# Patient Record
Sex: Male | Born: 1950
Health system: Southern US, Community
[De-identification: ages and names within clinical notes are randomized; demographics above are authoritative.]

## PROBLEM LIST (undated history)

## (undated) DIAGNOSIS — K649 Unspecified hemorrhoids: Secondary | ICD-10-CM

## (undated) DIAGNOSIS — C801 Malignant (primary) neoplasm, unspecified: Secondary | ICD-10-CM

## (undated) DIAGNOSIS — J309 Allergic rhinitis, unspecified: Secondary | ICD-10-CM

## (undated) DIAGNOSIS — K219 Gastro-esophageal reflux disease without esophagitis: Secondary | ICD-10-CM

## (undated) HISTORY — DX: Gastro-esophageal reflux disease without esophagitis: K21.9

## (undated) HISTORY — PX: NO PAST SURGERIES: SHX2092

## (undated) HISTORY — DX: Unspecified hemorrhoids: K64.9

## (undated) HISTORY — DX: Allergic rhinitis, unspecified: J30.9

---

## 2010-09-19 ENCOUNTER — Inpatient Hospital Stay (INDEPENDENT_AMBULATORY_CARE_PROVIDER_SITE_OTHER)
Admission: RE | Admit: 2010-09-19 | Discharge: 2010-09-19 | Disposition: A | Discharge status: Home / Self Care | Payer: BC Managed Care – PPO | Source: Ambulatory Visit | Attending: Family Medicine | Admitting: Family Medicine

## 2010-09-19 DIAGNOSIS — J019 Acute sinusitis, unspecified: Secondary | ICD-10-CM

## 2014-05-02 ENCOUNTER — Emergency Department (HOSPITAL_COMMUNITY)
Admission: EM | Admit: 2014-05-02 | Discharge: 2014-05-02 | Disposition: A | Discharge status: Home / Self Care | Payer: PRIVATE HEALTH INSURANCE | Attending: Emergency Medicine | Admitting: Emergency Medicine

## 2014-05-02 ENCOUNTER — Encounter (HOSPITAL_COMMUNITY): Payer: Self-pay | Admitting: Emergency Medicine

## 2014-05-02 DIAGNOSIS — K648 Other hemorrhoids: Secondary | ICD-10-CM | POA: Insufficient documentation

## 2014-05-02 DIAGNOSIS — K644 Residual hemorrhoidal skin tags: Secondary | ICD-10-CM | POA: Diagnosis not present

## 2014-05-02 DIAGNOSIS — K611 Rectal abscess: Secondary | ICD-10-CM | POA: Diagnosis present

## 2014-05-02 DIAGNOSIS — K6289 Other specified diseases of anus and rectum: Secondary | ICD-10-CM

## 2014-05-02 MED ORDER — HYDROCORTISONE ACETATE 25 MG RE SUPP: 25.0000 mg | Freq: Two times a day (BID) | RECTAL | Status: DC

## 2014-05-02 MED ORDER — HYDROCODONE-ACETAMINOPHEN 5-325 MG PO TABS
1.0000 | ORAL_TABLET | Freq: Once | ORAL | Status: AC
  Administered 2014-05-02: 1 via ORAL
  Filled 2014-05-02: qty 1

## 2014-05-02 MED ORDER — LIDOCAINE HCL 2 % EX GEL
1.0000 "application " | Freq: Once | CUTANEOUS | Status: AC
  Administered 2014-05-02: 1 via TOPICAL
  Filled 2014-05-02: qty 20

## 2014-05-02 MED ORDER — POLYETHYLENE GLYCOL 3350 17 G PO PACK: 17.0000 g | PACK | Freq: Every day | ORAL | Status: DC

## 2014-05-02 MED ORDER — DOCUSATE SODIUM 100 MG PO CAPS: 100.0000 mg | ORAL_CAPSULE | Freq: Two times a day (BID) | ORAL | Status: DC

## 2014-05-02 NOTE — ED Notes (Signed)
Pt c/o abscess near rectum area x 1 week with pain

## 2014-05-02 NOTE — ED Provider Notes (Signed)
CSN: [id number]     Arrival date & time 05/02/14  0903 History   None    Chief Complaint  Patient presents with  . Abscess     (Consider location/radiation/quality/duration/timing/severity/associated sxs/prior Treatment) HPI Comments: Hunter Walker is a 63 y.o. male with no PMHx, who presents to the ED with complaints of rectal "boil" x6 days which has progressively worsened. He reports the pain is 5/10 sharp constant nonradiating pain located on the right aspect of his anus, worse with pressure to the area such as sitting, and improved slightly with Vaseline and peroxide. He reports that last night the pain began to increase, which prompted him to seek help today. He reports that the pain also increases with having bowel movements, and endorses having hard stools with occasional red streaks of blood. Denies any fevers, chills, fluctuance, drainage/weeping, warmth, red streaking of his skin, abdomen pain, nausea, vomiting, diarrhea, melena, known injury to this area, or prior abscess.  Patient is a 63 y.o. male presenting with abscess. The history is provided by the patient. No language interpreter was used.  Abscess Location:  Ano-genital Ano-genital abscess location:  Anus Size:  Quarter Abscess quality: induration and painful   Abscess quality: not draining, no fluctuance, no warmth and not weeping   Red streaking: no   Duration:  6 days Progression:  Worsening Pain details:    Quality:  Sharp   Severity:  Moderate (5/10)   Duration:  6 days   Timing:  Constant   Progression:  Worsening Chronicity:  New Context: not diabetes, not immunosuppression, not insect bite/sting and not skin injury   Relieved by: peroxide and vaseline. Exacerbated by: pressure. Ineffective treatments:  None tried Associated symptoms: no fever, no nausea and no vomiting   Risk factors: no hx of MRSA and no prior abscess     History reviewed. No pertinent past medical history. History reviewed. No  pertinent past surgical history. History reviewed. No pertinent family history. History  Substance Use Topics  . Smoking status: Never Smoker   . Smokeless tobacco: Not on file  . Alcohol Use: Yes     Comment: occ    Review of Systems  Constitutional: Negative for fever and chills.  Gastrointestinal: Positive for constipation, anal bleeding and rectal pain. Negative for nausea, vomiting, abdominal pain, diarrhea and blood in stool.  Genitourinary: Negative for dysuria and testicular pain.  Musculoskeletal: Negative for back pain and neck pain.  Skin:       +"boil"  Neurological: Negative for weakness and numbness.   10 Systems reviewed and are negative for acute change except as noted in the HPI.    Allergies  Review of patient's allergies indicates no known allergies.  Home Medications   Prior to Admission medications   Not on File   BP 142/93 mmHg  Pulse 86  Temp(Src) 98.1 F (36.7 C) (Oral)  Resp 20  Ht 6' (1.829 m)  Wt 170 lb (77.111 kg)  BMI 23.05 kg/m2  SpO2 97% Physical Exam  Constitutional: He is oriented to person, place, and time. Vital signs are normal. He appears well-developed and well-nourished.  Non-toxic appearance. No distress.  Afebrile, nontoxic  HENT:  Head: Normocephalic and atraumatic.  Mouth/Throat: Oropharynx is clear and moist and mucous membranes are normal.  Eyes: Conjunctivae and EOM are normal. Right eye exhibits no discharge. Left eye exhibits no discharge.  Neck: Normal range of motion. Neck supple.  Cardiovascular: Normal rate.   Pulmonary/Chest: Effort normal. No respiratory distress.  Abdominal: Normal appearance. He exhibits no distension.  Genitourinary: Prostate normal. Rectal exam shows external hemorrhoid, internal hemorrhoid and tenderness. Rectal exam shows no fissure, no mass and anal tone normal.     Large external hemorrhoid located to the R side of anal opening, unchanged with valsalva, exquisitely tender to palpation,  without erythema or warmth, no fluctuance, difficult to reduce due to pain. No fissures. Good anal tone. No rectal masses, small internal hemorrhoids palpated which do not prolapse with valsalva. Prostate WNL.  Musculoskeletal: Normal range of motion.  Neurological: He is alert and oriented to person, place, and time. He has normal strength. No sensory deficit.  Skin: Skin is warm, dry and intact. No rash noted.  Psychiatric: He has a normal mood and affect.  Nursing note and vitals reviewed.   ED Course  Procedures (including critical care time) Labs Review Labs Reviewed - No data to display  Imaging Review No results found.   EKG Interpretation None      MDM   Final diagnoses:  External prolapsed hemorrhoids  Anal or rectal pain    63 y.o. male with large external hemorrhoid. Will start on bowel regimen and give anusol. Will have him f/up with [facility] and give surgery f/up in case this area becomes larger or does not resolve with conservative measures. Doubt need for I&D at this time as it does not appear to be thrombosed. Vicodin given in ED for pain control while examining pt. Will have him use sitz baths and anusol for pain medicine at home. I explained the diagnosis and have given explicit precautions to return to the ER including for any other new or worsening symptoms. The patient understands and accepts the medical plan as it's been dictated and I have answered their questions. Discharge instructions concerning home care and prescriptions have been given. The patient is STABLE and is discharged to home in good condition.  BP 142/93 mmHg  Pulse 87  Temp(Src) 98.1 F (36.7 C) (Oral)  Resp 20  Ht 6' (1.829 m)  Wt 170 lb (77.111 kg)  BMI 23.05 kg/m2  SpO2 95%  Meds ordered this encounter  Medications  . HYDROcodone-acetaminophen (NORCO/VICODIN) 5-325 MG per tablet 1 tablet    Sig:   . ibuprofen (ADVIL,MOTRIN) 200 MG tablet    Sig: Take 400 mg by  mouth every 6 (six) hours as needed.  . lidocaine (XYLOCAINE) 2 % jelly 1 application    Sig:   . polyethylene glycol (MIRALAX / GLYCOLAX) packet    Sig: Take 17 g by mouth daily.    Dispense:  14 each    Refill:  0    Order Specific Question:  Supervising Provider    Answer:  Noemi Chapel D [[id number]]  . hydrocortisone (ANUSOL-HC) 25 MG suppository    Sig: Place 1 suppository (25 mg total) rectally 2 (two) times daily. For 10 days or until resolution of symptoms.    Dispense:  20 suppository    Refill:  0    Order Specific Question:  Supervising Provider    Answer:  Noemi Chapel D [[id number]]  . docusate sodium (COLACE) 100 MG capsule    Sig: Take 1 capsule (100 mg total) by mouth every 12 (twelve) hours.    Dispense:  60 capsule    Refill:  0    Order Specific Question:  Supervising Provider    Answer:  Noemi Chapel D [[id number]]       Patty Sermons Camprubi-Soms, PA-C 05/02/14  [place], MD 05/02/14 [phone]

## 2014-05-02 NOTE — ED Notes (Signed)
Pt comfortable with discharge and follow up instructions. Pt declines wheelchair, escorted to waiting area by this RN. Prescriptions x3. 

## 2014-05-02 NOTE — Discharge Instructions (Signed)
Perform sitz baths 4 times daily as directed below. Use miralax and colace to soften your stools. Use anusol as directed to help resolve this hemorrhoid. If this area does not improve within the next 5 days, or if it worsens, call the surgeon's office. If this area becomes warm, red, and draining pus or blood, or you develop high fevers, return to the ER. Follow up with [facility] in 3-5 days for ongoing management of your healthcare, and to follow up on this issue.    Hemorrhoids Hemorrhoids are puffy (swollen) veins around the rectum or anus. Hemorrhoids can cause pain, itching, bleeding, or irritation. HOME CARE  Eat foods with fiber, such as whole grains, beans, nuts, fruits, and vegetables. Ask your doctor about taking products with added fiber in them (fibersupplements).  Drink enough fluid to keep your pee (urine) clear or pale yellow.  Exercise often.  Go to the bathroom when you have the urge to poop. Do not wait.  Avoid straining to poop (bowel movement).  Keep the butt area dry and clean. Use wet toilet paper or moist paper towels.  Medicated creams and medicine inserted into the anus (anal suppository) may be used or applied as told.  Only take medicine as told by your doctor.  Take a warm water bath (sitz bath) for 15-20 minutes to ease pain. Do this 3-4 times a day.  Place ice packs on the area if it is tender or puffy. Use the ice packs between the warm water baths.  Put ice in a plastic bag.  Place a towel between your skin and the bag.  Leave the ice on for 15-20 minutes, 03-04 times a day.  Do not use a donut-shaped pillow or sit on the toilet for a long time. GET HELP RIGHT AWAY IF:   You have more pain that is not controlled by treatment or medicine.  You have bleeding that will not stop.  You have trouble or are unable to poop (bowel movement).  You have pain or puffiness outside the area of the hemorrhoids. MAKE SURE YOU:   Understand  these instructions.  Will watch your condition.  Will get help right away if you are not doing well or get worse. Document Released: 02/27/2008 Document Revised: 05/06/2012 Document Reviewed: 03/31/2012 [facility] Patient Information 2015 [place], [place]. This information is not intended to replace advice given to you by your health care provider. Make sure you discuss any questions you have with your health care provider.

## 2015-03-20 ENCOUNTER — Encounter (HOSPITAL_COMMUNITY): Payer: Self-pay | Admitting: *Deleted

## 2015-03-20 ENCOUNTER — Emergency Department (HOSPITAL_COMMUNITY)
Admission: EM | Admit: 2015-03-20 | Discharge: 2015-03-20 | Disposition: A | Discharge status: Home / Self Care | Payer: PRIVATE HEALTH INSURANCE | Attending: Emergency Medicine | Admitting: Emergency Medicine

## 2015-03-20 DIAGNOSIS — K6289 Other specified diseases of anus and rectum: Secondary | ICD-10-CM | POA: Diagnosis present

## 2015-03-20 DIAGNOSIS — K644 Residual hemorrhoidal skin tags: Secondary | ICD-10-CM | POA: Insufficient documentation

## 2015-03-20 DIAGNOSIS — Z79899 Other long term (current) drug therapy: Secondary | ICD-10-CM | POA: Diagnosis not present

## 2015-03-20 DIAGNOSIS — Z87891 Personal history of nicotine dependence: Secondary | ICD-10-CM | POA: Diagnosis not present

## 2015-03-20 DIAGNOSIS — K5900 Constipation, unspecified: Secondary | ICD-10-CM | POA: Diagnosis not present

## 2015-03-20 MED ORDER — HYDROCORTISONE 2.5 % RE CREA: TOPICAL_CREAM | RECTAL | Status: DC

## 2015-03-20 MED ORDER — DOCUSATE SODIUM 100 MG PO CAPS: 100.0000 mg | ORAL_CAPSULE | Freq: Two times a day (BID) | ORAL | Status: DC

## 2015-03-20 NOTE — ED Provider Notes (Signed)
CSN: [id number]     Arrival date & time 03/20/15  0740 History   First MD Initiated Contact with Patient 03/20/15 0759     Chief Complaint  Patient presents with  . Hemorrhoids     (Consider location/radiation/quality/duration/timing/severity/associated sxs/prior Treatment) HPI Comments: 64 year old male with history of hemorrhoids who presents with hemorrhoid pain. Patient presented here 1 year ago with hemorrhoid pain and has not had problems since approximately one week ago, when he began having rectal pain associated with bowel movements and straining and worse when he sits down. He has felt a "bump" near his anus. He reports some blood on the toilet paper after bowel movements. 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 He does sit a lot while driving to and from work. He has some generalized abdominal discomfort related to the constipation. No fevers or weight loss. He has taken ibuprofen 1 time, MiraLAX once, and used Preparation H without relief.  The history is provided by the patient.    History reviewed. No pertinent past medical history. History reviewed. No pertinent past surgical history. History reviewed. No pertinent family history. Social History  Substance Use Topics  . Smoking status: Former Smoker -- 1.00 packs/day for 10 years    Types: Cigarettes  . Smokeless tobacco: None  . Alcohol Use: Yes     Comment: occ    Review of Systems 10 Systems reviewed and are negative for acute change except as noted in the HPI.    Allergies  Review of patient's allergies indicates no known allergies.  Home Medications   Prior to Admission medications   Medication Sig Start Date End Date Taking? Authorizing Provider  ibuprofen (ADVIL,MOTRIN) 200 MG tablet Take 400 mg by mouth every 8 (eight) hours as needed for fever, headache, mild pain, moderate pain or cramping.    Yes Historical Provider, MD   phenylephrine-shark liver oil-mineral oil-petrolatum (PREPARATION H) 0.25-3-14-71.9 % rectal ointment Place 1 application rectally 2 (two) times daily as needed for hemorrhoids.   Yes Historical Provider, MD  polyethylene glycol (MIRALAX / GLYCOLAX) packet Take 17 g by mouth daily. Patient taking differently: Take 17 g by mouth daily as needed for mild constipation.  05/02/14  Yes Mercedes Camprubi-Soms, PA-C  shark liver oil-cocoa butter (PREPARATION H) 0.25-3-85.5 % suppository Place 1 suppository rectally as needed for hemorrhoids.   Yes Historical Provider, MD  docusate sodium (COLACE) 100 MG capsule Take 1 capsule (100 mg total) by mouth every 12 (twelve) hours. 03/20/15   Sharlett Iles, MD  hydrocortisone (ANUSOL-HC) 2.5 % rectal cream Apply small amount of medication on hemorrhoid 2-4 times daily as needed for comfort 03/20/15   Wenda Overland Ilee Randleman, MD   BP 138/85 mmHg  Pulse 78  Temp(Src) 97.7 F (36.5 C) (Oral)  Resp 16  SpO2 99% Physical Exam  Constitutional: He is oriented to person, place, and time. He appears well-developed and well-nourished. No distress.  HENT:  Head: Normocephalic and atraumatic.  Moist mucous membranes  Eyes: Conjunctivae are normal. Pupils are equal, round, and reactive to light.  Neck: Neck supple.  Cardiovascular: Normal rate, regular rhythm and normal heart sounds.   No murmur heard. Pulmonary/Chest: Effort normal and breath sounds normal.  Abdominal: Soft. Bowel sounds are normal. He exhibits no distension. There is no tenderness.  Genitourinary:  1cm non-thrombosed, non-bleeding external hemorroid  Musculoskeletal: He exhibits no edema.  Neurological: He is alert and  oriented to person, place, and time.  Fluent speech  Skin: Skin is warm and dry.  Psychiatric: He has a normal mood and affect. Judgment normal.  Nursing note and vitals reviewed.  Chaperone was present during exam.  ED Course  Procedures (including critical care  time) Labs Review Labs Reviewed - No data to display   MDM   Final diagnoses:  External hemorrhoids  Constipation, unspecified constipation type   64 year old male with history of hemorrhoids and constipation who presents with 1 week of worsening hemorrhoid pain associated with straining and worse with sitting down. Patient well-appearing with no abdominal tenderness on exam. He had a small, nonthrombosed, nonbleeding external hemorrhoid. Instructed on supportive care including MiraLAX and Colace daily, use of donut pillow, and use of anusol, tucks pads for symptom relief. Also instructed on fiber and increasing water intake. Instructed to establish care with a primary care provider for follow-up if any complications or ongoing symptoms. Patient voiced understanding and was discharged in satisfactory condition.  Sharlett Iles, MD 03/20/15 [phone]

## 2015-03-20 NOTE — Discharge Instructions (Signed)

## 2015-03-20 NOTE — ED Notes (Addendum)
Pt pt reports bump near rectum. Was seen last Nov 2015 for same complaint at [facility], was told he had a hemorrhoid. Reports increased pain x1 week, sometimes blood on toilet paper after bowel movement and straining. Pain 7/10.

## 2015-05-15 ENCOUNTER — Emergency Department (HOSPITAL_COMMUNITY)
Admission: EM | Admit: 2015-05-15 | Discharge: 2015-05-15 | Disposition: A | Discharge status: Home / Self Care | Payer: PRIVATE HEALTH INSURANCE | Attending: Emergency Medicine | Admitting: Emergency Medicine

## 2015-05-15 ENCOUNTER — Emergency Department (HOSPITAL_COMMUNITY): Payer: PRIVATE HEALTH INSURANCE

## 2015-05-15 ENCOUNTER — Encounter (HOSPITAL_COMMUNITY): Payer: Self-pay

## 2015-05-15 DIAGNOSIS — R112 Nausea with vomiting, unspecified: Secondary | ICD-10-CM | POA: Insufficient documentation

## 2015-05-15 DIAGNOSIS — R197 Diarrhea, unspecified: Secondary | ICD-10-CM | POA: Insufficient documentation

## 2015-05-15 DIAGNOSIS — K6389 Other specified diseases of intestine: Secondary | ICD-10-CM

## 2015-05-15 DIAGNOSIS — Z79899 Other long term (current) drug therapy: Secondary | ICD-10-CM | POA: Insufficient documentation

## 2015-05-15 DIAGNOSIS — R1110 Vomiting, unspecified: Secondary | ICD-10-CM

## 2015-05-15 DIAGNOSIS — Z87891 Personal history of nicotine dependence: Secondary | ICD-10-CM | POA: Insufficient documentation

## 2015-05-15 LAB — CBC
HEMATOCRIT: 36.8 % — AB (ref 39.0–52.0)
HEMOGLOBIN: 11.6 g/dL — AB (ref 13.0–17.0)
MCH: 27.1 pg (ref 26.0–34.0)
MCHC: 31.5 g/dL (ref 30.0–36.0)
MCV: 86 fL (ref 78.0–100.0)
Platelets: 304 10*3/uL (ref 150–400)
RBC: 4.28 MIL/uL (ref 4.22–5.81)
RDW: 15.3 % (ref 11.5–15.5)
WBC: 5.6 10*3/uL (ref 4.0–10.5)

## 2015-05-15 LAB — URINALYSIS, ROUTINE W REFLEX MICROSCOPIC
Bilirubin Urine: NEGATIVE
Glucose, UA: NEGATIVE mg/dL
Hgb urine dipstick: NEGATIVE
Ketones, ur: NEGATIVE mg/dL
LEUKOCYTES UA: NEGATIVE
Nitrite: NEGATIVE
PH: 5.5 (ref 5.0–8.0)
Protein, ur: NEGATIVE mg/dL
Specific Gravity, Urine: 1.02 (ref 1.005–1.030)

## 2015-05-15 LAB — COMPREHENSIVE METABOLIC PANEL
ALT: 15 U/L — ABNORMAL LOW (ref 17–63)
AST: 22 U/L (ref 15–41)
Albumin: 3.8 g/dL (ref 3.5–5.0)
Alkaline Phosphatase: 62 U/L (ref 38–126)
Anion gap: 6 (ref 5–15)
BUN: 16 mg/dL (ref 6–20)
CHLORIDE: 101 mmol/L (ref 101–111)
CO2: 29 mmol/L (ref 22–32)
Calcium: 8.7 mg/dL — ABNORMAL LOW (ref 8.9–10.3)
Creatinine, Ser: 1.28 mg/dL — ABNORMAL HIGH (ref 0.61–1.24)
GFR calc Af Amer: >60 mL/min (ref >60)
GFR, EST NON AFRICAN AMERICAN: 58 mL/min — AB (ref >60)
Glucose, Bld: 119 mg/dL — ABNORMAL HIGH (ref 65–99)
POTASSIUM: 4.1 mmol/L (ref 3.5–5.1)
Sodium: 136 mmol/L (ref 135–145)
Total Bilirubin: 0.5 mg/dL (ref 0.3–1.2)
Total Protein: 7.7 g/dL (ref 6.5–8.1)

## 2015-05-15 LAB — LIPASE, BLOOD: LIPASE: 21 U/L (ref 11–51)

## 2015-05-15 LAB — MAGNESIUM: Magnesium: 2.2 mg/dL (ref 1.7–2.4)

## 2015-05-15 MED ORDER — SODIUM CHLORIDE 0.9 % IV BOLUS (SEPSIS)
1000.0000 mL | Freq: Once | INTRAVENOUS | Status: AC
  Administered 2015-05-15: 1000 mL via INTRAVENOUS

## 2015-05-15 MED ORDER — IOHEXOL 300 MG/ML  SOLN
50.0000 mL | Freq: Once | INTRAMUSCULAR | Status: AC | PRN
  Administered 2015-05-15: 50 mL via ORAL

## 2015-05-15 MED ORDER — IOHEXOL 300 MG/ML  SOLN
100.0000 mL | Freq: Once | INTRAMUSCULAR | Status: AC | PRN
  Administered 2015-05-15: 100 mL via INTRAVENOUS

## 2015-05-15 MED ORDER — LIDOCAINE VISCOUS 2 % MT SOLN
15.0000 mL | Freq: Once | OROMUCOSAL | Status: AC
  Administered 2015-05-15: 15 mL via OROMUCOSAL
  Filled 2015-05-15: qty 15

## 2015-05-15 MED ORDER — ALUM & MAG HYDROXIDE-SIMETH 200-200-20 MG/5ML PO SUSP
15.0000 mL | Freq: Once | ORAL | Status: AC
  Administered 2015-05-15: 15 mL via ORAL
  Filled 2015-05-15: qty 30

## 2015-05-15 MED ORDER — ONDANSETRON 4 MG PO TBDP: ORAL_TABLET | ORAL | Status: DC

## 2015-05-15 MED ORDER — ONDANSETRON HCL 4 MG/2ML IJ SOLN
4.0000 mg | Freq: Once | INTRAMUSCULAR | Status: AC
Start: 2015-05-15 — End: 2015-05-15
  Administered 2015-05-15: 4 mg via INTRAVENOUS
  Filled 2015-05-15: qty 2

## 2015-05-15 NOTE — Discharge Instructions (Signed)
Follow up with your PCP.  Return for inability to eat or drink, persistent fever >5 days, sudden worsening abdominal pain. ° °

## 2015-05-15 NOTE — ED Provider Notes (Signed)
CSN: [id number]     Arrival date & time 05/15/15  [id number] History   First MD Initiated Contact with Patient 05/15/15 0914     Chief Complaint  Patient presents with  . Emesis  . Diarrhea     (Consider location/radiation/quality/duration/timing/severity/associated sxs/prior Treatment) Patient is a 64 y.o. male presenting with general illness. The history is provided by the patient.  Illness Severity:  Moderate Onset quality:  Gradual Duration:  2 days Timing:  Constant Progression:  Worsening Chronicity:  New Associated symptoms: diarrhea, nausea and vomiting   Associated symptoms: no abdominal pain, no chest pain, no congestion, no fever, no headaches, no myalgias, no rash and no shortness of breath    64 yo M with a cc of n/v/d.  This started about two days ago.  Wife recently diagnosed with norovirus.  Patient having similar symptoms. Denies fever, chills.  Able to eat and drink without difficulty.  History reviewed. No pertinent past medical history. History reviewed. No pertinent past surgical history. History reviewed. No pertinent family history. Social History  Substance Use Topics  . Smoking status: Former Smoker -- 1.00 packs/day for 10 years    Types: Cigarettes  . Smokeless tobacco: None  . Alcohol Use: Yes     Comment: occ    Review of Systems  Constitutional: Negative for fever and chills.  HENT: Negative for congestion and facial swelling.   Eyes: Negative for discharge and visual disturbance.  Respiratory: Negative for shortness of breath.   Cardiovascular: Negative for chest pain and palpitations.  Gastrointestinal: Positive for nausea, vomiting and diarrhea. Negative for abdominal pain.  Musculoskeletal: Negative for myalgias and arthralgias.  Skin: Negative for color change and rash.  Neurological: Negative for tremors, syncope and headaches.  Psychiatric/Behavioral: Negative for confusion and dysphoric mood.      Allergies  Review of patient's  allergies indicates no known allergies.  Home Medications   Prior to Admission medications   Medication Sig Start Date End Date Taking? Authorizing Provider  docusate sodium (COLACE) 100 MG capsule Take 1 capsule (100 mg total) by mouth every 12 (twelve) hours. 03/20/15  Yes Sharlett Iles, MD  hydrocortisone (ANUSOL-HC) 2.5 % rectal cream Apply small amount of medication on hemorrhoid 2-4 times daily as needed for comfort 03/20/15  Yes Sharlett Iles, MD  ibuprofen (ADVIL,MOTRIN) 200 MG tablet Take 400 mg by mouth every 8 (eight) hours as needed for fever, headache, mild pain, moderate pain or cramping.    Yes Historical Provider, MD  ondansetron (ZOFRAN ODT) 4 MG disintegrating tablet 4mg  ODT q4 hours prn nausea/vomit 05/15/15   Deno Etienne, DO  polyethylene glycol (MIRALAX / GLYCOLAX) packet Take 17 g by mouth daily. Patient not taking: Reported on 05/15/2015 05/02/14   Mercedes Camprubi-Soms, PA-C   BP 114/87 mmHg  Pulse 83  Temp(Src) 98.5 F (36.9 C) (Oral)  Resp 18  Ht 6' (1.829 m)  Wt 182 lb (82.555 kg)  BMI 24.68 kg/m2  SpO2 98% Physical Exam  Constitutional: He is oriented to person, place, and time. He appears well-developed and well-nourished.  HENT:  Head: Normocephalic and atraumatic.  Eyes: EOM are normal. Pupils are equal, round, and reactive to light.  Neck: Normal range of motion. Neck supple. No JVD present.  Cardiovascular: Normal rate and regular rhythm.  Exam reveals no gallop and no friction rub.   No murmur heard. Pulmonary/Chest: No respiratory distress. He has no wheezes.  Abdominal: He exhibits no distension. There is no tenderness. There is  no rebound and no guarding.  Benign abdomen  Musculoskeletal: Normal range of motion.  Neurological: He is alert and oriented to person, place, and time.  Skin: No rash noted. No pallor.  Psychiatric: He has a normal mood and affect. His behavior is normal.  Nursing note and vitals reviewed.   ED Course   Procedures (including critical care time) Labs Review Labs Reviewed  COMPREHENSIVE METABOLIC PANEL - Abnormal; Notable for the following:    Glucose, Bld 119 (*)    Creatinine, Ser 1.28 (*)    Calcium 8.7 (*)    ALT 15 (*)    GFR calc non Af Amer 58 (*)    All other components within normal limits  CBC - Abnormal; Notable for the following:    Hemoglobin 11.6 (*)    HCT 36.8 (*)    All other components within normal limits  LIPASE, BLOOD  URINALYSIS, ROUTINE W REFLEX MICROSCOPIC (NOT AT [facility])  MAGNESIUM    Imaging Review Ct Abdomen Pelvis W Contrast  05/15/2015  CLINICAL DATA:  Mid abdominal pain with nausea, vomiting and diarrhea since last Saturday night. EXAM: CT ABDOMEN AND PELVIS WITH CONTRAST TECHNIQUE: Multidetector CT imaging of the abdomen and pelvis was performed using the standard protocol following bolus administration of intravenous contrast. CONTRAST:  15mL OMNIPAQUE IOHEXOL 300 MG/ML  SOLN COMPARISON:  None. FINDINGS: Lower chest: Mild dependent atelectasis at both lung bases. No confluent airspace opacity, pleural or pericardial effusion. Hepatobiliary: 6 mm low-density lesion posteriorly in the right hepatic lobe on image number 18 is likely a cyst, but too small to optimally characterize. No suspicious hepatic findings. No evidence of gallstones, gallbladder wall thickening or biliary dilatation. Pancreas: The pancreatic duct appears mildly prominent, measuring up to 5 mm in diameter. No evidence of pancreatic mass, surrounding inflammation or pancreas divisum. Spleen: Normal in size without focal abnormality. Adrenals/Urinary Tract: Both adrenal glands appear normal. The kidneys appear normal without evidence of urinary tract calculus, suspicious lesion or hydronephrosis. No bladder abnormalities are seen. Stomach/Bowel: The stomach, small bowel and proximal colon demonstrate no abnormalities. The appendix appears normal. There is irregular wall thickening at the  rectosigmoid junction extending over approximately 6 cm. There is no surrounding inflammatory change, and this finding is worrisome for infiltrating tumor. At least 1 prominent perirectal lymph node is noted, measuring 6 mm on image 76. Vascular/Lymphatic: As above, there is a prominent perirectal lymph node on the left. No other enlarged abdominal pelvic lymph nodes identified. There is mild atherosclerosis of the aorta, its branches and the iliac arteries. Reproductive: The prostate gland is moderately enlarged, measuring up to 6.2 x 4.7 cm transverse. Other: Small umbilical hernia containing only fat. Musculoskeletal: No acute or significant osseous findings. No evidence of metastatic disease. There are mild lumbar spine degenerative changes. IMPRESSION: 1. There is concern of an infiltrating mass involving the rectosigmoid colon. This has appearance more worrisome for tumor (colon cancer) than focal colitis. Sigmoidoscopy recommended. 2. Single adjacent prominent left perirectal lymph node. No distant metastases identified. No evidence of bowel obstruction. 3. Mild prominence of the pancreatic duct, possibly postinflammatory. No other pancreatic abnormalities identified. 4. Mild atherosclerosis. Electronically Signed   By: Richardean Sale M.D.   On: 05/15/2015 12:58   I have personally reviewed and evaluated these images and lab results as part of my medical decision-making.   EKG Interpretation None      MDM   Final diagnoses:  Colonic mass  Vomiting and diarrhea  64 yo M with cc of n/v/d.  Likely norovirus based on hx.  Patient feels that something must be wrong and is requesting a CT scan. Labs unremarkable.  CT scan with concern for colon ca.  Given follow up for GI.     I have discussed the diagnosis/risks/treatment options with the patient and believe the pt to be eligible for discharge home to follow-up with GI. We also discussed returning to the ED immediately if new or worsening sx  occur. We discussed the sx which are most concerning (e.g., sudden worsening pain, fever, inability to tolerate by mouth) that necessitate immediate return. Medications administered to the patient during their visit and any new prescriptions provided to the patient are listed below.  Medications given during this visit Medications  sodium chloride 0.9 % bolus 1,000 mL (0 mLs Intravenous Stopped 05/15/15 1214)  ondansetron (ZOFRAN) injection 4 mg (4 mg Intravenous Given 05/15/15 1014)  alum & mag hydroxide-simeth (MAALOX/MYLANTA) 200-200-20 MG/5ML suspension 15 mL (15 mLs Oral Given 05/15/15 1017)  lidocaine (XYLOCAINE) 2 % viscous mouth solution 15 mL (15 mLs Mouth/Throat Given 05/15/15 1018)  iohexol (OMNIPAQUE) 300 MG/ML solution 50 mL (50 mLs Oral Contrast Given 05/15/15 0945)  iohexol (OMNIPAQUE) 300 MG/ML solution 100 mL (100 mLs Intravenous Contrast Given 05/15/15 1225)    Discharge Medication List as of 05/15/2015  1:31 PM    START taking these medications   Details  ondansetron (ZOFRAN ODT) 4 MG disintegrating tablet 4mg  ODT q4 hours prn nausea/vomit, Print        The patient appears reasonably screen and/or stabilized for discharge and I doubt any other medical condition or other EMC requiring further screening, evaluation, or treatment in the ED at this time prior to discharge.      Deno Etienne, DO 05/15/15 2024

## 2015-05-15 NOTE — ED Notes (Signed)
Patient transported to CT 

## 2015-05-15 NOTE — ED Notes (Signed)
Pt with n/v/d since last Saturday night.  Pt wife in hospital for stomach virus.  Pt feels nauseated, weak, hot/cold.  Unknown for fever.

## 2015-05-16 ENCOUNTER — Telehealth: Payer: Self-pay

## 2015-05-16 ENCOUNTER — Encounter (HOSPITAL_COMMUNITY): Payer: Self-pay | Admitting: *Deleted

## 2015-05-16 ENCOUNTER — Emergency Department (HOSPITAL_COMMUNITY)
Admission: EM | Admit: 2015-05-16 | Discharge: 2015-05-16 | Disposition: A | Discharge status: Home / Self Care | Payer: PRIVATE HEALTH INSURANCE | Attending: Emergency Medicine | Admitting: Emergency Medicine

## 2015-05-16 DIAGNOSIS — K6389 Other specified diseases of intestine: Secondary | ICD-10-CM | POA: Diagnosis not present

## 2015-05-16 DIAGNOSIS — R1013 Epigastric pain: Secondary | ICD-10-CM

## 2015-05-16 DIAGNOSIS — Z79899 Other long term (current) drug therapy: Secondary | ICD-10-CM | POA: Insufficient documentation

## 2015-05-16 DIAGNOSIS — Z87891 Personal history of nicotine dependence: Secondary | ICD-10-CM | POA: Diagnosis not present

## 2015-05-16 LAB — COMPREHENSIVE METABOLIC PANEL
ALBUMIN: 3.7 g/dL (ref 3.5–5.0)
ALK PHOS: 58 U/L (ref 38–126)
ALT: 14 U/L — ABNORMAL LOW (ref 17–63)
AST: 24 U/L (ref 15–41)
Anion gap: 9 (ref 5–15)
BILIRUBIN TOTAL: 0.4 mg/dL (ref 0.3–1.2)
BUN: 13 mg/dL (ref 6–20)
CO2: 26 mmol/L (ref 22–32)
Calcium: 8.8 mg/dL — ABNORMAL LOW (ref 8.9–10.3)
Chloride: 103 mmol/L (ref 101–111)
Creatinine, Ser: 1.19 mg/dL (ref 0.61–1.24)
Glucose, Bld: 100 mg/dL — ABNORMAL HIGH (ref 65–99)
Potassium: 3.5 mmol/L (ref 3.5–5.1)
Sodium: 138 mmol/L (ref 135–145)
Total Protein: 7.5 g/dL (ref 6.5–8.1)

## 2015-05-16 LAB — CBC WITH DIFFERENTIAL/PLATELET
BASOS ABS: 0 10*3/uL (ref 0.0–0.1)
Basophils Relative: 0 %
Eosinophils Absolute: 0.1 10*3/uL (ref 0.0–0.7)
Eosinophils Relative: 2 %
HEMATOCRIT: 35.2 % — AB (ref 39.0–52.0)
Hemoglobin: 11.1 g/dL — ABNORMAL LOW (ref 13.0–17.0)
LYMPHS PCT: 33 %
Lymphs Abs: 1.6 10*3/uL (ref 0.7–4.0)
MCH: 27.5 pg (ref 26.0–34.0)
MCHC: 31.5 g/dL (ref 30.0–36.0)
MCV: 87.1 fL (ref 78.0–100.0)
MONO ABS: 0.4 10*3/uL (ref 0.1–1.0)
Monocytes Relative: 9 %
NEUTROS ABS: 2.8 10*3/uL (ref 1.7–7.7)
Neutrophils Relative %: 56 %
Platelets: 277 10*3/uL (ref 150–400)
RBC: 4.04 MIL/uL — ABNORMAL LOW (ref 4.22–5.81)
RDW: 15.1 % (ref 11.5–15.5)
WBC: 5 10*3/uL (ref 4.0–10.5)

## 2015-05-16 LAB — LIPASE, BLOOD: LIPASE: 22 U/L (ref 11–51)

## 2015-05-16 MED ORDER — SUCRALFATE 1 G PO TABS
1.0000 g | ORAL_TABLET | Freq: Once | ORAL | Status: AC
  Administered 2015-05-16: 1 g via ORAL
  Filled 2015-05-16: qty 1

## 2015-05-16 MED ORDER — SODIUM CHLORIDE 0.9 % IV BOLUS (SEPSIS): 1000.0000 mL | Freq: Once | INTRAVENOUS | Status: DC

## 2015-05-16 MED ORDER — SUCRALFATE 1 GM/10ML PO SUSP: 1.0000 g | Freq: Three times a day (TID) | ORAL | Status: DC

## 2015-05-16 MED ORDER — ONDANSETRON HCL 4 MG/2ML IJ SOLN
4.0000 mg | Freq: Once | INTRAMUSCULAR | Status: DC
  Filled 2015-05-16: qty 2

## 2015-05-16 MED ORDER — FAMOTIDINE 20 MG PO TABS
20.0000 mg | ORAL_TABLET | Freq: Once | ORAL | Status: AC
  Administered 2015-05-16: 20 mg via ORAL
  Filled 2015-05-16: qty 1

## 2015-05-16 MED ORDER — OXYCODONE-ACETAMINOPHEN 5-325 MG PO TABS: ORAL_TABLET | ORAL | Status: DC

## 2015-05-16 MED ORDER — ALUM & MAG HYDROXIDE-SIMETH 200-200-20 MG/5ML PO SUSP
15.0000 mL | Freq: Once | ORAL | Status: DC
  Filled 2015-05-16: qty 30

## 2015-05-16 MED ORDER — FAMOTIDINE 20 MG PO TABS: 20.0000 mg | ORAL_TABLET | Freq: Two times a day (BID) | ORAL | Status: DC

## 2015-05-16 MED ORDER — OXYCODONE-ACETAMINOPHEN 5-325 MG PO TABS
2.0000 | ORAL_TABLET | Freq: Once | ORAL | Status: AC
  Administered 2015-05-16: 2 via ORAL
  Filled 2015-05-16: qty 2

## 2015-05-16 NOTE — ED Provider Notes (Signed)
MSE was initiated and I personally evaluated the patient and placed orders (if any) at  2:53 PM on May 16, 2015.  The patient appears stable so that the remainder of the MSE may be completed by another provider.  Ongoing n/v/d. Will hydrate and provide zofran IV at this time. Well appearing,. nontoxic  Jola Schmidt, MD 05/16/15 1453

## 2015-05-16 NOTE — Telephone Encounter (Signed)
Left message for patient to call back  

## 2015-05-16 NOTE — Telephone Encounter (Signed)
-----   Message from Gatha Mayer, MD sent at 05/16/2015  9:40 AM EST ----- Regarding: needs appt soon Seen in ED 12/12 and had a sigmoid mass on CT so was advised f/u US   Please contact him and arrange an appt unless he is getting one somewhere else  Perhaps APP can see this week or next  I am willing to scope him at hosp Friday if that is needed

## 2015-05-16 NOTE — ED Notes (Addendum)
Pt complains of left upper quadrant abdominal pain, nausea, vomiting since Saturday. Pt was seen yesterday at the ED. Pt states he called to follow up with GI specialist, has appointment tomorrow at [facility], but pt states he could not wait due to pain. Pt states he took TUMs last night, which provided some relief.

## 2015-05-16 NOTE — Progress Notes (Addendum)
Pt confirmed with Cm he does not have apcp but has gateway health coverage  C/o abdominal pain  [facility] ED CM noted pt with coverage but no pcp listed Spoke with pt who confirms no pcp  [facility] ED CM spoke with pt on how to obtain an in network pcp with insurance coverage via the customer service number or web site  Cm reviewed ED level of care for crisis/emergent services and community pcp level of care to manage continuous or chronic medical concerns.  The pt voiced understanding CM encouraged pt and discussed pt's responsibility to verify with pt's insurance carrier that any recommended medical provider offered by any emergency room or a hospital provider is within the carrier's network. The pt voiced understanding   Pt given a 9 page list of in network providers for gateway health for zip code [phone] to assist him with finding a MD for f/u care     Entered in EPIC  please use the list of in network providers given to you for gateway health 9 page list of gateway health providers

## 2015-05-16 NOTE — ED Provider Notes (Signed)
CSN: [id number]     Arrival date & time 05/16/15  1249 History   First MD Initiated Contact with Patient 05/16/15 1450     Chief Complaint  Patient presents with  . Abdominal Pain  . Nausea     (Consider location/radiation/quality/duration/timing/severity/associated sxs/prior Treatment) HPI   Blood pressure 127/87, pulse 69, temperature 98.4 F (36.9 C), temperature source Oral, resp. rate 18, SpO2 100 %.  Hunter Walker is a 64 y.o. male complaining of 6 out of 10 epigastric pain with associated bloody diarrhea. Patient was seen for nausea vomiting diarrhea yesterday, his wife and sister-in-law are sick with similar. Unfortunately, CT showed rectal enteritis concerning for cancer. Patient has a preop appointment with [facility] gastroenterology tomorrow. He returns to the ED due to uncontrolled pain. He took a little bit of ibuprofen this morning with no relief. Emesis has resolved. He denies fever, chills, lightheadedness, syncope, chest pain, shortness of breath.  History reviewed. No pertinent past medical history. History reviewed. No pertinent past surgical history. No family history on file. Social History  Substance Use Topics  . Smoking status: Former Smoker -- 1.00 packs/day for 10 years    Types: Cigarettes  . Smokeless tobacco: None  . Alcohol Use: Yes     Comment: occ    Review of Systems  10 systems reviewed and found to be negative, except as noted in the HPI.   Allergies  Review of patient's allergies indicates no known allergies.  Home Medications   Prior to Admission medications   Medication Sig Start Date End Date Taking? Authorizing Provider  docusate sodium (COLACE) 100 MG capsule Take 1 capsule (100 mg total) by mouth every 12 (twelve) hours. 03/20/15  Yes Sharlett Iles, MD  hydrocortisone (ANUSOL-HC) 2.5 % rectal cream Apply small amount of medication on hemorrhoid 2-4 times daily as needed for comfort 03/20/15  Yes Sharlett Iles, MD   ibuprofen (ADVIL,MOTRIN) 200 MG tablet Take 400 mg by mouth every 8 (eight) hours as needed for fever, headache, mild pain, moderate pain or cramping.    Yes Historical Provider, MD  ondansetron (ZOFRAN ODT) 4 MG disintegrating tablet 4mg  ODT q4 hours prn nausea/vomit 05/15/15  Yes Deno Etienne, DO  polyethylene glycol (MIRALAX / GLYCOLAX) packet Take 17 g by mouth daily. Patient not taking: Reported on 05/15/2015 05/02/14   Mercedes Camprubi-Soms, PA-C   BP 127/87 mmHg  Pulse 69  Temp(Src) 98.4 F (36.9 C) (Oral)  Resp 18  SpO2 100% Physical Exam  Constitutional: He is oriented to person, place, and time. He appears well-developed and well-nourished. No distress.  HENT:  Head: Normocephalic and atraumatic.  Mouth/Throat: Oropharynx is clear and moist.  Eyes: Conjunctivae and EOM are normal. Pupils are equal, round, and reactive to light.  Neck: Normal range of motion.  Cardiovascular: Normal rate, regular rhythm and intact distal pulses.   Pulmonary/Chest: Effort normal and breath sounds normal.  Abdominal: Soft. He exhibits no distension and no mass. There is tenderness. There is no rebound and no guarding.  Mild epigastric tenderness palpation with no guarding or rebound.  Musculoskeletal: Normal range of motion.  Neurological: He is alert and oriented to person, place, and time.  Skin: He is not diaphoretic.  Psychiatric: He has a normal mood and affect.  Nursing note and vitals reviewed.   ED Course  Procedures (including critical care time) Labs Review Labs Reviewed  CBC WITH DIFFERENTIAL/PLATELET - Abnormal; Notable for the following:    RBC 4.04 (*)  Hemoglobin 11.1 (*)    HCT 35.2 (*)    All other components within normal limits  COMPREHENSIVE METABOLIC PANEL - Abnormal; Notable for the following:    Glucose, Bld 100 (*)    Calcium 8.8 (*)    ALT 14 (*)    All other components within normal limits  LIPASE, BLOOD  URINALYSIS, ROUTINE W REFLEX MICROSCOPIC (NOT AT  [facility])    Imaging Review Ct Abdomen Pelvis W Contrast  05/15/2015  CLINICAL DATA:  Mid abdominal pain with nausea, vomiting and diarrhea since last Saturday night. EXAM: CT ABDOMEN AND PELVIS WITH CONTRAST TECHNIQUE: Multidetector CT imaging of the abdomen and pelvis was performed using the standard protocol following bolus administration of intravenous contrast. CONTRAST:  126mL OMNIPAQUE IOHEXOL 300 MG/ML  SOLN COMPARISON:  None. FINDINGS: Lower chest: Mild dependent atelectasis at both lung bases. No confluent airspace opacity, pleural or pericardial effusion. Hepatobiliary: 6 mm low-density lesion posteriorly in the right hepatic lobe on image number 18 is likely a cyst, but too small to optimally characterize. No suspicious hepatic findings. No evidence of gallstones, gallbladder wall thickening or biliary dilatation. Pancreas: The pancreatic duct appears mildly prominent, measuring up to 5 mm in diameter. No evidence of pancreatic mass, surrounding inflammation or pancreas divisum. Spleen: Normal in size without focal abnormality. Adrenals/Urinary Tract: Both adrenal glands appear normal. The kidneys appear normal without evidence of urinary tract calculus, suspicious lesion or hydronephrosis. No bladder abnormalities are seen. Stomach/Bowel: The stomach, small bowel and proximal colon demonstrate no abnormalities. The appendix appears normal. There is irregular wall thickening at the rectosigmoid junction extending over approximately 6 cm. There is no surrounding inflammatory change, and this finding is worrisome for infiltrating tumor. At least 1 prominent perirectal lymph node is noted, measuring 6 mm on image 76. Vascular/Lymphatic: As above, there is a prominent perirectal lymph node on the left. No other enlarged abdominal pelvic lymph nodes identified. There is mild atherosclerosis of the aorta, its branches and the iliac arteries. Reproductive: The prostate gland is moderately enlarged, measuring  up to 6.2 x 4.7 cm transverse. Other: Small umbilical hernia containing only fat. Musculoskeletal: No acute or significant osseous findings. No evidence of metastatic disease. There are mild lumbar spine degenerative changes. IMPRESSION: 1. There is concern of an infiltrating mass involving the rectosigmoid colon. This has appearance more worrisome for tumor (colon cancer) than focal colitis. Sigmoidoscopy recommended. 2. Single adjacent prominent left perirectal lymph node. No distant metastases identified. No evidence of bowel obstruction. 3. Mild prominence of the pancreatic duct, possibly postinflammatory. No other pancreatic abnormalities identified. 4. Mild atherosclerosis. Electronically Signed   By: Richardean Sale M.D.   On: 05/15/2015 12:58   I have personally reviewed and evaluated these images and lab results as part of my medical decision-making.   EKG Interpretation None      MDM   Final diagnoses:  Epigastric pain  Colonic mass    Filed Vitals:   05/16/15 1258 05/16/15 1304  BP:  127/87  Pulse:  69  Temp: 98.4 F (36.9 C) 98.4 F (36.9 C)  TempSrc: Oral Oral  Resp:  18  SpO2:  100%    Medications  alum & mag hydroxide-simeth (MAALOX/MYLANTA) 200-200-20 MG/5ML suspension 15 mL (not administered)  ondansetron (ZOFRAN) injection 4 mg (not administered)  sodium chloride 0.9 % bolus 1,000 mL (not administered)  oxyCODONE-acetaminophen (PERCOCET/ROXICET) 5-325 MG per tablet 2 tablet (not administered)  sucralfate (CARAFATE) tablet 1 g (not administered)  famotidine (PEPCID) tablet 20  mg (not administered)    ARYK SHIFLET is 63 y.o. male presenting with persistent epigastric pain and bloody diarrhea. Abdominal exam is nonsurgical. No significant drop in hemoglobin since his visit yesterday. He has close follow-up with gastroenterology for the concerning colonic mass tomorrow.   Evaluation does not show pathology that would require ongoing emergent intervention or  inpatient treatment. Pt is hemodynamically stable and mentating appropriately. Discussed findings and plan with patient/guardian, who agrees with care plan. All questions answered. Return precautions discussed and outpatient follow up given.   New Prescriptions   FAMOTIDINE (PEPCID) 20 MG TABLET    Take 1 tablet (20 mg total) by mouth 2 (two) times daily.   OXYCODONE-ACETAMINOPHEN (PERCOCET/ROXICET) 5-325 MG TABLET    1 to 2 tabs PO q6hrs  PRN for pain   SUCRALFATE (CARAFATE) 1 GM/10ML SUSPENSION    Take 10 mLs (1 g total) by mouth 4 (four) times daily -  with meals and at bedtime.         Monico Blitz, PA-C 05/16/15 1525  Leonard Schwartz, MD 05/17/15 [phone]

## 2015-05-16 NOTE — Discharge Instructions (Signed)
Take percocet for breakthrough pain, do not drink alcohol, drive, care for children or do other critical tasks while taking percocet.  Do not hesitate to return to the emergency room for any new, worsening or concerning symptoms.  Please obtain primary care using resource guide below. Let them know that you were seen in the emergency room and that they will need to obtain records for further outpatient management.    Emergency Department Resource Guide 1) Find a Doctor and Pay Out of Pocket Although you won't have to find out who is covered by your insurance plan, it is a good idea to ask around and get recommendations. You will then need to call the office and see if the doctor you have chosen will accept you as a new patient and what types of options they offer for patients who are self-pay. Some doctors offer discounts or will set up payment plans for their patients who do not have insurance, but you will need to ask so you aren't surprised when you get to your appointment.  2) Contact Your Local Health Department Not all health departments have doctors that can see patients for sick visits, but many do, so it is worth a call to see if yours does. If you don't know where your local health department is, you can check in your phone book. The CDC also has a tool to help you locate your state's health department, and many state websites also have listings of all of their local health departments.  3) Find a [facility] If your illness is not likely to be very severe or complicated, you may want to try a walk in clinic. These are popping up all over the country in pharmacies, drugstores, and shopping centers. They're usually staffed by nurse practitioners or physician assistants that have been trained to treat common illnesses and complaints. They're usually fairly quick and inexpensive. However, if you have serious medical issues or chronic medical problems, these are probably not your best  option.  No Primary Care Doctor: - Call Health Connect at  [phone] - they can help you locate a primary care doctor that  accepts your insurance, provides certain services, etc. - Physician Referral Service- [phone]  Chronic Pain Problems: Organization         Address  Phone   Notes  [facility]  [phone] Patients need to be referred by their primary care doctor.   Medication Assistance: Organization         Address  Phone   Notes  [facility] Medication [facility] [address] [place], [place] [address] [phone] --Must be a resident of [facility] -- Must have NO insurance coverage whatsoever (no Medicaid/ Medicare, etc.) -- The pt. MUST have a primary care doctor that directs their care regularly and follows them in the community   [facility]  [phone]   [employer]  [phone]    Agencies that provide inexpensive medical care: Organization         Address  Phone   Notes  [place]  [phone]   [facility]    [phone]   [facility] [place], [place] [address] [phone]   [place] [phone]   Planned Parenthood    [phone]   [facility]    [phone]   Community Health and  [place] [address], [place] Phone:  [phone], Fax:  [phone] Hours of Operation:  9 am - 6 pm, M-F.  Also accepts Medicaid/Medicare and self-pay.  [facility] for [place], [address], [place] Phone: [phone], Fax: [phone]. Hours of Operation:  8:30 am - 5:30 pm, M-F.  Also accepts Medicaid and self-pay.  [facility] [place] [address], [place] Phone: [phone]   [place], [place], [place] [phone], Ext. [phone] Mondays & Thursdays: 7-9 AM.  First 15  patients are seen on a first come, first serve basis.    [place] Providers:  Organization         Address  Phone   Notes  [facility] [address],  [phone] Also accepts self-pay patients.  [facility] [address], [place]  [phone]   [place], [address], [place] [phone]   [facility] [address] [place] [phone]   Lucianne Lei [address], [place]   [phone] Only accepts [place] Access [place] patients after they have their name applied to their card.   Self-Pay (no insurance) in [facility]:  Organization         Address  Phone   Notes  Sickle Cell Patients, [facility] [place] [phone]   [facility] [place] [phone]   [facility] [place]  [place], [place],  [phone]   [facility]/Dr. Osei-Bonsu  [address] [place] or [address], [place] [phone] Phone number for both [place] and [place] locations is the same.  [facility] [address] [place] [phone]   [facility] [address] [place] or [address] [phone]   [facility] [address], [place] [phone], phone; [phone], fax Sees patients 1st and 3rd Saturday of every month.  Must not qualify for public or private insurance (i.e. Medicaid, Medicare, [place] Health Choice, Veterans' Benefits)  Household income should be no more than 200% of the poverty level The clinic cannot treat you if you are pregnant or think you are pregnant  Sexually transmitted diseases are not treated at the clinic.    Dental  Care: Organization         Address  Phone  Notes  [facility] Department of [facility] [place] [phone] Accepts children up to age 59 who are enrolled in [place] or [place]; pregnant women with a Medicaid card; and children who have applied for Medicaid or [place] Health Choice, but were declined, whose parents can pay a reduced fee at time of service.  [facility] Department of [facility]  [address], [place] [phone] Accepts children up to age 80 who are enrolled in [place] or [place]; pregnant women with a Medicaid card; and children who have applied for Medicaid or [place] Health Choice, but were declined, whose parents can pay a reduced fee at time of service.  [place] Adult [facility] Access PRO[facility]  [place],  [place] [phone] Patients are seen by appointment only. Walk-ins are not accepted. [place] will see patients 51 years of age and older. Monday - Tuesday (8am-5pm) Most Wednesdays (8:30-5pm) $30 per visit, cash only  [facility] Adult Dental Access PROGRAM  [address], [facility] [phone] Patients are seen by appointment only. Walk-ins are not accepted. [place] will see patients 14 years of age and older. One Wednesday Evening (Monthly: Volunteer Based).  $30 per visit, cash only  [place]  [phone] for adults; Children under age 72, call Graduate Pediatric Dentistry at [phone]. Children aged 20-14, please call [phone] to request a pediatric application.  Dental services are provided in all areas of dental care including fillings, crowns and bridges, complete and partial dentures, implants, gum treatment, root canals, and extractions. Preventive care is also provided. Treatment is provided to both adults and children. Patients are selected via a lottery and there is often a waiting list.   [facility] [address], [place]  [phone] [email or web address]   [facility] [address] [place], [place] [phone], Ext. [phone] Second and Fourth Thursday of each month, opens at 6:30 AM; Clinic ends at 9 AM.  Patients are seen on a first-come first-served basis, and a limited number are seen during each clinic.   [facility]  [address] Hillard Danker [place], [place] [phone]   Eligibility Requirements You must have lived in [place], [place], or [place] counties for at least the last three months.   You cannot be eligible for state or federal sponsored [employer], including [employer], [place], or [employer].   You generally cannot be eligible for healthcare insurance through your employer.    How to apply: Eligibility screenings are held every Tuesday and Wednesday afternoon from 1:00 pm until 4:00 pm. You do not need an appointment for the interview!  [facility] [address] [place], [place]   [place]  [place] Department  [place]  [phone]    Behavioral Health Resources in the Community: Intensive Outpatient Programs Organization         Address  Phone  Notes  [place]. [address] [place], [place] [phone]   [facility] Outpatient [address] [place], [place]   [facility] [address] [place], [place]   [place] [address]  [place], [place] or [phone]   Substance Abuse Resources Organization         Address  Phone  Notes  [facility] Drug Services  [phone]   [place]  [phone]   [facility] [place]   Chinita Pester  [phone]   Residential & Outpatient Substance Abuse Program  [phone]    Psychological Services Organization         Address  Phone  Notes  [facility] [place]  [place]  [phone]   [place] [address] [place] or [phone]    Mobile Crisis Teams Organization         Address  Phone  Notes  [facility], Mobile Crisis Care Unit  [phone]   [facility]  [address] [place], [place]   [place] [phone]  [address] °[place] [phone]   ° °Self-Help/Support Groups °Organization         Address  Phone             Notes  °[facility] [place] - variety of support groups  336- [phone] Call for more information  °Narcotics Anonymous (NA), [facility] [address], °[place]  2 meetings at this location  ° °Residential Treatment Programs °Organization         Address  Phone  Notes  °[facility] [address],    °[place]  [phone]   °[facility] ° [address], [place], [place] [phone]   °[facility] [address], [place] [phone] Admissions: 8am-3pm M-F  °[facility] [address]    °[place], [place] [phone]   °[facility] [address], [place], [place] [phone]   °[facility] [address]  °[place], [place] [phone]   °[facility] - Intensive Outpatient [address], [place], [place] [phone]   °[facility] [address]  °[place], [place] 1-877-615-2722 or [phone]   °[facility] ([facility]) [address] [place], [place] [phone] Accepts Medicaid  °[facility] [address]  °[place] 1-800-659-3381 Substance Abuse/Addiction Treatment  ° °[place] Behavioral Health Resources °Organization         Address  Phone  Notes  °[facility]  ([phone]) [phone]   °Julie Brannon, PhD [address] [place], [place]   ([phone]) [phone] or (336) [phone]   °[facility]   [address] °[place], [place] ([phone]) [phone]   °[facility] [address], [place], [place] ([phone]) [phone] Insurance/Medicaid/sponsorship through [facility]  °[facility] [address]                                    [place], [place] ([phone]) [phone] Therapy/tele-psych/case  °[facility] [address]  ° [place], [place] ([phone]) [phone]    °Dr. Arfeen  ([phone]) [phone]   °[facility] [place]  [employer] [place] [facility] 1) [address],  °2) [address], [place] °3)  [address], [place] ([phone]) [phone] °(336) [phone] ° °(336) [phone]   °[place] Child Abuse Hotline (336) 342-1394 or (336) 342-3537 (After Hours)    ° ° ° °

## 2015-05-16 NOTE — Telephone Encounter (Signed)
Patient will come see Alonza Bogus, PA

## 2015-05-17 ENCOUNTER — Encounter: Payer: Self-pay | Admitting: Gastroenterology

## 2015-05-17 ENCOUNTER — Ambulatory Visit (INDEPENDENT_AMBULATORY_CARE_PROVIDER_SITE_OTHER): Payer: PRIVATE HEALTH INSURANCE | Admitting: Gastroenterology

## 2015-05-17 VITALS — BP 104/68 | HR 80 | Ht 71.5 in | Wt 180.4 lb

## 2015-05-17 DIAGNOSIS — R9389 Abnormal findings on diagnostic imaging of other specified body structures: Secondary | ICD-10-CM | POA: Insufficient documentation

## 2015-05-17 DIAGNOSIS — K6389 Other specified diseases of intestine: Secondary | ICD-10-CM | POA: Diagnosis not present

## 2015-05-17 DIAGNOSIS — R938 Abnormal findings on diagnostic imaging of other specified body structures: Secondary | ICD-10-CM

## 2015-05-17 NOTE — Progress Notes (Signed)
Reviewed and agree with management plan.  Kelsie Kramp T. Crisanto Nied, MD FACG 

## 2015-05-17 NOTE — Patient Instructions (Signed)

## 2015-05-17 NOTE — Progress Notes (Signed)
     05/17/2015 Hunter Walker [id number] 1950/08/29   History of Present Illness:  This is a 64 year old male who is new to our practice. He was actually scheduled for a screening colonoscopy with Dr. Havery Moros for February. He has never undergone colonoscopy in the past. He recently went to the ER for GI symptoms consistent with gastroenteritis; says that his wife had norovirus recently. While he was in the ER he had a CT scan of the abdomen and pelvis with contrast that showed concern for an infiltrating mass involving the rectosigmoid colon more worrisome for tumor than Focal Colitis. Also Had a Single Adjacent Prominent Left Perirectal Lymph Node. He Says That All of His Acute Symptoms for Which he Went to the ER Have Resolved. He Does Have Rectal Bleeding with Bowel Movements that Has Been Going on for Quite Some Time but he Contributed that to Hemorrhoids.   Current Medications, Allergies, Past Medical History, Past Surgical History, Family History and Social History were reviewed in [employer] record.   Physical Exam: BP 104/68 mmHg  Pulse 80  Ht 5' 11.5" (1.816 m)  Wt 180 lb 6 oz (81.818 kg)  BMI 24.81 kg/m2 General: Well developed white male in no acute distress Head: Normocephalic and atraumatic Eyes:  Sclerae anicteric, conjunctiva pink  Ears: Normal auditory acuity Lungs: Clear throughout to auscultation Heart: Regular rate and rhythm Abdomen: Soft, non-distended.  Normal bowel sounds.  Non-tender. Rectal:  Will be done at the time of colonoscopy. Musculoskeletal: Symmetrical with no gross deformities  Extremities: No edema  Neurological: Alert oriented x 4, grossly non-focal Psychological:  Alert and cooperative. Normal mood and affect  Assessment and Recommendations: -Abnormal CT scan with colon mass:  Recent CT scan showing concern for infiltrating rectosigmoid mass.  Will schedule colonoscopy next week with Dr. Fuller Plan.  The risks,  benefits, and alternatives to colonoscopy were discussed with the patient and he consents to proceed.

## 2015-05-22 ENCOUNTER — Telehealth: Payer: Self-pay

## 2015-05-22 ENCOUNTER — Encounter: Payer: Self-pay | Admitting: Gastroenterology

## 2015-05-22 ENCOUNTER — Ambulatory Visit (AMBULATORY_SURGERY_CENTER): Payer: PRIVATE HEALTH INSURANCE | Admitting: Gastroenterology

## 2015-05-22 VITALS — BP 113/73 | HR 64 | Temp 97.2°F | Resp 14 | Ht 71.5 in | Wt 180.0 lb

## 2015-05-22 DIAGNOSIS — K6289 Other specified diseases of anus and rectum: Secondary | ICD-10-CM

## 2015-05-22 DIAGNOSIS — D124 Benign neoplasm of descending colon: Secondary | ICD-10-CM

## 2015-05-22 DIAGNOSIS — R933 Abnormal findings on diagnostic imaging of other parts of digestive tract: Secondary | ICD-10-CM | POA: Diagnosis present

## 2015-05-22 DIAGNOSIS — K6389 Other specified diseases of intestine: Secondary | ICD-10-CM | POA: Diagnosis not present

## 2015-05-22 DIAGNOSIS — D120 Benign neoplasm of cecum: Secondary | ICD-10-CM | POA: Diagnosis not present

## 2015-05-22 MED ORDER — SODIUM CHLORIDE 0.9 % IV SOLN: 500.0000 mL | INTRAVENOUS | Status: DC

## 2015-05-22 NOTE — Op Note (Signed)
[facility]  [address] [place], [address]   COLONOSCOPY PROCEDURE REPORT  PATIENT: Hunter Walker  MR#: [id number] BIRTHDATE: 12-16-1950 , 64  yrs. old GENDER: male ENDOSCOPIST: Ladene Artist, MD, [facility] REFERRED BY: Leonard Schwartz, MD PROCEDURE DATE:  05/22/2015 PROCEDURE:   Colonoscopy, diagnostic, Colonoscopy with biopsy, and Colonoscopy with snare polypectomy First Screening Colonoscopy - Avg.  risk and is 50 yrs.  old or older - No.  Prior Negative Screening - Now for repeat screening. N/A  History of Adenoma - Now for follow-up colonoscopy & has been > or = to 3 yrs.  N/A  Polyps removed today? Yes ASA CLASS:   Class II INDICATIONS:Evaluation of unexplained GI bleeding, hematochezia, and Evaluation of barium enema or other imaging study of an abnormality that is likely to be clinically significant, such as filling defect or stricture.  CT scan showing colon mass. MEDICATIONS: Monitored anesthesia care and Propofol 300 mg IV DESCRIPTION OF PROCEDURE:   After the risks benefits and alternatives of the procedure were thoroughly explained, informed consent was obtained.  The digital rectal exam revealed no abnormalities of the rectum.   The LB PCF Q180 [id number] and LB PFC-H190 [id number]  endoscope was introduced through the anus and advanced to the cecum, which was identified by both the appendix and ileocecal valve. No adverse events experienced.   The quality of the prep was fair, suboptimal (Suprep was used) with extensive rinsing and suctioning needed to achieve the fair prep.   The instrument was then slowly withdrawn as the colon was fully examined. Estimated blood loss is zero unless otherwise noted in this procedure report.    COLON FINDINGS: A near circumferential fungating and firm mass, measuring 3 X 5cm in size, with friable surfaces was found in the rectum from 10-15 cm from the anal verge.  Multiple biopsies of the lesion were performed.    A sessile polyp measuring 20 mm in size was found at the cecum.  A polypectomy was performed in a piecemeal fashion using snare cautery.  The resection was complete, the polyp tissue was completely retrieved and sent to histology.   A sessile polyp measuring 6 mm in size was found in the descending colon.  A polypectomy was performed with a cold snare.  The resection was complete, the polyp tissue was completely retrieved and sent to histology.   The examination was otherwise normal.  Retroflexed views revealed internal Grade I hemorrhoids. The time to cecum = 6.2 Withdrawal time = 26.7   The scope was withdrawn and the procedure completed.  COMPLICATIONS: There were no immediate complications.  ENDOSCOPIC IMPRESSION: 1.   Near circumferential mass from 10-15 cm in the rectum; multiple biopsies of the lesion performed 2.   Sessile polyp at the cecum; polypectomy performed in a piecemeal fashion using snare cautery 3.   Sessile polyp in the descending colon; polypectomy performed with a cold snare 4.   Grade l internal hemorrhods  RECOMMENDATIONS: 1.  Await pathology results 2.  Avoid ASA/NSAIDs for 3 weeks 3.  Repeat colonoscopy in 6 months with a more thorough/extensive bowel prep 4.  Surgical referral  eSigned:  Ladene Artist, MD, [facility] 05/22/2015 10:44 AM     PATIENT NAME:  Hunter Walker, Hunter Walker MR#: [id number]

## 2015-05-22 NOTE — Telephone Encounter (Signed)
Scheduled patient appt at [facility] with Dr. Marcello Moores on 05/26/15 at 9:40am and to arrive at 9:10am. Gave information to [facility] on 4th floor to relay to patient since patient is still in recovery.

## 2015-05-22 NOTE — Progress Notes (Signed)
[facility] APPT ON 05/26/2015 AT 9:10AM.  [id number] [address]; pt is aware.

## 2015-05-22 NOTE — Progress Notes (Signed)
Called to room to assist during endoscopic procedure.  Patient ID and intended procedure confirmed with present staff. Received instructions for my participation in the procedure from the performing physician.  

## 2015-05-22 NOTE — Telephone Encounter (Signed)
-----   Message from Ladene Artist, MD sent at 05/22/2015 10:52 AM EST ----- See colonoscopy report. Need [facility] appt ASAP for partially obstructing and bleeding proximal rectal cancer.

## 2015-05-22 NOTE — Progress Notes (Signed)
Report to PACU, RN, vss, BBS= Clear.  

## 2015-05-22 NOTE — Patient Instructions (Signed)
YOU HAD AN ENDOSCOPIC PROCEDURE TODAY AT [facility]:   Refer to the procedure report that was given to you for any specific questions about what was found during the examination.  If the procedure report does not answer your questions, please call your gastroenterologist to clarify.  If you requested that your care partner not be given the details of your procedure findings, then the procedure report has been included in a sealed envelope for you to review at your convenience later.  YOU SHOULD EXPECT: Some feelings of bloating in the abdomen. Passage of more gas than usual.  Walking can help get rid of the air that was put into your GI tract during the procedure and reduce the bloating. If you had a lower endoscopy (such as a colonoscopy or flexible sigmoidoscopy) you may notice spotting of blood in your stool or on the toilet paper. If you underwent a bowel prep for your procedure, you may not have a normal bowel movement for a few days.  Please Note:  You might notice some irritation and congestion in your nose or some drainage.  This is from the oxygen used during your procedure.  There is no need for concern and it should clear up in a day or so.  SYMPTOMS TO REPORT IMMEDIATELY:   Following lower endoscopy (colonoscopy or flexible sigmoidoscopy):  Excessive amounts of blood in the stool  Significant tenderness or worsening of abdominal pains  Swelling of the abdomen that is new, acute  Fever of 100F or higher   For urgent or emergent issues, a gastroenterologist can be reached at any hour by calling [phone].   DIET: Your first meal following the procedure should be a small meal and then it is ok to progress to your normal diet. Heavy or fried foods are harder to digest and may make you feel nauseous or bloated.  Likewise, meals heavy in dairy and vegetables can increase bloating.  Drink plenty of fluids but you should avoid alcoholic beverages for 24  hours.  ACTIVITY:  You should plan to take it easy for the rest of today and you should NOT DRIVE or use heavy machinery until tomorrow (because of the sedation medicines used during the test).    FOLLOW UP: Our staff will call the number listed on your records the next business day following your procedure to check on you and address any questions or concerns that you may have regarding the information given to you following your procedure. If we do not reach you, we will leave a message.  However, if you are feeling well and you are not experiencing any problems, there is no need to return our call.  We will assume that you have returned to your regular daily activities without incident.  If any biopsies were taken you will be contacted by phone or by letter within the next 1-3 weeks.  Please call us at [phone] if you have not heard about the biopsies in 3 weeks.    SIGNATURES/CONFIDENTIALITY: You and/or your care partner have signed paperwork which will be entered into your electronic medical record.  These signatures attest to the fact that that the information above on your After Visit Summary has been reviewed and is understood.  Full responsibility of the confidentiality of this discharge information lies with you and/or your care-partner.  TRY TO AVOID ASPIRIN, ALEVE, AND IBUPROFEN FOR 3 WEEKS PER DR STARK.  DR Silvio Pate CMA FROM THE 3RD FLOOR WILL HELP YOU  TO ARRANGE A SURGICAL CONSULT.  THEY WILL CALL YOU.  PLEASE RETURN FOR ANOTHER COLONOSCOPY IN 6 MONTHS. WE WILL NOTIFY YOU BY MAIL.

## 2015-05-23 ENCOUNTER — Telehealth: Payer: Self-pay | Admitting: *Deleted

## 2015-05-23 NOTE — Telephone Encounter (Signed)
  Follow up Call-  Call back number 05/22/2015  Post procedure Call Back phone  # [phone]  Permission to leave phone message Yes     Patient questions:  Do you have a fever, pain , or abdominal swelling? No. Pain Score  0 *  Have you tolerated food without any problems? Yes.    Have you been able to return to your normal activities? Yes.    Do you have any questions about your discharge instructions: Diet   No. Medications  No. Follow up visit  No.  Do you have questions or concerns about your Care? No.  Actions: * If pain score is 4 or above: No action needed, pain <4.

## 2015-05-26 ENCOUNTER — Telehealth: Payer: Self-pay

## 2015-05-26 ENCOUNTER — Other Ambulatory Visit: Payer: Self-pay | Admitting: General Surgery

## 2015-05-26 ENCOUNTER — Telehealth: Payer: Self-pay | Admitting: General Surgery

## 2015-05-26 ENCOUNTER — Other Ambulatory Visit: Payer: Self-pay

## 2015-05-26 DIAGNOSIS — C20 Malignant neoplasm of rectum: Secondary | ICD-10-CM

## 2015-05-26 NOTE — Telephone Encounter (Signed)
Dr Ardis Hughs is arranging this for next week. Check with Patti.  I left message for patient on his home phone to call back to review pathology report.

## 2015-05-26 NOTE — Telephone Encounter (Signed)
-----   Message from Milus Banister, MD sent at 05/26/2015 [id number] AM EST ----- Elmo Putt, I'm sure I can get it done next week. [employer], He needs lower EUS, radial +/- linear, moderate sedation, book for 3pm to keep MAC spots open earlier.  Next Thursday (29th), for newly diagnosed rectal cancer.  thanks     ----- Message -----    From: Leighton Ruff, MD    Sent: 05/26/2015  10:03 AM      To: Milus Banister, MD, Ladene Artist, MD  This guy needs a rectal [place] and tattoo for his newly diagnosed rectal cancer.  Would it be possible to get that done next week?  Thanks  Elmo Putt

## 2015-05-26 NOTE — Telephone Encounter (Signed)
Dr Marcello Moores is seeing this patient today for rectal cancer found at colonoscopy 05/22/15.  She is asking that you get a rectal u/s on him. Okay to order?

## 2015-05-26 NOTE — Telephone Encounter (Signed)
See below

## 2015-05-26 NOTE — Telephone Encounter (Signed)
Pt has been scheduled for EUS instructions have been mailed.  Message left for pt to return call to go over instructions.

## 2015-05-26 NOTE — Telephone Encounter (Signed)
Opened in error

## 2015-05-30 ENCOUNTER — Ambulatory Visit
Admission: RE | Admit: 2015-05-30 | Discharge: 2015-05-30 | Disposition: A | Discharge status: Home / Self Care | Payer: PRIVATE HEALTH INSURANCE | Source: Ambulatory Visit | Attending: General Surgery | Admitting: General Surgery

## 2015-05-30 DIAGNOSIS — C20 Malignant neoplasm of rectum: Secondary | ICD-10-CM

## 2015-05-30 MED ORDER — IOPAMIDOL (ISOVUE-300) INJECTION 61%
75.0000 mL | Freq: Once | INTRAVENOUS | Status: AC | PRN
  Administered 2015-05-30: 75 mL via INTRAVENOUS

## 2015-05-30 NOTE — Telephone Encounter (Signed)
EUS scheduled, pt instructed and medications reviewed.  Patient instructions mailed to home.  Patient to call with any questions or concerns.  

## 2015-05-30 NOTE — Telephone Encounter (Signed)
Hunter Walker has been notified about EUS and will call back a with any further questions. Left message on home and machine to call back regarding EUS

## 2015-06-01 ENCOUNTER — Encounter (HOSPITAL_COMMUNITY)
Admission: RE | Disposition: A | Discharge status: Home / Self Care | Payer: Self-pay | Source: Ambulatory Visit | Attending: Gastroenterology

## 2015-06-01 ENCOUNTER — Telehealth: Payer: Self-pay

## 2015-06-01 ENCOUNTER — Ambulatory Visit (HOSPITAL_COMMUNITY)
Admission: RE | Admit: 2015-06-01 | Discharge: 2015-06-01 | Disposition: A | Discharge status: Home / Self Care | Payer: PRIVATE HEALTH INSURANCE | Source: Ambulatory Visit | Attending: Gastroenterology | Admitting: Gastroenterology

## 2015-06-01 ENCOUNTER — Encounter (HOSPITAL_COMMUNITY): Payer: Self-pay

## 2015-06-01 DIAGNOSIS — C20 Malignant neoplasm of rectum: Secondary | ICD-10-CM | POA: Diagnosis not present

## 2015-06-01 DIAGNOSIS — K639 Disease of intestine, unspecified: Secondary | ICD-10-CM | POA: Diagnosis present

## 2015-06-01 HISTORY — PX: EUS: SHX5427

## 2015-06-01 SURGERY — ULTRASOUND, LOWER GI TRACT, ENDOSCOPIC
Anesthesia: Moderate Sedation

## 2015-06-01 MED ORDER — SODIUM CHLORIDE 0.9 % IV SOLN: INTRAVENOUS | Status: DC

## 2015-06-01 MED ORDER — MIDAZOLAM HCL 5 MG/ML IJ SOLN
INTRAMUSCULAR | Status: AC
  Filled 2015-06-01: qty 2

## 2015-06-01 MED ORDER — MIDAZOLAM HCL 10 MG/2ML IJ SOLN
INTRAMUSCULAR | Status: DC | PRN
  Administered 2015-06-01 (×2): 2 mg via INTRAVENOUS

## 2015-06-01 MED ORDER — SPOT INK MARKER SYRINGE KIT
PACK | SUBMUCOSAL | Status: DC | PRN
  Administered 2015-06-01: 3.5 mL via SUBMUCOSAL

## 2015-06-01 MED ORDER — FENTANYL CITRATE (PF) 100 MCG/2ML IJ SOLN
INTRAMUSCULAR | Status: DC | PRN
  Administered 2015-06-01 (×2): 25 ug via INTRAVENOUS

## 2015-06-01 MED ORDER — SPOT INK MARKER SYRINGE KIT
PACK | SUBMUCOSAL | Status: AC
  Filled 2015-06-01: qty 5

## 2015-06-01 MED ORDER — FENTANYL CITRATE (PF) 100 MCG/2ML IJ SOLN
INTRAMUSCULAR | Status: AC
  Filled 2015-06-01: qty 2

## 2015-06-01 MED ORDER — DIPHENHYDRAMINE HCL 50 MG/ML IJ SOLN
INTRAMUSCULAR | Status: AC
  Filled 2015-06-01: qty 1

## 2015-06-01 MED ORDER — LACTATED RINGERS IV SOLN
INTRAVENOUS | Status: DC
  Administered 2015-06-01: 1000 mL via INTRAVENOUS

## 2015-06-01 NOTE — H&P (View-Only) (Signed)
     05/17/2015 Hunter Walker [id number] [birth year]   History of Present Illness:  This is a 64 year old male who is new to our practice. He was actually scheduled for a screening colonoscopy with Dr. Havery Moros for February. He has never undergone colonoscopy in the past. He recently went to the ER for GI symptoms consistent with gastroenteritis; says that his wife had norovirus recently. While he was in the ER he had a CT scan of the abdomen and pelvis with contrast that showed concern for an infiltrating mass involving the rectosigmoid colon more worrisome for tumor than Focal Colitis. Also Had a Single Adjacent Prominent Left Perirectal Lymph Node. He Says That All of His Acute Symptoms for Which he Went to the ER Have Resolved. He Does Have Rectal Bleeding with Bowel Movements that Has Been Going on for Quite Some Time but he Contributed that to Hemorrhoids.   Current Medications, Allergies, Past Medical History, Past Surgical History, Family History and Social History were reviewed in [employer] record.   Physical Exam: BP 104/68 mmHg  Pulse 80  Ht 5' 11.5" (1.816 m)  Wt 180 lb 6 oz (81.818 kg)  BMI 24.81 kg/m2 General: Well developed white male in no acute distress Head: Normocephalic and atraumatic Eyes:  Sclerae anicteric, conjunctiva pink  Ears: Normal auditory acuity Lungs: Clear throughout to auscultation Heart: Regular rate and rhythm Abdomen: Soft, non-distended.  Normal bowel sounds.  Non-tender. Rectal:  Will be done at the time of colonoscopy. Musculoskeletal: Symmetrical with no gross deformities  Extremities: No edema  Neurological: Alert oriented x 4, grossly non-focal Psychological:  Alert and cooperative. Normal mood and affect  Assessment and Recommendations: -Abnormal CT scan with colon mass:  Recent CT scan showing concern for infiltrating rectosigmoid mass.  Will schedule colonoscopy next week with Dr. Fuller Plan.  The risks,  benefits, and alternatives to colonoscopy were discussed with the patient and he consents to proceed.

## 2015-06-01 NOTE — Discharge Instructions (Signed)

## 2015-06-01 NOTE — Interval H&P Note (Signed)
History and Physical Interval Note:  06/01/2015 1:09 PM  Hunter Walker  has presented today for surgery, with the diagnosis of rectal cancer  The various methods of treatment have been discussed with the patient and family. After consideration of risks, benefits and other options for treatment, the patient has consented to  Procedure(s): LOWER ENDOSCOPIC ULTRASOUND (EUS) (N/A) as a surgical intervention .  The patient's history has been reviewed, patient examined, no change in status, stable for surgery.  I have reviewed the patient's chart and labs.  Questions were answered to the patient's satisfaction.     Milus Banister

## 2015-06-01 NOTE — Telephone Encounter (Signed)
-----   Message from Milus Banister, MD sent at 06/01/2015  2:05 PM EST ----- Hunter Walker, Just completed EUS. See below. He will need referral to medical and radiation oncology.  I'll have Hunter Walker work on that.  Thanks  [name], He needs referral to medical and radiation oncology for newly diagnosed Stage IIIb rectal adenocarcinoma.  tahnks      IMPRESSIONS: 5cm long, 1.4cm deep nearly circumferential uT3N1(Stage IIIb) rectal adenocarcinoma with distal edge located 7cm from the anal verge. The lateral edges of the lesion were labeled with two submucosal SPOT injections.         RECOMMENDATIONS: He will likely benefit from neoajuvant chemo/XRT

## 2015-06-01 NOTE — Telephone Encounter (Signed)
Referral to Med and Rad onc has been made, I will confirm appt in a few days.

## 2015-06-01 NOTE — Op Note (Signed)
[facility] [place], [address]   LOWER ENDOSCOPIC ULTRASOUND PROCEDURE REPORT     EXAM DATE: 06/01/2015 PATIENT NAME:          Hunter Walker, Hunter Walker          MR#: [id number] BIRTHDATE:       September 20, 1950     VISIT #:     [phone] ATTENDING:     Milus Banister, MD     STATUS:     outpatient ASSISTANT:      Elspeth Cho and Alinda Sierras MD:  Leighton Ruff, M.D. ASA CLASS:        Class II INDICATIONS:  The patient is a 64 yr old male here for a lower endoscopic ultrasound due to recently diagnosed rectal adenocarcinoma (colonoscopy Dr.  Fuller Plan) without obvious metastatic disease. PROCEDURE PERFORMED:     Lower EUS MEDICATIONS:     Fentanyl 50 mcg IV and Versed 4 mg IV CONSENT: The patient understands the risks and benefits of the procedure and understands that these risks include, but are not limited to: sedation, allergic reaction, infection, perforation and/or bleeding. Alternative means of evaluation and treatment include, among others: physical exam, x-rays, and/or surgical intervention. The patient elects to proceed with this endoscopic procedure. DESCRIPTION OF PROCEDURE: During intra-op preparation period all mechanical & medical equipment was checked for proper function. Hand hygiene and appropriate measures for infection prevention was taken. After the risks, benefits and alternatives of the procedure were thoroughly explained, Informed was verified, confirmed and timeout was successfully executed by the treatment team. Under direct visualization, the PENTAX EUS SCOPE was introduced through the anus and advanced to the sigmoid colon.  Water was used as necessary to provide an acoustic interface.  The scope was then completely withdrawn from the patient and the procedure terminated. The patient's toleration of the procedure was excellent.  The pulse, BP, and O2 saturation were monitored and documented by the physician and  the nursing staff throughout the entire procedure. Upon completion of the imaging, water was removed and the patient was discharged to recovery in stable condition with the appropriate post procedure care. Estimated blood loss is zero unless otherwise noted in this procedure report.   Sigmoidoscopic findings: 1. Nearly circumferential mass located in proximal to mid rectum, distal edge lays 7cm from the anal verge. The mass is 5cm in length, clearly malignant.  Following EUS evaluation the lateral edges of the lesion were labeled with SPOT EUS findings: 1. The mass above correlates with a hypoechoic lesion that is 1.4cm deep, clearly passes into and through the muscularis propria (uT3) layer of the rectal wall. 2. There was one round, suspicious perirectal lymphnode that measured 1.1cm maximally (uN1)  ADVERSE EVENTS:     There were no immediate complications. IMPRESSIONS:     5cm long, 1.4cm deep nearly circumferential uT3N1 (Stage IIIb) rectal adenocarcinoma with distal edge located 7cm from the anal verge. The lateral edges of the lesion were labeled with two submucosal SPOT injections.  RECOMMENDATIONS:     He will likely benefit from neoajuvant chemo/XRT  ___________________________________ Milus Banister, MD eSigned:  Milus Banister, MD 06/01/2015 2:04 PM    PATIENT NAME:  Hunter, Walker MR#: [id number]

## 2015-06-02 ENCOUNTER — Encounter (HOSPITAL_COMMUNITY): Payer: Self-pay | Admitting: Gastroenterology

## 2015-06-07 ENCOUNTER — Telehealth: Payer: Self-pay | Admitting: General Surgery

## 2015-06-07 ENCOUNTER — Telehealth: Payer: Self-pay | Admitting: Gastroenterology

## 2015-06-07 NOTE — Telephone Encounter (Signed)
Discussed with CT results with patient.  CT chest shows more concerning liver lesions.  We discussed this in tumor conference this am and liver biopsy was recommended.  We will get this set up and get pt apts to see oncologist and radiation oncologist.  Pt having 6-8 BM's a day with stool softeners.  Having rectal pain, which sounds like hemorrhoid irritation.  Recommended that he use the anusol that was prescribed for this.

## 2015-06-07 NOTE — Telephone Encounter (Signed)
Bleeding and discomfort could be from hemorrhoids or the tumor. Can try prep H supp OTC 1-2 daily as needed which will help hemorrhoid symptoms. Of course proceeding with treatment of the tumor as per oncology and surgery.

## 2015-06-07 NOTE — Telephone Encounter (Signed)
Pt states he had an EUS last Thursday. States that yesterday he noticed a dark purple color in the toilet water when he had a BM. States he has not seen anymore of the color since yesterday. Pt states he has had some abdominal discomfort and rectal discomfort also. Surgeon told pt the rectal discomfort was probably related to the hemorrhoids but he wanted to let his GI doctor know and see if there were any other recommendations.

## 2015-06-08 ENCOUNTER — Other Ambulatory Visit (HOSPITAL_COMMUNITY): Payer: Self-pay | Admitting: General Surgery

## 2015-06-08 ENCOUNTER — Encounter: Payer: Self-pay | Admitting: *Deleted

## 2015-06-08 DIAGNOSIS — C20 Malignant neoplasm of rectum: Secondary | ICD-10-CM

## 2015-06-08 NOTE — Telephone Encounter (Signed)
Patient notified

## 2015-06-09 ENCOUNTER — Telehealth: Payer: Self-pay | Admitting: *Deleted

## 2015-06-09 NOTE — Telephone Encounter (Signed)
Oncology Nurse Navigator Documentation  Oncology Nurse Navigator Flowsheets 06/09/2015  Navigator Location [facility]  Navigator Encounter Type Introductory phone call  Abnormal Finding Date 05/15/2015  Confirmed Diagnosis Date 05/22/2015  Time Spent with Patient 35  Spoke with patient and provided new patient appointment for 06/16/15 at 12:15/12:30 with Dr. Benay Spice. Informed of location of [place], valet service, and registration process. Reminded to bring insurance cards and a current medication list, including supplements. Patient verbalizes understanding. He inquired about an appointment on 19/17 with a nurse and if he would be able to make that. Informed him it most likely needs to be moved to 1/12 when he sees Dr. Lisbeth Renshaw.  Called Enid Derry in radiation oncology. She have this corrected and have him called.

## 2015-06-12 ENCOUNTER — Other Ambulatory Visit: Payer: Self-pay | Admitting: Radiology

## 2015-06-13 ENCOUNTER — Other Ambulatory Visit: Payer: Self-pay | Admitting: Physician Assistant

## 2015-06-14 ENCOUNTER — Ambulatory Visit (HOSPITAL_COMMUNITY)
Admission: RE | Admit: 2015-06-14 | Discharge: 2015-06-14 | Disposition: A | Discharge status: Home / Self Care | Payer: PRIVATE HEALTH INSURANCE | Source: Ambulatory Visit | Attending: General Surgery | Admitting: General Surgery

## 2015-06-14 ENCOUNTER — Encounter: Payer: Self-pay | Admitting: Radiation Oncology

## 2015-06-14 ENCOUNTER — Other Ambulatory Visit (HOSPITAL_COMMUNITY): Payer: Self-pay | Admitting: General Surgery

## 2015-06-14 ENCOUNTER — Encounter (HOSPITAL_COMMUNITY): Payer: Self-pay

## 2015-06-14 DIAGNOSIS — C20 Malignant neoplasm of rectum: Secondary | ICD-10-CM

## 2015-06-14 DIAGNOSIS — K219 Gastro-esophageal reflux disease without esophagitis: Secondary | ICD-10-CM | POA: Diagnosis not present

## 2015-06-14 DIAGNOSIS — Z8042 Family history of malignant neoplasm of prostate: Secondary | ICD-10-CM | POA: Insufficient documentation

## 2015-06-14 DIAGNOSIS — K769 Liver disease, unspecified: Secondary | ICD-10-CM | POA: Diagnosis not present

## 2015-06-14 DIAGNOSIS — Z803 Family history of malignant neoplasm of breast: Secondary | ICD-10-CM | POA: Insufficient documentation

## 2015-06-14 DIAGNOSIS — Z8249 Family history of ischemic heart disease and other diseases of the circulatory system: Secondary | ICD-10-CM | POA: Insufficient documentation

## 2015-06-14 DIAGNOSIS — Z538 Procedure and treatment not carried out for other reasons: Secondary | ICD-10-CM | POA: Insufficient documentation

## 2015-06-14 DIAGNOSIS — Z833 Family history of diabetes mellitus: Secondary | ICD-10-CM | POA: Insufficient documentation

## 2015-06-14 DIAGNOSIS — Z87891 Personal history of nicotine dependence: Secondary | ICD-10-CM | POA: Diagnosis not present

## 2015-06-14 LAB — CBC
HCT: 31.3 % — ABNORMAL LOW (ref 39.0–52.0)
Hemoglobin: 9.6 g/dL — ABNORMAL LOW (ref 13.0–17.0)
MCH: 26.2 pg (ref 26.0–34.0)
MCHC: 30.7 g/dL (ref 30.0–36.0)
MCV: 85.3 fL (ref 78.0–100.0)
PLATELETS: 319 10*3/uL (ref 150–400)
RBC: 3.67 MIL/uL — ABNORMAL LOW (ref 4.22–5.81)
RDW: 15.3 % (ref 11.5–15.5)
WBC: 5.7 10*3/uL (ref 4.0–10.5)

## 2015-06-14 LAB — APTT: APTT: 22 s — AB (ref 24–37)

## 2015-06-14 LAB — PROTIME-INR
INR: 1.09 (ref 0.00–1.49)
PROTHROMBIN TIME: 14.3 s (ref 11.6–15.2)

## 2015-06-14 MED ORDER — SODIUM CHLORIDE 0.9 % IV SOLN
INTRAVENOUS | Status: AC | PRN
  Administered 2015-06-14: 10 mL/h via INTRAVENOUS

## 2015-06-14 MED ORDER — SODIUM CHLORIDE 0.9 % IV SOLN: Freq: Once | INTRAVENOUS | Status: DC

## 2015-06-14 MED ORDER — MIDAZOLAM HCL 2 MG/2ML IJ SOLN
INTRAMUSCULAR | Status: AC
  Filled 2015-06-14: qty 2

## 2015-06-14 MED ORDER — FENTANYL CITRATE (PF) 100 MCG/2ML IJ SOLN
INTRAMUSCULAR | Status: AC
  Filled 2015-06-14: qty 2

## 2015-06-14 MED ORDER — GELATIN ABSORBABLE 12-7 MM EX MISC
CUTANEOUS | Status: AC
  Filled 2015-06-14: qty 1

## 2015-06-14 NOTE — Sedation Documentation (Signed)
Procedure cancelled per dr Kathlene Cote

## 2015-06-14 NOTE — H&P (Signed)
Chief Complaint: Patient was seen in consultation today for liver lesion biposy at the request of [place]  Referring Physician(s): Thomas,Alicia  History of Present Illness: Hunter Walker is a 65 y.o. male with newly diagnosed rectal cancer. He had a CT of the abdomen which found a few small liver lesions, possibly mets. He is referred for US guided biopsy. PMHx, meds, labs, imaging reviewed. Has been NPO all morning. Wife at bedside  Past Medical History  Diagnosis Date  . Allergic rhinitis   . GERD (gastroesophageal reflux disease)   . Hemorrhoids     Past Surgical History  Procedure Laterality Date  . No past surgeries    . Eus N/A 06/01/2015    Procedure: LOWER ENDOSCOPIC ULTRASOUND (EUS);  Surgeon: Milus Banister, MD;  Location: [facility];  Service: Endoscopy;  Laterality: N/A;    Allergies: Review of patient's allergies indicates no known allergies.  Medications: Prior to Admission medications   Medication Sig Start Date End Date Taking? Authorizing Provider  acetaminophen (TYLENOL) 325 MG tablet Take 650 mg by mouth every 6 (six) hours as needed for moderate pain.   Yes Historical Provider, MD  docusate sodium (COLACE) 100 MG capsule Take 1 capsule (100 mg total) by mouth every 12 (twelve) hours. 03/20/15  Yes Sharlett Iles, MD  famotidine (PEPCID) 20 MG tablet Take 1 tablet (20 mg total) by mouth 2 (two) times daily. 05/16/15  Yes Nicole Pisciotta, PA-C  hydrocortisone (ANUSOL-HC) 2.5 % rectal cream Apply small amount of medication on hemorrhoid 2-4 times daily as needed for comfort 03/20/15  Yes Sharlett Iles, MD  oxyCODONE-acetaminophen (PERCOCET/ROXICET) 5-325 MG tablet 1 to 2 tabs PO q6hrs  PRN for pain Patient taking differently: Take 1-2 tablets by mouth every 6 (six) hours as needed for moderate pain.  05/16/15  Yes Nicole Pisciotta, PA-C  sucralfate (CARAFATE) 1 GM/10ML suspension Take 10 mLs (1 g total) by mouth 4 (four)  times daily -  with meals and at bedtime. 05/16/15  Yes Nicole Pisciotta, PA-C     Family History  Problem Relation Age of Onset  . Breast cancer Sister     x 2  . Diabetes Mother   . Kidney disease Mother   . Prostate cancer Father   . Diabetes Father   . Prostate cancer Brother   . Diabetes Sister   . Heart disease Brother   . Colon cancer Neg Hx     Social History   Social History  . Marital Status: Married    Spouse Name: N/A  . Number of Children: 2  . Years of Education: N/A   Occupational History  . teacher    Social History Main Topics  . Smoking status: Former Smoker -- 1.00 packs/day for 10 years    Types: Cigarettes    Quit date: 06/03/1978  . Smokeless tobacco: Never Used  . Alcohol Use: 0.0 oz/week    0 Standard drinks or equivalent per week     Comment: occ  . Drug Use: No  . Sexual Activity: Not Asked   Other Topics Concern  . None   Social History Narrative     Review of Systems: A 12 point ROS discussed and pertinent positives are indicated in the HPI above.  All other systems are negative.  Review of Systems  Vital Signs: BP 130/77 mmHg  Pulse 65  Temp(Src) 97.9 F (36.6 C) (Oral)  Resp 16  Ht 6\' 1"  (1.854 m)  Wt 180  lb (81.647 kg)  BMI 23.75 kg/m2  SpO2 100%  Physical Exam  Constitutional: He appears well-developed and well-nourished. No distress.    Mallampati Score:  MD Evaluation Airway: WNL Heart: WNL Abdomen: WNL Chest/ Lungs: WNL ASA  Classification: 2 Mallampati/Airway Score: One  Imaging: Ct Chest W Contrast  05/30/2015  CLINICAL DATA:  Newly diagnosed invasive rectal adenocarcinoma, presenting for chest staging. EXAM: CT CHEST WITH CONTRAST TECHNIQUE: Multidetector CT imaging of the chest was performed during intravenous contrast administration. CONTRAST:  66mL ISOVUE-300 IOPAMIDOL (ISOVUE-300) INJECTION 61% COMPARISON:  05/15/2015 CT abdomen/pelvis.  No prior chest CT. FINDINGS: Mediastinum/Nodes: Normal heart  size. No pericardial fluid/thickening. Great vessels are normal in course and caliber. No central pulmonary emboli. Normal visualized thyroid. Normal esophagus. No pathologically enlarged axillary, mediastinal or hilar lymph nodes. Lungs/Pleura: No pneumothorax. No pleural effusion. There are three pulmonary nodules in the right lung measuring 5 mm in the anterior right upper lobe (series 5/image 17), 2 mm in the right upper lobe (5/16) and 2 mm in the right lower lobe (5/44). No acute consolidative airspace disease or lung masses. Upper abdomen: There are at least seven scattered subtle hypodense liver masses throughout the liver, largest 1.2 cm in the right liver lobe (series 2/ image 60) and 1.1 cm in the lateral segment left liver lobe (series 2/ image 55). Musculoskeletal:  No aggressive appearing focal osseous lesions. IMPRESSION: 1. At least seven scattered subtle hypodense liver masses throughout the liver, suspicious for liver metastases. Consider further characterization with MRI abdomen with and without intravenous contrast and/or PET-CT. 2. Three pulmonary nodules in the right lung, largest 5 mm in the right upper lobe, indeterminate, pulmonary metastases not excluded. Recommend attention on follow-up chest CT in 3 months. 3. No thoracic lymphadenopathy. Electronically Signed   By: Ilona Sorrel M.D.   On: 05/30/2015 13:52   Ct Abdomen Pelvis W Contrast  05/15/2015  CLINICAL DATA:  Mid abdominal pain with nausea, vomiting and diarrhea since last Saturday night. EXAM: CT ABDOMEN AND PELVIS WITH CONTRAST TECHNIQUE: Multidetector CT imaging of the abdomen and pelvis was performed using the standard protocol following bolus administration of intravenous contrast. CONTRAST:  119mL OMNIPAQUE IOHEXOL 300 MG/ML  SOLN COMPARISON:  None. FINDINGS: Lower chest: Mild dependent atelectasis at both lung bases. No confluent airspace opacity, pleural or pericardial effusion. Hepatobiliary: 6 mm low-density lesion  posteriorly in the right hepatic lobe on image number 18 is likely a cyst, but too small to optimally characterize. No suspicious hepatic findings. No evidence of gallstones, gallbladder wall thickening or biliary dilatation. Pancreas: The pancreatic duct appears mildly prominent, measuring up to 5 mm in diameter. No evidence of pancreatic mass, surrounding inflammation or pancreas divisum. Spleen: Normal in size without focal abnormality. Adrenals/Urinary Tract: Both adrenal glands appear normal. The kidneys appear normal without evidence of urinary tract calculus, suspicious lesion or hydronephrosis. No bladder abnormalities are seen. Stomach/Bowel: The stomach, small bowel and proximal colon demonstrate no abnormalities. The appendix appears normal. There is irregular wall thickening at the rectosigmoid junction extending over approximately 6 cm. There is no surrounding inflammatory change, and this finding is worrisome for infiltrating tumor. At least 1 prominent perirectal lymph node is noted, measuring 6 mm on image 76. Vascular/Lymphatic: As above, there is a prominent perirectal lymph node on the left. No other enlarged abdominal pelvic lymph nodes identified. There is mild atherosclerosis of the aorta, its branches and the iliac arteries. Reproductive: The prostate gland is moderately enlarged, measuring up to 6.2  x 4.7 cm transverse. Other: Small umbilical hernia containing only fat. Musculoskeletal: No acute or significant osseous findings. No evidence of metastatic disease. There are mild lumbar spine degenerative changes. IMPRESSION: 1. There is concern of an infiltrating mass involving the rectosigmoid colon. This has appearance more worrisome for tumor (colon cancer) than focal colitis. Sigmoidoscopy recommended. 2. Single adjacent prominent left perirectal lymph node. No distant metastases identified. No evidence of bowel obstruction. 3. Mild prominence of the pancreatic duct, possibly  postinflammatory. No other pancreatic abnormalities identified. 4. Mild atherosclerosis. Electronically Signed   By: Richardean Sale M.D.   On: 05/15/2015 12:58    Labs:  CBC:  Recent Labs  05/15/15 1020 05/16/15 1346  WBC 5.6 5.0  HGB 11.6* 11.1*  HCT 36.8* 35.2*  PLT 304 277    COAGS: No results for input(s): INR, APTT in the last 8760 hours.  BMP:  Recent Labs  05/15/15 1020 05/16/15 1346  NA 136 138  K 4.1 3.5  CL 101 103  CO2 29 26  GLUCOSE 119* 100*  BUN 16 13  CALCIUM 8.7* 8.8*  CREATININE 1.28* 1.19  GFRNONAA 58* >60  GFRAA >60 >60    LIVER FUNCTION TESTS:  Recent Labs  05/15/15 1020 05/16/15 1346  BILITOT 0.5 0.4  AST 22 24  ALT 15* 14*  ALKPHOS 62 58  PROT 7.7 7.5  ALBUMIN 3.8 3.7    Assessment and Plan: Rectal cancer Liver lesions For US guided liver lesion biopsy. Labs pending Risks and Benefits discussed with the patient including, but not limited to bleeding, infection, damage to adjacent structures or low yield requiring additional tests. All of the patient's questions were answered, patient is agreeable to proceed. Consent signed and in chart.    Thank you for this interesting consult.  A copy of this report was sent to the requesting provider on this date.  Electronically Signed: Ascencion Dike 06/14/2015, 12:36 PM   I spent a total of 18 minutes in face to face in clinical consultation, greater than 50% of which was counseling/coordinating care for liver lesion biopsy

## 2015-06-14 NOTE — Sedation Documentation (Signed)
MD at bedside.d/w pt and spouse poc

## 2015-06-14 NOTE — Progress Notes (Signed)
GI Location of Tumor / Histology: Rectal Cancer   Hunter Walker presented  months ago with symptoms of: epigastric pain with bloody diarrhea ,nausea ,vomiting   Biopsies of  (if applicable) revealed: [id number] : 1. Colon, polyp(s), cecum piece meal, polyp - MULTIPLE FRAGMENTS OF TUBULOVILLOUS ADENOMA.- NO HIGH GRADE DYSPLASIA OR MALIGNANCY. 1 of 3 FINAL for Hunter Walker [id number]) Diagnosis(continued) 2. Colon, polyp(s), descending, polyp- TUBULAR ADENOMA.- NO HIGH GRADE DYSPLASIA OR MALIGNANCY. 3. Rectum, biopsy, rectal mass- INVASIVE ADENOCARCINOMA.   Past/Anticipated interventions by surgeon, if any: EUS 06/01/15 Dr. Owens Loffler P,MD  Past/Anticipated interventions by medical oncology, if any:  Weight changes, if any:  no  Bowel/Bladder complaints, if any: rectal bleeding with bowel movements  Nausea / Vomiting, if any: this morning,  After waking up just nauseated,lasted 2 hours, nothing taken to relieve, has resolved   Pain issues, if [id number] bowel movements 4/5 on 10 scale   Any blood per rectum: bleeding yes, with bowel movements  SAFETY ISSUES: Fatigued no  Prior radiation? NO  Pacemaker/ICD? NO  Is the patient on methotrexate? NO  Current details/Co:Married, 2 children,  former smoker 1ppd x 10 years,quit 06/03/78, occasional  alcohol use,  no drug use Mother DM,Kidney disease,Father prostate Ca,DM, Sister breast cancer,  Allergies:NKA BP 120/77 mmHg  Pulse 68  Temp(Src) 98.3 F (36.8 C) (Oral)  Resp 16  Ht 6\' 1"  (1.854 m)  Wt 178 lb 1.6 oz (80.786 kg)  BMI 23.50 kg/m2  Wt Readings from Last 3 Encounters:  06/15/15 178 lb 1.6 oz (80.786 kg)  06/14/15 180 lb (81.647 kg)  06/01/15 180 lb (81.647 kg)    Patient is out of all  his medications percocet and carafate  rxs given in hospital, Pwrcocet makes him go to sleep, asking about a different pain medication

## 2015-06-15 ENCOUNTER — Ambulatory Visit
Admission: RE | Admit: 2015-06-15 | Discharge: 2015-06-15 | Disposition: A | Discharge status: Home / Self Care | Payer: PRIVATE HEALTH INSURANCE | Source: Ambulatory Visit | Attending: Radiation Oncology | Admitting: Radiation Oncology

## 2015-06-15 ENCOUNTER — Encounter: Payer: Self-pay | Admitting: Radiation Oncology

## 2015-06-15 VITALS — BP 120/77 | HR 68 | Temp 98.3°F | Resp 16 | Ht 73.0 in | Wt 178.1 lb

## 2015-06-15 DIAGNOSIS — Z87891 Personal history of nicotine dependence: Secondary | ICD-10-CM | POA: Diagnosis not present

## 2015-06-15 DIAGNOSIS — F1010 Alcohol abuse, uncomplicated: Secondary | ICD-10-CM | POA: Diagnosis not present

## 2015-06-15 DIAGNOSIS — C20 Malignant neoplasm of rectum: Secondary | ICD-10-CM | POA: Diagnosis not present

## 2015-06-15 DIAGNOSIS — Z841 Family history of disorders of kidney and ureter: Secondary | ICD-10-CM | POA: Diagnosis not present

## 2015-06-15 DIAGNOSIS — Z8042 Family history of malignant neoplasm of prostate: Secondary | ICD-10-CM | POA: Insufficient documentation

## 2015-06-15 DIAGNOSIS — K219 Gastro-esophageal reflux disease without esophagitis: Secondary | ICD-10-CM | POA: Insufficient documentation

## 2015-06-15 DIAGNOSIS — Z803 Family history of malignant neoplasm of breast: Secondary | ICD-10-CM | POA: Insufficient documentation

## 2015-06-15 DIAGNOSIS — Z833 Family history of diabetes mellitus: Secondary | ICD-10-CM | POA: Diagnosis not present

## 2015-06-15 DIAGNOSIS — Z8249 Family history of ischemic heart disease and other diseases of the circulatory system: Secondary | ICD-10-CM | POA: Insufficient documentation

## 2015-06-15 DIAGNOSIS — K649 Unspecified hemorrhoids: Secondary | ICD-10-CM | POA: Diagnosis not present

## 2015-06-15 HISTORY — DX: Malignant (primary) neoplasm, unspecified: C80.1

## 2015-06-15 MED ORDER — OXYCODONE-ACETAMINOPHEN 5-325 MG PO TABS: ORAL_TABLET | ORAL | Status: DC

## 2015-06-15 NOTE — Progress Notes (Signed)
Please see the Nurse Progress Note in the MD Initial Consult Encounter for this patient. 

## 2015-06-16 ENCOUNTER — Telehealth: Payer: Self-pay | Admitting: *Deleted

## 2015-06-16 ENCOUNTER — Telehealth: Payer: Self-pay | Admitting: Oncology

## 2015-06-16 ENCOUNTER — Encounter: Payer: Self-pay | Admitting: Radiation Oncology

## 2015-06-16 ENCOUNTER — Ambulatory Visit (HOSPITAL_BASED_OUTPATIENT_CLINIC_OR_DEPARTMENT_OTHER): Payer: PRIVATE HEALTH INSURANCE | Admitting: Oncology

## 2015-06-16 VITALS — BP 123/82 | HR 76 | Temp 98.3°F | Resp 18 | Ht 73.0 in | Wt 179.0 lb

## 2015-06-16 DIAGNOSIS — C20 Malignant neoplasm of rectum: Secondary | ICD-10-CM

## 2015-06-16 NOTE — Progress Notes (Signed)
[facility] Patient Consult   Referring MD: Jaxan Kitchens 65 y.o.  [birth year]    Reason for Referral: Rectal cancer   HPI: He presented to the emergency room 05/15/2015 with abdominal pain, nausea/vomiting, and diarrhea. He was seen in the emergency room the following day with abdominal pain and bloody diarrhea. A CT of the abdomen and pelvis on 05/15/2015 revealed a too small to characterize 6 mm right liver lesion. Irregular wall thickening was noted at the rectosigmoid junction extending over 6 cm. One prominent perirectal lymph node was noted. The prostate gland is enlarged.  He was referred to Dr. Fuller Plan and was taken to a colonoscopy 05/22/2015. A circumferential mass was found in the rectum at 10-15 cm from the anal verge. Multiple biopsies were performed. There was a sessile polyp at the cecum. Another polyp was removed from the descending colon. The pathology [id number]) confirmed multiple fragments of a tubulovillous adenoma at the cecum. The descending colon polyp returned as a tubular adenoma. The rectal mass biopsy returned as invasive adenocarcinoma.  He was referred to Dr. Marcello Moores. A staging CT of the chest 05/30/2015 revealed at least 7 subtle hypodense liver lesions and 3 pulmonary nodules. The liver lesions are suspicious for metastases. The pulmonary lesions are indeterminant.  He was referred to Dr. Ardis Hughs for an endoscopic ultrasound on 06/01/2015. The mass was measured at 7 cm from the anal verge. The mass penetrated into and through the muscularis propria and 1 suspicious perirectal lymph node was noted. The lesion was tattooed.  Dr. Marcello Moores referred him for an ultrasound-guided biopsy of a liver lesion 06/14/2015. No liver lesions were visible by ultrasound. He has been scheduled for an MRI 06/21/2015.  Mr. Gwyndolyn Saxon saw Dr. Lisbeth Renshaw yesterday. Dr. Lisbeth Renshaw recommends neoadjuvant chemotherapy/radiation if the liver MRI reveals no  metastases.  Past Medical History  Diagnosis Date  . Allergic rhinitis   . GERD (gastroesophageal reflux disease)   . Hemorrhoids   . Cancer [facility])  December 2016     rectal cancer    Past Surgical History  Procedure Laterality Date  . No past surgeries    . Eus N/A 06/01/2015    Procedure: LOWER ENDOSCOPIC ULTRASOUND (EUS);  Surgeon: Milus Banister, MD;  Location: [facility];  Service: Endoscopy;  Laterality: N/A;    Medications: Reviewed  Allergies: No Known Allergies  Family history: His father had prostate cancer. He has 4 brothers and 3 sisters. 2 sisters had breast cancer. A brother may have had colon cancer. A one half brother had prostate cancer.  Social History:   He is a [employer]. 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 He does not use cigarettes. He reports social alcohol use. No risk factor for HIV or hepatitis.  History  Alcohol Use  . 0.0 oz/week  . 0 Standard drinks or equivalent per week    Comment: occ    History  Smoking status  . Former Smoker -- 1.00 packs/day for 10 years  . Types: Cigarettes  . Quit date: 06/03/1978  Smokeless tobacco  . Never Used      ROS:   Positives include: He reports decreased oral intake secondary to a fear of having painful bowel movements with bleeding, rectal/abdominal pain for several months, occasional headaches in the evening  A complete ROS was otherwise negative.  Physical Exam:  Blood pressure 123/82, pulse 76, temperature 98.3 F (36.8 C), temperature source Oral, resp. rate 18,  height 6\' 1"  (1.854 m), weight 179 lb (81.194 kg), SpO2 100 %.  HEENT: Oropharynx without visible mass, neck without mass Lungs: Clear bilaterally Cardiac: Regular rate and rhythm Abdomen: No hepatosplenomegaly, no mass, nontender GU: Testes without mass  Vascular: No leg edema Lymph nodes: No cervical, supraclavicular, axillary, or inguinal nodes Neurologic: Alert and oriented, the motor  exam appears intact in the upper and lower extremities Skin: No rash Musculoskeletal: No spine tenderness   LAB:  CBC  Lab Results  Component Value Date   WBC 5.7 06/14/2015   HGB 9.6* 06/14/2015   HCT 31.3* 06/14/2015   MCV 85.3 06/14/2015   PLT 319 06/14/2015   NEUTROABS 2.8 05/16/2015     CMP      Component Value Date/Time   NA 138 05/16/2015 1346   K 3.5 05/16/2015 1346   CL 103 05/16/2015 1346   CO2 26 05/16/2015 1346   GLUCOSE 100* 05/16/2015 1346   BUN 13 05/16/2015 1346   CREATININE 1.19 05/16/2015 1346   CALCIUM 8.8* 05/16/2015 1346   PROT 7.5 05/16/2015 1346   ALBUMIN 3.7 05/16/2015 1346   AST 24 05/16/2015 1346   ALT 14* 05/16/2015 1346   ALKPHOS 58 05/16/2015 1346   BILITOT 0.4 05/16/2015 1346   GFRNONAA >60 05/16/2015 1346   GFRAA >60 05/16/2015 1346      Imaging:  As per history of present illness, CT abdomen/pelvis 05/15/2015 and a chest CT 05/30/2015 reviewed with Mr. Corgan    Assessment/Plan:   1. Rectal cancer, invasive adenocarcinoma confirmed at colonoscopy 05/22/2015, EUS staging 06/01/2015 revealed a uT3,N1 lesion at 7 cm from the anal verge  Staging CT abdomen/pelvis 05/15/2015 confirmed a mass at the rectosigmoid colon, a suspicious perirectal lymph node, and a 6 bony right liver lesion felt to most likely be a cyst  CT chest 05/30/2015 with indeterminate pulmonary nodules and multiple liver lesions suspicious for metastases  Ultrasound 06/13/2014 with no liver lesions identified   2. Multiple colon polyps on the colonoscopy 05/22/2015, tubular villous adenoma and a tubular adenoma were removed  3.   Anemia-likely secondary to rectal bleeding  4.   Family history of multiple cancers including breast and prostate cancer   Disposition:   Mr. Gens has been diagnosed with rectal cancer. He appears to have at least locally advanced disease based on the staging evaluation to date. I discussed the standard treatment schema  for rectal cancer with Mr. Leeds and his wife. I recommend neoadjuvant capecitabine and radiation if he does not have liver metastases. If he is confirmed to have liver metastases we will recommend systemic chemotherapy.  Mr. Bazen is scheduled for an MRI of the liver on 06/21/2015. He will return for an office visit 06/22/2015.  I reviewed the potential toxicities associated with capecitabine including the chance for nausea, mucositis, diarrhea, and hematologic toxicity. We discussed the sun sensitivity, hyperpigmentation, rash, and hand/foot syndrome associated with capecitabine.  Approximately 50 minutes were spent with the patient today. The majority of the time was used for counseling and coordination of care.  [place], [place] 06/16/2015, 2:14 PM

## 2015-06-16 NOTE — Telephone Encounter (Signed)
Called patient to inform of MRI on 06/21/15 , lvm for a return call

## 2015-06-16 NOTE — Progress Notes (Signed)
Radiation Oncology         [phone] ________________________________  Name: Hunter Walker MRN: [id number]  Date: 06/15/2015  DOB: 01-17-1951  CC:No PCP Per Patient  Milus Banister, MD     REFERRING PHYSICIAN: Milus Banister, MD   DIAGNOSIS: The primary encounter diagnosis was Rectal carcinoma [facility]). A diagnosis of Rectal cancer ([place]) - adenocarcinoma was also pertinent to this visit.  Rectal cancer ([place]) - adenocarcinoma   Staging form: Colon and Rectum, AJCC 7th Edition     Clinical: T3, N1 - Unsigned    HISTORY OF PRESENT ILLNESS::Hunter Walker is a 65 y.o. male who is seen for an initial consultation visit regarding the patient's diagnosis of rectal cancer.  The patient presented with initial symptoms of pelvic plain, bleeding, and constipation. These symptoms began a number of months ago dating back to possibly 2015 with some changes that he felt were related to hemorrhoids..  Current symptoms include ongoing pain, and bleeding.   Endoscopy/ colonoscopy has been performed. A rectal mass was noted. A complete colonoscopy beyond the tumor was able to be performed. A biospy was performed. This returned positive for adenocarcinoma.  An endoscopic ultrasound has been performed. Based on the this study, the tumor was staged as T 3 N 1. The distance from the anal verge was 7 cm. The length of the tumor was 5 cm. The tumor was described as a nearly circumferential.   Based on current imaging, a corresponding rectal tumor was seen. Evidence of significant bowel obstruction was not seen. Pelvic/ regional nodes was seen with at least 1 suspicious lymph node present. Distant metastatic disease was not seen. Suspicious findings in the liver were not identified on the patient's initial CT scan of the abdomen and pelvis. A subsequent CT scan of the chest was performed. This did not reveal any clear evidence of metastatic disease within the chest. 3 small pulmonary nodules were  present for which these can be followed. However, subtle hypodense liver masses were seen on this scan within the liver which were felt to be potentially suspicious for metastatic disease. The patient therefore proceeded to undergo an attempted ultrasound-guided biopsy. However, no lesions were seen on ultrasound and therefore an MRI scan of the abdomen was recommended.Marland Kitchen  PREVIOUS RADIATION THERAPY: No   PAST MEDICAL HISTORY:  has a past medical history of Allergic rhinitis; GERD (gastroesophageal reflux disease); Hemorrhoids; and Cancer ([place]).     PAST SURGICAL HISTORY: Past Surgical History  Procedure Laterality Date  . No past surgeries    . Eus N/A 06/01/2015    Procedure: LOWER ENDOSCOPIC ULTRASOUND (EUS);  Surgeon: Milus Banister, MD;  Location: [facility];  Service: Endoscopy;  Laterality: N/A;     FAMILY HISTORY: family history includes Breast cancer in his sister; Diabetes in his father, mother, and sister; Heart disease in his brother; Kidney disease in his mother; Prostate cancer in his brother and father. There is no history of Colon cancer.   SOCIAL HISTORY:  reports that he quit smoking about 37 years ago. His smoking use included Cigarettes. He has a 10 pack-year smoking history. He has never used smokeless tobacco. He reports that he drinks alcohol. He reports that he does not use illicit drugs.   ALLERGIES: Review of patient's allergies indicates no known allergies.   MEDICATIONS:  Current Outpatient Prescriptions  Medication Sig Dispense Refill  . acetaminophen (TYLENOL) 325 MG tablet Take 650 mg by mouth every 6 (six) hours as needed  for moderate pain.    . hydrocortisone (ANUSOL-HC) 2.5 % rectal cream Apply small amount of medication on hemorrhoid 2-4 times daily as needed for comfort 30 g 0  . docusate sodium (COLACE) 100 MG capsule Take 1 capsule (100 mg total) by mouth every 12 (twelve) hours. (Patient not taking: Reported on 06/15/2015) 60 capsule 0  .  famotidine (PEPCID) 20 MG tablet Take 1 tablet (20 mg total) by mouth 2 (two) times daily. (Patient not taking: Reported on 06/15/2015) 10 tablet 0  . oxyCODONE-acetaminophen (PERCOCET/ROXICET) 5-325 MG tablet 1 to 2 tabs PO q6hrs  PRN for pain 60 tablet 0  . sucralfate (CARAFATE) 1 GM/10ML suspension Take 10 mLs (1 g total) by mouth 4 (four) times daily -  with meals and at bedtime. (Patient not taking: Reported on 06/15/2015) 420 mL 0   No current facility-administered medications for this encounter.     REVIEW OF SYSTEMS:  A 15 point review of systems is documented in the electronic medical record. This was obtained by the nursing staff. However, I reviewed this with the patient to discuss relevant findings and make appropriate changes.  Pertinent items are noted in HPI.    PHYSICAL EXAM:  height is 6\' 1"  (1.854 m) and weight is 178 lb 1.6 oz (80.786 kg). His oral temperature is 98.3 F (36.8 C). His blood pressure is 120/77 and his pulse is 68. His respiration is 16.   ECOG = 1  0 - Asymptomatic (Fully active, able to carry on all predisease activities without restriction)  1 - Symptomatic but completely ambulatory (Restricted in physically strenuous activity but ambulatory and able to carry out work of a light or sedentary nature. For example, light housework, office work)  2 - Symptomatic, <50% in bed during the day (Ambulatory and capable of all self care but unable to carry out any work activities. Up and about more than 50% of waking hours)  3 - Symptomatic, >50% in bed, but not bedbound (Capable of only limited self-care, confined to bed or chair 50% or more of waking hours)  4 - Bedbound (Completely disabled. Cannot carry on any self-care. Totally confined to bed or chair)  5 - Death   Eustace Pen MM, Creech RH, Tormey DC, et al. [phone]). "Toxicity and response criteria of the [facility]". [place] Oncol. 5 (6): 649-55  General: Well-developed, in no acute  distress HEENT: Normocephalic, atraumatic; oral cavity clear Neck: Supple without any lymphadenopathy Cardiovascular: Regular rate and rhythm Respiratory: Clear to auscultation bilaterally GI: Soft, nontender, normal bowel sounds Extremities: No edema present Neuro: No focal deficits Lymphadenopathy:  Inguinal lymphadenopathy was not identified. Rectal:  Rectal tumor was not palpable. Bleeding was not present.     LABORATORY DATA:  Lab Results  Component Value Date   WBC 5.7 06/14/2015   HGB 9.6* 06/14/2015   HCT 31.3* 06/14/2015   MCV 85.3 06/14/2015   PLT 319 06/14/2015   Lab Results  Component Value Date   NA 138 05/16/2015   K 3.5 05/16/2015   CL 103 05/16/2015   CO2 26 05/16/2015   Lab Results  Component Value Date   ALT 14* 05/16/2015   AST 24 05/16/2015   ALKPHOS 58 05/16/2015   BILITOT 0.4 05/16/2015    CEA: Unavailable    RADIOGRAPHY: Ct Chest W Contrast  05/30/2015  CLINICAL DATA:  Newly diagnosed invasive rectal adenocarcinoma, presenting for chest staging. EXAM: CT CHEST WITH CONTRAST TECHNIQUE: Multidetector CT imaging of the  chest was performed during intravenous contrast administration. CONTRAST:  104mL ISOVUE-300 IOPAMIDOL (ISOVUE-300) INJECTION 61% COMPARISON:  05/15/2015 CT abdomen/pelvis.  No prior chest CT. FINDINGS: Mediastinum/Nodes: Normal heart size. No pericardial fluid/thickening. Great vessels are normal in course and caliber. No central pulmonary emboli. Normal visualized thyroid. Normal esophagus. No pathologically enlarged axillary, mediastinal or hilar lymph nodes. Lungs/Pleura: No pneumothorax. No pleural effusion. There are three pulmonary nodules in the right lung measuring 5 mm in the anterior right upper lobe (series 5/image 17), 2 mm in the right upper lobe (5/16) and 2 mm in the right lower lobe (5/44). No acute consolidative airspace disease or lung masses. Upper abdomen: There are at least seven scattered subtle hypodense liver masses  throughout the liver, largest 1.2 cm in the right liver lobe (series 2/ image 60) and 1.1 cm in the lateral segment left liver lobe (series 2/ image 55). Musculoskeletal:  No aggressive appearing focal osseous lesions. IMPRESSION: 1. At least seven scattered subtle hypodense liver masses throughout the liver, suspicious for liver metastases. Consider further characterization with MRI abdomen with and without intravenous contrast and/or PET-CT. 2. Three pulmonary nodules in the right lung, largest 5 mm in the right upper lobe, indeterminate, pulmonary metastases not excluded. Recommend attention on follow-up chest CT in 3 months. 3. No thoracic lymphadenopathy. Electronically Signed   By: Ilona Sorrel M.D.   On: 05/30/2015 13:52   US Abdomen Limited  06/14/2015  CLINICAL DATA:  Recent diagnosis of rectal adenocarcinoma with CT demonstrating subtle low-attenuation lesions in both right and left lobes of the liver. The patient presents for possible biopsy of one of the liver lesions. EXAM: LIMITED ABDOMINAL ULTRASOUND COMPARISON:  CT of the chest on 05/30/2015 and CT of the abdomen and pelvis on 05/15/2015. FINDINGS: Thorough sonographic evaluation was performed of the entire liver parenchyma. There are no visible discrete lesions identified in either the right or left lobes. Ultrasound-guided biopsy was therefore not possible today. IMPRESSION: No visible lesions in the liver by ultrasound to allow ultrasound-guided biopsy. Recommend further evaluation of the potential liver lesions with MRI of the abdomen with and without gadolinium. Electronically Signed   By: Aletta Edouard M.D.   On: 06/14/2015 14:54       IMPRESSION:  The patient may be an appropriate candidate for preoperative chemoradiation treatment. The patient's currently has a T3 N1 MX adenocarcinoma of the rectum. The lesion is 7 cm from the anal verge. However, on the patient's CT scan of the chest some suspicious areas were seen within the  liver. This requires further workup as an ultrasound did not show these lesions. I have ordered an MRI scan of the abdomen for further evaluation. Based on the information available today however, the patient and I discussed potential preoperative chemoradiation treatment followed by surgical resection in the event of nonmetastatic disease.  I discussed with the patient the rationale of radiation treatment in this setting. I discussed the benefit in terms of local/regional control and we also discussed how this can aid surgical resection. We also discussed the potential side effects and risks of treatment as well.   All of the patient's questions were answered. The patient wishes to proceed with radiation treatment at the appropriate time.   PLAN: I have ordered an MRI scan of the abdomen and the patient sees medical oncology tomorrow.  If no metastases are present within the liver, then the patient is suitable to proceed with preoperative chemoradiation treatment and he will be scheduled for a  simulation in the near future. In this setting,. I anticipate treating the patient to 50.4 Gy in 5-1/2 weeks. This will correspond to a 3-D conformal technique with daily optical guidance and weekly port films to help ensure accurate localization of the target volume.   The patient does have pelvic pain and some ongoing bleeding. He may be a candidate therefore for initial palliative chemoradiation treatment followed by additional systemic treatment. I can discuss this further with Dr. Benay Spice and medical oncology with the alternative being initial chemotherapy alone initially.   ________________________________   Jodelle Gross, MD, PhD   **Disclaimer: This note was dictated with voice recognition software. Similar sounding words can inadvertently be transcribed and this note may contain transcription errors which may not have been corrected upon publication of note.**

## 2015-06-16 NOTE — Telephone Encounter (Signed)
Pt confirmed appt. Received avs

## 2015-06-20 ENCOUNTER — Encounter: Payer: Self-pay | Admitting: *Deleted

## 2015-06-20 NOTE — Progress Notes (Signed)
[place] Clinical Social Work  Holiday representative contacted patient to discuss disability.  Patient was working prior to diagnosis.  CSW encouraged patient to contact HR through his employer to determine benefits.  CSW and patient also discussed Social Security Disability requirements and application process.  CSW provided patient with contact information for SSD.  Patient plans to call employer HR and SSD.  CSW encouraged patient to contact CSW if he has any difficulty of with additional questions or concerns.    Johnnye Lana, MSW, LCSW, OSW-C Clinical Social Worker [facility] [phone]

## 2015-06-21 ENCOUNTER — Ambulatory Visit (HOSPITAL_COMMUNITY)
Admission: RE | Admit: 2015-06-21 | Discharge: 2015-06-21 | Disposition: A | Discharge status: Home / Self Care | Payer: PRIVATE HEALTH INSURANCE | Source: Ambulatory Visit | Attending: Radiation Oncology | Admitting: Radiation Oncology

## 2015-06-21 DIAGNOSIS — R160 Hepatomegaly, not elsewhere classified: Secondary | ICD-10-CM | POA: Diagnosis not present

## 2015-06-21 DIAGNOSIS — C20 Malignant neoplasm of rectum: Secondary | ICD-10-CM | POA: Insufficient documentation

## 2015-06-21 DIAGNOSIS — K838 Other specified diseases of biliary tract: Secondary | ICD-10-CM | POA: Diagnosis not present

## 2015-06-21 MED ORDER — GADOBENATE DIMEGLUMINE 529 MG/ML IV SOLN
15.0000 mL | Freq: Once | INTRAVENOUS | Status: AC | PRN
  Administered 2015-06-21: 15 mL via INTRAVENOUS

## 2015-06-22 ENCOUNTER — Ambulatory Visit (HOSPITAL_BASED_OUTPATIENT_CLINIC_OR_DEPARTMENT_OTHER): Payer: PRIVATE HEALTH INSURANCE | Admitting: Oncology

## 2015-06-22 ENCOUNTER — Telehealth: Payer: Self-pay | Admitting: Oncology

## 2015-06-22 VITALS — BP 116/78 | HR 74 | Temp 98.7°F | Resp 18 | Ht 73.0 in | Wt 178.3 lb

## 2015-06-22 DIAGNOSIS — C20 Malignant neoplasm of rectum: Secondary | ICD-10-CM | POA: Diagnosis not present

## 2015-06-22 DIAGNOSIS — K769 Liver disease, unspecified: Secondary | ICD-10-CM | POA: Diagnosis not present

## 2015-06-22 DIAGNOSIS — R200 Anesthesia of skin: Secondary | ICD-10-CM

## 2015-06-22 NOTE — Telephone Encounter (Signed)
per pof to sch pt appt-cld & spoke to pt and gave pt time & date for appt °

## 2015-06-22 NOTE — Progress Notes (Signed)
[name]   Diagnosis: Rectal cancer  INTERVAL HISTORY:   [name] returns as scheduled. He underwent an MRI of the abdomen 06/22/2015. Approximately 10 liver masses were noted with the largest measuring 1.3 cm in the posterior right liver. The lesions have imaging characteristics of metastases. Borderline smooth dilatation of the main pancreatic duct was noted. The common bile duct is also minimally dilated.  He continues to have rectal pain and bleeding. He is having bowel movements. He reports a 5 minute episode of right arm numbness on 06/20/2015. No neck pain. This spontaneously resolved. No motor symptoms.   Objective:  Vital signs in last 24 hours:  Blood pressure 116/78, pulse 74, temperature 98.7 F (37.1 C), temperature source Oral, resp. rate 18, height 6\' 1"  (1.854 m), weight 178 lb 4.8 oz (80.876 kg), SpO2 100 %.   Physical examination-the strength is intact in both arms and hands.   Imaging:  Mr Abdomen W Wo Contrast  06/22/2015  CLINICAL DATA:  Recently diagnosed rectal carcinoma. Multiple small liver masses detected on staging chest CT. No liver masses could be visualized on recent attempted ultrasound-guided liver mass biopsy. EXAM: MRI ABDOMEN WITHOUT AND WITH CONTRAST TECHNIQUE: Multiplanar multisequence MR imaging of the abdomen was performed both before and after the administration of intravenous contrast. CONTRAST:  74mL MULTIHANCE GADOBENATE DIMEGLUMINE 529 MG/ML IV SOLN COMPARISON:  05/15/2015 CT abdomen/ pelvis.  05/30/2015 chest CT. FINDINGS: Lower chest: Clear lung bases. Hepatobiliary: Normal liver size and configuration. No hepatic steatosis. There are approximately 10 small similar-appearing liver masses scattered throughout the liver, largest 1.3 x 1.3 cm in the posterior right liver lobe near the junctions of segments 6 and 7 (series 5/ image 21), each of which demonstrates signal intensity equivalent to the spleen  on T1 and T2 imaging and thick circumferential arterial phase wall hyperenhancement, in keeping with liver metastases, which have not appreciably changed in size since 05/30/2015. There is a simple 0.9 cm liver cyst in the segment 6 right liver lobe. Normal gallbladder with no cholelithiasis. No intrahepatic biliary ductal dilatation. Minimally dilated common bile duct (7 mm diameter), with smooth distal tapering. No evidence of choledocholithiasis. No evidence of an enhancing biliary or ampullary mass. Pancreas: There is borderline mild smooth diffuse dilatation of the main pancreatic duct, which measures 2-3 mm in diameter. No pancreatic mass. No pancreas divisum. Spleen: Normal size. No mass. Adrenals/Urinary Tract: Normal adrenals. No hydronephrosis. Normal kidneys with no renal mass. Stomach/Bowel: Grossly normal stomach. Visualized small and large bowel is normal caliber, with no bowel wall thickening. Vascular/Lymphatic: Normal caliber abdominal aorta. Patent portal, splenic, hepatic and renal veins. No pathologically enlarged lymph nodes in the abdomen. Other: No abdominal ascites or focal fluid collection. Musculoskeletal: No aggressive appearing focal osseous lesions. IMPRESSION: 1. Approximately 10 similar-appearing liver masses scattered throughout the liver, largest 1.3 cm in the posterior right liver lobe, with MRI features characteristic of liver metastases. 2. Minimally dilated common bile duct (7 mm diameter) with smooth distal tapering. No intrahepatic biliary ductal dilatation. No evidence of choledocholithiasis or obstructing mass. Mild smooth diffuse main pancreatic duct dilation, with no pancreatic mass. No MRI evidence of an ampullary mass. Ampullary stricture cannot be excluded. Recommend correlation with serum bilirubin levels. 3. No abdominal lymphadenopathy. Electronically Signed   By: Ilona Sorrel M.D.   On: 06/22/2015 09:08   Images reviewed with Hunter Walker Medications: I have  reviewed the patient's current medications.  Assessment/Plan: 1. Rectal cancer, invasive  adenocarcinoma confirmed at colonoscopy 05/22/2015, EUS staging 06/01/2015 revealed a uT3,N1 lesion at 7 cm from the anal verge  Staging CT abdomen/pelvis 05/15/2015 confirmed a mass at the rectosigmoid colon, a suspicious perirectal lymph node, and a 6 mm right liver lesion felt to most likely be a cyst  CT chest 05/30/2015 with indeterminate pulmonary nodules and multiple liver lesions suspicious for metastases  Ultrasound 06/13/2014 with no liver lesions identified  MRI abdomen 06/21/2015 consistent with multiple liver metastases   2. Multiple colon polyps on the colonoscopy 05/22/2015, tubular villous adenoma and a tubular adenoma were removed  3. Anemia-likely secondary to rectal bleeding  4. Family history of multiple cancers including breast and prostate cancer  5.   Transient right arm numbness 06/20/2015   Disposition:  Hunter Walker appears stable. He will contact us for recurrent right arm numbness. We will obtain a brain scan if this occurs again.  Multiple liver lesions are seen on the MRI consistent with liver metastases. I reviewed the images with Hunter Walker. I discussed the case with interventional radiology. He will be scheduled for a CT-guided biopsy of a liver lesion. The plan is to proceed with systemic therapy if he is proven to have metastatic disease involving the liver.  Hunter Walker will return for an office visit 06/30/2015.    Betsy Coder, MD  06/22/2015  11:52 AM

## 2015-06-23 ENCOUNTER — Other Ambulatory Visit: Payer: Self-pay | Admitting: Radiology

## 2015-06-26 ENCOUNTER — Ambulatory Visit (HOSPITAL_COMMUNITY)
Admission: RE | Admit: 2015-06-26 | Discharge: 2015-06-26 | Disposition: A | Discharge status: Home / Self Care | Payer: PRIVATE HEALTH INSURANCE | Source: Ambulatory Visit | Attending: Oncology | Admitting: Oncology

## 2015-06-26 ENCOUNTER — Encounter (HOSPITAL_COMMUNITY): Payer: Self-pay

## 2015-06-26 DIAGNOSIS — K769 Liver disease, unspecified: Secondary | ICD-10-CM | POA: Insufficient documentation

## 2015-06-26 DIAGNOSIS — C787 Secondary malignant neoplasm of liver and intrahepatic bile duct: Secondary | ICD-10-CM | POA: Diagnosis not present

## 2015-06-26 DIAGNOSIS — C20 Malignant neoplasm of rectum: Secondary | ICD-10-CM | POA: Insufficient documentation

## 2015-06-26 HISTORY — PX: LIVER BIOPSY: SHX301

## 2015-06-26 LAB — APTT: aPTT: 26 seconds (ref 24–37)

## 2015-06-26 LAB — CBC
HEMATOCRIT: 30.3 % — AB (ref 39.0–52.0)
HEMOGLOBIN: 9.7 g/dL — AB (ref 13.0–17.0)
MCH: 26.8 pg (ref 26.0–34.0)
MCHC: 32 g/dL (ref 30.0–36.0)
MCV: 83.7 fL (ref 78.0–100.0)
PLATELETS: 304 10*3/uL (ref 150–400)
RBC: 3.62 MIL/uL — AB (ref 4.22–5.81)
RDW: 15.4 % (ref 11.5–15.5)
WBC: 8.7 10*3/uL (ref 4.0–10.5)

## 2015-06-26 LAB — PROTIME-INR
INR: 1.09 (ref 0.00–1.49)
Prothrombin Time: 14.3 seconds (ref 11.6–15.2)

## 2015-06-26 MED ORDER — LIDOCAINE HCL 1 % IJ SOLN
INTRAMUSCULAR | Status: AC
Start: 2015-06-26 — End: 2015-06-26
  Filled 2015-06-26: qty 20

## 2015-06-26 MED ORDER — HYDROCODONE-ACETAMINOPHEN 5-325 MG PO TABS: 1.0000 | ORAL_TABLET | ORAL | Status: DC | PRN

## 2015-06-26 MED ORDER — MIDAZOLAM HCL 2 MG/2ML IJ SOLN
INTRAMUSCULAR | Status: AC | PRN
  Administered 2015-06-26: 1 mg via INTRAVENOUS

## 2015-06-26 MED ORDER — MIDAZOLAM HCL 2 MG/2ML IJ SOLN
INTRAMUSCULAR | Status: AC
  Filled 2015-06-26: qty 2

## 2015-06-26 MED ORDER — FENTANYL CITRATE (PF) 100 MCG/2ML IJ SOLN
INTRAMUSCULAR | Status: AC
  Filled 2015-06-26: qty 2

## 2015-06-26 MED ORDER — SODIUM CHLORIDE 0.9 % IV SOLN: Freq: Once | INTRAVENOUS | Status: DC

## 2015-06-26 MED ORDER — FENTANYL CITRATE (PF) 100 MCG/2ML IJ SOLN
INTRAMUSCULAR | Status: AC | PRN
  Administered 2015-06-26: 50 ug via INTRAVENOUS

## 2015-06-26 NOTE — Sedation Documentation (Signed)
Procedure start, pt denies pain at this time.

## 2015-06-26 NOTE — Procedures (Signed)
CT core biopsy liver lesion [id number] x3 No complication No blood loss. See complete dictation in [facility].

## 2015-06-26 NOTE — Sedation Documentation (Signed)
Patient is resting comfortably. Vitals stable. 

## 2015-06-26 NOTE — Discharge Instructions (Signed)

## 2015-06-26 NOTE — Sedation Documentation (Signed)
Vital signs stable. Pt denies pain at this time. Pt resting comfortably.

## 2015-06-26 NOTE — Sedation Documentation (Signed)
Patient is resting comfortably. No c/o pain

## 2015-06-26 NOTE — H&P (Signed)
Chief Complaint: Patient was seen in consultation today for liver lesion biopsy at the request of Sherrill,Gary B  Referring Physician(s): Sherrill,Gary B  History of Present Illness: Hunter Walker is a 65 y.o. male   Recent diagnosis of Rectal cancer Referred to Dr Benay Spice for evaluation and treatment Staging imaging reveals: MRI 06/21/2015: IMPRESSION: 1. Approximately 10 similar-appearing liver masses scattered throughout the liver, largest 1.3 cm in the posterior right liver lobe, with MRI features characteristic of liver metastases. 2. Minimally dilated common bile duct (7 mm diameter) with smooth distal tapering. No intrahepatic biliary ductal dilatation. No evidence of choledocholithiasis or obstructing mass. Mild smooth diffuse main pancreatic duct dilation, with no pancreatic mass. No MRI evidence of an ampullary mass. Ampullary stricture cannot be excluded. Recommend correlation with serum bilirubin levels. 3. No abdominal lymphadenopathy  Pt referred to IR for tissue sampling of same     Past Medical History  Diagnosis Date  . Allergic rhinitis   . GERD (gastroesophageal reflux disease)   . Hemorrhoids   . Cancer [facility])     rectal cancer    Past Surgical History  Procedure Laterality Date  . No past surgeries    . Eus N/A 06/01/2015    Procedure: LOWER ENDOSCOPIC ULTRASOUND (EUS);  Surgeon: Milus Banister, MD;  Location: [facility];  Service: Endoscopy;  Laterality: N/A;    Allergies: Review of patient's allergies indicates no known allergies.  Medications: Prior to Admission medications   Medication Sig Start Date End Date Taking? Authorizing Provider  acetaminophen (TYLENOL) 325 MG tablet Take 650 mg by mouth every 6 (six) hours as needed for moderate pain.   Yes Historical Provider, MD  hydrocortisone (ANUSOL-HC) 2.5 % rectal cream Apply small amount of medication on hemorrhoid 2-4 times daily as needed for comfort 03/20/15  Yes Sharlett Iles, MD  oxyCODONE-acetaminophen (PERCOCET/ROXICET) 5-325 MG tablet 1 to 2 tabs PO q6hrs  PRN for pain 06/15/15  Yes Kyung Rudd, MD     Family History  Problem Relation Age of Onset  . Breast cancer Sister     x 2  . Diabetes Mother   . Kidney disease Mother   . Prostate cancer Father   . Diabetes Father   . Prostate cancer Brother   . Diabetes Sister   . Heart disease Brother   . Colon cancer Neg Hx     Social History   Social History  . Marital Status: Married    Spouse Name: N/A  . Number of Children: 2  . Years of Education: N/A   Occupational History  . teacher    Social History Main Topics  . Smoking status: Former Smoker -- 1.00 packs/day for 10 years    Types: Cigarettes    Quit date: 06/03/1978  . Smokeless tobacco: Never Used  . Alcohol Use: 0.0 oz/week    0 Standard drinks or equivalent per week     Comment: occ  . Drug Use: No  . Sexual Activity: Not Asked   Other Topics Concern  . None   Social History Narrative   Married, wife Sharyn Lull   #2 daughters: Mendel Ryder and Waynard Reeds       Review of Systems: A 12 point ROS discussed and pertinent positives are indicated in the HPI above.  All other systems are negative.  Review of Systems  Constitutional: Positive for appetite change. Negative for fever and activity change.  Respiratory: Negative for shortness of breath.   Gastrointestinal: Positive  for abdominal pain. Negative for nausea and diarrhea.  Musculoskeletal: Negative for back pain.  Neurological: Positive for weakness.  Psychiatric/Behavioral: Negative for behavioral problems and confusion.    Vital Signs: BP 124/76 mmHg  Pulse 66  Temp(Src) 97.9 F (36.6 C) (Oral)  Resp 18  Ht 6\' 1"  (1.854 m)  Wt 180 lb (81.647 kg)  BMI 23.75 kg/m2  SpO2 97%  Physical Exam  Constitutional: He is oriented to person, place, and time.  Cardiovascular: Normal rate and regular rhythm.   No murmur heard. Pulmonary/Chest: Effort normal  and breath sounds normal. He has no wheezes.  Abdominal: Soft. Bowel sounds are normal. There is tenderness.  Musculoskeletal: Normal range of motion.  Neurological: He is alert and oriented to person, place, and time.  Skin: Skin is warm and dry.  Psychiatric: He has a normal mood and affect. His behavior is normal. Judgment and thought content normal.  Nursing note and vitals reviewed.   Mallampati Score:  MD Evaluation Airway: WNL Heart: WNL Abdomen: WNL Chest/ Lungs: WNL ASA  Classification: 3 Mallampati/Airway Score: Two  Imaging: Ct Chest W Contrast  05/30/2015  CLINICAL DATA:  Newly diagnosed invasive rectal adenocarcinoma, presenting for chest staging. EXAM: CT CHEST WITH CONTRAST TECHNIQUE: Multidetector CT imaging of the chest was performed during intravenous contrast administration. CONTRAST:  9mL ISOVUE-300 IOPAMIDOL (ISOVUE-300) INJECTION 61% COMPARISON:  05/15/2015 CT abdomen/pelvis.  No prior chest CT. FINDINGS: Mediastinum/Nodes: Normal heart size. No pericardial fluid/thickening. Great vessels are normal in course and caliber. No central pulmonary emboli. Normal visualized thyroid. Normal esophagus. No pathologically enlarged axillary, mediastinal or hilar lymph nodes. Lungs/Pleura: No pneumothorax. No pleural effusion. There are three pulmonary nodules in the right lung measuring 5 mm in the anterior right upper lobe (series 5/image 17), 2 mm in the right upper lobe (5/16) and 2 mm in the right lower lobe (5/44). No acute consolidative airspace disease or lung masses. Upper abdomen: There are at least seven scattered subtle hypodense liver masses throughout the liver, largest 1.2 cm in the right liver lobe (series 2/ image 60) and 1.1 cm in the lateral segment left liver lobe (series 2/ image 55). Musculoskeletal:  No aggressive appearing focal osseous lesions. IMPRESSION: 1. At least seven scattered subtle hypodense liver masses throughout the liver, suspicious for liver  metastases. Consider further characterization with MRI abdomen with and without intravenous contrast and/or PET-CT. 2. Three pulmonary nodules in the right lung, largest 5 mm in the right upper lobe, indeterminate, pulmonary metastases not excluded. Recommend attention on follow-up chest CT in 3 months. 3. No thoracic lymphadenopathy. Electronically Signed   By: Ilona Sorrel M.D.   On: 05/30/2015 13:52   Mr Abdomen W Wo Contrast  06/22/2015  CLINICAL DATA:  Recently diagnosed rectal carcinoma. Multiple small liver masses detected on staging chest CT. No liver masses could be visualized on recent attempted ultrasound-guided liver mass biopsy. EXAM: MRI ABDOMEN WITHOUT AND WITH CONTRAST TECHNIQUE: Multiplanar multisequence MR imaging of the abdomen was performed both before and after the administration of intravenous contrast. CONTRAST:  54mL MULTIHANCE GADOBENATE DIMEGLUMINE 529 MG/ML IV SOLN COMPARISON:  05/15/2015 CT abdomen/ pelvis.  05/30/2015 chest CT. FINDINGS: Lower chest: Clear lung bases. Hepatobiliary: Normal liver size and configuration. No hepatic steatosis. There are approximately 10 small similar-appearing liver masses scattered throughout the liver, largest 1.3 x 1.3 cm in the posterior right liver lobe near the junctions of segments 6 and 7 (series 5/ image 21), each of which demonstrates signal intensity equivalent to  the spleen on T1 and T2 imaging and thick circumferential arterial phase wall hyperenhancement, in keeping with liver metastases, which have not appreciably changed in size since 05/30/2015. There is a simple 0.9 cm liver cyst in the segment 6 right liver lobe. Normal gallbladder with no cholelithiasis. No intrahepatic biliary ductal dilatation. Minimally dilated common bile duct (7 mm diameter), with smooth distal tapering. No evidence of choledocholithiasis. No evidence of an enhancing biliary or ampullary mass. Pancreas: There is borderline mild smooth diffuse dilatation of the  main pancreatic duct, which measures 2-3 mm in diameter. No pancreatic mass. No pancreas divisum. Spleen: Normal size. No mass. Adrenals/Urinary Tract: Normal adrenals. No hydronephrosis. Normal kidneys with no renal mass. Stomach/Bowel: Grossly normal stomach. Visualized small and large bowel is normal caliber, with no bowel wall thickening. Vascular/Lymphatic: Normal caliber abdominal aorta. Patent portal, splenic, hepatic and renal veins. No pathologically enlarged lymph nodes in the abdomen. Other: No abdominal ascites or focal fluid collection. Musculoskeletal: No aggressive appearing focal osseous lesions. IMPRESSION: 1. Approximately 10 similar-appearing liver masses scattered throughout the liver, largest 1.3 cm in the posterior right liver lobe, with MRI features characteristic of liver metastases. 2. Minimally dilated common bile duct (7 mm diameter) with smooth distal tapering. No intrahepatic biliary ductal dilatation. No evidence of choledocholithiasis or obstructing mass. Mild smooth diffuse main pancreatic duct dilation, with no pancreatic mass. No MRI evidence of an ampullary mass. Ampullary stricture cannot be excluded. Recommend correlation with serum bilirubin levels. 3. No abdominal lymphadenopathy. Electronically Signed   By: Ilona Sorrel M.D.   On: 06/22/2015 09:08   US Abdomen Limited  06/14/2015  CLINICAL DATA:  Recent diagnosis of rectal adenocarcinoma with CT demonstrating subtle low-attenuation lesions in both right and left lobes of the liver. The patient presents for possible biopsy of one of the liver lesions. EXAM: LIMITED ABDOMINAL ULTRASOUND COMPARISON:  CT of the chest on 05/30/2015 and CT of the abdomen and pelvis on 05/15/2015. FINDINGS: Thorough sonographic evaluation was performed of the entire liver parenchyma. There are no visible discrete lesions identified in either the right or left lobes. Ultrasound-guided biopsy was therefore not possible today. IMPRESSION: No visible  lesions in the liver by ultrasound to allow ultrasound-guided biopsy. Recommend further evaluation of the potential liver lesions with MRI of the abdomen with and without gadolinium. Electronically Signed   By: Aletta Edouard M.D.   On: 06/14/2015 14:54    Labs:  CBC:  Recent Labs  05/15/15 1020 05/16/15 1346 06/14/15 1238 06/26/15 0830  WBC 5.6 5.0 5.7 8.7  HGB 11.6* 11.1* 9.6* 9.7*  HCT 36.8* 35.2* 31.3* 30.3*  PLT 304 277 319 304    COAGS:  Recent Labs  06/14/15 1238  INR 1.09  APTT 22*    BMP:  Recent Labs  05/15/15 1020 05/16/15 1346  NA 136 138  K 4.1 3.5  CL 101 103  CO2 29 26  GLUCOSE 119* 100*  BUN 16 13  CALCIUM 8.7* 8.8*  CREATININE 1.28* 1.19  GFRNONAA 58* >60  GFRAA >60 >60    LIVER FUNCTION TESTS:  Recent Labs  05/15/15 1020 05/16/15 1346  BILITOT 0.5 0.4  AST 22 24  ALT 15* 14*  ALKPHOS 62 58  PROT 7.7 7.5  ALBUMIN 3.8 3.7    TUMOR MARKERS: No results for input(s): AFPTM, CEA, CA199, CHROMGRNA in the last 8760 hours.  Assessment and Plan:  Recent diagnosis rectal ca Staging reveals liver lesions Now scheduled for biopsy of same Risks and  Benefits discussed with the patient including, but not limited to bleeding, infection, damage to adjacent structures or low yield requiring additional tests. All of the patient's questions were answered, patient is agreeable to proceed. Consent signed and in chart.   Thank you for this interesting consult.  I greatly enjoyed meeting Hunter Walker and look forward to participating in their care.  A copy of this report was sent to the requesting provider on this date.  Electronically Signed: Duron Meister A 06/26/2015, 9:00 AM   I spent a total of  30 Minutes   in face to face in clinical consultation, greater than 50% of which was counseling/coordinating care for liver lesion biopsy

## 2015-06-27 ENCOUNTER — Other Ambulatory Visit: Payer: Self-pay | Admitting: Nurse Practitioner

## 2015-06-27 ENCOUNTER — Telehealth: Payer: Self-pay | Admitting: Nurse Practitioner

## 2015-06-27 NOTE — Telephone Encounter (Signed)
per pof to r/s appt-r/s and cld & spoke to pt and gave pt time *date of appt

## 2015-06-29 ENCOUNTER — Ambulatory Visit (INDEPENDENT_AMBULATORY_CARE_PROVIDER_SITE_OTHER): Payer: PRIVATE HEALTH INSURANCE | Admitting: Internal Medicine

## 2015-06-29 ENCOUNTER — Telehealth: Payer: Self-pay | Admitting: Nurse Practitioner

## 2015-06-29 ENCOUNTER — Telehealth: Payer: Self-pay | Admitting: *Deleted

## 2015-06-29 ENCOUNTER — Ambulatory Visit (HOSPITAL_BASED_OUTPATIENT_CLINIC_OR_DEPARTMENT_OTHER): Payer: PRIVATE HEALTH INSURANCE | Admitting: Nurse Practitioner

## 2015-06-29 ENCOUNTER — Encounter: Payer: Self-pay | Admitting: Internal Medicine

## 2015-06-29 ENCOUNTER — Encounter: Payer: Self-pay | Admitting: Nurse Practitioner

## 2015-06-29 VITALS — BP 122/72 | HR 82 | Temp 98.4°F | Resp 16 | Ht 73.0 in | Wt 175.8 lb

## 2015-06-29 VITALS — BP 119/83 | HR 70 | Temp 97.2°F | Resp 18 | Ht 73.0 in | Wt 175.5 lb

## 2015-06-29 DIAGNOSIS — K219 Gastro-esophageal reflux disease without esophagitis: Secondary | ICD-10-CM | POA: Insufficient documentation

## 2015-06-29 DIAGNOSIS — C20 Malignant neoplasm of rectum: Secondary | ICD-10-CM | POA: Diagnosis not present

## 2015-06-29 MED ORDER — PANTOPRAZOLE SODIUM 40 MG PO TBEC: 40.0000 mg | DELAYED_RELEASE_TABLET | Freq: Every day | ORAL | Status: DC

## 2015-06-29 NOTE — Assessment & Plan Note (Signed)
Talked with him about symptoms and symptom management with his upcoming chemo. He does understand that they did find lesions in his liver which are cancer.

## 2015-06-29 NOTE — Progress Notes (Signed)
fmla forms placed in my box

## 2015-06-29 NOTE — Assessment & Plan Note (Signed)
Rx for protonix that he can start taking daily. Given information about dietary choices to avoid symptoms.

## 2015-06-29 NOTE — Telephone Encounter (Signed)
Pt confirmed labs/ov per 01/26 POF, gave pt AVS and Calendar.Cherylann Banas, sent msg to add chemo

## 2015-06-29 NOTE — Telephone Encounter (Signed)
Per staff message and POF I have scheduled appts. Advised scheduler of appts and first available on 2/6 given. [name]

## 2015-06-29 NOTE — Progress Notes (Addendum)
Hunter Walker OFFICE PROGRESS NOTE   Diagnosis:  Rectal cancer  INTERVAL HISTORY:   Hunter Walker returns as scheduled. He continues to have pain and blood with bowel movements. He is taking a stool softener. He takes Percocet about every 6 hours. He describes his appetite as "pretty good". He has occasional nausea. He notes some abdominal "soreness" since the biopsy procedure.  Objective:  Vital signs in last 24 hours:  Blood pressure 119/83, pulse 70, temperature 97.2 F (36.2 C), temperature source Oral, resp. rate 18, height 6\' 1"  (1.854 m), weight 175 lb 8 oz (79.606 kg), SpO2 100 %.    HEENT: No thrush or ulcers. Resp: Lungs clear bilaterally. Cardio: Regular rate and rhythm. GI: Abdomen soft and nontender. No hepatomegaly. Vascular: No leg edema.   Lab Results:  Lab Results  Component Value Date   WBC 8.7 06/26/2015   HGB 9.7* 06/26/2015   HCT 30.3* 06/26/2015   MCV 83.7 06/26/2015   PLT 304 06/26/2015   NEUTROABS 2.8 05/16/2015    Imaging:  No results found.  Medications: I have reviewed the patient's current medications.  Assessment/Plan: 1. Rectal cancer, invasive adenocarcinoma confirmed at colonoscopy 05/22/2015, EUS staging 06/01/2015 revealed a uT3,N1 lesion at 7 cm from the anal verge  Staging CT abdomen/pelvis 05/15/2015 confirmed a mass at the rectosigmoid colon, a suspicious perirectal lymph node, and a 6 mm right liver lesion felt to most likely be a cyst  CT chest 05/30/2015 with indeterminate pulmonary nodules and multiple liver lesions suspicious for metastases  Ultrasound 06/13/2014 with no liver lesions identified  MRI abdomen 06/21/2015 consistent with multiple liver metastases  CT biopsy of liver lesion 06/26/2015. Pathology showed metastatic adenocarcinoma consistent with colonic primary.   2. Multiple colon polyps on the colonoscopy 05/22/2015, tubular villous adenoma and a tubular adenoma were  removed 3. Anemia-likely secondary to rectal bleeding 4. Family history of multiple cancers including breast and prostate cancer 5. Transient right arm numbness 06/20/2015   Disposition: Hunter Walker has metastatic rectal cancer. We reviewed/discussed the pathology report from the liver biopsy which confirmed metastatic disease. He understands no therapy will be curative. Hunter Walker recommends systemic therapy. We discussed the FOLFOX and FOLFIRI regimens and Avastin. We reviewed potential toxicities associated with chemotherapy including myelosuppression, nausea, mouth sores, diarrhea, allergic reaction, hair loss. We reviewed potential toxicities associated with 5-fluorouracil including mouth sores, diarrhea, hand-foot syndrome, rash, increased sensitivity to sun, conjunctivitis. We discussed the neurotoxicity associated with oxaliplatin including cold sensitivity, peripheral neuropathy and acute laryngopharyngeal dysesthesia. We also discussed the possibility of more rare occurrences such as diplopia, incontinence, ataxia. We discussed both early and late phase diarrhea associated with irinotecan. He understands the diarrhea could be severe. We reviewed potential toxicities associated with Avastin including an allergic reaction, hypertension, bleeding, bowel perforation, increased risk of blood clots, reversible posterior leukoencephalopathy syndrome. He is agreeable to proceed. He will attend a chemotherapy education class. We will contact Dr. Marcello Walker regarding placement of a Port-A-Cath.  We will see him in follow-up prior to proceeding with cycle #1 on 07/10/2015. He will contact the office in the interim with any problems.  Patient seen with Hunter Walker. 45 minutes were spent face-to-face at today's visit with the majority of that time involved in counseling/coordination of care.    Ned Card ANP/GNP-BC   06/29/2015  11:37 AM   This was a shared visit with Ned Card. Hunter Walker has  been diagnosed with metastatic rectal cancer. We discussed treatment options with  Hunter Walker and his wife. He does not wish to consider enrollment on the [facility] genotype directed irinotecan dosing study. We discussed standard treatment with FOLFIRI/Avastin and FOLFOX/Avastin. We reviewed the potential toxicities associated with these regimens. He is comfortable proceeding with FOLFOX/Avastin.  He will be restaged after 5-6 cycles. We can then make a decision regarding the indication for surgery.  Foundation 1 testing is pending.

## 2015-06-29 NOTE — Progress Notes (Signed)
   Subjective:    Patient ID: Hunter Walker, male    DOB: 07-Jul-1950, 65 y.o.   MRN: [id number]  HPI The patient is a new 65 YO man coming in for sour taste in his mouth. He has suspected that it is acid reflux. He has not really taken anything for it. He is worried that this will get worse since he is starting chemotherapy soon for his newly diagnosed stage 4 colorectal cancer that has metastases in the liver. He is still struggling with the diagnosis but is hopeful that his upcoming treatment will help shrink things. 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   [place], [facility], social history reviewed and updated.   Review of Systems  Constitutional: Positive for activity change. Negative for fever, appetite change and fatigue.  HENT: Negative.   Eyes: Negative.   Respiratory: Negative.   Cardiovascular: Negative for chest pain, palpitations and leg swelling.  Gastrointestinal: Positive for abdominal pain, blood in stool and anal bleeding. Negative for nausea, diarrhea, constipation and abdominal distention.       GERD  Musculoskeletal: Negative.   Skin: Negative.   Neurological: Negative.   Psychiatric/Behavioral: Negative.       Objective:   Physical Exam  Constitutional: He is oriented to person, place, and time. He appears well-developed and well-nourished.  HENT:  Head: Normocephalic and atraumatic.  Eyes: EOM are normal.  Neck: Normal range of motion.  Cardiovascular: Normal rate and regular rhythm.   Pulmonary/Chest: Effort normal and breath sounds normal. No respiratory distress. He has no wheezes. He has no rales.  Abdominal: Soft. Bowel sounds are normal. He exhibits no distension. There is no tenderness.  Musculoskeletal: He exhibits no edema.  Neurological: He is alert and oriented to person, place, and time. Coordination normal.  Skin: Skin is warm and dry.  Psychiatric: He has a normal mood and affect.   Filed Vitals:   06/29/15 1402  BP: 122/72  Pulse: 82   Temp: 98.4 F (36.9 C)  TempSrc: Oral  Resp: 16  Height: 6\' 1"  (1.854 m)  Weight: 175 lb 12.8 oz (79.742 kg)  SpO2: 96%      Assessment & Plan:

## 2015-06-29 NOTE — Patient Instructions (Signed)
We have sent in protonix for the stomach. You can take it daily before breakfast to help with the symptoms.   Food Choices for Gastroesophageal Reflux Disease, Adult When you have gastroesophageal reflux disease (GERD), the foods you eat and your eating habits are very important. Choosing the right foods can help ease the discomfort of GERD. WHAT GENERAL GUIDELINES DO I NEED TO FOLLOW?  Choose fruits, vegetables, whole grains, low-fat dairy products, and low-fat meat, fish, and poultry.  Limit fats such as oils, salad dressings, butter, nuts, and avocado.  Keep a food diary to identify foods that cause symptoms.  Avoid foods that cause reflux. These may be different for different people.  Eat frequent small meals instead of three large meals each day.  Eat your meals slowly, in a relaxed setting.  Limit fried foods.  Cook foods using methods other than frying.  Avoid drinking alcohol.  Avoid drinking large amounts of liquids with your meals.  Avoid bending over or lying down until 2-3 hours after eating. WHAT FOODS ARE NOT RECOMMENDED? The following are some foods and drinks that may worsen your symptoms: Vegetables Tomatoes. Tomato juice. Tomato and spaghetti sauce. Chili peppers. Onion and garlic. Horseradish. Fruits Oranges, grapefruit, and lemon (fruit and juice). Meats High-fat meats, fish, and poultry. This includes hot dogs, ribs, ham, sausage, salami, and bacon. Dairy Whole milk and chocolate milk. Sour cream. Cream. Butter. Ice cream. Cream cheese.  Beverages Coffee and tea, with or without caffeine. Carbonated beverages or energy drinks. Condiments Hot sauce. Barbecue sauce.  Sweets/Desserts Chocolate and cocoa. Donuts. Peppermint and spearmint. Fats and Oils High-fat foods, including [place] fries and potato chips. Other Vinegar. Strong spices, such as black pepper, white pepper, red pepper, cayenne, curry powder, cloves, ginger, and chili powder. The items  listed above may not be a complete list of foods and beverages to avoid. Contact your dietitian for more information.   This information is not intended to replace advice given to you by your health care provider. Make sure you discuss any questions you have with your health care provider.   Document Released: 05/20/2005 Document Revised: 06/10/2014 Document Reviewed: 03/24/2013 Elsevier Interactive Patient Education [employer].

## 2015-06-29 NOTE — Progress Notes (Signed)
Pre visit review using our clinic review tool, if applicable. No additional management support is needed unless otherwise documented below in the visit note. 

## 2015-06-30 ENCOUNTER — Ambulatory Visit: Payer: PRIVATE HEALTH INSURANCE | Admitting: Nurse Practitioner

## 2015-06-30 ENCOUNTER — Encounter: Payer: Self-pay | Admitting: Oncology

## 2015-06-30 NOTE — Progress Notes (Signed)
Iplaced form for dr Benay Spice to sign

## 2015-07-03 ENCOUNTER — Ambulatory Visit: Payer: PRIVATE HEALTH INSURANCE

## 2015-07-03 DIAGNOSIS — C20 Malignant neoplasm of rectum: Secondary | ICD-10-CM

## 2015-07-03 MED ORDER — LIDOCAINE-PRILOCAINE 2.5-2.5 % EX CREA: 1.0000 "application " | TOPICAL_CREAM | CUTANEOUS | Status: DC | PRN

## 2015-07-03 NOTE — Patient Instructions (Addendum)
Hunter Walker  07/03/2015   Your procedure is scheduled on: 07-06-15  Report to [facility] Main  Entrance take [facility]  elevators to 3rd floor to  [place] at 130 pm  Call this number if you have problems the morning of surgery [phone]   Remember: ONLY 1 PERSON MAY GO WITH YOU TO SHORT STAY TO GET  READY MORNING OF YOUR SURGERY.   Do not eat food  :After Midnight, MAY HAVE CLEAR LIQUIDS MIDNIGHT UNTIL 730 AM DAY OF SURGERY, NO CLEAR LIQUIDS AFTER 730 AM DAY OF SURGERY.     Take these medicines the morning of surgery with A SIP OF WATER: PANTAPRAZOLE ([place]), OXYCODONE IF NEEDED                               You may not have any metal on your body including hair pins and              piercings  Do not wear jewelry, make-up, lotions, powders or perfumes, deodorant             Do not wear nail polish.  Do not shave  48 hours prior to surgery.              Men may shave face and neck.   Do not bring valuables to the hospital. [place].  Contacts, dentures or bridgework may not be worn into surgery.  Leave suitcase in the car. After surgery it may be brought to your room.     Patients discharged the day of surgery will not be allowed to drive home.  Name and phone number of your driver:  Special Instructions: N/A              Please read over the following fact sheets you were given: _____________________________________________________________________                CLEAR LIQUID DIET   Foods Allowed                                                                     Foods Excluded  Coffee and tea, regular and decaf                             liquids that you cannot  Plain Jell-O in any flavor                                             see through such as: Fruit ices (not with fruit pulp)                                     milk, soups, orange juice  Iced Popsicles  All solid food Carbonated beverages, regular and diet                                    Cranberry, grape and apple juices Sports drinks like Gatorade Lightly seasoned clear broth or consume(fat free) Sugar, honey syrup  Sample Menu Breakfast                                Lunch                                     Supper Cranberry juice                    Beef broth                            Chicken broth Jell-O                                     Grape juice                           Apple juice Coffee or tea                        Jell-O                                      Popsicle                                                Coffee or tea                        Coffee or tea  _____________________________________________________________________  [facility] - Preparing for Surgery Before surgery, you can play an important role.  Because skin is not sterile, your skin needs to be as free of germs as possible.  You can reduce the number of germs on your skin by washing with CHG (chlorahexidine gluconate) soap before surgery.  CHG is an antiseptic cleaner which kills germs and bonds with the skin to continue killing germs even after washing. Please DO NOT use if you have an allergy to CHG or antibacterial soaps.  If your skin becomes reddened/irritated stop using the CHG and inform your nurse when you arrive at Short Stay. Do not shave (including legs and underarms) for at least 48 hours prior to the first CHG shower.  You may shave your face/neck. Please follow these instructions carefully:  1.  Shower with CHG Soap the night before surgery and the  morning of Surgery.  2.  If you choose to wash your hair, wash your hair first as usual with your  normal  shampoo.  3.  After you shampoo, rinse your hair and body thoroughly to remove the  shampoo.  4.  Use CHG as you would any other liquid soap.  You can apply chg directly  to the skin and wash                        Gently with a scrungie or clean washcloth.  5.  Apply the CHG Soap to your body ONLY FROM THE NECK DOWN.   Do not use on face/ open                           Wound or open sores. Avoid contact with eyes, ears mouth and genitals (private parts).                       Wash face,  Genitals (private parts) with your normal soap.             6.  Wash thoroughly, paying special attention to the area where your surgery  will be performed.  7.  Thoroughly rinse your body with warm water from the neck down.  8.  DO NOT shower/wash with your normal soap after using and rinsing off  the CHG Soap.                9.  Pat yourself dry with a clean towel.            10.  Wear clean pajamas.            11.  Place clean sheets on your bed the night of your first shower and do not  sleep with pets. Day of Surgery : Do not apply any lotions/deodorants the morning of surgery.  Please wear clean clothes to the hospital/surgery center.  FAILURE TO FOLLOW THESE INSTRUCTIONS MAY RESULT IN THE CANCELLATION OF YOUR SURGERY PATIENT SIGNATURE_________________________________  NURSE SIGNATURE__________________________________  ________________________________________________________________________

## 2015-07-04 ENCOUNTER — Encounter: Payer: Self-pay | Admitting: Oncology

## 2015-07-04 ENCOUNTER — Encounter (HOSPITAL_COMMUNITY)
Admission: RE | Admit: 2015-07-04 | Discharge: 2015-07-04 | Disposition: A | Discharge status: Home / Self Care | Payer: PRIVATE HEALTH INSURANCE | Source: Ambulatory Visit | Attending: General Surgery | Admitting: General Surgery

## 2015-07-04 ENCOUNTER — Encounter (HOSPITAL_COMMUNITY): Payer: Self-pay

## 2015-07-04 DIAGNOSIS — Z79899 Other long term (current) drug therapy: Secondary | ICD-10-CM | POA: Diagnosis not present

## 2015-07-04 DIAGNOSIS — C20 Malignant neoplasm of rectum: Secondary | ICD-10-CM | POA: Diagnosis not present

## 2015-07-04 DIAGNOSIS — K219 Gastro-esophageal reflux disease without esophagitis: Secondary | ICD-10-CM | POA: Diagnosis not present

## 2015-07-04 DIAGNOSIS — Z9981 Dependence on supplemental oxygen: Secondary | ICD-10-CM | POA: Diagnosis not present

## 2015-07-04 DIAGNOSIS — Z87891 Personal history of nicotine dependence: Secondary | ICD-10-CM | POA: Diagnosis not present

## 2015-07-04 LAB — CBC
HCT: 31.6 % — ABNORMAL LOW (ref 39.0–52.0)
Hemoglobin: 9.8 g/dL — ABNORMAL LOW (ref 13.0–17.0)
MCH: 26.5 pg (ref 26.0–34.0)
MCHC: 31 g/dL (ref 30.0–36.0)
MCV: 85.4 fL (ref 78.0–100.0)
PLATELETS: 369 10*3/uL (ref 150–400)
RBC: 3.7 MIL/uL — AB (ref 4.22–5.81)
RDW: 15.7 % — AB (ref 11.5–15.5)
WBC: 6.8 10*3/uL (ref 4.0–10.5)

## 2015-07-04 NOTE — Progress Notes (Signed)
05/30/15-noted Chest CT in EPIC 06/26/15-noted in EPIC- CBC,PT,PTT 06/29/15-noted in EPIC-CEA.

## 2015-07-04 NOTE — Progress Notes (Signed)
I faxed fmla forms to [phone] and placed fitness form on desk for dr., sherrill to sign. Noted sharepoint.

## 2015-07-05 ENCOUNTER — Encounter: Payer: Self-pay | Admitting: Pharmacist

## 2015-07-06 ENCOUNTER — Ambulatory Visit (HOSPITAL_COMMUNITY): Payer: PRIVATE HEALTH INSURANCE | Admitting: Anesthesiology

## 2015-07-06 ENCOUNTER — Ambulatory Visit (HOSPITAL_COMMUNITY): Payer: PRIVATE HEALTH INSURANCE

## 2015-07-06 ENCOUNTER — Encounter (HOSPITAL_COMMUNITY): Payer: Self-pay | Admitting: *Deleted

## 2015-07-06 ENCOUNTER — Ambulatory Visit (HOSPITAL_COMMUNITY)
Admission: RE | Admit: 2015-07-06 | Discharge: 2015-07-06 | Disposition: A | Discharge status: Home / Self Care | Payer: PRIVATE HEALTH INSURANCE | Source: Ambulatory Visit | Attending: General Surgery | Admitting: General Surgery

## 2015-07-06 ENCOUNTER — Telehealth: Payer: Self-pay | Admitting: Oncology

## 2015-07-06 ENCOUNTER — Encounter: Payer: PRIVATE HEALTH INSURANCE | Admitting: Gastroenterology

## 2015-07-06 ENCOUNTER — Encounter (HOSPITAL_COMMUNITY)
Admission: RE | Disposition: A | Discharge status: Home / Self Care | Payer: Self-pay | Source: Ambulatory Visit | Attending: General Surgery

## 2015-07-06 DIAGNOSIS — Z95828 Presence of other vascular implants and grafts: Secondary | ICD-10-CM

## 2015-07-06 DIAGNOSIS — Z79899 Other long term (current) drug therapy: Secondary | ICD-10-CM | POA: Insufficient documentation

## 2015-07-06 DIAGNOSIS — Z87891 Personal history of nicotine dependence: Secondary | ICD-10-CM | POA: Insufficient documentation

## 2015-07-06 DIAGNOSIS — C20 Malignant neoplasm of rectum: Secondary | ICD-10-CM | POA: Insufficient documentation

## 2015-07-06 DIAGNOSIS — Z9981 Dependence on supplemental oxygen: Secondary | ICD-10-CM | POA: Insufficient documentation

## 2015-07-06 DIAGNOSIS — K219 Gastro-esophageal reflux disease without esophagitis: Secondary | ICD-10-CM | POA: Insufficient documentation

## 2015-07-06 HISTORY — PX: PORTACATH PLACEMENT: SHX2246

## 2015-07-06 SURGERY — INSERTION, TUNNELED CENTRAL VENOUS DEVICE, WITH PORT
Anesthesia: General | Site: Chest | Laterality: Right

## 2015-07-06 MED ORDER — HEPARIN SOD (PORK) LOCK FLUSH 100 UNIT/ML IV SOLN
INTRAVENOUS | Status: DC | PRN
  Administered 2015-07-06: 500 [IU] via INTRAVENOUS

## 2015-07-06 MED ORDER — DEXAMETHASONE SODIUM PHOSPHATE 10 MG/ML IJ SOLN
INTRAMUSCULAR | Status: DC | PRN
  Administered 2015-07-06: 10 mg via INTRAVENOUS

## 2015-07-06 MED ORDER — BUPIVACAINE-EPINEPHRINE (PF) 0.25% -1:200000 IJ SOLN
INTRAMUSCULAR | Status: AC
  Filled 2015-07-06: qty 30

## 2015-07-06 MED ORDER — FENTANYL CITRATE (PF) 100 MCG/2ML IJ SOLN
INTRAMUSCULAR | Status: DC | PRN
  Administered 2015-07-06 (×2): 50 ug via INTRAVENOUS

## 2015-07-06 MED ORDER — MEPERIDINE HCL 50 MG/ML IJ SOLN: 6.2500 mg | INTRAMUSCULAR | Status: DC | PRN

## 2015-07-06 MED ORDER — HYDROMORPHONE HCL 1 MG/ML IJ SOLN
INTRAMUSCULAR | Status: AC
  Administered 2015-07-06: 0.25 mg via INTRAVENOUS
  Filled 2015-07-06: qty 1

## 2015-07-06 MED ORDER — HYDROMORPHONE HCL 1 MG/ML IJ SOLN
INTRAMUSCULAR | Status: AC
  Filled 2015-07-06: qty 1

## 2015-07-06 MED ORDER — SODIUM CHLORIDE 0.9 % IV SOLN
Freq: Once | INTRAVENOUS | Status: AC
  Administered 2015-07-06: 500 mL
  Filled 2015-07-06: qty 1.2

## 2015-07-06 MED ORDER — BUPIVACAINE HCL 0.25 % IJ SOLN
INTRAMUSCULAR | Status: DC | PRN
  Administered 2015-07-06: 8 mL

## 2015-07-06 MED ORDER — LACTATED RINGERS IV SOLN: INTRAVENOUS | Status: DC

## 2015-07-06 MED ORDER — MIDAZOLAM HCL 2 MG/2ML IJ SOLN
INTRAMUSCULAR | Status: AC
  Filled 2015-07-06: qty 2

## 2015-07-06 MED ORDER — DEXAMETHASONE SODIUM PHOSPHATE 10 MG/ML IJ SOLN
INTRAMUSCULAR | Status: AC
  Filled 2015-07-06: qty 1

## 2015-07-06 MED ORDER — LACTATED RINGERS IV SOLN
INTRAVENOUS | Status: DC | PRN
  Administered 2015-07-06: 15:00:00 via INTRAVENOUS

## 2015-07-06 MED ORDER — HYDROMORPHONE HCL 1 MG/ML IJ SOLN
0.2500 mg | INTRAMUSCULAR | Status: DC | PRN
  Administered 2015-07-06: 0.5 mg via INTRAVENOUS
  Administered 2015-07-06: 0.25 mg via INTRAVENOUS
  Administered 2015-07-06: 0.5 mg via INTRAVENOUS
  Administered 2015-07-06: 0.25 mg via INTRAVENOUS

## 2015-07-06 MED ORDER — ONDANSETRON HCL 4 MG/2ML IJ SOLN
INTRAMUSCULAR | Status: AC
  Filled 2015-07-06: qty 2

## 2015-07-06 MED ORDER — PROPOFOL 10 MG/ML IV BOLUS
INTRAVENOUS | Status: AC
  Filled 2015-07-06: qty 20

## 2015-07-06 MED ORDER — CEFAZOLIN SODIUM-DEXTROSE 2-3 GM-% IV SOLR
2.0000 g | Freq: Once | INTRAVENOUS | Status: AC
  Administered 2015-07-06: 2 g via INTRAVENOUS

## 2015-07-06 MED ORDER — PROPOFOL 10 MG/ML IV BOLUS
INTRAVENOUS | Status: DC | PRN
  Administered 2015-07-06: 200 mg via INTRAVENOUS

## 2015-07-06 MED ORDER — OXYCODONE-ACETAMINOPHEN 5-325 MG PO TABS: ORAL_TABLET | ORAL | Status: DC

## 2015-07-06 MED ORDER — FENTANYL CITRATE (PF) 100 MCG/2ML IJ SOLN
INTRAMUSCULAR | Status: AC
  Filled 2015-07-06: qty 2

## 2015-07-06 MED ORDER — MIDAZOLAM HCL 5 MG/5ML IJ SOLN
INTRAMUSCULAR | Status: DC | PRN
  Administered 2015-07-06: 2 mg via INTRAVENOUS

## 2015-07-06 MED ORDER — LIDOCAINE HCL (CARDIAC) 20 MG/ML IV SOLN
INTRAVENOUS | Status: AC
  Filled 2015-07-06: qty 5

## 2015-07-06 MED ORDER — LIDOCAINE HCL (CARDIAC) 20 MG/ML IV SOLN
INTRAVENOUS | Status: DC | PRN
  Administered 2015-07-06: 100 mg via INTRAVENOUS

## 2015-07-06 MED ORDER — PHENYLEPHRINE HCL 10 MG/ML IJ SOLN
INTRAMUSCULAR | Status: DC | PRN
  Administered 2015-07-06 (×2): 80 ug via INTRAVENOUS

## 2015-07-06 MED ORDER — CEFAZOLIN SODIUM-DEXTROSE 2-3 GM-% IV SOLR
INTRAVENOUS | Status: AC
  Filled 2015-07-06: qty 50

## 2015-07-06 MED ORDER — OXYCODONE HCL 5 MG/5ML PO SOLN
5.0000 mg | Freq: Once | ORAL | Status: AC | PRN
  Filled 2015-07-06: qty 5

## 2015-07-06 MED ORDER — OXYCODONE HCL 5 MG PO TABS
5.0000 mg | ORAL_TABLET | Freq: Once | ORAL | Status: AC | PRN
Start: 2015-07-06 — End: 2015-07-06
  Administered 2015-07-06: 5 mg via ORAL
  Filled 2015-07-06: qty 1

## 2015-07-06 MED ORDER — ONDANSETRON HCL 4 MG/2ML IJ SOLN
INTRAMUSCULAR | Status: DC | PRN
  Administered 2015-07-06: 4 mg via INTRAVENOUS

## 2015-07-06 MED ORDER — PHENYLEPHRINE 40 MCG/ML (10ML) SYRINGE FOR IV PUSH (FOR BLOOD PRESSURE SUPPORT)
PREFILLED_SYRINGE | INTRAVENOUS | Status: AC
  Filled 2015-07-06: qty 10

## 2015-07-06 MED ORDER — HEPARIN SOD (PORK) LOCK FLUSH 100 UNIT/ML IV SOLN
INTRAVENOUS | Status: AC
  Filled 2015-07-06: qty 5

## 2015-07-06 SURGICAL SUPPLY — 31 items
BAG DECANTER FOR FLEXI CONT (MISCELLANEOUS) ×3 IMPLANT
BLADE SURG SZ11 CARB STEEL (BLADE) ×3 IMPLANT
CHLORAPREP W/TINT 26ML (MISCELLANEOUS) ×3 IMPLANT
COVER SURGICAL LIGHT HANDLE (MISCELLANEOUS) ×3 IMPLANT
DECANTER SPIKE VIAL GLASS SM (MISCELLANEOUS) ×3 IMPLANT
DRAPE C-ARM 42X120 X-RAY (DRAPES) ×3 IMPLANT
DRAPE LAPAROTOMY TRNSV 102X78 (DRAPE) ×3 IMPLANT
ELECT PENCIL ROCKER SW 15FT (MISCELLANEOUS) ×3 IMPLANT
ELECT REM PT RETURN 9FT ADLT (ELECTROSURGICAL) ×3
ELECTRODE REM PT RTRN 9FT ADLT (ELECTROSURGICAL) ×1 IMPLANT
GAUZE SPONGE 4X4 16PLY XRAY LF (GAUZE/BANDAGES/DRESSINGS) ×3 IMPLANT
GLOVE BIO SURGEON STRL SZ 6.5 (GLOVE) ×2 IMPLANT
GLOVE BIO SURGEONS STRL SZ 6.5 (GLOVE) ×1
GLOVE BIOGEL PI IND STRL 7.0 (GLOVE) ×1 IMPLANT
GLOVE BIOGEL PI INDICATOR 7.0 (GLOVE) ×2
GOWN STRL REUS W/TWL 2XL LVL3 (GOWN DISPOSABLE) ×3 IMPLANT
GOWN STRL REUS W/TWL XL LVL3 (GOWN DISPOSABLE) ×3 IMPLANT
KIT BASIN OR (CUSTOM PROCEDURE TRAY) ×3 IMPLANT
KIT PORT POWER 8FR ISP CVUE (Catheter) ×2 IMPLANT
LIQUID BAND (GAUZE/BANDAGES/DRESSINGS) ×3 IMPLANT
NDL HYPO 25X1 1.5 SAFETY (NEEDLE) ×1 IMPLANT
NEEDLE HYPO 25X1 1.5 SAFETY (NEEDLE) ×3 IMPLANT
PACK BASIC VI WITH GOWN DISP (CUSTOM PROCEDURE TRAY) ×3 IMPLANT
SUT PROLENE 2 0 SH DA (SUTURE) ×3 IMPLANT
SUT VIC AB 3-0 SH 27 (SUTURE) ×3
SUT VIC AB 3-0 SH 27XBRD (SUTURE) ×1 IMPLANT
SUT VIC AB 4-0 PS2 18 (SUTURE) ×3 IMPLANT
SYR CONTROL 10ML LL (SYRINGE) ×3 IMPLANT
SYRINGE 10CC LL (SYRINGE) ×3 IMPLANT
TOWEL OR 17X26 10 PK STRL BLUE (TOWEL DISPOSABLE) ×3 IMPLANT
TOWEL OR NON WOVEN STRL DISP B (DISPOSABLE) ×3 IMPLANT

## 2015-07-06 NOTE — Anesthesia Preprocedure Evaluation (Signed)
Anesthesia Evaluation  Patient identified by MRN, date of birth, ID band Patient awake    Reviewed: Allergy & Precautions, NPO status , Patient's Chart, lab work & pertinent test results  Airway Mallampati: I  TM Distance: >3 FB Neck ROM: Full    Dental  (+) Dental Advisory Given, Teeth Intact   Pulmonary former smoker,    breath sounds clear to auscultation       Cardiovascular  Rhythm:Regular Rate:Normal     Neuro/Psych    GI/Hepatic GERD  Medicated and Controlled,  Endo/Other    Renal/GU      Musculoskeletal   Abdominal   Peds  Hematology   Anesthesia Other Findings   Reproductive/Obstetrics                             Anesthesia Physical Anesthesia Plan  ASA: III  Anesthesia Plan: General   Post-op Pain Management:    Induction: Intravenous  Airway Management Planned: LMA  Additional Equipment:   Intra-op Plan:   Post-operative Plan: Extubation in OR  Informed Consent: I have reviewed the patients History and Physical, chart, labs and discussed the procedure including the risks, benefits and alternatives for the proposed anesthesia with the patient or authorized representative who has indicated his/her understanding and acceptance.   Dental advisory given  Plan Discussed with: CRNA, Anesthesiologist and Surgeon  Anesthesia Plan Comments:         Anesthesia Quick Evaluation

## 2015-07-06 NOTE — Op Note (Signed)
07/06/2015  4:46 PM  PATIENT:  Hunter Walker  65 y.o. male  Patient Care Team: [name], MD as PCP - General (Internal Medicine)  PRE-OPERATIVE DIAGNOSIS:  stage 4 rectal cancer  POST-OPERATIVE DIAGNOSIS:  stage 4 rectal cancer  PROCEDURE:  INSERTION PORT-A-CATH  SURGEON:  Surgeon(s): Leighton Ruff, MD  ANESTHESIA:   general  EBL:  Total I/O In: 600 [I.V.:600] Out: -   DISPOSITION OF SPECIMEN:  N/A  COUNTS:  YES  PLAN OF CARE: Discharge to home after PACU  PATIENT DISPOSITION:  PACU - hemodynamically stable.  INDICATION: Patient with need for IV chemotherapy. Port-A-Cath placement was requested.   Use of a central venous catheter for intravenous therapy was discussed. Technique of catheter placement using ultrasound and fluoroscopy guidance was discussed. Risks such as bleeding, infection, pneumothorax, catheter occlusion, reoperation, and other risks were discussed. I noted a good likelihood this will help address the problem. Questions were answered. The patient expressed understanding & wishes to proceed.   Findings: Normal-appearing anatomy.   8 [place] power port. It goes through the right internal jugular vein   Procedure: Informed consent was confirmed. Patient was brought the operating room and positioned supine. Arms were tucked. The patient underwent deep sedation. Neck and chest were clipped and prepped and draped in a sterile fashion. A surgical timeout confirmed our plan. I placed a field block of local anesthesia on the chest and neck.  I used an intraoperative [place] to identify the right internal jugular vein and the carotid artery was noted posterior to this.  I entered into the vein on the first venipuncture under direct visualization. Non-pulsatile blood was returned. Wire was easily passed into the inferior vena cava and position was confirmed by fluoroscopy. I confirmed placement of the wire in the right side of the chest.  I made an incision in  the lateral infraclavicular area and made a subcutaneous pocket. I used a dilator on the wire using Seldinger technique to dilate the tract under fluoroscopy. I placed the catheter into the sheath. I then peeled away the dilator sheath. I tunneled the power port from the puncture site to the chest pocket. I cut the catheter to appropriate length and attached it to the port using the plastic connector. The port was placed into the pocket and secured to the left anterior chest wall using 2-0 Prolene interrupted stitches x2. Catheter flushed well.  Fluoroscopy confirmed the tip in the distal SVC. Catheter aspirated and flushed well. On final fluoro reevaluation the tip seen to be in good position in the distal SVC.  I closed the wounds using 3-0 Vicryl interrupted sutures for the pocket and 4-0 Vicryl stitch was used to close the skin. Dermabond was used on the 2 incisions. Catheter was flushed with concentrated heparin.  CXR will be performed in PACU. Patient should go home later today. Catheter is okay to use.

## 2015-07-06 NOTE — Discharge Instructions (Addendum)
PORT-A-CATH: POST OP INSTRUCTIONS  Always review your discharge instruction sheet given to you by the facility where your surgery was performed.   1. A prescription for pain medication may be given to you upon discharge. Take your pain medication as prescribed, if needed. If narcotic pain medicine is not needed, then you make take acetaminophen (Tylenol) or ibuprofen (Advil) as needed.  2. Take your usually prescribed medications unless otherwise directed. 3. If you need a refill on your pain medication, please contact our office. All narcotic pain medicine now requires a paper prescription.  Phoned in and fax refills are no longer allowed by law.  Prescriptions will not be filled after 5 pm or on weekends.  4. You should follow a light diet for the remainder of the day after your procedure. 5. Most patients will experience some mild swelling and/or bruising in the area of the incision. It may take several days to resolve. 6. It is common to experience some constipation if taking pain medication after surgery. Increasing fluid intake and taking a stool softener (such as Colace) will usually help or prevent this problem from occurring. A mild laxative (Milk of Magnesia or Miralax) should be taken according to package directions if there are no bowel movements after 48 hours.  7. Unless discharge instructions indicate otherwise, you may remove your bandages 48 hours after surgery, and you may shower at that time. You may have steri-strips (small white skin tapes) in place directly over the incision.  These strips should be left on the skin for 7-10 days.  If your surgeon used Dermabond (skin glue) on the incision, you may shower in 24 hours.  The glue will flake off over the next 2-3 weeks.  8. If your port is left accessed at the end of surgery (needle left in port), the dressing cannot get wet and should only by changed by a healthcare professional. When the port is no longer accessed (when the needle has  been removed), follow step 7.   9. ACTIVITIES:  Limit activity involving your arms for the next 72 hours. Do no strenuous exercise or activity for 1 week. You may drive when you are no longer taking prescription pain medication, you can comfortably wear a seatbelt, and you can maneuver your car. 10.You may need to see your doctor in the office for a follow-up appointment.  Please       check with your doctor.  11.When you receive a new Port-a-Cath, you will get a product guide and        ID card.  Please keep them in case you need them.  WHEN TO CALL YOUR DOCTOR [phone]): 1. Fever over 101.0 2. Chills 3. Continued bleeding from incision 4. Increased redness and tenderness at the site 5. Shortness of breath, difficulty breathing   The clinic staff is available to answer your questions during regular business hours. Please dont hesitate to call and ask to speak to one of the nurses or medical assistants for clinical concerns. If you have a medical emergency, go to the nearest emergency room or call 911.  A surgeon from [facility] is always on call at the hospital.     For further information, please visit [email or web address]     General Anesthesia, Adult, Care After Refer to this sheet in the next few weeks. These instructions provide you with information on caring for yourself after your procedure. Your health care provider may also give you more specific instructions. Your treatment has  been planned according to current medical practices, but problems sometimes occur. Call your health care provider if you have any problems or questions after your procedure. WHAT TO EXPECT AFTER THE PROCEDURE After the procedure, it is typical to experience:  Sleepiness.  Nausea and vomiting. HOME CARE INSTRUCTIONS  For the first 24 hours after general anesthesia:  Have a responsible person with you.  Do not drive a car. If you are alone, do not take public  transportation.  Do not drink alcohol.  Do not take medicine that has not been prescribed by your health care provider.  Do not sign important papers or make important decisions.  You may resume a normal diet and activities as directed by your health care provider.  Change bandages (dressings) as directed.  If you have questions or problems that seem related to general anesthesia, call the hospital and ask for the anesthetist or anesthesiologist on call. SEEK MEDICAL CARE IF:  You have nausea and vomiting that continue the day after anesthesia.  You develop a rash. SEEK IMMEDIATE MEDICAL CARE IF:   You have difficulty breathing.  You have chest pain.  You have any allergic problems.   This information is not intended to replace advice given to you by your health care provider. Make sure you discuss any questions you have with your health care provider.   Document Released: 08/26/2000 Document Revised: 06/10/2014 Document Reviewed: 09/18/2011 Elsevier Interactive Patient Education [employer].

## 2015-07-06 NOTE — Telephone Encounter (Signed)
FAXED RECORDS REQUEST TO [facility]. [phone] RELEASE ID)

## 2015-07-06 NOTE — Anesthesia Postprocedure Evaluation (Signed)
Anesthesia Post Note  Patient: Hunter Walker  Procedure(s) Performed: Procedure(s) (LRB): INSERTION PORT-A-CATH (Right)  Patient location during evaluation: PACU Anesthesia Type: General Level of consciousness: awake and alert Pain management: pain level controlled Vital Signs Assessment: post-procedure vital signs reviewed and stable Respiratory status: spontaneous breathing, nonlabored ventilation and respiratory function stable Cardiovascular status: blood pressure returned to baseline and stable Postop Assessment: no signs of nausea or vomiting Anesthetic complications: no    Last Vitals:  Filed Vitals:   07/06/15 1654 07/06/15 1700  BP: 123/85 134/82  Pulse: 72 67  Temp: 36.3 C   Resp: 17 11    Last Pain:  Filed Vitals:   07/06/15 1713  PainSc: 6                  Sameera Betton A

## 2015-07-06 NOTE — H&P (Signed)
Patient presents to me with concerning for metastatic rectal cancer. CT biopsy confirmed this. He is scheduled to start chemotherapy next week. We are here today to place a port.  Past Medical History  Diagnosis Date  . Allergic rhinitis   . GERD (gastroesophageal reflux disease)   . Hemorrhoids   . Cancer [facility])     rectal cancer   Past Surgical History  Procedure Laterality Date  . No past surgeries    . Eus N/A 06/01/2015    Procedure: LOWER ENDOSCOPIC ULTRASOUND (EUS);  Surgeon: Milus Banister, MD;  Location: [facility];  Service: Endoscopy;  Laterality: N/A;  . Liver biopsy  06/26/2015     Medication List           bisacodyl 5 MG EC tablet  Commonly known as:  DULCOLAX  Take 5 mg by mouth daily as needed (constipation).     lidocaine-prilocaine cream  Commonly known as:  EMLA  Apply 1 application topically as needed.     oxyCODONE-acetaminophen 5-325 MG tablet  Commonly known as:  PERCOCET/ROXICET  1 to 2 tabs PO q6hrs  PRN for pain     pantoprazole 40 MG tablet  Commonly known as:  PROTONIX  Take 1 tablet (40 mg total) by mouth daily.       No Known Allergies  Family History  Problem Relation Age of Onset  . Breast cancer Sister     x 2  . Diabetes Mother   . Kidney disease Mother   . Prostate cancer Father   . Diabetes Father   . Prostate cancer Brother   . Diabetes Sister   . Heart disease Brother   . Colon cancer Neg Hx    Social History   Social History  . Marital Status: Married    Spouse Name: N/A  . Number of Children: 2  . Years of Education: N/A   Occupational History  . teacher    Social History Main Topics  . Smoking status: Former Smoker -- 1.00 packs/day for 10 years    Types: Cigarettes    Quit date: 06/03/1978  . Smokeless tobacco: Never Used  . Alcohol Use: 0.0 oz/week    0 Standard drinks or equivalent per week     Comment: occ  . Drug Use: No  . Sexual Activity: Not on file   Other Topics Concern  . Not on file    Social History Narrative   Married, wife Sharyn Lull   #2 daughters: Mendel Ryder and Waynard Reeds      Review of Systems - General ROS: negative for - chills or fever Respiratory ROS: no cough, shortness of breath, or wheezing Cardiovascular ROS: no chest pain or dyspnea on exertion Gastrointestinal ROS: no abdominal pain, change in bowel habits, or black or bloody stools Genito-Urinary ROS: no dysuria, trouble voiding, or hematuria   Physical Exam  Constitutional: He is oriented to person, place, and time and well-developed, well-nourished, and in no distress.  HENT:  Head: Normocephalic and atraumatic.  Eyes: Conjunctivae are normal. Pupils are equal, round, and reactive to light.  Neck: Normal range of motion.  Cardiovascular: Normal rate and regular rhythm.   Pulmonary/Chest: Effort normal and breath sounds normal. No respiratory distress.  Abdominal: Soft. Bowel sounds are normal. He exhibits no distension. There is no tenderness.  Musculoskeletal: Normal range of motion.  Neurological: He is alert and oriented to person, place, and time.  Skin: Skin is warm and dry.    Assessment Stage 4  rectal cancer  Plan OR today.  Risks of bleeding, infection, device malfunction and pneumothorax were specifically dicussed with the patient.  Other risks of anesthesia were explained.  Consent was signed and placed on the chart.

## 2015-07-06 NOTE — Transfer of Care (Signed)
Immediate Anesthesia Transfer of Care Note  Patient: Hunter Walker  Procedure(s) Performed: Procedure(s): INSERTION PORT-A-CATH (Right)  Patient Location: PACU  Anesthesia Type:General  Level of Consciousness: awake, alert  and oriented  Airway & Oxygen Therapy: Patient Spontanous Breathing and Patient connected to face mask oxygen  Post-op Assessment: Report given to RN and Post -op Vital signs reviewed and stable  Post vital signs: Reviewed and stable  Last Vitals:  Filed Vitals:   07/06/15 1336  BP: 130/84  Pulse: 80  Temp: 36.8 C  Resp: 16    Complications: No apparent anesthesia complications

## 2015-07-07 ENCOUNTER — Encounter (HOSPITAL_COMMUNITY): Payer: Self-pay | Admitting: General Surgery

## 2015-07-07 ENCOUNTER — Encounter: Payer: Self-pay | Admitting: Oncology

## 2015-07-07 NOTE — Progress Notes (Signed)
I faxed forms [phone] and left copy for patient records at front with ms wilma to pick up on 07/10/15

## 2015-07-10 ENCOUNTER — Telehealth: Payer: Self-pay | Admitting: Nurse Practitioner

## 2015-07-10 ENCOUNTER — Other Ambulatory Visit: Payer: Self-pay | Admitting: Oncology

## 2015-07-10 ENCOUNTER — Ambulatory Visit (HOSPITAL_BASED_OUTPATIENT_CLINIC_OR_DEPARTMENT_OTHER): Payer: PRIVATE HEALTH INSURANCE | Admitting: Nurse Practitioner

## 2015-07-10 ENCOUNTER — Other Ambulatory Visit (HOSPITAL_BASED_OUTPATIENT_CLINIC_OR_DEPARTMENT_OTHER): Payer: PRIVATE HEALTH INSURANCE

## 2015-07-10 ENCOUNTER — Encounter: Payer: Self-pay | Admitting: Oncology

## 2015-07-10 ENCOUNTER — Ambulatory Visit (HOSPITAL_BASED_OUTPATIENT_CLINIC_OR_DEPARTMENT_OTHER): Payer: PRIVATE HEALTH INSURANCE

## 2015-07-10 ENCOUNTER — Encounter: Payer: Self-pay | Admitting: *Deleted

## 2015-07-10 ENCOUNTER — Telehealth: Payer: Self-pay | Admitting: *Deleted

## 2015-07-10 VITALS — BP 143/80 | HR 73 | Temp 97.9°F | Resp 18 | Ht 73.0 in | Wt 178.8 lb

## 2015-07-10 DIAGNOSIS — Z5111 Encounter for antineoplastic chemotherapy: Secondary | ICD-10-CM

## 2015-07-10 DIAGNOSIS — C20 Malignant neoplasm of rectum: Secondary | ICD-10-CM | POA: Diagnosis not present

## 2015-07-10 LAB — COMPREHENSIVE METABOLIC PANEL
ALT: 9 U/L (ref 0–55)
ANION GAP: 9 meq/L (ref 3–11)
AST: 16 U/L (ref 5–34)
Albumin: 3.3 g/dL — ABNORMAL LOW (ref 3.5–5.0)
Alkaline Phosphatase: 65 U/L (ref 40–150)
BUN: 10 mg/dL (ref 7.0–26.0)
CHLORIDE: 106 meq/L (ref 98–109)
CO2: 25 meq/L (ref 22–29)
CREATININE: 1.2 mg/dL (ref 0.7–1.3)
Calcium: 9.1 mg/dL (ref 8.4–10.4)
EGFR: 77 mL/min/{1.73_m2} — ABNORMAL LOW (ref >90)
Glucose: 125 mg/dl (ref 70–140)
POTASSIUM: 4.1 meq/L (ref 3.5–5.1)
Sodium: 141 mEq/L (ref 136–145)
Total Bilirubin: <0.3 mg/dL (ref 0.20–1.20)
Total Protein: 7.4 g/dL (ref 6.4–8.3)

## 2015-07-10 LAB — CBC WITH DIFFERENTIAL/PLATELET
BASO%: 0.9 % (ref 0.0–2.0)
Basophils Absolute: 0.1 10*3/uL (ref 0.0–0.1)
EOS%: 2.6 % (ref 0.0–7.0)
Eosinophils Absolute: 0.2 10*3/uL (ref 0.0–0.5)
HCT: 29.8 % — ABNORMAL LOW (ref 38.4–49.9)
HGB: 9.3 g/dL — ABNORMAL LOW (ref 13.0–17.1)
LYMPH%: 29.6 % (ref 14.0–49.0)
MCH: 25.6 pg — AB (ref 27.2–33.4)
MCHC: 31.2 g/dL — AB (ref 32.0–36.0)
MCV: 82.1 fL (ref 79.3–98.0)
MONO#: 0.5 10*3/uL (ref 0.1–0.9)
MONO%: 6.8 % (ref 0.0–14.0)
NEUT#: 4.4 10*3/uL (ref 1.5–6.5)
NEUT%: 60.1 % (ref 39.0–75.0)
Platelets: 358 10*3/uL (ref 140–400)
RBC: 3.63 10*6/uL — AB (ref 4.20–5.82)
RDW: 16.4 % — ABNORMAL HIGH (ref 11.0–14.6)
WBC: 7.3 10*3/uL (ref 4.0–10.3)
lymph#: 2.2 10*3/uL (ref 0.9–3.3)

## 2015-07-10 LAB — UA PROTEIN, DIPSTICK - CHCC: Protein, ur: NEGATIVE mg/dL

## 2015-07-10 MED ORDER — PROCHLORPERAZINE MALEATE 10 MG PO TABS: 10.0000 mg | ORAL_TABLET | Freq: Four times a day (QID) | ORAL | Status: DC | PRN

## 2015-07-10 MED ORDER — DEXTROSE 5 % IV SOLN
Freq: Once | INTRAVENOUS | Status: AC
  Administered 2015-07-10: 15:00:00 via INTRAVENOUS

## 2015-07-10 MED ORDER — LEUCOVORIN CALCIUM INJECTION 100 MG
20.0000 mg/m2 | Freq: Once | INTRAMUSCULAR | Status: AC
  Administered 2015-07-10: 40 mg via INTRAVENOUS
  Filled 2015-07-10: qty 2

## 2015-07-10 MED ORDER — FLUOROURACIL CHEMO INJECTION 2.5 GM/50ML
400.0000 mg/m2 | Freq: Once | INTRAVENOUS | Status: AC
  Administered 2015-07-10: 800 mg via INTRAVENOUS
  Filled 2015-07-10: qty 16

## 2015-07-10 MED ORDER — OXALIPLATIN CHEMO INJECTION 100 MG/20ML
85.0000 mg/m2 | Freq: Once | INTRAVENOUS | Status: AC
  Administered 2015-07-10: 175 mg via INTRAVENOUS
  Filled 2015-07-10: qty 35

## 2015-07-10 MED ORDER — SODIUM CHLORIDE 0.9 % IV SOLN
10.0000 mg | Freq: Once | INTRAVENOUS | Status: AC
  Administered 2015-07-10: 10 mg via INTRAVENOUS
  Filled 2015-07-10: qty 1

## 2015-07-10 MED ORDER — SODIUM CHLORIDE 0.9 % IV SOLN
2475.0000 mg/m2 | INTRAVENOUS | Status: DC
  Administered 2015-07-10: 5000 mg via INTRAVENOUS
  Filled 2015-07-10: qty 100

## 2015-07-10 MED ORDER — PALONOSETRON HCL INJECTION 0.25 MG/5ML
INTRAVENOUS | Status: AC
Start: 2015-07-10 — End: 2015-07-10
  Filled 2015-07-10: qty 5

## 2015-07-10 MED ORDER — PALONOSETRON HCL INJECTION 0.25 MG/5ML
0.2500 mg | Freq: Once | INTRAVENOUS | Status: AC
Start: 2015-07-10 — End: 2015-07-10
  Administered 2015-07-10: 0.25 mg via INTRAVENOUS

## 2015-07-10 NOTE — Telephone Encounter (Signed)
Per staff message and POF I have scheduled appts. Advised scheduler of appts. [name]  

## 2015-07-10 NOTE — Progress Notes (Signed)
Introduced myself as his FA.  Informed him of the [place] card.  Applied in his behalf and received approval for Avastin from 07/10/15 to 07/08/16 for $25,000.  Emailed copy of approval letter to Raquel Sarna in the [employer].  Also informed him of the [place].  Pt will provide proof of income on 07/12/15 to see if he qualifies.  Pt has my card for any questions or concerns he may have in the future.

## 2015-07-10 NOTE — Patient Instructions (Signed)
[facility] Discharge Instructions for Patients Receiving Chemothera Today you received the following chemotherapy agents as  listed  To help prevent nausea and vomiting after your treatment, we encourage you to take your nausea medication as directed   If you develop nausea and vomiting that is not controlled by your nausea medication, call the clinic.   BELOW ARE SYMPTOMS THAT SHOULD BE REPORTED IMMEDIATELY:  *FEVER GREATER THAN 100.5 F  *CHILLS WITH OR WITHOUT FEVER  NAUSEA AND VOMITING THAT IS NOT CONTROLLED WITH YOUR NAUSEA MEDICATION  *UNUSUAL SHORTNESS OF BREATH  *UNUSUAL BRUISING OR BLEEDING  TENDERNESS IN MOUTH AND THROAT WITH OR WITHOUT PRESENCE OF ULCERS  *URINARY PROBLEMS  *BOWEL PROBLEMS  UNUSUAL RASH Items with * indicate a potential emergency and should be followed up as soon as possible.  Feel free to call the clinic you have any questions or concerns. The clinic phone number is [phone].  Please show the [place] at check-in to the Emergency Department and triage nurse.  Fluorouracil, 5-FU injection What is this medicine? FLUOROURACIL, 5-FU (flure oh YOOR a sil) is a chemotherapy drug. It slows the growth of cancer cells. This medicine is used to treat many types of cancer like breast cancer, colon or rectal cancer, pancreatic cancer, and stomach cancer. This medicine may be used for other purposes; ask your health care provider or pharmacist if you have questions. What should I tell my health care provider before I take this medicine? They need to know if you have any of these conditions: -blood disorders -dihydropyrimidine dehydrogenase (DPD) deficiency -infection (especially a virus infection such as chickenpox, cold sores, or herpes) -kidney disease -liver disease -malnourished, poor nutrition -recent or ongoing radiation therapy -an unusual or allergic reaction to fluorouracil, other chemotherapy, other medicines, foods,  dyes, or preservatives -pregnant or trying to get pregnant -breast-feeding How should I use this medicine? This drug is given as an infusion or injection into a vein. It is administered in a hospital or clinic by a specially trained health care professional. Talk to your pediatrician regarding the use of this medicine in children. Special care may be needed. Overdosage: If you think you have taken too much of this medicine contact a poison control center or emergency room at once. NOTE: This medicine is only for you. Do not share this medicine with others. What if I miss a dose? It is important not to miss your dose. Call your doctor or health care professional if you are unable to keep an appointment. What may interact with this medicine? -allopurinol -cimetidine -dapsone -digoxin -hydroxyurea -leucovorin -levamisole -medicines for seizures like ethotoin, fosphenytoin, phenytoin -medicines to increase blood counts like filgrastim, pegfilgrastim, sargramostim -medicines that treat or prevent blood clots like warfarin, enoxaparin, and dalteparin -methotrexate -metronidazole -pyrimethamine -some other chemotherapy drugs like busulfan, cisplatin, estramustine, vinblastine -trimethoprim -trimetrexate -vaccines Talk to your doctor or health care professional before taking any of these medicines: -acetaminophen -aspirin -ibuprofen -ketoprofen -naproxen This list may not describe all possible interactions. Give your health care provider a list of all the medicines, herbs, non-prescription drugs, or dietary supplements you use. Also tell them if you smoke, drink alcohol, or use illegal drugs. Some items may interact with your medicine. What should I watch for while using this medicine? Visit your doctor for checks on your progress. This drug may make you feel generally unwell. This is not uncommon, as chemotherapy can affect healthy cells as well as cancer cells. Report any side effects.  Continue your course of treatment even though you feel ill unless your doctor tells you to stop. In some cases, you may be given additional medicines to help with side effects. Follow all directions for their use. Call your doctor or health care professional for advice if you get a fever, chills or sore throat, or other symptoms of a cold or flu. Do not treat yourself. This drug decreases your body's ability to fight infections. Try to avoid being around people who are sick. This medicine may increase your risk to bruise or bleed. Call your doctor or health care professional if you notice any unusual bleeding. Be careful brushing and flossing your teeth or using a toothpick because you may get an infection or bleed more easily. If you have any dental work done, tell your dentist you are receiving this medicine. Avoid taking products that contain aspirin, acetaminophen, ibuprofen, naproxen, or ketoprofen unless instructed by your doctor. These medicines may hide a fever. Do not become pregnant while taking this medicine. Women should inform their doctor if they wish to become pregnant or think they might be pregnant. There is a potential for serious side effects to an unborn child. Talk to your health care professional or pharmacist for more information. Do not breast-feed an infant while taking this medicine. Men should inform their doctor if they wish to father a child. This medicine may lower sperm counts. Do not treat diarrhea with over the counter products. Contact your doctor if you have diarrhea that lasts more than 2 days or if it is severe and watery. This medicine can make you more sensitive to the sun. Keep out of the sun. If you cannot avoid being in the sun, wear protective clothing and use sunscreen. Do not use sun lamps or tanning beds/booths. What side effects may I notice from receiving this medicine? Side effects that you should report to your doctor or health care professional as soon as  possible: -allergic reactions like skin rash, itching or hives, swelling of the face, lips, or tongue -low blood counts - this medicine may decrease the number of white blood cells, red blood cells and platelets. You may be at increased risk for infections and bleeding. -signs of infection - fever or chills, cough, sore throat, pain or difficulty passing urine -signs of decreased platelets or bleeding - bruising, pinpoint red spots on the skin, black, tarry stools, blood in the urine -signs of decreased red blood cells - unusually weak or tired, fainting spells, lightheadedness -breathing problems -changes in vision -chest pain -mouth sores -nausea and vomiting -pain, swelling, redness at site where injected -pain, tingling, numbness in the hands or feet -redness, swelling, or sores on hands or feet -stomach pain -unusual bleeding Side effects that usually do not require medical attention (report to your doctor or health care professional if they continue or are bothersome): -changes in finger or toe nails -diarrhea -dry or itchy skin -hair loss -headache -loss of appetite -sensitivity of eyes to the light -stomach upset -unusually teary eyes This list may not describe all possible side effects. Call your doctor for medical advice about side effects. You may report side effects to FDA at 1-800-FDA-1088. Where should I keep my medicine? This drug is given in a hospital or clinic and will not be stored at home. NOTE: This sheet is a summary. It may not cover all possible information. If you have questions about this medicine, talk to your doctor, pharmacist, or health care provider.  2016, Elsevier/Gold Standard. (2007-09-23 13:53:16) Leucovorin injection What is this medicine? LEUCOVORIN (loo koe VOR in) is used to prevent or treat the harmful effects of some medicines. This medicine is used to treat anemia caused by a low amount of folic acid in the body. It is also used with  5-fluorouracil (5-FU) to treat colon cancer. This medicine may be used for other purposes; ask your health care provider or pharmacist if you have questions. What should I tell my health care provider before I take this medicine? They need to know if you have any of these conditions: -anemia from low levels of vitamin B-12 in the blood -an unusual or allergic reaction to leucovorin, folic acid, other medicines, foods, dyes, or preservatives -pregnant or trying to get pregnant -breast-feeding How should I use this medicine? This medicine is for injection into a muscle or into a vein. It is given by a health care professional in a hospital or clinic setting. Talk to your pediatrician regarding the use of this medicine in children. Special care may be needed. Overdosage: If you think you have taken too much of this medicine contact a poison control center or emergency room at once. NOTE: This medicine is only for you. Do not share this medicine with others. What if I miss a dose? This does not apply. What may interact with this medicine? -capecitabine -fluorouracil -phenobarbital -phenytoin -primidone -trimethoprim-sulfamethoxazole This list may not describe all possible interactions. Give your health care provider a list of all the medicines, herbs, non-prescription drugs, or dietary supplements you use. Also tell them if you smoke, drink alcohol, or use illegal drugs. Some items may interact with your medicine. What should I watch for while using this medicine? Your condition will be monitored carefully while you are receiving this medicine. This medicine may increase the side effects of 5-fluorouracil, 5-FU. Tell your doctor or health care professional if you have diarrhea or mouth sores that do not get better or that get worse. What side effects may I notice from receiving this medicine? Side effects that you should report to your doctor or health care professional as soon as  possible: -allergic reactions like skin rash, itching or hives, swelling of the face, lips, or tongue -breathing problems -fever, infection -mouth sores -unusual bleeding or bruising -unusually weak or tired Side effects that usually do not require medical attention (report to your doctor or health care professional if they continue or are bothersome): -constipation or diarrhea -loss of appetite -nausea, vomiting This list may not describe all possible side effects. Call your doctor for medical advice about side effects. You may report side effects to FDA at 1-800-FDA-1088. Where should I keep my medicine? This drug is given in a hospital or clinic and will not be stored at home. NOTE: This sheet is a summary. It may not cover all possible information. If you have questions about this medicine, talk to your doctor, pharmacist, or health care provider.    2016, Elsevier/Gold Standard. (2007-11-24 16:50:29) Oxaliplatin Injection What is this medicine? OXALIPLATIN (ox AL i PLA tin) is a chemotherapy drug. It targets fast dividing cells, like cancer cells, and causes these cells to die. This medicine is used to treat cancers of the colon and rectum, and many other cancers. This medicine may be used for other purposes; ask your health care provider or pharmacist if you have questions. What should I tell my health care provider before I take this medicine? They need to know if you have any  of these conditions: -kidney disease -an unusual or allergic reaction to oxaliplatin, other chemotherapy, other medicines, foods, dyes, or preservatives -pregnant or trying to get pregnant -breast-feeding How should I use this medicine? This drug is given as an infusion into a vein. It is administered in a hospital or clinic by a specially trained health care professional. Talk to your pediatrician regarding the use of this medicine in children. Special care may be needed. Overdosage: If you think you have  taken too much of this medicine contact a poison control center or emergency room at once. NOTE: This medicine is only for you. Do not share this medicine with others. What if I miss a dose? It is important not to miss a dose. Call your doctor or health care professional if you are unable to keep an appointment. What may interact with this medicine? -medicines to increase blood counts like filgrastim, pegfilgrastim, sargramostim -probenecid -some antibiotics like amikacin, gentamicin, neomycin, polymyxin B, streptomycin, tobramycin -zalcitabine Talk to your doctor or health care professional before taking any of these medicines: -acetaminophen -aspirin -ibuprofen -ketoprofen -naproxen This list may not describe all possible interactions. Give your health care provider a list of all the medicines, herbs, non-prescription drugs, or dietary supplements you use. Also tell them if you smoke, drink alcohol, or use illegal drugs. Some items may interact with your medicine. What should I watch for while using this medicine? Your condition will be monitored carefully while you are receiving this medicine. You will need important blood work done while you are taking this medicine. This medicine can make you more sensitive to cold. Do not drink cold drinks or use ice. Cover exposed skin before coming in contact with cold temperatures or cold objects. When out in cold weather wear warm clothing and cover your mouth and nose to warm the air that goes into your lungs. Tell your doctor if you get sensitive to the cold. This drug may make you feel generally unwell. This is not uncommon, as chemotherapy can affect healthy cells as well as cancer cells. Report any side effects. Continue your course of treatment even though you feel ill unless your doctor tells you to stop. In some cases, you may be given additional medicines to help with side effects. Follow all directions for their use. Call your doctor or  health care professional for advice if you get a fever, chills or sore throat, or other symptoms of a cold or flu. Do not treat yourself. This drug decreases your body's ability to fight infections. Try to avoid being around people who are sick. This medicine may increase your risk to bruise or bleed. Call your doctor or health care professional if you notice any unusual bleeding. Be careful brushing and flossing your teeth or using a toothpick because you may get an infection or bleed more easily. If you have any dental work done, tell your dentist you are receiving this medicine. Avoid taking products that contain aspirin, acetaminophen, ibuprofen, naproxen, or ketoprofen unless instructed by your doctor. These medicines may hide a fever. Do not become pregnant while taking this medicine. Women should inform their doctor if they wish to become pregnant or think they might be pregnant. There is a potential for serious side effects to an unborn child. Talk to your health care professional or pharmacist for more information. Do not breast-feed an infant while taking this medicine. Call your doctor or health care professional if you get diarrhea. Do not treat yourself. What side effects may  I notice from receiving this medicine? Side effects that you should report to your doctor or health care professional as soon as possible: -allergic reactions like skin rash, itching or hives, swelling of the face, lips, or tongue -low blood counts - This drug may decrease the number of white blood cells, red blood cells and platelets. You may be at increased risk for infections and bleeding. -signs of infection - fever or chills, cough, sore throat, pain or difficulty passing urine -signs of decreased platelets or bleeding - bruising, pinpoint red spots on the skin, black, tarry stools, nosebleeds -signs of decreased red blood cells - unusually weak or tired, fainting spells, lightheadedness -breathing  problems -chest pain, pressure -cough -diarrhea -jaw tightness -mouth sores -nausea and vomiting -pain, swelling, redness or irritation at the injection site -pain, tingling, numbness in the hands or feet -problems with balance, talking, walking -redness, blistering, peeling or loosening of the skin, including inside the mouth -trouble passing urine or change in the amount of urine Side effects that usually do not require medical attention (report to your doctor or health care professional if they continue or are bothersome): -changes in vision -constipation -hair loss -loss of appetite -metallic taste in the mouth or changes in taste -stomach pain This list may not describe all possible side effects. Call your doctor for medical advice about side effects. You may report side effects to FDA at 1-800-FDA-1088. Where should I keep my medicine? This drug is given in a hospital or clinic and will not be stored at home. NOTE: This sheet is a summary. It may not cover all possible information. If you have questions about this medicine, talk to your doctor, pharmacist, or health care provider.    2016, Elsevier/Gold Standard. (2007-12-15 17:22:47)

## 2015-07-10 NOTE — Telephone Encounter (Signed)
Pt confirmed labs/ov per 02/06 POF, gave pt AVS and Calendar... KJ, sent msg to add chemo °

## 2015-07-10 NOTE — Progress Notes (Signed)
  [facility] OFFICE PROGRESS NOTE   Diagnosis:   Rectal cancer  INTERVAL HISTORY:    Mr. Lounsberry returns as scheduled. He continues to have rectal pain, bleeding and frequency/urgency. He takes Percocet as needed. He denies nausea. Appetite is stable.  Objective:  Vital signs in last 24 hours:  Blood pressure 143/80, pulse 73, temperature 97.9 F (36.6 C), temperature source Oral, resp. rate 18, height 6\' 1"  (1.854 m), weight 178 lb 12.8 oz (81.103 kg), SpO2 100 %.    HEENT: No thrush or ulcers. Resp: Lungs clear bilaterally. Cardio: Regular rate and rhythm. GI: Abdomen soft and nontender. No hepatomegaly. Vascular: No leg edema.  Port-A-Cath without erythema.    Lab Results:  Lab Results  Component Value Date   WBC 7.3 07/10/2015   HGB 9.3* 07/10/2015   HCT 29.8* 07/10/2015   MCV 82.1 07/10/2015   PLT 358 07/10/2015   NEUTROABS 4.4 07/10/2015    Imaging:  No results found.  Medications: I have reviewed the patient's current medications.  Assessment/Plan: 1. Rectal cancer, invasive adenocarcinoma confirmed at colonoscopy 05/22/2015, EUS staging 06/01/2015 revealed a uT3,N1 lesion at 7 cm from the anal verge  Staging CT abdomen/pelvis 05/15/2015 confirmed a mass at the rectosigmoid colon, a suspicious perirectal lymph node, and a 6 mm right liver lesion felt to most likely be a cyst  CT chest 05/30/2015 with indeterminate pulmonary nodules and multiple liver lesions suspicious for metastases  Ultrasound 06/13/2014 with no liver lesions identified  MRI abdomen 06/21/2015 consistent with multiple liver metastases  CT biopsy of liver lesion 06/26/2015. Pathology showed metastatic adenocarcinoma consistent with colonic primary.  Cycle 1 FOLFOX 07/10/2015   2. Multiple colon polyps on the colonoscopy 05/22/2015, tubular villous adenoma and a tubular adenoma were removed 3. Anemia-likely secondary to rectal bleeding 4. Family history of  multiple cancers including breast and prostate cancer 5. Transient right arm numbness 06/20/2015 6. Port-A-Cath placement 07/06/2015 by Dr. Marcello Moores   Disposition: Hunter Walker appears stable. Plan to proceed with cycle 1 FOLFOX today as scheduled. Avastin will be added with cycle 2.  He will return for a follow-up visit and cycle 2 FOLFOX plus Avastin on 07/24/2015. He will contact the office in the interim with any problems.  A prescription was sent to his pharmacy for Compazine 10 mg every 6 hours as needed.    Ned Card ANP/GNP-BC   07/10/2015  11:27 AM

## 2015-07-10 NOTE — Progress Notes (Signed)
Revised forms placed at front with ms. Wilma for patient copy/records and faxed revised [phone]

## 2015-07-10 NOTE — Progress Notes (Signed)
Oncology Nurse Navigator Documentation  Oncology Nurse Navigator Flowsheets 07/10/2015  Navigator Location [facility]  Navigator Encounter Type Treatment  Abnormal Finding Date -  Confirmed Diagnosis Date -  Treatment Initiated Date 07/10/2015  Patient Visit Type MedOnc  Treatment Phase First Chemo Tx--FOLFOX   Barriers/Navigation Needs Financial--revised disability papers  Interventions Other-message to managed care to inquire if his revised papers had been sent in.  Acuity Level 1  Time Spent with Patient 15

## 2015-07-11 ENCOUNTER — Encounter (HOSPITAL_COMMUNITY): Payer: Self-pay

## 2015-07-11 LAB — CEA: CEA1: 8 ng/mL — AB (ref 0.0–4.7)

## 2015-07-12 ENCOUNTER — Encounter: Payer: Self-pay | Admitting: Oncology

## 2015-07-12 ENCOUNTER — Ambulatory Visit (HOSPITAL_BASED_OUTPATIENT_CLINIC_OR_DEPARTMENT_OTHER): Payer: PRIVATE HEALTH INSURANCE

## 2015-07-12 VITALS — BP 126/77 | HR 67 | Temp 98.1°F | Resp 18

## 2015-07-12 DIAGNOSIS — C20 Malignant neoplasm of rectum: Secondary | ICD-10-CM

## 2015-07-12 MED ORDER — SODIUM CHLORIDE 0.9% FLUSH
10.0000 mL | INTRAVENOUS | Status: DC | PRN
  Administered 2015-07-12: 10 mL
  Filled 2015-07-12: qty 10

## 2015-07-12 MED ORDER — HEPARIN SOD (PORK) LOCK FLUSH 100 UNIT/ML IV SOLN
500.0000 [IU] | Freq: Once | INTRAVENOUS | Status: AC | PRN
  Administered 2015-07-12: 500 [IU]
  Filled 2015-07-12: qty 5

## 2015-07-12 NOTE — Patient Instructions (Signed)
[facility] Discharge Instructions for Patients Receiving Chemotherapy  Today you received the following chemotherapy agents fluorouracil. To help prevent nausea and vomiting after your treatment, we encourage you to take your nausea medication as directed.  If you develop nausea and vomiting that is not controlled by your nausea medication, call the clinic.   BELOW ARE SYMPTOMS THAT SHOULD BE REPORTED IMMEDIATELY:  *FEVER GREATER THAN 100.5 F  *CHILLS WITH OR WITHOUT FEVER  NAUSEA AND VOMITING THAT IS NOT CONTROLLED WITH YOUR NAUSEA MEDICATION  *UNUSUAL SHORTNESS OF BREATH  *UNUSUAL BRUISING OR BLEEDING  TENDERNESS IN MOUTH AND THROAT WITH OR WITHOUT PRESENCE OF ULCERS  *URINARY PROBLEMS  *BOWEL PROBLEMS  UNUSUAL RASH Items with * indicate a potential emergency and should be followed up as soon as possible.  Feel free to call the clinic you have any questions or concerns. The clinic phone number is [phone].  Please show the [place] at check-in to the Emergency Department and triage nurse.

## 2015-07-12 NOTE — Progress Notes (Signed)
Pt is approved for the $400 [facility] grant.  °

## 2015-07-14 ENCOUNTER — Other Ambulatory Visit: Payer: Self-pay | Admitting: *Deleted

## 2015-07-17 ENCOUNTER — Telehealth: Payer: Self-pay

## 2015-07-17 ENCOUNTER — Telehealth: Payer: Self-pay | Admitting: *Deleted

## 2015-07-17 DIAGNOSIS — C20 Malignant neoplasm of rectum: Secondary | ICD-10-CM

## 2015-07-17 MED ORDER — OXYCODONE-ACETAMINOPHEN 5-325 MG PO TABS: ORAL_TABLET | ORAL | Status: DC

## 2015-07-17 MED ORDER — PROCHLORPERAZINE MALEATE 10 MG PO TABS: 10.0000 mg | ORAL_TABLET | Freq: Four times a day (QID) | ORAL | Status: DC | PRN

## 2015-07-17 MED ORDER — BISACODYL 5 MG PO TBEC: 5.0000 mg | DELAYED_RELEASE_TABLET | Freq: Every day | ORAL | Status: DC | PRN

## 2015-07-17 NOTE — Telephone Encounter (Signed)
FMLA received via fax from Medical Records completed and requiring MD signature. Placed on MD's desk for signature  To be faxed back to [phone]

## 2015-07-17 NOTE — Telephone Encounter (Signed)
Printed and signed oxycodone.

## 2015-07-17 NOTE — Telephone Encounter (Signed)
Received call pt states he is needing refills on his compazine, ducolax, and oxycodone. Sent two maintenance pls advise on Oxycodone...Johny Chess

## 2015-07-17 NOTE — Telephone Encounter (Signed)
Notified pt rx ready for pick-up.../lmb 

## 2015-07-18 NOTE — Telephone Encounter (Signed)
Elmyra Ricks from Medical Records called to be sure you received fax and she would like you to call her. Her number is [phone]

## 2015-07-18 NOTE — Telephone Encounter (Signed)
Paperwork filled out and completed. Patient aware and will come pick up copy. Paperwork has been faxed as well.

## 2015-07-23 ENCOUNTER — Other Ambulatory Visit: Payer: Self-pay | Admitting: Oncology

## 2015-07-24 ENCOUNTER — Telehealth: Payer: Self-pay | Admitting: *Deleted

## 2015-07-24 ENCOUNTER — Ambulatory Visit (HOSPITAL_BASED_OUTPATIENT_CLINIC_OR_DEPARTMENT_OTHER): Payer: PRIVATE HEALTH INSURANCE

## 2015-07-24 ENCOUNTER — Other Ambulatory Visit: Payer: Self-pay | Admitting: Nurse Practitioner

## 2015-07-24 ENCOUNTER — Other Ambulatory Visit (HOSPITAL_BASED_OUTPATIENT_CLINIC_OR_DEPARTMENT_OTHER): Payer: PRIVATE HEALTH INSURANCE

## 2015-07-24 ENCOUNTER — Ambulatory Visit (HOSPITAL_BASED_OUTPATIENT_CLINIC_OR_DEPARTMENT_OTHER): Payer: PRIVATE HEALTH INSURANCE | Admitting: Nurse Practitioner

## 2015-07-24 ENCOUNTER — Telehealth: Payer: Self-pay | Admitting: Oncology

## 2015-07-24 ENCOUNTER — Encounter: Payer: Self-pay | Admitting: Oncology

## 2015-07-24 VITALS — BP 142/76 | HR 71 | Temp 98.4°F | Resp 18 | Ht 73.0 in | Wt 179.7 lb

## 2015-07-24 DIAGNOSIS — Z5111 Encounter for antineoplastic chemotherapy: Secondary | ICD-10-CM

## 2015-07-24 DIAGNOSIS — D701 Agranulocytosis secondary to cancer chemotherapy: Secondary | ICD-10-CM

## 2015-07-24 DIAGNOSIS — Z95828 Presence of other vascular implants and grafts: Secondary | ICD-10-CM

## 2015-07-24 DIAGNOSIS — C20 Malignant neoplasm of rectum: Secondary | ICD-10-CM

## 2015-07-24 DIAGNOSIS — Z5112 Encounter for antineoplastic immunotherapy: Secondary | ICD-10-CM

## 2015-07-24 DIAGNOSIS — D649 Anemia, unspecified: Secondary | ICD-10-CM | POA: Diagnosis not present

## 2015-07-24 DIAGNOSIS — C787 Secondary malignant neoplasm of liver and intrahepatic bile duct: Secondary | ICD-10-CM

## 2015-07-24 DIAGNOSIS — Z452 Encounter for adjustment and management of vascular access device: Secondary | ICD-10-CM | POA: Diagnosis not present

## 2015-07-24 LAB — CBC WITH DIFFERENTIAL/PLATELET
BASO%: 1.6 % (ref 0.0–2.0)
Basophils Absolute: 0.1 10*3/uL (ref 0.0–0.1)
EOS%: 4.7 % (ref 0.0–7.0)
Eosinophils Absolute: 0.2 10*3/uL (ref 0.0–0.5)
HCT: 25.4 % — ABNORMAL LOW (ref 38.4–49.9)
HGB: 7.9 g/dL — ABNORMAL LOW (ref 13.0–17.1)
LYMPH%: 51.3 % — AB (ref 14.0–49.0)
MCH: 25 pg — ABNORMAL LOW (ref 27.2–33.4)
MCHC: 31 g/dL — ABNORMAL LOW (ref 32.0–36.0)
MCV: 80.6 fL (ref 79.3–98.0)
MONO#: 0.4 10*3/uL (ref 0.1–0.9)
MONO%: 11.6 % (ref 0.0–14.0)
NEUT%: 30.8 % — ABNORMAL LOW (ref 39.0–75.0)
NEUTROS ABS: 1.1 10*3/uL — AB (ref 1.5–6.5)
Platelets: 244 10*3/uL (ref 140–400)
RBC: 3.16 10*6/uL — AB (ref 4.20–5.82)
RDW: 16.6 % — ABNORMAL HIGH (ref 11.0–14.6)
WBC: 3.6 10*3/uL — AB (ref 4.0–10.3)
lymph#: 1.8 10*3/uL (ref 0.9–3.3)

## 2015-07-24 LAB — COMPREHENSIVE METABOLIC PANEL
AST: 16 U/L (ref 5–34)
Albumin: 3.4 g/dL — ABNORMAL LOW (ref 3.5–5.0)
Alkaline Phosphatase: 61 U/L (ref 40–150)
Anion Gap: 6 mEq/L (ref 3–11)
BUN: 10 mg/dL (ref 7.0–26.0)
CO2: 26 meq/L (ref 22–29)
CREATININE: 1 mg/dL (ref 0.7–1.3)
Calcium: 8.7 mg/dL (ref 8.4–10.4)
Chloride: 106 mEq/L (ref 98–109)
EGFR: 88 mL/min/{1.73_m2} — ABNORMAL LOW (ref >90)
GLUCOSE: 105 mg/dL (ref 70–140)
Potassium: 4.1 mEq/L (ref 3.5–5.1)
SODIUM: 138 meq/L (ref 136–145)
TOTAL PROTEIN: 7.2 g/dL (ref 6.4–8.3)

## 2015-07-24 LAB — UA PROTEIN, DIPSTICK - CHCC: Protein, ur: NEGATIVE mg/dL

## 2015-07-24 MED ORDER — SODIUM CHLORIDE 0.9 % IV SOLN
Freq: Once | INTRAVENOUS | Status: AC
  Administered 2015-07-24: 12:00:00 via INTRAVENOUS

## 2015-07-24 MED ORDER — SODIUM CHLORIDE 0.9 % IV SOLN
5.0000 mg/kg | Freq: Once | INTRAVENOUS | Status: AC
  Administered 2015-07-24: 400 mg via INTRAVENOUS
  Filled 2015-07-24: qty 16

## 2015-07-24 MED ORDER — PALONOSETRON HCL INJECTION 0.25 MG/5ML
0.2500 mg | Freq: Once | INTRAVENOUS | Status: AC
  Administered 2015-07-24: 0.25 mg via INTRAVENOUS

## 2015-07-24 MED ORDER — FLUOROURACIL CHEMO INJECTION 2.5 GM/50ML
400.0000 mg/m2 | Freq: Once | INTRAVENOUS | Status: AC
  Administered 2015-07-24: 800 mg via INTRAVENOUS
  Filled 2015-07-24: qty 16

## 2015-07-24 MED ORDER — SODIUM CHLORIDE 0.9% FLUSH
10.0000 mL | INTRAVENOUS | Status: DC | PRN
  Administered 2015-07-24: 10 mL via INTRAVENOUS
  Filled 2015-07-24: qty 10

## 2015-07-24 MED ORDER — DEXTROSE 5 % IV SOLN
Freq: Once | INTRAVENOUS | Status: AC
  Administered 2015-07-24: 12:00:00 via INTRAVENOUS

## 2015-07-24 MED ORDER — DEXTROSE 5 % IV SOLN
85.0000 mg/m2 | Freq: Once | INTRAVENOUS | Status: AC
  Administered 2015-07-24: 175 mg via INTRAVENOUS
  Filled 2015-07-24: qty 35

## 2015-07-24 MED ORDER — SODIUM CHLORIDE 0.9 % IV SOLN
10.0000 mg | Freq: Once | INTRAVENOUS | Status: AC
  Administered 2015-07-24: 10 mg via INTRAVENOUS
  Filled 2015-07-24: qty 1

## 2015-07-24 MED ORDER — SODIUM CHLORIDE 0.9 % IV SOLN
2460.0000 mg/m2 | INTRAVENOUS | Status: DC
  Administered 2015-07-24: 5000 mg via INTRAVENOUS
  Filled 2015-07-24: qty 100

## 2015-07-24 MED ORDER — DEXTROSE 5 % IV SOLN
394.0000 mg/m2 | Freq: Once | INTRAVENOUS | Status: AC
  Administered 2015-07-24: 800 mg via INTRAVENOUS
  Filled 2015-07-24: qty 40

## 2015-07-24 MED ORDER — PALONOSETRON HCL INJECTION 0.25 MG/5ML
INTRAVENOUS | Status: AC
  Filled 2015-07-24: qty 5

## 2015-07-24 NOTE — Progress Notes (Signed)
  [facility] OFFICE PROGRESS NOTE   Diagnosis: Rectal cancer   INTERVAL HISTORY:   Hunter Walker returns as scheduled. He completed cycle 1 FOLFOX 07/10/2015. He denies nausea/vomiting. No mouth sores. He does note some tenderness of the gums with chewing. No diarrhea. He resumed cold after about 6 days with no problem. Rectal pain is better. He continues to have rectal bleeding.  Objective:  Vital signs in last 24 hours:  Blood pressure 142/76, pulse 71, temperature 98.4 F (36.9 C), temperature source Oral, resp. rate 18, height 6\' 1"  (1.854 m), weight 179 lb 11.2 oz (81.511 kg), SpO2 100 %.    HEENT: No thrush or ulcers. No gingival erythema. Resp: Lungs clear bilaterally. Cardio: Regular rate and rhythm. GI: Abdomen soft and nontender. No hepatomegaly. Vascular: No leg edema. Port-A-Cath without erythema.    Lab Results:  Lab Results  Component Value Date   WBC 3.6* 07/24/2015   HGB 7.9* 07/24/2015   HCT 25.4* 07/24/2015   MCV 80.6 07/24/2015   PLT 244 07/24/2015   NEUTROABS 1.1* 07/24/2015    Imaging:  No results found.  Medications: I have reviewed the patient's current medications.  Assessment/Plan: 1. Rectal cancer, invasive adenocarcinoma confirmed at colonoscopy 05/22/2015, EUS staging 06/01/2015 revealed a uT3,N1 lesion at 7 cm from the anal verge  Staging CT abdomen/pelvis 05/15/2015 confirmed a mass at the rectosigmoid colon, a suspicious perirectal lymph node, and a 6 mm right liver lesion felt to most likely be a cyst  CT chest 05/30/2015 with indeterminate pulmonary nodules and multiple liver lesions suspicious for metastases  Ultrasound 06/13/2014 with no liver lesions identified  MRI abdomen 06/21/2015 consistent with multiple liver metastases  CT biopsy of liver lesion 06/26/2015. Pathology showed metastatic adenocarcinoma consistent with colonic primary.  Cycle 1 FOLFOX 07/10/2015  Cycle 2 FOLFOX plus Avastin 07/24/2015 with  Neulasta support 2. Multiple colon polyps on the colonoscopy 05/22/2015, tubular villous adenoma and a tubular adenoma were removed 3. Anemia-likely secondary to rectal bleeding.  4. Family history of multiple cancers including breast and prostate cancer 5. Transient right arm numbness 06/20/2015 6. Port-A-Cath placement 07/06/2015 by Dr. Marcello Moores 7. Mild neutropenia secondary to chemotherapy following cycle 1 FOLFOX. Neulasta added with cycle 2 on day of pump discontinuation.    Disposition: Hunter Walker appears stable. He has completed 1 cycle of FOLFOX. Overall he seems of tolerated cycle 1 well. Plan to proceed with cycle 2 today as scheduled plus Avastin. He has mild neutropenia on labs today. He will receive Neulasta on the day of pump discontinuation. We reviewed potential toxicities associated with Neulasta including bone pain, rash, splenic rupture. He is agreeable to proceed. He understands to contact the office with fever, chills, other signs of infection.  He has progressive anemia, declining MCV. Likely iron deficiency related to the rectal bleeding. He will begin ferrous sulfate 325 mg 3 times daily. We discussed the potential for constipation. He is currently on a stool softener. He understands he can increase this to twice a day and also begin Miralax if needed.  He will return for a follow-up visit and cycle 3 FOLFOX/Avastin in 2 weeks. He will contact the office in the interim as outlined above or with any other problems.  Plan reviewed with Dr. Benay Spice.      Ned Card ANP/GNP-BC   07/24/2015  10:03 AM

## 2015-07-24 NOTE — Progress Notes (Signed)
Faxed [facility] application today.  °

## 2015-07-24 NOTE — Progress Notes (Signed)
Reviewed labs. OK to treat per Ned Card, NP with ANC 1.1 and HGB of 7.9

## 2015-07-24 NOTE — Telephone Encounter (Signed)
Per staff message and POF I have scheduled appts. Advised scheduler of appts. [name]  

## 2015-07-24 NOTE — Telephone Encounter (Signed)
Pt confirmed appt and received avs °

## 2015-07-24 NOTE — Patient Instructions (Signed)
[facility] Discharge Instructions for Patients Receiving Chemotherapy  Today you received the following chemotherapy agents: Avastin, Oxaliplatin, Leucovorin, 5FU.  To help prevent nausea and vomiting after your treatment, we encourage you to take your nausea medication: Compazine 10 mg every 6 hours as needed.   If you develop nausea and vomiting that is not controlled by your nausea medication, call the clinic.   BELOW ARE SYMPTOMS THAT SHOULD BE REPORTED IMMEDIATELY:  *FEVER GREATER THAN 100.5 F  *CHILLS WITH OR WITHOUT FEVER  NAUSEA AND VOMITING THAT IS NOT CONTROLLED WITH YOUR NAUSEA MEDICATION  *UNUSUAL SHORTNESS OF BREATH  *UNUSUAL BRUISING OR BLEEDING  TENDERNESS IN MOUTH AND THROAT WITH OR WITHOUT PRESENCE OF ULCERS  *URINARY PROBLEMS  *BOWEL PROBLEMS  UNUSUAL RASH Items with * indicate a potential emergency and should be followed up as soon as possible.  Feel free to call the clinic you have any questions or concerns. The clinic phone number is [phone].  Please show the [facility] at check-in to the Emergency Department and triage nurse.

## 2015-07-24 NOTE — Patient Instructions (Signed)
Begin Ferrous sulfate 325 mg three times a day

## 2015-07-26 ENCOUNTER — Ambulatory Visit: Payer: PRIVATE HEALTH INSURANCE

## 2015-07-26 ENCOUNTER — Ambulatory Visit (HOSPITAL_BASED_OUTPATIENT_CLINIC_OR_DEPARTMENT_OTHER): Payer: PRIVATE HEALTH INSURANCE

## 2015-07-26 DIAGNOSIS — Z5189 Encounter for other specified aftercare: Secondary | ICD-10-CM | POA: Diagnosis not present

## 2015-07-26 DIAGNOSIS — C20 Malignant neoplasm of rectum: Secondary | ICD-10-CM

## 2015-07-26 MED ORDER — HEPARIN SOD (PORK) LOCK FLUSH 100 UNIT/ML IV SOLN
500.0000 [IU] | Freq: Once | INTRAVENOUS | Status: AC
  Administered 2015-07-26: 500 [IU] via INTRAVENOUS
  Filled 2015-07-26: qty 5

## 2015-07-26 MED ORDER — PEGFILGRASTIM INJECTION 6 MG/0.6ML ~~LOC~~
6.0000 mg | PREFILLED_SYRINGE | Freq: Once | SUBCUTANEOUS | Status: AC
  Administered 2015-07-26: 6 mg via SUBCUTANEOUS
  Filled 2015-07-26: qty 0.6

## 2015-07-26 MED ORDER — SODIUM CHLORIDE 0.9% FLUSH
3.0000 mL | INTRAVENOUS | Status: DC | PRN
  Filled 2015-07-26: qty 10

## 2015-07-26 MED ORDER — SODIUM CHLORIDE 0.9% FLUSH
10.0000 mL | INTRAVENOUS | Status: DC | PRN
  Filled 2015-07-26: qty 10

## 2015-07-26 MED ORDER — SODIUM CHLORIDE 0.9% FLUSH
10.0000 mL | INTRAVENOUS | Status: DC | PRN
  Administered 2015-07-26: 10 mL via INTRAVENOUS
  Filled 2015-07-26: qty 10

## 2015-07-26 MED ORDER — COLD PACK MISC ONCOLOGY
1.0000 | Freq: Once | Status: DC | PRN
  Filled 2015-07-26: qty 1

## 2015-07-26 MED ORDER — HEPARIN SOD (PORK) LOCK FLUSH 100 UNIT/ML IV SOLN
250.0000 [IU] | Freq: Once | INTRAVENOUS | Status: DC | PRN
  Filled 2015-07-26: qty 5

## 2015-07-26 MED ORDER — HEPARIN SOD (PORK) LOCK FLUSH 100 UNIT/ML IV SOLN
500.0000 [IU] | Freq: Once | INTRAVENOUS | Status: DC | PRN
  Filled 2015-07-26: qty 5

## 2015-07-26 MED ORDER — ALTEPLASE 2 MG IJ SOLR
2.0000 mg | Freq: Once | INTRAMUSCULAR | Status: DC | PRN
  Filled 2015-07-26: qty 2

## 2015-07-26 NOTE — Progress Notes (Signed)
Neulasta injection given by the flush nurse after home infusion pump disconnected

## 2015-07-26 NOTE — Patient Instructions (Signed)

## 2015-07-27 ENCOUNTER — Telehealth: Payer: Self-pay | Admitting: *Deleted

## 2015-07-27 NOTE — Telephone Encounter (Signed)
Call placed to [facility] with [employer] to notify her that paperwork was received and was given to Darlena in managed care to complete.

## 2015-07-27 NOTE — Telephone Encounter (Signed)
Hunter Walker with [name] is faxing paperwork and needs confirmation that you received it. Her phone number is [phone], or her email is on the paperwork.

## 2015-07-30 ENCOUNTER — Encounter (HOSPITAL_COMMUNITY): Payer: Self-pay | Admitting: Emergency Medicine

## 2015-07-30 ENCOUNTER — Emergency Department (HOSPITAL_COMMUNITY)
Admission: EM | Admit: 2015-07-30 | Discharge: 2015-07-30 | Disposition: A | Discharge status: Home / Self Care | Payer: PRIVATE HEALTH INSURANCE | Attending: Emergency Medicine | Admitting: Emergency Medicine

## 2015-07-30 DIAGNOSIS — Z79899 Other long term (current) drug therapy: Secondary | ICD-10-CM | POA: Insufficient documentation

## 2015-07-30 DIAGNOSIS — Z85048 Personal history of other malignant neoplasm of rectum, rectosigmoid junction, and anus: Secondary | ICD-10-CM | POA: Insufficient documentation

## 2015-07-30 DIAGNOSIS — R112 Nausea with vomiting, unspecified: Secondary | ICD-10-CM | POA: Insufficient documentation

## 2015-07-30 DIAGNOSIS — E860 Dehydration: Secondary | ICD-10-CM | POA: Diagnosis not present

## 2015-07-30 DIAGNOSIS — K219 Gastro-esophageal reflux disease without esophagitis: Secondary | ICD-10-CM | POA: Diagnosis not present

## 2015-07-30 DIAGNOSIS — Z87891 Personal history of nicotine dependence: Secondary | ICD-10-CM | POA: Insufficient documentation

## 2015-07-30 DIAGNOSIS — R531 Weakness: Secondary | ICD-10-CM | POA: Diagnosis present

## 2015-07-30 LAB — CBC
HEMATOCRIT: 28 % — AB (ref 39.0–52.0)
HEMOGLOBIN: 8.6 g/dL — AB (ref 13.0–17.0)
MCH: 24.9 pg — AB (ref 26.0–34.0)
MCHC: 30.7 g/dL (ref 30.0–36.0)
MCV: 81.2 fL (ref 78.0–100.0)
Platelets: 246 10*3/uL (ref 150–400)
RBC: 3.45 MIL/uL — AB (ref 4.22–5.81)
RDW: 16.2 % — ABNORMAL HIGH (ref 11.5–15.5)
WBC: 13.4 10*3/uL — ABNORMAL HIGH (ref 4.0–10.5)

## 2015-07-30 LAB — URINALYSIS, ROUTINE W REFLEX MICROSCOPIC
Bilirubin Urine: NEGATIVE
GLUCOSE, UA: NEGATIVE mg/dL
HGB URINE DIPSTICK: NEGATIVE
Ketones, ur: NEGATIVE mg/dL
Leukocytes, UA: NEGATIVE
Nitrite: NEGATIVE
PH: 5.5 (ref 5.0–8.0)
Protein, ur: NEGATIVE mg/dL
SPECIFIC GRAVITY, URINE: 1.02 (ref 1.005–1.030)

## 2015-07-30 LAB — BASIC METABOLIC PANEL
Anion gap: 9 (ref 5–15)
BUN: 12 mg/dL (ref 6–20)
CHLORIDE: 109 mmol/L (ref 101–111)
CO2: 26 mmol/L (ref 22–32)
CREATININE: 1.07 mg/dL (ref 0.61–1.24)
Calcium: 9 mg/dL (ref 8.9–10.3)
GFR calc non Af Amer: >60 mL/min (ref >60)
Glucose, Bld: 120 mg/dL — ABNORMAL HIGH (ref 65–99)
POTASSIUM: 4.2 mmol/L (ref 3.5–5.1)
Sodium: 144 mmol/L (ref 135–145)

## 2015-07-30 LAB — CBG MONITORING, ED: Glucose-Capillary: 106 mg/dL — ABNORMAL HIGH (ref 65–99)

## 2015-07-30 LAB — TSH: TSH: 0.807 u[IU]/mL (ref 0.350–4.500)

## 2015-07-30 MED ORDER — LACTATED RINGERS IV BOLUS (SEPSIS)
2000.0000 mL | Freq: Once | INTRAVENOUS | Status: AC
  Administered 2015-07-30: 2000 mL via INTRAVENOUS

## 2015-07-30 NOTE — ED Notes (Signed)
Patient presents for weakness, last chemo 2/20. No increased N/V/D. Unable to tolerate normal amount of PO fluids, decreased BM. Denies fever/chills.

## 2015-07-30 NOTE — ED Provider Notes (Signed)
CSN: [id number]     Arrival date & time 07/30/15  1821 History   First MD Initiated Contact with Patient 07/30/15 1926     No chief complaint on file.    (Consider location/radiation/quality/duration/timing/severity/associated sxs/prior Treatment) Patient is a 65 y.o. male presenting with weakness.  Weakness This is a new problem. The current episode started more than 2 days ago. The problem occurs constantly. The problem has been gradually worsening. Pertinent negatives include no chest pain, no abdominal pain, no headaches and no shortness of breath. Nothing aggravates the symptoms. Nothing relieves the symptoms. He has tried nothing for the symptoms. The treatment provided no relief.    Past Medical History  Diagnosis Date  . Allergic rhinitis   . GERD (gastroesophageal reflux disease)   . Hemorrhoids   . Cancer [facility])     rectal cancer   Past Surgical History  Procedure Laterality Date  . No past surgeries    . Eus N/A 06/01/2015    Procedure: LOWER ENDOSCOPIC ULTRASOUND (EUS);  Surgeon: Milus Banister, MD;  Location: [facility];  Service: Endoscopy;  Laterality: N/A;  . Liver biopsy  06/26/2015  . Portacath placement Right 07/06/2015    Procedure: INSERTION PORT-A-CATH;  Surgeon: Leighton Ruff, MD;  Location: [facility];  Service: General;  Laterality: Right;   Family History  Problem Relation Age of Onset  . Breast cancer Sister     x 2  . Diabetes Mother   . Kidney disease Mother   . Prostate cancer Father   . Diabetes Father   . Prostate cancer Brother   . Diabetes Sister   . Heart disease Brother   . Colon cancer Neg Hx    Social History  Substance Use Topics  . Smoking status: Former Smoker -- 1.00 packs/day for 10 years    Types: Cigarettes    Quit date: 06/03/1978  . Smokeless tobacco: Never Used  . Alcohol Use: 0.0 oz/week    0 Standard drinks or equivalent per week     Comment: occ    Review of Systems  Constitutional: Positive for fatigue. Negative  for fever and chills.  HENT: Negative for congestion and drooling.   Eyes: Negative for pain.  Respiratory: Negative for apnea, cough and shortness of breath.   Cardiovascular: Negative for chest pain.  Gastrointestinal: Positive for nausea and vomiting. Negative for abdominal pain.  Endocrine: Negative for polydipsia and polyuria.  Genitourinary: Negative for dysuria and flank pain.  Musculoskeletal: Negative for back pain.  Skin: Negative for pallor, rash and wound.  Neurological: Positive for weakness. Negative for headaches.  All other systems reviewed and are negative.     Allergies  Review of patient's allergies indicates no known allergies.  Home Medications   Prior to Admission medications   Medication Sig Start Date End Date Taking? Authorizing Provider  bisacodyl (DULCOLAX) 5 MG EC tablet Take 1 tablet (5 mg total) by mouth daily as needed (constipation). 07/17/15  Yes [name], MD  oxyCODONE-acetaminophen (PERCOCET/ROXICET) 5-325 MG tablet 1 to 2 tabs PO q6hrs  PRN for pain Patient taking differently: Take 1 tablet by mouth every 6 (six) hours as needed for moderate pain or severe pain.  07/17/15  Yes [name], MD  pantoprazole (PROTONIX) 40 MG tablet Take 1 tablet (40 mg total) by mouth daily. 06/29/15  Yes [name], MD  prochlorperazine (COMPAZINE) 10 MG tablet Take 1 tablet (10 mg total) by mouth every 6 (six) hours as needed for  nausea or vomiting. 07/17/15  Yes [name], MD   BP 146/69 mmHg  Pulse 76  Temp(Src) 98.5 F (36.9 C) (Oral)  Resp 18  SpO2 99% Physical Exam  Constitutional: He is oriented to person, place, and time. He appears well-developed and well-nourished.  HENT:  Head: Normocephalic and atraumatic.  Mouth/Throat: Mucous membranes are dry.  Neck: Normal range of motion.  Cardiovascular: Normal rate.   Pulmonary/Chest: Effort normal. No respiratory distress.  Abdominal: Soft. He exhibits no  distension. There is no tenderness.  Musculoskeletal: Normal range of motion. He exhibits no edema or tenderness.  Neurological: He is alert and oriented to person, place, and time.  Skin: Skin is warm and dry. No rash noted. No erythema.  Nursing note and vitals reviewed.   ED Course  Procedures (including critical care time) Labs Review Labs Reviewed  BASIC METABOLIC PANEL - Abnormal; Notable for the following:    Glucose, Bld 120 (*)    All other components within normal limits  CBC - Abnormal; Notable for the following:    WBC 13.4 (*)    RBC 3.45 (*)    Hemoglobin 8.6 (*)    HCT 28.0 (*)    MCH 24.9 (*)    RDW 16.2 (*)    All other components within normal limits  CBG MONITORING, ED - Abnormal; Notable for the following:    Glucose-Capillary 106 (*)    All other components within normal limits  URINALYSIS, ROUTINE W REFLEX MICROSCOPIC (NOT AT [facility])  TSH    Imaging Review No results found. I have personally reviewed and evaluated these images and lab results as part of my medical decision-making.   EKG Interpretation   Date/Time:  Sunday July 30 2015 18:33:28 EST Ventricular Rate:  91 PR Interval:  144 QRS Duration: 108 QT Interval:  374 QTC Calculation: 460 R Axis:   65 Text Interpretation:  Sinus rhythm Low voltage, extremity leads Abnormal  R-wave progression, early transition Baseline wander in lead(s) V1 V2 V6  Confirmed by [facility] MD, Corene Cornea [phone]) on 07/30/2015 7:25:22 PM Also  confirmed by [facility] MD, TRUE Garciamartinez [phone]), editor [place]  CCT, [place]  ([id number])  on 07/31/2015 7:18:27 AM      MDM   Final diagnoses:  Dehydration   Weakness and one episode of vomiting after chemo 6 days ago. Decreased PO and UOP since that time.  Likely mild dehydration. Will fluid hydrate and reeval.  After a liter of fluids patient feels symptoms are 100% improved. No infection or other cause found, likely related to hyovolemia. Ambulating without difficulty, stable VS.      Merrily Pew, MD 07/31/15 1101

## 2015-07-31 ENCOUNTER — Telehealth: Payer: Self-pay | Admitting: *Deleted

## 2015-07-31 NOTE — Telephone Encounter (Signed)
Received fax from [facility] reporting pt called over the weekend with difficulty urinating. Fax also stated pt was able to void after initial call. Left message on voicemail requesting pt call office if he has any additional issues.

## 2015-08-03 ENCOUNTER — Telehealth: Payer: Self-pay

## 2015-08-03 ENCOUNTER — Telehealth: Payer: Self-pay | Admitting: *Deleted

## 2015-08-03 ENCOUNTER — Other Ambulatory Visit: Payer: Self-pay | Admitting: Nurse Practitioner

## 2015-08-03 MED ORDER — MAGIC MOUTHWASH: 5.0000 mL | Freq: Three times a day (TID) | ORAL | Status: DC | PRN

## 2015-08-03 NOTE — Telephone Encounter (Addendum)
Returned call to pt, he reports feeling better after IV hydration in ED. Had no further issues urinating. Has noticed muscle aches/ bone pain since Neulasta injection. Asks if he will need Neulasta with every treatment. Informed him if counts are adequate MD may hold Neulasta and resume if counts are low/ borderline. Pt reports cramping in both calf muscles today. Denies swelling or pain with ambulation.  Wants to make MD aware he vomited on 3 different days. Compazine minimally effective. He is able to eat and drink today, will follow up as scheduled 3/6.

## 2015-08-03 NOTE — Telephone Encounter (Signed)
Return call to Pt. Apologized for the delay, magic mouthwash called in to [facility] today.

## 2015-08-03 NOTE — Telephone Encounter (Signed)
Patient call stating that he was in the ED over the weekend and continues not feeling well.  He would like a return call from Dr. Gearldine Shown nurse as soon as possible.

## 2015-08-03 NOTE — Telephone Encounter (Signed)
"  I saw Ned Card NP July 24, 2015.  Told her my gums are sore and tender.  A mouthwash was to be called in to [facility] at [place]. But it has not been received yet.  I have tenderness especially when I chew.  Able to eat and drink.  No red or white spots anywhere in my mouth.  Return number [phone].

## 2015-08-06 ENCOUNTER — Other Ambulatory Visit: Payer: Self-pay | Admitting: Oncology

## 2015-08-07 ENCOUNTER — Encounter: Payer: Self-pay | Admitting: *Deleted

## 2015-08-07 ENCOUNTER — Ambulatory Visit (HOSPITAL_BASED_OUTPATIENT_CLINIC_OR_DEPARTMENT_OTHER): Payer: PRIVATE HEALTH INSURANCE | Admitting: Oncology

## 2015-08-07 ENCOUNTER — Telehealth: Payer: Self-pay | Admitting: Oncology

## 2015-08-07 ENCOUNTER — Ambulatory Visit (HOSPITAL_BASED_OUTPATIENT_CLINIC_OR_DEPARTMENT_OTHER): Payer: PRIVATE HEALTH INSURANCE

## 2015-08-07 ENCOUNTER — Other Ambulatory Visit (HOSPITAL_BASED_OUTPATIENT_CLINIC_OR_DEPARTMENT_OTHER): Payer: PRIVATE HEALTH INSURANCE

## 2015-08-07 VITALS — BP 109/61 | HR 67

## 2015-08-07 VITALS — BP 148/76 | HR 82 | Temp 98.2°F | Resp 17 | Ht 73.0 in | Wt 175.5 lb

## 2015-08-07 DIAGNOSIS — D649 Anemia, unspecified: Secondary | ICD-10-CM

## 2015-08-07 DIAGNOSIS — D701 Agranulocytosis secondary to cancer chemotherapy: Secondary | ICD-10-CM | POA: Diagnosis not present

## 2015-08-07 DIAGNOSIS — C20 Malignant neoplasm of rectum: Secondary | ICD-10-CM

## 2015-08-07 DIAGNOSIS — Z5111 Encounter for antineoplastic chemotherapy: Secondary | ICD-10-CM | POA: Diagnosis not present

## 2015-08-07 DIAGNOSIS — Z5112 Encounter for antineoplastic immunotherapy: Secondary | ICD-10-CM | POA: Diagnosis not present

## 2015-08-07 LAB — COMPREHENSIVE METABOLIC PANEL
ALT: 13 U/L (ref 0–55)
AST: 23 U/L (ref 5–34)
Albumin: 3.5 g/dL (ref 3.5–5.0)
Alkaline Phosphatase: 84 U/L (ref 40–150)
Anion Gap: 9 mEq/L (ref 3–11)
BUN: 8 mg/dL (ref 7.0–26.0)
CALCIUM: 8.9 mg/dL (ref 8.4–10.4)
CHLORIDE: 107 meq/L (ref 98–109)
CO2: 24 mEq/L (ref 22–29)
CREATININE: 1.1 mg/dL (ref 0.7–1.3)
EGFR: 86 mL/min/{1.73_m2} — ABNORMAL LOW (ref >90)
Glucose: 101 mg/dl (ref 70–140)
Potassium: 4.3 mEq/L (ref 3.5–5.1)
Sodium: 140 mEq/L (ref 136–145)
TOTAL PROTEIN: 7.7 g/dL (ref 6.4–8.3)

## 2015-08-07 LAB — CBC WITH DIFFERENTIAL/PLATELET
BASO%: 0.3 % (ref 0.0–2.0)
Basophils Absolute: 0 10*3/uL (ref 0.0–0.1)
EOS%: 0.7 % (ref 0.0–7.0)
Eosinophils Absolute: 0.1 10*3/uL (ref 0.0–0.5)
HEMATOCRIT: 28.7 % — AB (ref 38.4–49.9)
HEMOGLOBIN: 8.9 g/dL — AB (ref 13.0–17.1)
LYMPH#: 2.1 10*3/uL (ref 0.9–3.3)
LYMPH%: 23 % (ref 14.0–49.0)
MCH: 25.1 pg — ABNORMAL LOW (ref 27.2–33.4)
MCHC: 30.9 g/dL — AB (ref 32.0–36.0)
MCV: 81.3 fL (ref 79.3–98.0)
MONO#: 0.7 10*3/uL (ref 0.1–0.9)
MONO%: 8 % (ref 0.0–14.0)
NEUT%: 68 % (ref 39.0–75.0)
NEUTROS ABS: 6.1 10*3/uL (ref 1.5–6.5)
Platelets: 233 10*3/uL (ref 140–400)
RBC: 3.53 10*6/uL — ABNORMAL LOW (ref 4.20–5.82)
RDW: 19 % — AB (ref 11.0–14.6)
WBC: 8.9 10*3/uL (ref 4.0–10.3)

## 2015-08-07 LAB — UA PROTEIN, DIPSTICK - CHCC: Protein, ur: NEGATIVE mg/dL

## 2015-08-07 MED ORDER — SODIUM CHLORIDE 0.9 % IV SOLN
Freq: Once | INTRAVENOUS | Status: AC
  Administered 2015-08-07: 10:00:00 via INTRAVENOUS

## 2015-08-07 MED ORDER — ALPRAZOLAM 0.5 MG PO TABS: 0.5000 mg | ORAL_TABLET | Freq: Three times a day (TID) | ORAL | Status: DC | PRN

## 2015-08-07 MED ORDER — DEXTROSE 5 % IV SOLN
394.0000 mg/m2 | Freq: Once | INTRAVENOUS | Status: AC
  Administered 2015-08-07: 800 mg via INTRAVENOUS
  Filled 2015-08-07: qty 40

## 2015-08-07 MED ORDER — SODIUM CHLORIDE 0.9 % IV SOLN
5.0000 mg/kg | Freq: Once | INTRAVENOUS | Status: AC
Start: 2015-08-07 — End: 2015-08-07
  Administered 2015-08-07: 400 mg via INTRAVENOUS
  Filled 2015-08-07: qty 16

## 2015-08-07 MED ORDER — SODIUM CHLORIDE 0.9 % IV SOLN
2460.0000 mg/m2 | INTRAVENOUS | Status: DC
  Administered 2015-08-07: 5000 mg via INTRAVENOUS
  Filled 2015-08-07: qty 100

## 2015-08-07 MED ORDER — SODIUM CHLORIDE 0.9 % IV SOLN
Freq: Once | INTRAVENOUS | Status: AC
  Administered 2015-08-07: 10:00:00 via INTRAVENOUS
  Filled 2015-08-07: qty 5

## 2015-08-07 MED ORDER — SODIUM CHLORIDE 0.9% FLUSH
10.0000 mL | INTRAVENOUS | Status: DC | PRN
  Administered 2015-08-07: 10 mL via INTRAVENOUS
  Filled 2015-08-07: qty 10

## 2015-08-07 MED ORDER — FLUOROURACIL CHEMO INJECTION 2.5 GM/50ML
400.0000 mg/m2 | Freq: Once | INTRAVENOUS | Status: AC
  Administered 2015-08-07: 800 mg via INTRAVENOUS
  Filled 2015-08-07: qty 16

## 2015-08-07 MED ORDER — PALONOSETRON HCL INJECTION 0.25 MG/5ML
INTRAVENOUS | Status: AC
  Filled 2015-08-07: qty 5

## 2015-08-07 MED ORDER — DEXTROSE 5 % IV SOLN
Freq: Once | INTRAVENOUS | Status: AC
  Administered 2015-08-07: 11:00:00 via INTRAVENOUS

## 2015-08-07 MED ORDER — PALONOSETRON HCL INJECTION 0.25 MG/5ML
0.2500 mg | Freq: Once | INTRAVENOUS | Status: AC
  Administered 2015-08-07: 0.25 mg via INTRAVENOUS

## 2015-08-07 MED ORDER — DEXTROSE 5 % IV SOLN
85.0000 mg/m2 | Freq: Once | INTRAVENOUS | Status: AC
  Administered 2015-08-07: 175 mg via INTRAVENOUS
  Filled 2015-08-07: qty 35

## 2015-08-07 NOTE — Progress Notes (Signed)
  [facility] OFFICE PROGRESS NOTE   Diagnosis:  Colon cancer  INTERVAL HISTORY:   [name] returns as scheduled. He completed another cycle of FOLFOX/Avastin on 07/24/2015. He developed nausea and vomiting beginning on day 3 following chemotherapy. He was seen in the emergency room 07/30/2015. He was treated for dehydration. He reports mild bone pain after Neulasta. He has "tingling" at the left upper back. No other neurologic symptoms.  Objective:  Vital signs in last 24 hours:  Blood pressure 148/76, pulse 82, temperature 98.2 F (36.8 C), temperature source Oral, resp. rate 17, height 6\' 1"  (1.854 m), weight 175 lb 8 oz (79.606 kg), SpO2 100 %.    HEENT: No thrush or ulcers Resp: Lungs clear bilaterally Cardio: Regular rate and rhythm GI: No hepatomegaly Vascular: No leg edema  Skin: No rash at the left upper back.   Portacath/PICC-without erythema  Lab Results:  Lab Results  Component Value Date   WBC 8.9 08/07/2015   HGB 8.9* 08/07/2015   HCT 28.7* 08/07/2015   MCV 81.3 08/07/2015   PLT 233 08/07/2015   NEUTROABS 6.1 08/07/2015    Lab Results  Component Value Date   CEA1 8.0* 07/10/2015    Medications: I have reviewed the patient's current medications.  Assessment/Plan: 1. Rectal cancer, invasive adenocarcinoma confirmed at colonoscopy 05/22/2015, EUS staging 06/01/2015 revealed a uT3,N1 lesion at 7 cm from the anal verge  Staging CT abdomen/pelvis 05/15/2015 confirmed a mass at the rectosigmoid colon, a suspicious perirectal lymph node, and a 6 mm right liver lesion felt to most likely be a cyst  CT chest 05/30/2015 with indeterminate pulmonary nodules and multiple liver lesions suspicious for metastases  Ultrasound 06/13/2014 with no liver lesions identified  MRI abdomen 06/21/2015 consistent with multiple liver metastases  CT biopsy of liver lesion 06/26/2015. Pathology showed metastatic adenocarcinoma consistent with colonic  primary.  Cycle 1 FOLFOX 07/10/2015  Cycle 2 FOLFOX plus Avastin 07/24/2015 with Neulasta support  Cycle 3 FOLFOX plus Avastin 08/07/2015 2. Multiple colon polyps on the colonoscopy 05/22/2015, tubular villous adenoma and a tubular adenoma were removed 3. Anemia-likely secondary to rectal bleeding.  4. Family history of multiple cancers including breast and prostate cancer 5. Transient right arm numbness 06/20/2015 6. Port-A-Cath placement 07/06/2015 by Dr. Marcello Moores 7. Mild neutropenia secondary to chemotherapy following cycle 1 FOLFOX. Neulasta added with cycle 2 on day of pump discontinuation. 8.    Delayed nausea following chemotherapy-emend added with cycle 3 FOLFOX    Disposition:  Mr. Leys had delayed nausea and vomiting following cycle 2 FOLFOX. Emend was added to the premedication regimen today. He will complete another cycle of FOLFOX/Avastin today. Mr. Stare will again receive Neulasta.  He will return for an office visit and chemotherapy in 2 weeks. He will contact us for nausea following this cycle.  Betsy Coder, MD  08/07/2015  9:21 AM

## 2015-08-07 NOTE — Telephone Encounter (Signed)
Gave patient avs report and appointments for March and April.  °

## 2015-08-07 NOTE — Patient Instructions (Signed)
[facility] Discharge Instructions for Patients Receiving Chemotherapy  Today you received the following chemotherapy agents: Avastin, Oxaliplatin, Leucovorin, and Adrucil   To help prevent nausea and vomiting after your treatment, we encourage you to take your nausea medication as directed.    If you develop nausea and vomiting that is not controlled by your nausea medication, call the clinic.   BELOW ARE SYMPTOMS THAT SHOULD BE REPORTED IMMEDIATELY:  *FEVER GREATER THAN 100.5 F  *CHILLS WITH OR WITHOUT FEVER  NAUSEA AND VOMITING THAT IS NOT CONTROLLED WITH YOUR NAUSEA MEDICATION  *UNUSUAL SHORTNESS OF BREATH  *UNUSUAL BRUISING OR BLEEDING  TENDERNESS IN MOUTH AND THROAT WITH OR WITHOUT PRESENCE OF ULCERS  *URINARY PROBLEMS  *BOWEL PROBLEMS  UNUSUAL RASH Items with * indicate a potential emergency and should be followed up as soon as possible.  Feel free to call the clinic you have any questions or concerns. The clinic phone number is [phone].  Please show the CHEMO ALERT CARD at check-in to the Emergency Department and triage nurse.   

## 2015-08-07 NOTE — Progress Notes (Signed)
Oncology Nurse Navigator Documentation  Oncology Nurse Navigator Flowsheets 08/07/2015  Navigator Location [facility]-Med Onc  Navigator Encounter Type Treatment  Abnormal Finding Date -  Confirmed Diagnosis Date -  Treatment Initiated Date -  Patient Visit Type MedOnc  Treatment Phase Active Tx--FOLFOX/Avastin  Barriers/Navigation Needs No barriers at this time;No Questions;No Needs  Interventions None required  Support Groups/Services GI Support Group  Acuity -  Time Spent with Patient 30  Hunter Walker has a strong support system in his church and a good friend that has been through this journey to talk to. He did express an interest in coming to support group. Staying active and walks daily in the afternoon. Minimal side effects from his chemo.

## 2015-08-07 NOTE — Progress Notes (Signed)
Blood return noted before and after Adrucil push.  

## 2015-08-09 ENCOUNTER — Ambulatory Visit (HOSPITAL_BASED_OUTPATIENT_CLINIC_OR_DEPARTMENT_OTHER): Payer: PRIVATE HEALTH INSURANCE

## 2015-08-09 ENCOUNTER — Ambulatory Visit: Payer: PRIVATE HEALTH INSURANCE

## 2015-08-09 DIAGNOSIS — C20 Malignant neoplasm of rectum: Secondary | ICD-10-CM | POA: Diagnosis not present

## 2015-08-09 DIAGNOSIS — Z5189 Encounter for other specified aftercare: Secondary | ICD-10-CM

## 2015-08-09 DIAGNOSIS — Z95828 Presence of other vascular implants and grafts: Secondary | ICD-10-CM

## 2015-08-09 MED ORDER — HEPARIN SOD (PORK) LOCK FLUSH 100 UNIT/ML IV SOLN
500.0000 [IU] | Freq: Once | INTRAVENOUS | Status: AC | PRN
  Administered 2015-08-09: 500 [IU]
  Filled 2015-08-09: qty 5

## 2015-08-09 MED ORDER — PEGFILGRASTIM INJECTION 6 MG/0.6ML ~~LOC~~
6.0000 mg | PREFILLED_SYRINGE | Freq: Once | SUBCUTANEOUS | Status: AC
  Administered 2015-08-09: 6 mg via SUBCUTANEOUS
  Filled 2015-08-09: qty 0.6

## 2015-08-09 MED ORDER — SODIUM CHLORIDE 0.9% FLUSH
10.0000 mL | INTRAVENOUS | Status: DC | PRN
  Administered 2015-08-09: 10 mL
  Filled 2015-08-09: qty 10

## 2015-08-09 NOTE — Patient Instructions (Signed)

## 2015-08-18 ENCOUNTER — Other Ambulatory Visit: Payer: Self-pay | Admitting: Internal Medicine

## 2015-08-20 ENCOUNTER — Other Ambulatory Visit: Payer: Self-pay | Admitting: Oncology

## 2015-08-21 ENCOUNTER — Ambulatory Visit (HOSPITAL_BASED_OUTPATIENT_CLINIC_OR_DEPARTMENT_OTHER): Payer: PRIVATE HEALTH INSURANCE

## 2015-08-21 ENCOUNTER — Telehealth: Payer: Self-pay | Admitting: Nurse Practitioner

## 2015-08-21 ENCOUNTER — Ambulatory Visit: Payer: PRIVATE HEALTH INSURANCE

## 2015-08-21 ENCOUNTER — Ambulatory Visit (HOSPITAL_BASED_OUTPATIENT_CLINIC_OR_DEPARTMENT_OTHER): Payer: PRIVATE HEALTH INSURANCE | Admitting: Nurse Practitioner

## 2015-08-21 ENCOUNTER — Telehealth: Payer: Self-pay | Admitting: *Deleted

## 2015-08-21 ENCOUNTER — Other Ambulatory Visit (HOSPITAL_BASED_OUTPATIENT_CLINIC_OR_DEPARTMENT_OTHER): Payer: PRIVATE HEALTH INSURANCE

## 2015-08-21 VITALS — BP 141/80 | HR 82 | Temp 98.7°F | Resp 18 | Ht 73.0 in | Wt 166.8 lb

## 2015-08-21 DIAGNOSIS — Z95828 Presence of other vascular implants and grafts: Secondary | ICD-10-CM

## 2015-08-21 DIAGNOSIS — C787 Secondary malignant neoplasm of liver and intrahepatic bile duct: Secondary | ICD-10-CM

## 2015-08-21 DIAGNOSIS — D649 Anemia, unspecified: Secondary | ICD-10-CM | POA: Diagnosis not present

## 2015-08-21 DIAGNOSIS — C20 Malignant neoplasm of rectum: Secondary | ICD-10-CM

## 2015-08-21 DIAGNOSIS — Z5112 Encounter for antineoplastic immunotherapy: Secondary | ICD-10-CM

## 2015-08-21 DIAGNOSIS — Z5111 Encounter for antineoplastic chemotherapy: Secondary | ICD-10-CM

## 2015-08-21 LAB — CBC WITH DIFFERENTIAL/PLATELET
BASO%: 0.2 % (ref 0.0–2.0)
Basophils Absolute: 0 10*3/uL (ref 0.0–0.1)
EOS%: 0.8 % (ref 0.0–7.0)
Eosinophils Absolute: 0.1 10*3/uL (ref 0.0–0.5)
HEMATOCRIT: 29.9 % — AB (ref 38.4–49.9)
HEMOGLOBIN: 9.4 g/dL — AB (ref 13.0–17.1)
LYMPH#: 2 10*3/uL (ref 0.9–3.3)
LYMPH%: 15.2 % (ref 14.0–49.0)
MCH: 25.9 pg — AB (ref 27.2–33.4)
MCHC: 31.4 g/dL — AB (ref 32.0–36.0)
MCV: 82.4 fL (ref 79.3–98.0)
MONO#: 1.2 10*3/uL — AB (ref 0.1–0.9)
MONO%: 8.9 % (ref 0.0–14.0)
NEUT#: 10 10*3/uL — ABNORMAL HIGH (ref 1.5–6.5)
NEUT%: 74.9 % (ref 39.0–75.0)
PLATELETS: 283 10*3/uL (ref 140–400)
RBC: 3.63 10*6/uL — ABNORMAL LOW (ref 4.20–5.82)
RDW: 21 % — ABNORMAL HIGH (ref 11.0–14.6)
WBC: 13.3 10*3/uL — AB (ref 4.0–10.3)

## 2015-08-21 LAB — COMPREHENSIVE METABOLIC PANEL
ALBUMIN: 3.4 g/dL — AB (ref 3.5–5.0)
ALK PHOS: 98 U/L (ref 40–150)
ALT: 13 U/L (ref 0–55)
ANION GAP: 7 meq/L (ref 3–11)
AST: 19 U/L (ref 5–34)
BUN: 8.4 mg/dL (ref 7.0–26.0)
CALCIUM: 8.9 mg/dL (ref 8.4–10.4)
CHLORIDE: 106 meq/L (ref 98–109)
CO2: 27 mEq/L (ref 22–29)
Creatinine: 1.1 mg/dL (ref 0.7–1.3)
EGFR: 85 mL/min/{1.73_m2} — ABNORMAL LOW (ref >90)
Glucose: 105 mg/dl (ref 70–140)
POTASSIUM: 4.3 meq/L (ref 3.5–5.1)
Sodium: 140 mEq/L (ref 136–145)
Total Bilirubin: <0.3 mg/dL (ref 0.20–1.20)
Total Protein: 7.7 g/dL (ref 6.4–8.3)

## 2015-08-21 MED ORDER — SODIUM CHLORIDE 0.9 % IV SOLN
Freq: Once | INTRAVENOUS | Status: AC
  Administered 2015-08-21: 11:00:00 via INTRAVENOUS

## 2015-08-21 MED ORDER — OXALIPLATIN CHEMO INJECTION 100 MG/20ML
85.0000 mg/m2 | Freq: Once | INTRAVENOUS | Status: AC
  Administered 2015-08-21: 175 mg via INTRAVENOUS
  Filled 2015-08-21: qty 35

## 2015-08-21 MED ORDER — DEXTROSE 5 % IV SOLN
Freq: Once | INTRAVENOUS | Status: AC
  Administered 2015-08-21: 12:00:00 via INTRAVENOUS

## 2015-08-21 MED ORDER — SODIUM CHLORIDE 0.9% FLUSH
10.0000 mL | INTRAVENOUS | Status: DC | PRN
Start: 2015-08-21 — End: 2015-08-21
  Administered 2015-08-21: 10 mL via INTRAVENOUS
  Filled 2015-08-21: qty 10

## 2015-08-21 MED ORDER — PALONOSETRON HCL INJECTION 0.25 MG/5ML
0.2500 mg | Freq: Once | INTRAVENOUS | Status: AC
  Administered 2015-08-21: 0.25 mg via INTRAVENOUS

## 2015-08-21 MED ORDER — SODIUM CHLORIDE 0.9 % IV SOLN
Freq: Once | INTRAVENOUS | Status: AC
  Administered 2015-08-21: 12:00:00 via INTRAVENOUS
  Filled 2015-08-21: qty 5

## 2015-08-21 MED ORDER — SODIUM CHLORIDE 0.9 % IV SOLN
5.0000 mg/kg | Freq: Once | INTRAVENOUS | Status: AC
  Administered 2015-08-21: 400 mg via INTRAVENOUS
  Filled 2015-08-21: qty 16

## 2015-08-21 MED ORDER — PALONOSETRON HCL INJECTION 0.25 MG/5ML
INTRAVENOUS | Status: AC
  Filled 2015-08-21: qty 5

## 2015-08-21 MED ORDER — SODIUM CHLORIDE 0.9 % IV SOLN
2460.0000 mg/m2 | INTRAVENOUS | Status: DC
  Administered 2015-08-21: 5000 mg via INTRAVENOUS
  Filled 2015-08-21: qty 100

## 2015-08-21 MED ORDER — SODIUM CHLORIDE 0.9% FLUSH
10.0000 mL | INTRAVENOUS | Status: DC | PRN
  Filled 2015-08-21: qty 10

## 2015-08-21 MED ORDER — FLUOROURACIL CHEMO INJECTION 2.5 GM/50ML
400.0000 mg/m2 | Freq: Once | INTRAVENOUS | Status: AC
  Administered 2015-08-21: 800 mg via INTRAVENOUS
  Filled 2015-08-21: qty 16

## 2015-08-21 MED ORDER — DEXTROSE 5 % IV SOLN
394.0000 mg/m2 | Freq: Once | INTRAVENOUS | Status: AC
  Administered 2015-08-21: 800 mg via INTRAVENOUS
  Filled 2015-08-21: qty 40

## 2015-08-21 NOTE — Telephone Encounter (Signed)
Per staff message and POF I have scheduled appts. Advised scheduler of appts. [name]  

## 2015-08-21 NOTE — Patient Instructions (Signed)
[facility] Discharge Instructions for Patients Receiving Chemotherapy  Today you received the following chemotherapy agents; Oxaliplatin, Leucovorin, Adrucil,  And Avastin.   To help prevent nausea and vomiting after your treatment, we encourage you to take your nausea medication as directed.    If you develop nausea and vomiting that is not controlled by your nausea medication, call the clinic.   BELOW ARE SYMPTOMS THAT SHOULD BE REPORTED IMMEDIATELY:  *FEVER GREATER THAN 100.5 F  *CHILLS WITH OR WITHOUT FEVER  NAUSEA AND VOMITING THAT IS NOT CONTROLLED WITH YOUR NAUSEA MEDICATION  *UNUSUAL SHORTNESS OF BREATH  *UNUSUAL BRUISING OR BLEEDING  TENDERNESS IN MOUTH AND THROAT WITH OR WITHOUT PRESENCE OF ULCERS  *URINARY PROBLEMS  *BOWEL PROBLEMS  UNUSUAL RASH Items with * indicate a potential emergency and should be followed up as soon as possible.  Feel free to call the clinic you have any questions or concerns. The clinic phone number is [phone].  Please show the Home at check-in to the Emergency Department and triage nurse.

## 2015-08-21 NOTE — Telephone Encounter (Signed)
per pof to sch pt appt-sent MW emailto sch trmt-pt to getupdated copy b4 leaving** °

## 2015-08-21 NOTE — Progress Notes (Signed)
  [facility] OFFICE PROGRESS NOTE   Diagnosis: Rectal cancer   INTERVAL HISTORY:   Hunter Walker returns as scheduled. He completed cycle 3 FOLFOX/Avastin 08/07/2015. He denies nausea/vomiting. No mouth sores. No diarrhea. No numbness or tingling in his hands or feet. He is no longer having rectal pain. He intermittently notes hemorrhoid bleeding with constipation. No shortness of breath. No chest pain. No leg swelling or calf pain. He reports a good appetite. He is very active.  Objective:  Vital signs in last 24 hours:  Blood pressure 141/80, pulse 82, temperature 98.7 F (37.1 C), temperature source Oral, resp. rate 18, height 6\' 1"  (1.854 m), weight 166 lb 12.8 oz (75.66 kg), SpO2 100 %.    HEENT: No thrush or ulcers. Resp: Lungs clear bilaterally. Cardio: Regular rate and rhythm. GI: Abdomen soft and nontender. No hepatomegaly. Vascular: No leg edema. Neuro: Vibratory sense intact over the fingertips per tuning fork exam.  Port-A-Cath without erythema.   Lab Results:  Lab Results  Component Value Date   WBC 13.3* 08/21/2015   HGB 9.4* 08/21/2015   HCT 29.9* 08/21/2015   MCV 82.4 08/21/2015   PLT 283 08/21/2015   NEUTROABS 10.0* 08/21/2015    Imaging:  No results found.  Medications: I have reviewed the patient's current medications.  Assessment/Plan: 1. Rectal cancer, invasive adenocarcinoma confirmed at colonoscopy 05/22/2015, EUS staging 06/01/2015 revealed a uT3,N1 lesion at 7 cm from the anal verge  Staging CT abdomen/pelvis 05/15/2015 confirmed a mass at the rectosigmoid colon, a suspicious perirectal lymph node, and a 6 mm right liver lesion felt to most likely be a cyst  CT chest 05/30/2015 with indeterminate pulmonary nodules and multiple liver lesions suspicious for metastases  Ultrasound 06/13/2014 with no liver lesions identified  MRI abdomen 06/21/2015 consistent with multiple liver metastases  CT biopsy of liver lesion  06/26/2015. Pathology showed metastatic adenocarcinoma consistent with colonic primary.  Cycle 1 FOLFOX 07/10/2015  Cycle 2 FOLFOX plus Avastin 07/24/2015 with Neulasta support  Cycle 3 FOLFOX plus Avastin 08/07/2015  Cycle 4 FOLFOX plus Avastin 08/21/2015 2. Multiple colon polyps on the colonoscopy 05/22/2015, tubular villous adenoma and a tubular adenoma were removed 3. Anemia-likely secondary to rectal bleeding.  4. Family history of multiple cancers including breast and prostate cancer 5. Transient right arm numbness 06/20/2015 6. Port-A-Cath placement 07/06/2015 by Dr. Marcello Moores 7. Mild neutropenia secondary to chemotherapy following cycle 1 FOLFOX. Neulasta added with cycle 2. 8. Delayed nausea following chemotherapy-emend added with cycle 3 FOLFOX. No nausea following cycle 3.   Disposition: Hunter Walker appears stable. He has completed 3 cycles of FOLFOX/Avastin. Plan to proceed with cycle 4 today as scheduled. He will return for a follow-up visit and cycle 5 in 2 weeks. The plan is for a restaging CT evaluation following cycle 5. He will contact the office prior to his next visit with any problems.  Plan reviewed with Dr. Benay Spice.    Ned Card ANP/GNP-BC   08/21/2015  10:59 AM

## 2015-08-21 NOTE — Patient Instructions (Signed)

## 2015-08-23 ENCOUNTER — Ambulatory Visit: Payer: PRIVATE HEALTH INSURANCE

## 2015-08-23 ENCOUNTER — Ambulatory Visit (HOSPITAL_BASED_OUTPATIENT_CLINIC_OR_DEPARTMENT_OTHER): Payer: PRIVATE HEALTH INSURANCE

## 2015-08-23 DIAGNOSIS — C20 Malignant neoplasm of rectum: Secondary | ICD-10-CM

## 2015-08-23 DIAGNOSIS — Z5189 Encounter for other specified aftercare: Secondary | ICD-10-CM

## 2015-08-23 MED ORDER — PEGFILGRASTIM INJECTION 6 MG/0.6ML ~~LOC~~
6.0000 mg | PREFILLED_SYRINGE | Freq: Once | SUBCUTANEOUS | Status: AC
  Administered 2015-08-23: 6 mg via SUBCUTANEOUS
  Filled 2015-08-23: qty 0.6

## 2015-08-23 MED ORDER — HEPARIN SOD (PORK) LOCK FLUSH 100 UNIT/ML IV SOLN
500.0000 [IU] | Freq: Once | INTRAVENOUS | Status: AC | PRN
  Administered 2015-08-23: 500 [IU]
  Filled 2015-08-23: qty 5

## 2015-08-23 MED ORDER — SODIUM CHLORIDE 0.9% FLUSH
10.0000 mL | INTRAVENOUS | Status: DC | PRN
  Administered 2015-08-23: 10 mL
  Filled 2015-08-23: qty 10

## 2015-08-23 NOTE — Patient Instructions (Signed)

## 2015-08-23 NOTE — Progress Notes (Signed)
Neulasta injection given by flush nurse after home infusion pump disconnected. 

## 2015-08-30 ENCOUNTER — Telehealth: Payer: Self-pay | Admitting: Oncology

## 2015-08-30 NOTE — Telephone Encounter (Signed)
FAXED RECORDS TO [facility]

## 2015-09-03 ENCOUNTER — Other Ambulatory Visit: Payer: Self-pay | Admitting: Oncology

## 2015-09-04 ENCOUNTER — Other Ambulatory Visit (HOSPITAL_BASED_OUTPATIENT_CLINIC_OR_DEPARTMENT_OTHER): Payer: PRIVATE HEALTH INSURANCE

## 2015-09-04 ENCOUNTER — Telehealth: Payer: Self-pay | Admitting: Oncology

## 2015-09-04 ENCOUNTER — Ambulatory Visit: Payer: PRIVATE HEALTH INSURANCE | Admitting: Nutrition

## 2015-09-04 ENCOUNTER — Ambulatory Visit (HOSPITAL_BASED_OUTPATIENT_CLINIC_OR_DEPARTMENT_OTHER): Payer: PRIVATE HEALTH INSURANCE | Admitting: Oncology

## 2015-09-04 ENCOUNTER — Ambulatory Visit (HOSPITAL_BASED_OUTPATIENT_CLINIC_OR_DEPARTMENT_OTHER): Payer: PRIVATE HEALTH INSURANCE

## 2015-09-04 VITALS — BP 128/76 | HR 74 | Temp 98.4°F | Resp 18 | Ht 73.0 in | Wt 165.2 lb

## 2015-09-04 VITALS — BP 118/74 | HR 72

## 2015-09-04 DIAGNOSIS — C20 Malignant neoplasm of rectum: Secondary | ICD-10-CM

## 2015-09-04 DIAGNOSIS — C787 Secondary malignant neoplasm of liver and intrahepatic bile duct: Secondary | ICD-10-CM

## 2015-09-04 DIAGNOSIS — Z5111 Encounter for antineoplastic chemotherapy: Secondary | ICD-10-CM

## 2015-09-04 DIAGNOSIS — Z5112 Encounter for antineoplastic immunotherapy: Secondary | ICD-10-CM

## 2015-09-04 DIAGNOSIS — Z95828 Presence of other vascular implants and grafts: Secondary | ICD-10-CM

## 2015-09-04 DIAGNOSIS — D649 Anemia, unspecified: Secondary | ICD-10-CM | POA: Diagnosis not present

## 2015-09-04 LAB — CBC WITH DIFFERENTIAL/PLATELET
BASO%: 0.7 % (ref 0.0–2.0)
Basophils Absolute: 0.1 10*3/uL (ref 0.0–0.1)
EOS ABS: 0.1 10*3/uL (ref 0.0–0.5)
EOS%: 1.1 % (ref 0.0–7.0)
HCT: 30.4 % — ABNORMAL LOW (ref 38.4–49.9)
HEMOGLOBIN: 9.5 g/dL — AB (ref 13.0–17.1)
LYMPH%: 15.9 % (ref 14.0–49.0)
MCH: 25.9 pg — ABNORMAL LOW (ref 27.2–33.4)
MCHC: 31.1 g/dL — ABNORMAL LOW (ref 32.0–36.0)
MCV: 83.3 fL (ref 79.3–98.0)
MONO#: 0.8 10*3/uL (ref 0.1–0.9)
MONO%: 8.4 % (ref 0.0–14.0)
NEUT%: 73.9 % (ref 39.0–75.0)
NEUTROS ABS: 6.7 10*3/uL — AB (ref 1.5–6.5)
Platelets: 253 10*3/uL (ref 140–400)
RBC: 3.66 10*6/uL — AB (ref 4.20–5.82)
RDW: 23.8 % — AB (ref 11.0–14.6)
WBC: 9.1 10*3/uL (ref 4.0–10.3)
lymph#: 1.4 10*3/uL (ref 0.9–3.3)

## 2015-09-04 LAB — COMPREHENSIVE METABOLIC PANEL
ALT: 11 U/L (ref 0–55)
AST: 17 U/L (ref 5–34)
Albumin: 3.2 g/dL — ABNORMAL LOW (ref 3.5–5.0)
Alkaline Phosphatase: 93 U/L (ref 40–150)
Anion Gap: 7 mEq/L (ref 3–11)
BUN: 13.2 mg/dL (ref 7.0–26.0)
CO2: 27 meq/L (ref 22–29)
Calcium: 9 mg/dL (ref 8.4–10.4)
Chloride: 108 mEq/L (ref 98–109)
Creatinine: 1.2 mg/dL (ref 0.7–1.3)
EGFR: 78 mL/min/{1.73_m2} — AB (ref >90)
GLUCOSE: 103 mg/dL (ref 70–140)
Potassium: 4.4 mEq/L (ref 3.5–5.1)
SODIUM: 141 meq/L (ref 136–145)
TOTAL PROTEIN: 7.3 g/dL (ref 6.4–8.3)

## 2015-09-04 LAB — UA PROTEIN, DIPSTICK - CHCC: Protein, ur: <30 mg/dL

## 2015-09-04 MED ORDER — DEXTROSE 5 % IV SOLN
Freq: Once | INTRAVENOUS | Status: AC
  Administered 2015-09-04: 14:00:00 via INTRAVENOUS

## 2015-09-04 MED ORDER — LEUCOVORIN CALCIUM INJECTION 350 MG
394.0000 mg/m2 | Freq: Once | INTRAVENOUS | Status: AC
  Administered 2015-09-04: 800 mg via INTRAVENOUS
  Filled 2015-09-04: qty 40

## 2015-09-04 MED ORDER — OXALIPLATIN CHEMO INJECTION 100 MG/20ML
85.0000 mg/m2 | Freq: Once | INTRAVENOUS | Status: AC
  Administered 2015-09-04: 175 mg via INTRAVENOUS
  Filled 2015-09-04: qty 35

## 2015-09-04 MED ORDER — SODIUM CHLORIDE 0.9 % IV SOLN
5.0000 mg/kg | Freq: Once | INTRAVENOUS | Status: AC
  Administered 2015-09-04: 400 mg via INTRAVENOUS
  Filled 2015-09-04: qty 16

## 2015-09-04 MED ORDER — SODIUM CHLORIDE 0.9 % IV SOLN
Freq: Once | INTRAVENOUS | Status: AC
  Administered 2015-09-04: 13:00:00 via INTRAVENOUS
  Filled 2015-09-04: qty 5

## 2015-09-04 MED ORDER — PALONOSETRON HCL INJECTION 0.25 MG/5ML
INTRAVENOUS | Status: AC
  Filled 2015-09-04: qty 5

## 2015-09-04 MED ORDER — PALONOSETRON HCL INJECTION 0.25 MG/5ML
0.2500 mg | Freq: Once | INTRAVENOUS | Status: AC
  Administered 2015-09-04: 0.25 mg via INTRAVENOUS

## 2015-09-04 MED ORDER — SODIUM CHLORIDE 0.9% FLUSH
10.0000 mL | INTRAVENOUS | Status: DC | PRN
  Administered 2015-09-04: 10 mL via INTRAVENOUS
  Filled 2015-09-04: qty 10

## 2015-09-04 MED ORDER — FLUOROURACIL CHEMO INJECTION 2.5 GM/50ML
400.0000 mg/m2 | Freq: Once | INTRAVENOUS | Status: AC
  Administered 2015-09-04: 800 mg via INTRAVENOUS
  Filled 2015-09-04: qty 16

## 2015-09-04 MED ORDER — SODIUM CHLORIDE 0.9 % IV SOLN
Freq: Once | INTRAVENOUS | Status: AC
  Administered 2015-09-04: 12:00:00 via INTRAVENOUS

## 2015-09-04 MED ORDER — SODIUM CHLORIDE 0.9 % IV SOLN
2475.0000 mg/m2 | INTRAVENOUS | Status: DC
  Administered 2015-09-04: 5000 mg via INTRAVENOUS
  Filled 2015-09-04: qty 100

## 2015-09-04 NOTE — Progress Notes (Signed)
  [name] OFFICE PROGRESS NOTE   Diagnosis:  Colon cancer  INTERVAL HISTORY:    Hunter Walker completed another cycle of FOLFOX Hunter Walker 08/21/2015. He tolerated chemotherapy well. No neuropathy symptoms. No nausea/vomiting or diarrhea.  Objective:  Vital signs in last 24 hours:  Blood pressure 128/76, pulse 74, temperature 98.4 F (36.9 C), temperature source Oral, resp. rate 18, height 6\' 1"  (1.854 m), weight 165 lb 3.2 oz (74.934 kg), SpO2 100 %.    HEENT:  No thrush or ulcers Resp:  Lungs clear bilaterally Cardio:  Regular rate and rhythm GI:  No hepatomegaly, nontender Vascular:  No leg edema Neuro: mild decrease in vibratory sense at the fingertips bilaterally    Portacath/PICC-without erythema  Lab Results:  Lab Results  Component Value Date   WBC 9.1 09/04/2015   HGB 9.5* 09/04/2015   HCT 30.4* 09/04/2015   MCV 83.3 09/04/2015   PLT 253 09/04/2015   NEUTROABS 6.7* 09/04/2015      Lab Results  Component Value Date   CEA1 8.0* 07/10/2015    Medications: I have reviewed the patient's current medications.  Assessment/Plan: 1. Rectal cancer, invasive adenocarcinoma confirmed at colonoscopy 05/22/2015, EUS staging 06/01/2015 revealed a uT3,N1 lesion at 7 cm from the anal verge  Staging CT abdomen/pelvis 05/15/2015 confirmed a mass at the rectosigmoid colon, a suspicious perirectal lymph node, and a 6 mm right liver lesion felt to most likely be a cyst  CT chest 05/30/2015 with indeterminate pulmonary nodules and multiple liver lesions suspicious for metastases  Ultrasound 06/13/2014 with no liver lesions identified  MRI abdomen 06/21/2015 consistent with multiple liver metastases  CT biopsy of liver lesion 06/26/2015. Pathology showed metastatic adenocarcinoma consistent with colonic primary.  Cycle 1 FOLFOX 07/10/2015  Cycle 2 FOLFOX plus Avastin 07/24/2015 with Neulasta support  Cycle 3 FOLFOX plus Avastin 08/07/2015  Cycle 4  FOLFOX plus Avastin 08/21/2015  cycle 5 FOLFOX plus Avastin 09/04/2015 2. Multiple colon polyps on the colonoscopy 05/22/2015, tubular villous adenoma and a tubular adenoma were removed 3. Anemia-likely secondary to rectal bleeding.  4. Family history of multiple cancers including breast and prostate cancer 5. Transient right arm numbness 06/20/2015 6. Port-A-Cath placement 07/06/2015 by Dr. Marcello Moores 7. Mild neutropenia secondary to chemotherapy following cycle 1 FOLFOX. Neulasta added with cycle 2. 8. Delayed nausea following chemotherapy-emend added with cycle 3 FOLFOX.   Disposition:   Hunter Walker continues to tolerate the chemotherapy well. He will complete cycle 5 FOLFOX/Avastin today. He will undergo a restaging CT evaluation prior to an office visit in 2 weeks.  Betsy Coder, MD  09/04/2015  12:54 PM

## 2015-09-04 NOTE — Progress Notes (Signed)
Blood return noted before and after Adrucil push.  

## 2015-09-04 NOTE — Patient Instructions (Signed)
[facility] Discharge Instructions for Patients Receiving Chemotherapy  Today you received the following chemotherapy agents: Avastin, Oxaliplatin, Leucovorin, Adrucil  To help prevent nausea and vomiting after your treatment, we encourage you to take your nausea medication as directed.    If you develop nausea and vomiting that is not controlled by your nausea medication, call the clinic.   BELOW ARE SYMPTOMS THAT SHOULD BE REPORTED IMMEDIATELY:  *FEVER GREATER THAN 100.5 F  *CHILLS WITH OR WITHOUT FEVER  NAUSEA AND VOMITING THAT IS NOT CONTROLLED WITH YOUR NAUSEA MEDICATION  *UNUSUAL SHORTNESS OF BREATH  *UNUSUAL BRUISING OR BLEEDING  TENDERNESS IN MOUTH AND THROAT WITH OR WITHOUT PRESENCE OF ULCERS  *URINARY PROBLEMS  *BOWEL PROBLEMS  UNUSUAL RASH Items with * indicate a potential emergency and should be followed up as soon as possible.  Feel free to call the clinic you have any questions or concerns. The clinic phone number is [phone].  Please show the CHEMO ALERT CARD at check-in to the Emergency Department and triage nurse.   

## 2015-09-04 NOTE — Patient Instructions (Signed)

## 2015-09-04 NOTE — Progress Notes (Signed)
65 year old male diagnosed with colon cancer.  He is a patient of Dr. Julieanne Manson.  Past medical history includes GERD.  Medications include Xanax, Dulcolax, Magic mouthwash, ferrous sulfate, Protonix, and Compazine.  Labs include albumin 3.5.  Weight: 6 feet 1 inch. Weight: 165 pounds. Usual body weight: 180 pounds. BMI: 21.8.  Patient reports his weight has stabilized. He states he lost all his weight in his stomach and it was fat. Result of giving up alcohol intake. He reports a good appetite and good oral intake. He is eating meals and snacks. Enjoys a variety of foods. Denies nutrition impact symptoms. Reports weight loss resolved.    Nutrition diagnosis:  Unintended weight loss related to colon cancer and associated treatments as evidenced by 15 pound weight loss from usual body weight.  Intervention:  Provided support and encouragement for patient to continue strategies for adequate calories and protein intake. Encouraged patient to continue to strive for weight maintenance. Provided fact sheets on increasing calories and protein along with my contact information. Questions were answered.  Teach back method used.  Monitoring, evaluation, goals: Patient will tolerate adequate calories and protein to promote weight maintenance.  Next visit: Monday, May 1 during infusion.  **Disclaimer: This note was dictated with voice recognition software. Similar sounding words can inadvertently be transcribed and this note may contain transcription errors which may not have been corrected upon publication of note.**

## 2015-09-04 NOTE — Telephone Encounter (Signed)
Gave and printed appt sched and avs for pt for April and may °

## 2015-09-05 LAB — CEA: CEA: 5.2 ng/mL — ABNORMAL HIGH (ref 0.0–4.7)

## 2015-09-06 ENCOUNTER — Ambulatory Visit (HOSPITAL_BASED_OUTPATIENT_CLINIC_OR_DEPARTMENT_OTHER): Payer: PRIVATE HEALTH INSURANCE

## 2015-09-06 ENCOUNTER — Ambulatory Visit: Payer: PRIVATE HEALTH INSURANCE

## 2015-09-06 VITALS — BP 126/70 | HR 56 | Temp 98.3°F | Resp 18

## 2015-09-06 DIAGNOSIS — C20 Malignant neoplasm of rectum: Secondary | ICD-10-CM | POA: Diagnosis not present

## 2015-09-06 DIAGNOSIS — Z5189 Encounter for other specified aftercare: Secondary | ICD-10-CM | POA: Diagnosis not present

## 2015-09-06 MED ORDER — HEPARIN SOD (PORK) LOCK FLUSH 100 UNIT/ML IV SOLN
500.0000 [IU] | Freq: Once | INTRAVENOUS | Status: AC | PRN
  Administered 2015-09-06: 500 [IU]
  Filled 2015-09-06: qty 5

## 2015-09-06 MED ORDER — SODIUM CHLORIDE 0.9% FLUSH
10.0000 mL | INTRAVENOUS | Status: DC | PRN
  Administered 2015-09-06: 10 mL
  Filled 2015-09-06: qty 10

## 2015-09-06 MED ORDER — PEGFILGRASTIM INJECTION 6 MG/0.6ML ~~LOC~~
6.0000 mg | PREFILLED_SYRINGE | Freq: Once | SUBCUTANEOUS | Status: AC
  Administered 2015-09-06: 6 mg via SUBCUTANEOUS
  Filled 2015-09-06: qty 0.6

## 2015-09-06 NOTE — Patient Instructions (Signed)
Pegfilgrastim injection What is this medicine? PEGFILGRASTIM (PEG fil gra stim) is a long-acting granulocyte colony-stimulating factor that stimulates the growth of neutrophils, a type of white blood cell important in the body's fight against infection. It is used to reduce the incidence of fever and infection in patients with certain types of cancer who are receiving chemotherapy that affects the bone marrow, and to increase survival after being exposed to high doses of radiation. This medicine may be used for other purposes; ask your health care provider or pharmacist if you have questions. What should I tell my health care provider before I take this medicine? They need to know if you have any of these conditions: -kidney disease -latex allergy -ongoing radiation therapy -sickle cell disease -skin reactions to acrylic adhesives (On-Body Injector only) -an unusual or allergic reaction to pegfilgrastim, filgrastim, other medicines, foods, dyes, or preservatives -pregnant or trying to get pregnant -breast-feeding How should I use this medicine? This medicine is for injection under the skin. If you get this medicine at home, you will be taught how to prepare and give the pre-filled syringe or how to use the On-body Injector. Refer to the patient Instructions for Use for detailed instructions. Use exactly as directed. Take your medicine at regular intervals. Do not take your medicine more often than directed. It is important that you put your used needles and syringes in a special sharps container. Do not put them in a trash can. If you do not have a sharps container, call your pharmacist or healthcare provider to get one. Talk to your pediatrician regarding the use of this medicine in children. While this drug may be prescribed for selected conditions, precautions do apply. Overdosage: If you think you have taken too much of this medicine contact a poison control center or emergency room at  once. NOTE: This medicine is only for you. Do not share this medicine with others. What if I miss a dose? It is important not to miss your dose. Call your doctor or health care professional if you miss your dose. If you miss a dose due to an On-body Injector failure or leakage, a new dose should be administered as soon as possible using a single prefilled syringe for manual use. What may interact with this medicine? Interactions have not been studied. Give your health care provider a list of all the medicines, herbs, non-prescription drugs, or dietary supplements you use. Also tell them if you smoke, drink alcohol, or use illegal drugs. Some items may interact with your medicine. This list may not describe all possible interactions. Give your health care provider a list of all the medicines, herbs, non-prescription drugs, or dietary supplements you use. Also tell them if you smoke, drink alcohol, or use illegal drugs. Some items may interact with your medicine. What should I watch for while using this medicine? You may need blood work done while you are taking this medicine. If you are going to need a MRI, CT scan, or other procedure, tell your doctor that you are using this medicine (On-Body Injector only). What side effects may I notice from receiving this medicine? Side effects that you should report to your doctor or health care professional as soon as possible: -allergic reactions like skin rash, itching or hives, swelling of the face, lips, or tongue -dizziness -fever -pain, redness, or irritation at site where injected -pinpoint red spots on the skin -red or dark-brown urine -shortness of breath or breathing problems -stomach or side pain, or pain   at the shoulder -swelling -tiredness -trouble passing urine or change in the amount of urine Side effects that usually do not require medical attention (report to your doctor or health care professional if they continue or are  bothersome): -bone pain -muscle pain This list may not describe all possible side effects. Call your doctor for medical advice about side effects. You may report side effects to FDA at 1-800-FDA-1088. Where should I keep my medicine? Keep out of the reach of children. Store pre-filled syringes in a refrigerator between 2 and 8 degrees C (36 and 46 degrees F). Do not freeze. Keep in carton to protect from light. Throw away this medicine if it is left out of the refrigerator for more than 48 hours. Throw away any unused medicine after the expiration date. NOTE: This sheet is a summary. It may not cover all possible information. If you have questions about this medicine, talk to your doctor, pharmacist, or health care provider.    2016, Elsevier/Gold Standard. (2014-06-09 14:30:14)  

## 2015-09-06 NOTE — Progress Notes (Signed)
Neulasta injection given by infusion nurse after home infusion pump disconnected 

## 2015-09-07 ENCOUNTER — Encounter: Payer: Self-pay | Admitting: Oncology

## 2015-09-07 NOTE — Progress Notes (Signed)
Faxes sent 07/10/15 07/07/15 -2 is sent to medical records

## 2015-09-12 ENCOUNTER — Encounter: Payer: Self-pay | Admitting: Gastroenterology

## 2015-09-15 ENCOUNTER — Ambulatory Visit (HOSPITAL_COMMUNITY)
Admission: RE | Admit: 2015-09-15 | Discharge: 2015-09-15 | Disposition: A | Discharge status: Home / Self Care | Payer: PRIVATE HEALTH INSURANCE | Source: Ambulatory Visit | Attending: Nurse Practitioner | Admitting: Nurse Practitioner

## 2015-09-15 ENCOUNTER — Encounter (HOSPITAL_COMMUNITY): Payer: Self-pay

## 2015-09-15 DIAGNOSIS — K7689 Other specified diseases of liver: Secondary | ICD-10-CM | POA: Diagnosis not present

## 2015-09-15 DIAGNOSIS — C20 Malignant neoplasm of rectum: Secondary | ICD-10-CM | POA: Insufficient documentation

## 2015-09-15 DIAGNOSIS — Z9221 Personal history of antineoplastic chemotherapy: Secondary | ICD-10-CM | POA: Diagnosis not present

## 2015-09-15 DIAGNOSIS — I2510 Atherosclerotic heart disease of native coronary artery without angina pectoris: Secondary | ICD-10-CM | POA: Diagnosis not present

## 2015-09-15 DIAGNOSIS — R918 Other nonspecific abnormal finding of lung field: Secondary | ICD-10-CM | POA: Insufficient documentation

## 2015-09-15 DIAGNOSIS — C787 Secondary malignant neoplasm of liver and intrahepatic bile duct: Secondary | ICD-10-CM | POA: Diagnosis not present

## 2015-09-15 DIAGNOSIS — N400 Enlarged prostate without lower urinary tract symptoms: Secondary | ICD-10-CM | POA: Diagnosis not present

## 2015-09-15 MED ORDER — IOPAMIDOL (ISOVUE-300) INJECTION 61%
100.0000 mL | Freq: Once | INTRAVENOUS | Status: AC | PRN
  Administered 2015-09-15: 100 mL via INTRAVENOUS

## 2015-09-17 ENCOUNTER — Other Ambulatory Visit: Payer: Self-pay | Admitting: Oncology

## 2015-09-18 ENCOUNTER — Ambulatory Visit (HOSPITAL_BASED_OUTPATIENT_CLINIC_OR_DEPARTMENT_OTHER): Payer: PRIVATE HEALTH INSURANCE | Admitting: Oncology

## 2015-09-18 ENCOUNTER — Other Ambulatory Visit (HOSPITAL_BASED_OUTPATIENT_CLINIC_OR_DEPARTMENT_OTHER): Payer: PRIVATE HEALTH INSURANCE

## 2015-09-18 ENCOUNTER — Telehealth: Payer: Self-pay | Admitting: Oncology

## 2015-09-18 ENCOUNTER — Ambulatory Visit (HOSPITAL_BASED_OUTPATIENT_CLINIC_OR_DEPARTMENT_OTHER): Payer: PRIVATE HEALTH INSURANCE

## 2015-09-18 VITALS — BP 130/86 | HR 79 | Temp 98.1°F | Resp 20 | Ht 73.0 in | Wt 167.1 lb

## 2015-09-18 DIAGNOSIS — Z5111 Encounter for antineoplastic chemotherapy: Secondary | ICD-10-CM | POA: Diagnosis not present

## 2015-09-18 DIAGNOSIS — D649 Anemia, unspecified: Secondary | ICD-10-CM | POA: Diagnosis not present

## 2015-09-18 DIAGNOSIS — C20 Malignant neoplasm of rectum: Secondary | ICD-10-CM

## 2015-09-18 DIAGNOSIS — Z5112 Encounter for antineoplastic immunotherapy: Secondary | ICD-10-CM

## 2015-09-18 DIAGNOSIS — C787 Secondary malignant neoplasm of liver and intrahepatic bile duct: Secondary | ICD-10-CM

## 2015-09-18 LAB — COMPREHENSIVE METABOLIC PANEL WITH GFR
ALT: 10 U/L (ref 0–55)
AST: 19 U/L (ref 5–34)
Albumin: 3.3 g/dL — ABNORMAL LOW (ref 3.5–5.0)
Alkaline Phosphatase: 91 U/L (ref 40–150)
Anion Gap: 7 meq/L (ref 3–11)
BUN: 10.9 mg/dL (ref 7.0–26.0)
CO2: 25 meq/L (ref 22–29)
Calcium: 9.2 mg/dL (ref 8.4–10.4)
Chloride: 108 meq/L (ref 98–109)
Creatinine: 1 mg/dL (ref 0.7–1.3)
EGFR: 90 ml/min/1.73 m2 — ABNORMAL LOW
Glucose: 98 mg/dL (ref 70–140)
Potassium: 4.6 meq/L (ref 3.5–5.1)
Sodium: 140 meq/L (ref 136–145)
Total Bilirubin: <0.3 mg/dL (ref 0.20–1.20)
Total Protein: 7.3 g/dL (ref 6.4–8.3)

## 2015-09-18 LAB — CBC WITH DIFFERENTIAL/PLATELET
BASO%: 0.3 % (ref 0.0–2.0)
Basophils Absolute: 0 10*3/uL (ref 0.0–0.1)
EOS%: 0.9 % (ref 0.0–7.0)
Eosinophils Absolute: 0.1 10*3/uL (ref 0.0–0.5)
HEMATOCRIT: 31.7 % — AB (ref 38.4–49.9)
HEMOGLOBIN: 9.8 g/dL — AB (ref 13.0–17.1)
LYMPH#: 2 10*3/uL (ref 0.9–3.3)
LYMPH%: 21.7 % (ref 14.0–49.0)
MCH: 26.3 pg — AB (ref 27.2–33.4)
MCHC: 30.9 g/dL — ABNORMAL LOW (ref 32.0–36.0)
MCV: 85 fL (ref 79.3–98.0)
MONO#: 0.8 10*3/uL (ref 0.1–0.9)
MONO%: 8.6 % (ref 0.0–14.0)
NEUT%: 68.5 % (ref 39.0–75.0)
NEUTROS ABS: 6.3 10*3/uL (ref 1.5–6.5)
NRBC: 0 % (ref 0–0)
Platelets: 210 10*3/uL (ref 140–400)
RBC: 3.73 10*6/uL — ABNORMAL LOW (ref 4.20–5.82)
RDW: 23.3 % — AB (ref 11.0–14.6)
WBC: 9.2 10*3/uL (ref 4.0–10.3)

## 2015-09-18 LAB — UA PROTEIN, DIPSTICK - CHCC: PROTEIN: NEGATIVE mg/dL

## 2015-09-18 MED ORDER — SODIUM CHLORIDE 0.9 % IV SOLN
5.0000 mg/kg | Freq: Once | INTRAVENOUS | Status: AC
  Administered 2015-09-18: 400 mg via INTRAVENOUS
  Filled 2015-09-18: qty 16

## 2015-09-18 MED ORDER — SODIUM CHLORIDE 0.9% FLUSH
10.0000 mL | INTRAVENOUS | Status: DC | PRN
Start: 2015-09-18 — End: 2015-12-11
  Filled 2015-09-18: qty 10

## 2015-09-18 MED ORDER — PALONOSETRON HCL INJECTION 0.25 MG/5ML
INTRAVENOUS | Status: AC
  Filled 2015-09-18: qty 5

## 2015-09-18 MED ORDER — OXALIPLATIN CHEMO INJECTION 100 MG/20ML
85.0000 mg/m2 | Freq: Once | INTRAVENOUS | Status: AC
  Administered 2015-09-18: 175 mg via INTRAVENOUS
  Filled 2015-09-18: qty 35

## 2015-09-18 MED ORDER — DEXTROSE 5 % IV SOLN
Freq: Once | INTRAVENOUS | Status: AC
  Administered 2015-09-18: 11:00:00 via INTRAVENOUS

## 2015-09-18 MED ORDER — SODIUM CHLORIDE 0.9 % IV SOLN
Freq: Once | INTRAVENOUS | Status: AC
  Administered 2015-09-18: 14:00:00 via INTRAVENOUS

## 2015-09-18 MED ORDER — FLUOROURACIL CHEMO INJECTION 2.5 GM/50ML
400.0000 mg/m2 | Freq: Once | INTRAVENOUS | Status: AC
  Administered 2015-09-18: 800 mg via INTRAVENOUS
  Filled 2015-09-18: qty 16

## 2015-09-18 MED ORDER — SODIUM CHLORIDE 0.9 % IV SOLN
2455.0000 mg/m2 | INTRAVENOUS | Status: AC
  Administered 2015-09-18: 5000 mg via INTRAVENOUS
  Filled 2015-09-18: qty 100

## 2015-09-18 MED ORDER — LEUCOVORIN CALCIUM INJECTION 350 MG
394.0000 mg/m2 | Freq: Once | INTRAVENOUS | Status: AC
  Administered 2015-09-18: 800 mg via INTRAVENOUS
  Filled 2015-09-18: qty 40

## 2015-09-18 MED ORDER — SODIUM CHLORIDE 0.9 % IV SOLN
Freq: Once | INTRAVENOUS | Status: AC
  Administered 2015-09-18: 11:00:00 via INTRAVENOUS
  Filled 2015-09-18: qty 5

## 2015-09-18 MED ORDER — PALONOSETRON HCL INJECTION 0.25 MG/5ML
0.2500 mg | Freq: Once | INTRAVENOUS | Status: AC
  Administered 2015-09-18: 0.25 mg via INTRAVENOUS

## 2015-09-18 NOTE — Patient Instructions (Signed)
[facility] Discharge Instructions for Patients Receiving Chemotherapy  Today you received the following chemotherapy agents: Avastin, 5FU, leucovorin, oxaliplatin  To help prevent nausea and vomiting after your treatment, we encourage you to take your nausea medication as prescribed by your physician.   If you develop nausea and vomiting that is not controlled by your nausea medication, call the clinic.   BELOW ARE SYMPTOMS THAT SHOULD BE REPORTED IMMEDIATELY:  *FEVER GREATER THAN 100.5 F  *CHILLS WITH OR WITHOUT FEVER  NAUSEA AND VOMITING THAT IS NOT CONTROLLED WITH YOUR NAUSEA MEDICATION  *UNUSUAL SHORTNESS OF BREATH  *UNUSUAL BRUISING OR BLEEDING  TENDERNESS IN MOUTH AND THROAT WITH OR WITHOUT PRESENCE OF ULCERS  *URINARY PROBLEMS  *BOWEL PROBLEMS  UNUSUAL RASH Items with * indicate a potential emergency and should be followed up as soon as possible.  Feel free to call the clinic you have any questions or concerns. The clinic phone number is [phone].  Please show the [place] at check-in to the Emergency Department and triage nurse.

## 2015-09-18 NOTE — Progress Notes (Signed)
[place] [facility] OFFICE PROGRESS NOTE   Diagnosis: Colon cancer  INTERVAL HISTORY:   Hunter Walker returns as scheduled. He completed another cycle of FOLFOX/Avastin 09/04/2015. He tolerated chemotherapy well. No nausea/vomiting, diarrhea, or neuropathy symptoms. No complaint.   Objective:  Vital signs in last 24 hours:  Blood pressure 130/86, pulse 79, temperature 98.1 F (36.7 C), temperature source Oral, resp. rate 20, height 6\' 1"  (1.854 m), weight 167 lb 1.6 oz (75.796 kg), SpO2 100 %.    HEENT: No thrush or ulcers Resp: Lungs clear bilaterally Cardio: Regular rate and rhythm GI: No hepatomegaly, nontender Vascular: No leg edema Neuro: Mild decrease in vibratory sense at the fingertips bilaterally  Skin: Hyperpigmentation of the hands   Portacath/PICC-without erythema  Lab Results:  Lab Results  Component Value Date   WBC 9.1 09/04/2015   HGB 9.5* 09/04/2015   HCT 30.4* 09/04/2015   MCV 83.3 09/04/2015   PLT 253 09/04/2015   NEUTROABS 6.7* 09/04/2015      Lab Results  Component Value Date   CEA1 5.2* 09/04/2015    Imaging:  Ct Chest W Contrast  09/15/2015  CLINICAL DATA:  Rectal cancer diagnosed December 2016 presenting for restaging on chemotherapy. Biopsy-proven liver metastases on 06/26/2015. EXAM: CT CHEST, ABDOMEN, AND PELVIS WITH CONTRAST TECHNIQUE: Multidetector CT imaging of the chest, abdomen and pelvis was performed following the standard protocol during bolus administration of intravenous contrast. CONTRAST:  182mL ISOVUE-300 IOPAMIDOL (ISOVUE-300) INJECTION 61% COMPARISON:  05/15/2015 CT abdomen/ pelvis. 05/30/2015 CT chest. MRI abdomen 06/21/2015. FINDINGS: CT CHEST Mediastinum/Nodes: Normal heart size. No pericardial fluid/thickening. Right internal jugular MediPort terminates in the lower third of the superior vena cava. Great vessels are normal in course and caliber. No central pulmonary emboli. Normal visualized thyroid. Normal  esophagus. No pathologically enlarged axillary, mediastinal or hilar lymph nodes. Lungs/Pleura: No pneumothorax. No pleural effusion. Right upper lobe 3 mm pulmonary nodule (series 5/ image 30) appears slightly decreased from 5 mm on 05/30/2015. Separate 2 mm right upper lobe pulmonary nodule (series 5/image 20) is unchanged. No acute consolidative airspace disease or new significant pulmonary nodules. Musculoskeletal: No aggressive appearing focal osseous lesions. Mild degenerative changes in the thoracic spine. CT ABDOMEN AND PELVIS Hepatobiliary: Liver metastases are decreased in size and number, with residual metastases as follows: - right upper liver lobe 0.5 x 0.3 cm metastasis (series 2/ image 56), previously 1.1 x 0.9 cm, decreased - right lower liver lobe 0.9 x 0.5 cm metastasis (series 2/ image 66), previously 1.2 x 0.7 cm, decreased - lateral segment left liver lobe 0.5 x 0.5 cm metastasis (series 2/ image 57), previously 1.1 x 0.8 cm, decreased No new liver lesions. Stable subcentimeter liver cyst in the inferior right liver lobe. Normal gallbladder with no radiopaque cholelithiasis. No biliary ductal dilatation. Pancreas: Stable mildly dilated main pancreatic duct (4 mm diameter), with otherwise normal pancreas with no pancreatic mass. Spleen: Normal size. No mass. Adrenals/Urinary Tract: Normal adrenals. Normal kidneys with no hydronephrosis and no renal mass. Normal bladder. Stomach/Bowel: Grossly normal stomach. Normal caliber small bowel with no small bowel wall thickening. Normal appendix. Decreased but persistent eccentric wall thickening and hyperenhancement in the posterior mid to upper rectum involving an approximately 4.4 cm in length segment of rectum, decreased from previous segment length of involvement of 6.5 cm. Otherwise no large bowel wall thickening or pericolonic fat stranding. Moderate stool throughout the colon. Vascular/Lymphatic: Atherosclerotic nonaneurysmal abdominal aorta.  Patent portal, splenic, hepatic and renal veins. No pathologically enlarged  lymph nodes in the abdomen or pelvis. Previously described 0.6 cm left perirectal node is not discretely visualized on this study. Reproductive: Stable moderate prostatomegaly. Other: No pneumoperitoneum, ascites or focal fluid collection. Musculoskeletal: No aggressive appearing focal osseous lesions. Moderate degenerative changes in the lumbar spine. IMPRESSION: 1. Partial treatment response at the primary rectal tumor site. 2. Significant partial treatment response in the liver metastases. 3. Stable to decreased size of two tiny right upper lobe pulmonary nodules. 4. No new or progressive metastatic disease in the chest, abdomen or pelvis. 5. Moderate prostatomegaly. Electronically Signed   By: Ilona Sorrel M.D.   On: 09/15/2015 11:14   Ct Abdomen Pelvis W Contrast  09/15/2015  CLINICAL DATA:  Rectal cancer diagnosed December 2016 presenting for restaging on chemotherapy. Biopsy-proven liver metastases on 06/26/2015. EXAM: CT CHEST, ABDOMEN, AND PELVIS WITH CONTRAST TECHNIQUE: Multidetector CT imaging of the chest, abdomen and pelvis was performed following the standard protocol during bolus administration of intravenous contrast. CONTRAST:  138mL ISOVUE-300 IOPAMIDOL (ISOVUE-300) INJECTION 61% COMPARISON:  05/15/2015 CT abdomen/ pelvis. 05/30/2015 CT chest. MRI abdomen 06/21/2015. FINDINGS: CT CHEST Mediastinum/Nodes: Normal heart size. No pericardial fluid/thickening. Right internal jugular MediPort terminates in the lower third of the superior vena cava. Great vessels are normal in course and caliber. No central pulmonary emboli. Normal visualized thyroid. Normal esophagus. No pathologically enlarged axillary, mediastinal or hilar lymph nodes. Lungs/Pleura: No pneumothorax. No pleural effusion. Right upper lobe 3 mm pulmonary nodule (series 5/ image 30) appears slightly decreased from 5 mm on 05/30/2015. Separate 2 mm right upper  lobe pulmonary nodule (series 5/image 20) is unchanged. No acute consolidative airspace disease or new significant pulmonary nodules. Musculoskeletal: No aggressive appearing focal osseous lesions. Mild degenerative changes in the thoracic spine. CT ABDOMEN AND PELVIS Hepatobiliary: Liver metastases are decreased in size and number, with residual metastases as follows: - right upper liver lobe 0.5 x 0.3 cm metastasis (series 2/ image 56), previously 1.1 x 0.9 cm, decreased - right lower liver lobe 0.9 x 0.5 cm metastasis (series 2/ image 66), previously 1.2 x 0.7 cm, decreased - lateral segment left liver lobe 0.5 x 0.5 cm metastasis (series 2/ image 57), previously 1.1 x 0.8 cm, decreased No new liver lesions. Stable subcentimeter liver cyst in the inferior right liver lobe. Normal gallbladder with no radiopaque cholelithiasis. No biliary ductal dilatation. Pancreas: Stable mildly dilated main pancreatic duct (4 mm diameter), with otherwise normal pancreas with no pancreatic mass. Spleen: Normal size. No mass. Adrenals/Urinary Tract: Normal adrenals. Normal kidneys with no hydronephrosis and no renal mass. Normal bladder. Stomach/Bowel: Grossly normal stomach. Normal caliber small bowel with no small bowel wall thickening. Normal appendix. Decreased but persistent eccentric wall thickening and hyperenhancement in the posterior mid to upper rectum involving an approximately 4.4 cm in length segment of rectum, decreased from previous segment length of involvement of 6.5 cm. Otherwise no large bowel wall thickening or pericolonic fat stranding. Moderate stool throughout the colon. Vascular/Lymphatic: Atherosclerotic nonaneurysmal abdominal aorta. Patent portal, splenic, hepatic and renal veins. No pathologically enlarged lymph nodes in the abdomen or pelvis. Previously described 0.6 cm left perirectal node is not discretely visualized on this study. Reproductive: Stable moderate prostatomegaly. Other: No  pneumoperitoneum, ascites or focal fluid collection. Musculoskeletal: No aggressive appearing focal osseous lesions. Moderate degenerative changes in the lumbar spine. IMPRESSION: 1. Partial treatment response at the primary rectal tumor site. 2. Significant partial treatment response in the liver metastases. 3. Stable to decreased size of two tiny right  upper lobe pulmonary nodules. 4. No new or progressive metastatic disease in the chest, abdomen or pelvis. 5. Moderate prostatomegaly. Electronically Signed   By: Ilona Sorrel M.D.   On: 09/15/2015 11:14  CT images reviewed with Mr. Mannor  Medications: I have reviewed the patient's current medications.  Assessment/Plan: 1. Rectal cancer, invasive adenocarcinoma confirmed at colonoscopy 05/22/2015, EUS staging 06/01/2015 revealed a uT3,N1 lesion at 7 cm from the anal verge  Staging CT abdomen/pelvis 05/15/2015 confirmed a mass at the rectosigmoid colon, a suspicious perirectal lymph node, and a 6 mm right liver lesion felt to most likely be a cyst  CT chest 05/30/2015 with indeterminate pulmonary nodules and multiple liver lesions suspicious for metastases  Ultrasound 06/13/2014 with no liver lesions identified  MRI abdomen 06/21/2015 consistent with multiple liver metastases  CT biopsy of liver lesion 06/26/2015. Pathology showed metastatic adenocarcinoma consistent with colonic primary.  Cycle 1 FOLFOX 07/10/2015  Cycle 2 FOLFOX plus Avastin 07/24/2015 with Neulasta support  Cycle 3 FOLFOX plus Avastin 08/07/2015  Cycle 4 FOLFOX plus Avastin 08/21/2015  cycle 5 FOLFOX plus Avastin 09/04/2015  Restaging CTs 09/15/2015 revealed a decrease in the size of liver lesions, decreased rectal primary, stable/decreased size of tiny lung nodules  Cycle 6 FOLFOX plus Avastin 09/18/2015 2. Multiple colon polyps on the colonoscopy 05/22/2015, tubular villous adenoma and a tubular adenoma were removed 3. Anemia-likely secondary to rectal  bleeding.  4. Family history of multiple cancers including breast and prostate cancer 5. Transient right arm numbness 06/20/2015 6. Port-A-Cath placement 07/06/2015 by Dr. Marcello Moores 7. Mild neutropenia secondary to chemotherapy following cycle 1 FOLFOX. Neulasta added with cycle 2. 8. Delayed nausea following chemotherapy-emend added with cycle 3 FOLFOX.    Disposition:  Mr. Brum has completed 5 cycles of FOLFOX. He has received Avastin with the last 4 cycles. The restaging CT confirms a response to therapy. The plan is to continue FOLFOX/Avastin. Mr. Arth will return for an office visit and chemotherapy in 2 weeks.  Betsy Coder, MD  09/18/2015  9:07 AM

## 2015-09-18 NOTE — Telephone Encounter (Signed)
Gave and printed appt sched and avs for pt for April and May °

## 2015-09-20 ENCOUNTER — Ambulatory Visit (HOSPITAL_BASED_OUTPATIENT_CLINIC_OR_DEPARTMENT_OTHER): Payer: PRIVATE HEALTH INSURANCE

## 2015-09-20 ENCOUNTER — Ambulatory Visit: Payer: PRIVATE HEALTH INSURANCE

## 2015-09-20 VITALS — BP 127/71 | HR 70 | Temp 98.3°F | Resp 20

## 2015-09-20 DIAGNOSIS — Z5189 Encounter for other specified aftercare: Secondary | ICD-10-CM

## 2015-09-20 DIAGNOSIS — C20 Malignant neoplasm of rectum: Secondary | ICD-10-CM | POA: Diagnosis not present

## 2015-09-20 MED ORDER — SODIUM CHLORIDE 0.9% FLUSH
10.0000 mL | INTRAVENOUS | Status: DC | PRN
  Administered 2015-09-20: 10 mL
  Filled 2015-09-20: qty 10

## 2015-09-20 MED ORDER — HEPARIN SOD (PORK) LOCK FLUSH 100 UNIT/ML IV SOLN
500.0000 [IU] | Freq: Once | INTRAVENOUS | Status: AC | PRN
  Administered 2015-09-20: 500 [IU]
  Filled 2015-09-20: qty 5

## 2015-09-20 MED ORDER — PEGFILGRASTIM INJECTION 6 MG/0.6ML ~~LOC~~
6.0000 mg | PREFILLED_SYRINGE | Freq: Once | SUBCUTANEOUS | Status: AC
  Administered 2015-09-20: 6 mg via SUBCUTANEOUS
  Filled 2015-09-20: qty 0.6

## 2015-09-20 NOTE — Patient Instructions (Signed)
Pegfilgrastim injection What is this medicine? PEGFILGRASTIM (PEG fil gra stim) is a long-acting granulocyte colony-stimulating factor that stimulates the growth of neutrophils, a type of white blood cell important in the body's fight against infection. It is used to reduce the incidence of fever and infection in patients with certain types of cancer who are receiving chemotherapy that affects the bone marrow, and to increase survival after being exposed to high doses of radiation. This medicine may be used for other purposes; ask your health care provider or pharmacist if you have questions. What should I tell my health care provider before I take this medicine? They need to know if you have any of these conditions: -kidney disease -latex allergy -ongoing radiation therapy -sickle cell disease -skin reactions to acrylic adhesives (On-Body Injector only) -an unusual or allergic reaction to pegfilgrastim, filgrastim, other medicines, foods, dyes, or preservatives -pregnant or trying to get pregnant -breast-feeding How should I use this medicine? This medicine is for injection under the skin. If you get this medicine at home, you will be taught how to prepare and give the pre-filled syringe or how to use the On-body Injector. Refer to the patient Instructions for Use for detailed instructions. Use exactly as directed. Take your medicine at regular intervals. Do not take your medicine more often than directed. It is important that you put your used needles and syringes in a special sharps container. Do not put them in a trash can. If you do not have a sharps container, call your pharmacist or healthcare provider to get one. Talk to your pediatrician regarding the use of this medicine in children. While this drug may be prescribed for selected conditions, precautions do apply. Overdosage: If you think you have taken too much of this medicine contact a poison control center or emergency room at  once. NOTE: This medicine is only for you. Do not share this medicine with others. What if I miss a dose? It is important not to miss your dose. Call your doctor or health care professional if you miss your dose. If you miss a dose due to an On-body Injector failure or leakage, a new dose should be administered as soon as possible using a single prefilled syringe for manual use. What may interact with this medicine? Interactions have not been studied. Give your health care provider a list of all the medicines, herbs, non-prescription drugs, or dietary supplements you use. Also tell them if you smoke, drink alcohol, or use illegal drugs. Some items may interact with your medicine. This list may not describe all possible interactions. Give your health care provider a list of all the medicines, herbs, non-prescription drugs, or dietary supplements you use. Also tell them if you smoke, drink alcohol, or use illegal drugs. Some items may interact with your medicine. What should I watch for while using this medicine? You may need blood work done while you are taking this medicine. If you are going to need a MRI, CT scan, or other procedure, tell your doctor that you are using this medicine (On-Body Injector only). What side effects may I notice from receiving this medicine? Side effects that you should report to your doctor or health care professional as soon as possible: -allergic reactions like skin rash, itching or hives, swelling of the face, lips, or tongue -dizziness -fever -pain, redness, or irritation at site where injected -pinpoint red spots on the skin -red or dark-brown urine -shortness of breath or breathing problems -stomach or side pain, or pain   at the shoulder -swelling -tiredness -trouble passing urine or change in the amount of urine Side effects that usually do not require medical attention (report to your doctor or health care professional if they continue or are  bothersome): -bone pain -muscle pain This list may not describe all possible side effects. Call your doctor for medical advice about side effects. You may report side effects to FDA at 1-800-FDA-1088. Where should I keep my medicine? Keep out of the reach of children. Store pre-filled syringes in a refrigerator between 2 and 8 degrees C (36 and 46 degrees F). Do not freeze. Keep in carton to protect from light. Throw away this medicine if it is left out of the refrigerator for more than 48 hours. Throw away any unused medicine after the expiration date. NOTE: This sheet is a summary. It may not cover all possible information. If you have questions about this medicine, talk to your doctor, pharmacist, or health care provider.    2016, Elsevier/Gold Standard. (2014-06-09 14:30:14)  

## 2015-09-20 NOTE — Progress Notes (Signed)
Neulasta injection given by infusion nurse after home infusion pump disconnected 

## 2015-09-29 ENCOUNTER — Other Ambulatory Visit: Payer: Self-pay

## 2015-09-29 DIAGNOSIS — Z95828 Presence of other vascular implants and grafts: Secondary | ICD-10-CM | POA: Insufficient documentation

## 2015-10-01 ENCOUNTER — Other Ambulatory Visit: Payer: Self-pay | Admitting: Oncology

## 2015-10-02 ENCOUNTER — Telehealth: Payer: Self-pay | Admitting: Oncology

## 2015-10-02 ENCOUNTER — Ambulatory Visit (HOSPITAL_BASED_OUTPATIENT_CLINIC_OR_DEPARTMENT_OTHER): Payer: PRIVATE HEALTH INSURANCE

## 2015-10-02 ENCOUNTER — Ambulatory Visit (HOSPITAL_BASED_OUTPATIENT_CLINIC_OR_DEPARTMENT_OTHER): Payer: PRIVATE HEALTH INSURANCE | Admitting: Nurse Practitioner

## 2015-10-02 ENCOUNTER — Encounter: Payer: Self-pay | Admitting: Oncology

## 2015-10-02 ENCOUNTER — Ambulatory Visit: Payer: PRIVATE HEALTH INSURANCE | Admitting: Nutrition

## 2015-10-02 ENCOUNTER — Other Ambulatory Visit (HOSPITAL_BASED_OUTPATIENT_CLINIC_OR_DEPARTMENT_OTHER): Payer: PRIVATE HEALTH INSURANCE

## 2015-10-02 VITALS — BP 135/83 | HR 72 | Temp 98.4°F | Resp 18 | Ht 73.0 in | Wt 169.9 lb

## 2015-10-02 DIAGNOSIS — Z5111 Encounter for antineoplastic chemotherapy: Secondary | ICD-10-CM | POA: Diagnosis not present

## 2015-10-02 DIAGNOSIS — C20 Malignant neoplasm of rectum: Secondary | ICD-10-CM

## 2015-10-02 DIAGNOSIS — C787 Secondary malignant neoplasm of liver and intrahepatic bile duct: Secondary | ICD-10-CM

## 2015-10-02 DIAGNOSIS — Z5112 Encounter for antineoplastic immunotherapy: Secondary | ICD-10-CM | POA: Diagnosis not present

## 2015-10-02 DIAGNOSIS — D649 Anemia, unspecified: Secondary | ICD-10-CM | POA: Diagnosis not present

## 2015-10-02 DIAGNOSIS — Z95828 Presence of other vascular implants and grafts: Secondary | ICD-10-CM

## 2015-10-02 LAB — COMPREHENSIVE METABOLIC PANEL
ALBUMIN: 3.3 g/dL — AB (ref 3.5–5.0)
ALK PHOS: 98 U/L (ref 40–150)
ALT: 12 U/L (ref 0–55)
ANION GAP: 7 meq/L (ref 3–11)
AST: 18 U/L (ref 5–34)
BUN: 10.8 mg/dL (ref 7.0–26.0)
CO2: 25 mEq/L (ref 22–29)
Calcium: 9 mg/dL (ref 8.4–10.4)
Chloride: 107 mEq/L (ref 98–109)
Creatinine: 1.1 mg/dL (ref 0.7–1.3)
EGFR: 82 mL/min/{1.73_m2} — AB (ref >90)
GLUCOSE: 104 mg/dL (ref 70–140)
POTASSIUM: 4.4 meq/L (ref 3.5–5.1)
SODIUM: 140 meq/L (ref 136–145)
TOTAL PROTEIN: 7.1 g/dL (ref 6.4–8.3)

## 2015-10-02 LAB — UA PROTEIN, DIPSTICK - CHCC: Protein, ur: NEGATIVE mg/dL

## 2015-10-02 LAB — CBC WITH DIFFERENTIAL/PLATELET
BASO%: 0.9 % (ref 0.0–2.0)
BASOS ABS: 0.1 10*3/uL (ref 0.0–0.1)
EOS ABS: 0.1 10*3/uL (ref 0.0–0.5)
EOS%: 1.4 % (ref 0.0–7.0)
HCT: 31.7 % — ABNORMAL LOW (ref 38.4–49.9)
HGB: 9.9 g/dL — ABNORMAL LOW (ref 13.0–17.1)
LYMPH%: 25 % (ref 14.0–49.0)
MCH: 26.6 pg — AB (ref 27.2–33.4)
MCHC: 31.2 g/dL — ABNORMAL LOW (ref 32.0–36.0)
MCV: 85.4 fL (ref 79.3–98.0)
MONO#: 0.7 10*3/uL (ref 0.1–0.9)
MONO%: 10.5 % (ref 0.0–14.0)
NEUT#: 4.1 10*3/uL (ref 1.5–6.5)
NEUT%: 62.2 % (ref 39.0–75.0)
Platelets: 218 10*3/uL (ref 140–400)
RBC: 3.71 10*6/uL — AB (ref 4.20–5.82)
RDW: 25.3 % — ABNORMAL HIGH (ref 11.0–14.6)
WBC: 6.7 10*3/uL (ref 4.0–10.3)
lymph#: 1.7 10*3/uL (ref 0.9–3.3)

## 2015-10-02 MED ORDER — OXALIPLATIN CHEMO INJECTION 100 MG/20ML
85.0000 mg/m2 | Freq: Once | INTRAVENOUS | Status: AC
  Administered 2015-10-02: 175 mg via INTRAVENOUS
  Filled 2015-10-02: qty 35

## 2015-10-02 MED ORDER — LEUCOVORIN CALCIUM INJECTION 350 MG
394.0000 mg/m2 | Freq: Once | INTRAVENOUS | Status: AC
  Administered 2015-10-02: 800 mg via INTRAVENOUS
  Filled 2015-10-02: qty 40

## 2015-10-02 MED ORDER — SODIUM CHLORIDE 0.9 % IV SOLN
5.0000 mg/kg | Freq: Once | INTRAVENOUS | Status: AC
  Administered 2015-10-02: 400 mg via INTRAVENOUS
  Filled 2015-10-02: qty 16

## 2015-10-02 MED ORDER — PALONOSETRON HCL INJECTION 0.25 MG/5ML
0.2500 mg | Freq: Once | INTRAVENOUS | Status: AC
  Administered 2015-10-02: 0.25 mg via INTRAVENOUS

## 2015-10-02 MED ORDER — DEXTROSE 5 % IV SOLN
Freq: Once | INTRAVENOUS | Status: AC
  Administered 2015-10-02: 12:00:00 via INTRAVENOUS

## 2015-10-02 MED ORDER — FOSAPREPITANT DIMEGLUMINE INJECTION 150 MG
Freq: Once | INTRAVENOUS | Status: AC
  Administered 2015-10-02: 11:00:00 via INTRAVENOUS
  Filled 2015-10-02: qty 5

## 2015-10-02 MED ORDER — FLUOROURACIL CHEMO INJECTION 2.5 GM/50ML
400.0000 mg/m2 | Freq: Once | INTRAVENOUS | Status: AC
  Administered 2015-10-02: 800 mg via INTRAVENOUS
  Filled 2015-10-02: qty 16

## 2015-10-02 MED ORDER — PALONOSETRON HCL INJECTION 0.25 MG/5ML
INTRAVENOUS | Status: AC
  Filled 2015-10-02: qty 5

## 2015-10-02 MED ORDER — SODIUM CHLORIDE 0.9 % IJ SOLN
10.0000 mL | INTRAMUSCULAR | Status: DC | PRN
  Administered 2015-10-02: 10 mL via INTRAVENOUS
  Filled 2015-10-02: qty 10

## 2015-10-02 MED ORDER — SODIUM CHLORIDE 0.9 % IV SOLN
2455.0000 mg/m2 | INTRAVENOUS | Status: DC
  Administered 2015-10-02: 5000 mg via INTRAVENOUS
  Filled 2015-10-02: qty 100

## 2015-10-02 MED ORDER — SODIUM CHLORIDE 0.9 % IV SOLN
Freq: Once | INTRAVENOUS | Status: AC
  Administered 2015-10-02: 11:00:00 via INTRAVENOUS

## 2015-10-02 NOTE — Progress Notes (Signed)
Nutrition follow-up completed with patient diagnosed with rectal cancer. Appetite and inadequate oral intake has improved. Weight improved documented as 169.9 pounds May 1, increased from 165 pounds April 3. Patient denies nutrition impact symptoms.  Nutrition diagnosis: Unintended weight loss improved.  Intervention:  Provided support and encouragement for patient to continue strategies for adequate calories and protein intake. Encouraged patient to continue to strive for weight maintenance/weight gain. Questions were answered.  Teach back method used.  Monitoring, evaluation, goals: Patient will continue to tolerate adequate calories and protein to promote weight maintenance.  Next visit: Monday, June 5, during infusion.  **Disclaimer: This note was dictated with voice recognition software. Similar sounding words can inadvertently be transcribed and this note may contain transcription errors which may not have been corrected upon publication of note.**

## 2015-10-02 NOTE — Patient Instructions (Signed)

## 2015-10-02 NOTE — Progress Notes (Signed)
Enrolled pt in the Neulasta First Step program.  Faxed signed form and activated card today.  °

## 2015-10-02 NOTE — Patient Instructions (Signed)
[facility] Discharge Instructions for Patients Receiving Chemotherapy  Today you received the following chemotherapy agents Oxaliplatin, Leucovorin, Adrucil, Avastin  To help prevent nausea and vomiting after your treatment, we encourage you to take your nausea medication    If you develop nausea and vomiting that is not controlled by your nausea medication, call the clinic.   BELOW ARE SYMPTOMS THAT SHOULD BE REPORTED IMMEDIATELY:  *FEVER GREATER THAN 100.5 F  *CHILLS WITH OR WITHOUT FEVER  NAUSEA AND VOMITING THAT IS NOT CONTROLLED WITH YOUR NAUSEA MEDICATION  *UNUSUAL SHORTNESS OF BREATH  *UNUSUAL BRUISING OR BLEEDING  TENDERNESS IN MOUTH AND THROAT WITH OR WITHOUT PRESENCE OF ULCERS  *URINARY PROBLEMS  *BOWEL PROBLEMS  UNUSUAL RASH Items with * indicate a potential emergency and should be followed up as soon as possible.  Feel free to call the clinic you have any questions or concerns. The clinic phone number is [phone].  Please show the CHEMO ALERT CARD at check-in to the Emergency Department and triage nurse.   

## 2015-10-02 NOTE — Progress Notes (Signed)
  [facility] Walker NOTE   Diagnosis:  Rectal cancer  INTERVAL HISTORY:   Hunter Walker returns as scheduled. He completed cycle 6 FOLFOX plus Avastin 09/18/2015. He denies nausea/vomiting. No mouth sores. No diarrhea. Cold sensitivity typically lasts for 4 or 5 days. No persistent neuropathy symptoms. No bleeding. No shortness of breath or chest pain. No leg swelling or calf pain. No abdominal pain.  Objective:  Vital signs in last 24 hours:  Blood pressure 135/83, pulse 72, temperature 98.4 F (36.9 C), temperature source Oral, resp. rate 18, height 6\' 1"  (1.854 m), weight 169 lb 14.4 oz (77.066 kg), SpO2 100 %.    HEENT: No thrush or ulcers. Resp: Lungs clear bilaterally. Cardio: Regular rate and rhythm. GI: Abdomen soft and nontender. No hepatomegaly. Vascular: No leg edema. Neuro: Mild decrease in vibratory sense over the fingertips per tuning fork exam.  Skin: Palms with hyperpigmentation.  Port-A-Cath without erythema.  Lab Results:  Lab Results  Component Value Date   WBC 6.7 10/02/2015   HGB 9.9* 10/02/2015   HCT 31.7* 10/02/2015   MCV 85.4 10/02/2015   PLT 218 10/02/2015   NEUTROABS 4.1 10/02/2015    Imaging:  No results found.  Medications: I have reviewed the patient's current medications.  Assessment/Plan: 1. Rectal cancer, invasive adenocarcinoma confirmed at colonoscopy 05/22/2015, EUS staging 06/01/2015 revealed a uT3,N1 lesion at 7 cm from the anal verge  Staging CT abdomen/pelvis 05/15/2015 confirmed a mass at the rectosigmoid colon, a suspicious perirectal lymph node, and a 6 mm right liver lesion felt to most likely be a cyst  CT chest 05/30/2015 with indeterminate pulmonary nodules and multiple liver lesions suspicious for metastases  Ultrasound 06/13/2014 with no liver lesions identified  MRI abdomen 06/21/2015 consistent with multiple liver metastases  CT biopsy of liver lesion 06/26/2015. Pathology showed  metastatic adenocarcinoma consistent with colonic primary.  Cycle 1 FOLFOX 07/10/2015  Cycle 2 FOLFOX plus Avastin 07/24/2015 with Neulasta support  Cycle 3 FOLFOX plus Avastin 08/07/2015  Cycle 4 FOLFOX plus Avastin 08/21/2015  cycle 5 FOLFOX plus Avastin 09/04/2015  Restaging CTs 09/15/2015 revealed a decrease in the size of liver lesions, decreased rectal primary, stable/decreased size of tiny lung nodules  Cycle 6 FOLFOX plus Avastin 09/18/2015  Cycle 7 FOLFOX plus Avastin 10/02/2015 2. Multiple colon polyps on the colonoscopy 05/22/2015, tubular villous adenoma and a tubular adenoma were removed 3. Anemia-likely secondary to rectal bleeding.  4. Family history of multiple cancers including breast and prostate cancer 5. Transient right arm numbness 06/20/2015 6. Port-A-Cath placement 07/06/2015 by Dr. Marcello Moores 7. Mild neutropenia secondary to chemotherapy following cycle 1 FOLFOX. Neulasta added with cycle 2. 8. Delayed nausea following chemotherapy-emend added with cycle 3 FOLFOX.   Disposition: Hunter Walker appears stable. He has completed 6 cycles of FOLFOX plus Avastin. Plan to proceed with cycle 7 today as scheduled. He will return for a follow-up visit and cycle 8 in 2 weeks. He will contact the office in the interim with any problems.    Ned Card ANP/GNP-BC   10/02/2015  10:18 AM

## 2015-10-02 NOTE — Telephone Encounter (Signed)
Gave and printed appt sched and avs for pt for May and June  °

## 2015-10-04 ENCOUNTER — Ambulatory Visit (HOSPITAL_BASED_OUTPATIENT_CLINIC_OR_DEPARTMENT_OTHER): Payer: PRIVATE HEALTH INSURANCE

## 2015-10-04 DIAGNOSIS — C20 Malignant neoplasm of rectum: Secondary | ICD-10-CM | POA: Diagnosis not present

## 2015-10-04 DIAGNOSIS — Z5189 Encounter for other specified aftercare: Secondary | ICD-10-CM

## 2015-10-04 MED ORDER — HEPARIN SOD (PORK) LOCK FLUSH 100 UNIT/ML IV SOLN
500.0000 [IU] | Freq: Once | INTRAVENOUS | Status: AC | PRN
Start: 2015-10-04 — End: 2015-10-04
  Administered 2015-10-04: 500 [IU]
  Filled 2015-10-04: qty 5

## 2015-10-04 MED ORDER — SODIUM CHLORIDE 0.9% FLUSH
10.0000 mL | INTRAVENOUS | Status: DC | PRN
  Administered 2015-10-04: 10 mL
  Filled 2015-10-04: qty 10

## 2015-10-04 MED ORDER — PEGFILGRASTIM INJECTION 6 MG/0.6ML ~~LOC~~
6.0000 mg | PREFILLED_SYRINGE | Freq: Once | SUBCUTANEOUS | Status: AC
  Administered 2015-10-04: 6 mg via SUBCUTANEOUS
  Filled 2015-10-04: qty 0.6

## 2015-10-15 ENCOUNTER — Other Ambulatory Visit: Payer: Self-pay | Admitting: Oncology

## 2015-10-16 ENCOUNTER — Ambulatory Visit (HOSPITAL_BASED_OUTPATIENT_CLINIC_OR_DEPARTMENT_OTHER): Payer: PRIVATE HEALTH INSURANCE

## 2015-10-16 ENCOUNTER — Other Ambulatory Visit (HOSPITAL_BASED_OUTPATIENT_CLINIC_OR_DEPARTMENT_OTHER): Payer: PRIVATE HEALTH INSURANCE

## 2015-10-16 ENCOUNTER — Telehealth: Payer: Self-pay | Admitting: Oncology

## 2015-10-16 ENCOUNTER — Ambulatory Visit (HOSPITAL_BASED_OUTPATIENT_CLINIC_OR_DEPARTMENT_OTHER): Payer: PRIVATE HEALTH INSURANCE | Admitting: Nurse Practitioner

## 2015-10-16 VITALS — BP 124/79 | HR 78 | Temp 98.4°F | Resp 18 | Ht 73.0 in | Wt 167.8 lb

## 2015-10-16 VITALS — BP 102/65 | HR 71 | Resp 18

## 2015-10-16 DIAGNOSIS — C20 Malignant neoplasm of rectum: Secondary | ICD-10-CM

## 2015-10-16 DIAGNOSIS — Z95828 Presence of other vascular implants and grafts: Secondary | ICD-10-CM

## 2015-10-16 DIAGNOSIS — C787 Secondary malignant neoplasm of liver and intrahepatic bile duct: Secondary | ICD-10-CM

## 2015-10-16 DIAGNOSIS — Z5112 Encounter for antineoplastic immunotherapy: Secondary | ICD-10-CM

## 2015-10-16 DIAGNOSIS — D649 Anemia, unspecified: Secondary | ICD-10-CM

## 2015-10-16 DIAGNOSIS — Z5111 Encounter for antineoplastic chemotherapy: Secondary | ICD-10-CM

## 2015-10-16 LAB — CBC WITH DIFFERENTIAL/PLATELET
BASO%: 0.4 % (ref 0.0–2.0)
Basophils Absolute: 0 10*3/uL (ref 0.0–0.1)
EOS ABS: 0.1 10*3/uL (ref 0.0–0.5)
EOS%: 0.9 % (ref 0.0–7.0)
HCT: 33.3 % — ABNORMAL LOW (ref 38.4–49.9)
HEMOGLOBIN: 10.4 g/dL — AB (ref 13.0–17.1)
LYMPH%: 23.1 % (ref 14.0–49.0)
MCH: 27.6 pg (ref 27.2–33.4)
MCHC: 31.2 g/dL — ABNORMAL LOW (ref 32.0–36.0)
MCV: 88.3 fL (ref 79.3–98.0)
MONO#: 0.9 10*3/uL (ref 0.1–0.9)
MONO%: 12.7 % (ref 0.0–14.0)
NEUT%: 62.9 % (ref 39.0–75.0)
NEUTROS ABS: 4.7 10*3/uL (ref 1.5–6.5)
Platelets: 191 10*3/uL (ref 140–400)
RBC: 3.77 10*6/uL — AB (ref 4.20–5.82)
RDW: 23.5 % — AB (ref 11.0–14.6)
WBC: 7.4 10*3/uL (ref 4.0–10.3)
lymph#: 1.7 10*3/uL (ref 0.9–3.3)

## 2015-10-16 LAB — COMPREHENSIVE METABOLIC PANEL
ALT: 14 U/L (ref 0–55)
AST: 18 U/L (ref 5–34)
Albumin: 3.4 g/dL — ABNORMAL LOW (ref 3.5–5.0)
Alkaline Phosphatase: 105 U/L (ref 40–150)
Anion Gap: 7 mEq/L (ref 3–11)
BUN: 7.6 mg/dL (ref 7.0–26.0)
CO2: 27 mEq/L (ref 22–29)
Calcium: 9.2 mg/dL (ref 8.4–10.4)
Chloride: 107 mEq/L (ref 98–109)
Creatinine: 1.1 mg/dL (ref 0.7–1.3)
EGFR: 84 mL/min/{1.73_m2} — ABNORMAL LOW (ref >90)
Glucose: 99 mg/dl (ref 70–140)
Potassium: 4.2 mEq/L (ref 3.5–5.1)
Sodium: 140 mEq/L (ref 136–145)
Total Bilirubin: <0.3 mg/dL (ref 0.20–1.20)
Total Protein: 7.4 g/dL (ref 6.4–8.3)

## 2015-10-16 LAB — UA PROTEIN, DIPSTICK - CHCC: Protein, ur: NEGATIVE mg/dL

## 2015-10-16 MED ORDER — FLUOROURACIL CHEMO INJECTION 2.5 GM/50ML
400.0000 mg/m2 | Freq: Once | INTRAVENOUS | Status: AC
  Administered 2015-10-16: 800 mg via INTRAVENOUS
  Filled 2015-10-16: qty 16

## 2015-10-16 MED ORDER — BEVACIZUMAB CHEMO INJECTION 400 MG/16ML
5.0000 mg/kg | Freq: Once | INTRAVENOUS | Status: AC
  Administered 2015-10-16: 400 mg via INTRAVENOUS
  Filled 2015-10-16: qty 16

## 2015-10-16 MED ORDER — PALONOSETRON HCL INJECTION 0.25 MG/5ML
INTRAVENOUS | Status: AC
  Filled 2015-10-16: qty 5

## 2015-10-16 MED ORDER — LEUCOVORIN CALCIUM INJECTION 350 MG
394.0000 mg/m2 | Freq: Once | INTRAVENOUS | Status: AC
  Administered 2015-10-16: 800 mg via INTRAVENOUS
  Filled 2015-10-16: qty 40

## 2015-10-16 MED ORDER — PALONOSETRON HCL INJECTION 0.25 MG/5ML
0.2500 mg | Freq: Once | INTRAVENOUS | Status: AC
  Administered 2015-10-16: 0.25 mg via INTRAVENOUS

## 2015-10-16 MED ORDER — DEXTROSE 5 % IV SOLN
Freq: Once | INTRAVENOUS | Status: AC
  Administered 2015-10-16: 13:00:00 via INTRAVENOUS

## 2015-10-16 MED ORDER — SODIUM CHLORIDE 0.9 % IV SOLN
Freq: Once | INTRAVENOUS | Status: AC
  Administered 2015-10-16: 11:00:00 via INTRAVENOUS

## 2015-10-16 MED ORDER — SODIUM CHLORIDE 0.9 % IV SOLN
Freq: Once | INTRAVENOUS | Status: AC
  Administered 2015-10-16: 12:00:00 via INTRAVENOUS
  Filled 2015-10-16: qty 5

## 2015-10-16 MED ORDER — OXALIPLATIN CHEMO INJECTION 100 MG/20ML
85.0000 mg/m2 | Freq: Once | INTRAVENOUS | Status: AC
  Administered 2015-10-16: 175 mg via INTRAVENOUS
  Filled 2015-10-16: qty 35

## 2015-10-16 MED ORDER — SODIUM CHLORIDE 0.9 % IV SOLN
2455.0000 mg/m2 | INTRAVENOUS | Status: DC
  Administered 2015-10-16: 5000 mg via INTRAVENOUS
  Filled 2015-10-16: qty 100

## 2015-10-16 MED ORDER — SODIUM CHLORIDE 0.9 % IJ SOLN
10.0000 mL | INTRAMUSCULAR | Status: DC | PRN
  Administered 2015-10-16: 10 mL via INTRAVENOUS
  Filled 2015-10-16: qty 10

## 2015-10-16 NOTE — Telephone Encounter (Signed)
Gave pt apt & avs °

## 2015-10-16 NOTE — Patient Instructions (Signed)

## 2015-10-16 NOTE — Progress Notes (Signed)
  [facility] Walker OFFICE PROGRESS NOTE   Diagnosis:  Rectal cancer  INTERVAL HISTORY:   [name] returns as scheduled. He completed cycle 7 FOLFOX plus Avastin 10/02/2015. He denies nausea/vomiting. No mouth sores. No diarrhea. Cold sensitivity lasted about 3 days. No persistent neuropathy symptoms. He denies any bleeding. No shortness of breath or chest pain. No leg swelling or calf pain. No rectal pain.  Objective:  Vital signs in last 24 hours:  Blood pressure 124/79, pulse 78, temperature 98.4 F (36.9 C), temperature source Oral, resp. rate 18, height 6\' 1"  (1.854 m), weight 167 lb 12.8 oz (76.114 kg), SpO2 100 %.    HEENT: No thrush or ulcerations. Resp: Lungs clear bilaterally. Cardio: Regular rate and rhythm. GI: Abdomen soft and nontender. No hepatomegaly. Vascular: No leg edema. Neuro: Mild decrease in vibratory sense over the fingertips per tuning fork exam.  Port-A-Cath without erythema.    Lab Results:  Lab Results  Component Value Date   WBC 7.4 10/16/2015   HGB 10.4* 10/16/2015   HCT 33.3* 10/16/2015   MCV 88.3 10/16/2015   PLT 191 10/16/2015   NEUTROABS 4.7 10/16/2015    Imaging:  No results found.  Medications: I have reviewed the patient's current medications.  Assessment/Plan: 1. Rectal cancer, invasive adenocarcinoma confirmed at colonoscopy 05/22/2015, EUS staging 06/01/2015 revealed a uT3,N1 lesion at 7 cm from the anal verge  Staging CT abdomen/pelvis 05/15/2015 confirmed a mass at the rectosigmoid colon, a suspicious perirectal lymph node, and a 6 mm right liver lesion felt to most likely be a cyst  CT chest 05/30/2015 with indeterminate pulmonary nodules and multiple liver lesions suspicious for metastases  Ultrasound 06/13/2014 with no liver lesions identified  MRI abdomen 06/21/2015 consistent with multiple liver metastases  CT biopsy of liver lesion 06/26/2015. Pathology showed metastatic adenocarcinoma consistent with  colonic primary.  Cycle 1 FOLFOX 07/10/2015  Cycle 2 FOLFOX plus Avastin 07/24/2015 with Neulasta support  Cycle 3 FOLFOX plus Avastin 08/07/2015  Cycle 4 FOLFOX plus Avastin 08/21/2015  cycle 5 FOLFOX plus Avastin 09/04/2015  Restaging CTs 09/15/2015 revealed a decrease in the size of liver lesions, decreased rectal primary, stable/decreased size of tiny lung nodules  Cycle 6 FOLFOX plus Avastin 09/18/2015  Cycle 7 FOLFOX plus Avastin 10/02/2015  Cycle 8 FOLFOX plus Avastin 10/16/2015 2. Multiple colon polyps on the colonoscopy 05/22/2015, tubular villous adenoma and a tubular adenoma were removed 3. Anemia-likely secondary to rectal bleeding.  4. Family history of multiple cancers including breast and prostate cancer 5. Transient right arm numbness 06/20/2015 6. Port-A-Cath placement 07/06/2015 by Dr. Marcello Moores 7. Mild neutropenia secondary to chemotherapy following cycle 1 FOLFOX. Neulasta added with cycle 2. 8. Delayed nausea following chemotherapy-emend added with cycle 3 FOLFOX   Disposition: Hunter Walker appears stable. He has completed 7 cycles of FOLFOX plus Avastin. Plan to proceed with cycle 8 today as scheduled. He will return for a follow-up visit and cycle 9 in 3 weeks. He will contact the office in the interim with any problems.    Ned Card ANP/GNP-BC   10/16/2015  10:43 AM

## 2015-10-16 NOTE — Patient Instructions (Signed)
[facility] Discharge Instructions for Patients Receiving Chemotherapy  Today you received the following chemotherapy agents Oxaliplatin, Leucovorin, Adrucil, Avastin  To help prevent nausea and vomiting after your treatment, we encourage you to take your nausea medication    If you develop nausea and vomiting that is not controlled by your nausea medication, call the clinic.   BELOW ARE SYMPTOMS THAT SHOULD BE REPORTED IMMEDIATELY:  *FEVER GREATER THAN 100.5 F  *CHILLS WITH OR WITHOUT FEVER  NAUSEA AND VOMITING THAT IS NOT CONTROLLED WITH YOUR NAUSEA MEDICATION  *UNUSUAL SHORTNESS OF BREATH  *UNUSUAL BRUISING OR BLEEDING  TENDERNESS IN MOUTH AND THROAT WITH OR WITHOUT PRESENCE OF ULCERS  *URINARY PROBLEMS  *BOWEL PROBLEMS  UNUSUAL RASH Items with * indicate a potential emergency and should be followed up as soon as possible.  Feel free to call the clinic you have any questions or concerns. The clinic phone number is [phone].  Please show the CHEMO ALERT CARD at check-in to the Emergency Department and triage nurse.   

## 2015-10-17 LAB — CEA: CEA: 7.1 ng/mL — ABNORMAL HIGH (ref 0.0–4.7)

## 2015-10-18 ENCOUNTER — Ambulatory Visit: Payer: PRIVATE HEALTH INSURANCE

## 2015-10-18 ENCOUNTER — Ambulatory Visit (HOSPITAL_BASED_OUTPATIENT_CLINIC_OR_DEPARTMENT_OTHER): Payer: PRIVATE HEALTH INSURANCE

## 2015-10-18 DIAGNOSIS — Z5189 Encounter for other specified aftercare: Secondary | ICD-10-CM | POA: Diagnosis not present

## 2015-10-18 DIAGNOSIS — C20 Malignant neoplasm of rectum: Secondary | ICD-10-CM | POA: Diagnosis not present

## 2015-10-18 MED ORDER — SODIUM CHLORIDE 0.9% FLUSH
10.0000 mL | INTRAVENOUS | Status: DC | PRN
  Administered 2015-10-18: 10 mL
  Filled 2015-10-18: qty 10

## 2015-10-18 MED ORDER — HEPARIN SOD (PORK) LOCK FLUSH 100 UNIT/ML IV SOLN
500.0000 [IU] | Freq: Once | INTRAVENOUS | Status: AC | PRN
  Administered 2015-10-18: 500 [IU]
  Filled 2015-10-18: qty 5

## 2015-10-18 MED ORDER — PEGFILGRASTIM INJECTION 6 MG/0.6ML ~~LOC~~
6.0000 mg | PREFILLED_SYRINGE | Freq: Once | SUBCUTANEOUS | Status: AC
  Administered 2015-10-18: 6 mg via SUBCUTANEOUS
  Filled 2015-10-18: qty 0.6

## 2015-10-18 NOTE — Progress Notes (Signed)
Injection given in flush

## 2015-10-25 ENCOUNTER — Other Ambulatory Visit: Payer: Self-pay | Admitting: Internal Medicine

## 2015-10-30 ENCOUNTER — Other Ambulatory Visit: Payer: Self-pay | Admitting: Oncology

## 2015-11-06 ENCOUNTER — Telehealth: Payer: Self-pay | Admitting: Oncology

## 2015-11-06 ENCOUNTER — Ambulatory Visit: Payer: PRIVATE HEALTH INSURANCE | Admitting: Nutrition

## 2015-11-06 ENCOUNTER — Ambulatory Visit (HOSPITAL_BASED_OUTPATIENT_CLINIC_OR_DEPARTMENT_OTHER): Payer: PRIVATE HEALTH INSURANCE | Admitting: Oncology

## 2015-11-06 ENCOUNTER — Encounter: Payer: Self-pay | Admitting: *Deleted

## 2015-11-06 ENCOUNTER — Ambulatory Visit (HOSPITAL_BASED_OUTPATIENT_CLINIC_OR_DEPARTMENT_OTHER): Payer: PRIVATE HEALTH INSURANCE

## 2015-11-06 ENCOUNTER — Other Ambulatory Visit (HOSPITAL_BASED_OUTPATIENT_CLINIC_OR_DEPARTMENT_OTHER): Payer: PRIVATE HEALTH INSURANCE

## 2015-11-06 VITALS — BP 136/80 | HR 76 | Temp 98.3°F | Resp 18 | Ht 73.0 in | Wt 168.3 lb

## 2015-11-06 VITALS — BP 104/67 | HR 67 | Resp 16

## 2015-11-06 DIAGNOSIS — G620 Drug-induced polyneuropathy: Secondary | ICD-10-CM | POA: Diagnosis not present

## 2015-11-06 DIAGNOSIS — Z5111 Encounter for antineoplastic chemotherapy: Secondary | ICD-10-CM | POA: Diagnosis not present

## 2015-11-06 DIAGNOSIS — C20 Malignant neoplasm of rectum: Secondary | ICD-10-CM

## 2015-11-06 DIAGNOSIS — C787 Secondary malignant neoplasm of liver and intrahepatic bile duct: Secondary | ICD-10-CM

## 2015-11-06 DIAGNOSIS — Z5112 Encounter for antineoplastic immunotherapy: Secondary | ICD-10-CM | POA: Diagnosis not present

## 2015-11-06 DIAGNOSIS — D630 Anemia in neoplastic disease: Secondary | ICD-10-CM

## 2015-11-06 LAB — COMPREHENSIVE METABOLIC PANEL
ALT: 10 U/L (ref 0–55)
AST: 16 U/L (ref 5–34)
Albumin: 3.2 g/dL — ABNORMAL LOW (ref 3.5–5.0)
Alkaline Phosphatase: 81 U/L (ref 40–150)
Anion Gap: 7 mEq/L (ref 3–11)
BUN: 9.9 mg/dL (ref 7.0–26.0)
CALCIUM: 9.1 mg/dL (ref 8.4–10.4)
CHLORIDE: 106 meq/L (ref 98–109)
CO2: 26 meq/L (ref 22–29)
CREATININE: 1.1 mg/dL (ref 0.7–1.3)
EGFR: 80 mL/min/{1.73_m2} — ABNORMAL LOW (ref >90)
GLUCOSE: 87 mg/dL (ref 70–140)
POTASSIUM: 4.6 meq/L (ref 3.5–5.1)
SODIUM: 139 meq/L (ref 136–145)
Total Bilirubin: <0.3 mg/dL (ref 0.20–1.20)
Total Protein: 7.5 g/dL (ref 6.4–8.3)

## 2015-11-06 LAB — CBC WITH DIFFERENTIAL/PLATELET
BASO%: 0.3 % (ref 0.0–2.0)
Basophils Absolute: 0 10*3/uL (ref 0.0–0.1)
EOS%: 1.1 % (ref 0.0–7.0)
Eosinophils Absolute: 0.1 10*3/uL (ref 0.0–0.5)
HEMATOCRIT: 32.5 % — AB (ref 38.4–49.9)
HGB: 10.5 g/dL — ABNORMAL LOW (ref 13.0–17.1)
LYMPH#: 1.4 10*3/uL (ref 0.9–3.3)
LYMPH%: 17 % (ref 14.0–49.0)
MCH: 29 pg (ref 27.2–33.4)
MCHC: 32.2 g/dL (ref 32.0–36.0)
MCV: 89.8 fL (ref 79.3–98.0)
MONO#: 0.9 10*3/uL (ref 0.1–0.9)
MONO%: 10.8 % (ref 0.0–14.0)
NEUT#: 5.7 10*3/uL (ref 1.5–6.5)
NEUT%: 70.8 % (ref 39.0–75.0)
Platelets: 225 10*3/uL (ref 140–400)
RBC: 3.61 10*6/uL — AB (ref 4.20–5.82)
RDW: 24.6 % — AB (ref 11.0–14.6)
WBC: 8.1 10*3/uL (ref 4.0–10.3)

## 2015-11-06 LAB — UA PROTEIN, DIPSTICK - CHCC: Protein, ur: NEGATIVE mg/dL

## 2015-11-06 MED ORDER — DEXTROSE 5 % IV SOLN
85.0000 mg/m2 | Freq: Once | INTRAVENOUS | Status: AC
  Administered 2015-11-06: 175 mg via INTRAVENOUS
  Filled 2015-11-06: qty 35

## 2015-11-06 MED ORDER — PALONOSETRON HCL INJECTION 0.25 MG/5ML
INTRAVENOUS | Status: AC
  Filled 2015-11-06: qty 5

## 2015-11-06 MED ORDER — FLUOROURACIL CHEMO INJECTION 2.5 GM/50ML
400.0000 mg/m2 | Freq: Once | INTRAVENOUS | Status: AC
  Administered 2015-11-06: 800 mg via INTRAVENOUS
  Filled 2015-11-06: qty 16

## 2015-11-06 MED ORDER — PALONOSETRON HCL INJECTION 0.25 MG/5ML
0.2500 mg | Freq: Once | INTRAVENOUS | Status: AC
  Administered 2015-11-06: 0.25 mg via INTRAVENOUS

## 2015-11-06 MED ORDER — DEXTROSE 5 % IV SOLN
Freq: Once | INTRAVENOUS | Status: AC
  Administered 2015-11-06: 11:00:00 via INTRAVENOUS

## 2015-11-06 MED ORDER — SODIUM CHLORIDE 0.9 % IV SOLN
2455.0000 mg/m2 | INTRAVENOUS | Status: DC
  Administered 2015-11-06: 5000 mg via INTRAVENOUS
  Filled 2015-11-06: qty 100

## 2015-11-06 MED ORDER — SODIUM CHLORIDE 0.9 % IV SOLN
Freq: Once | INTRAVENOUS | Status: AC
  Administered 2015-11-06: 10:00:00 via INTRAVENOUS
  Filled 2015-11-06: qty 5

## 2015-11-06 MED ORDER — LEUCOVORIN CALCIUM INJECTION 350 MG
394.0000 mg/m2 | Freq: Once | INTRAVENOUS | Status: AC
  Administered 2015-11-06: 800 mg via INTRAVENOUS
  Filled 2015-11-06: qty 40

## 2015-11-06 MED ORDER — SODIUM CHLORIDE 0.9 % IV SOLN
Freq: Once | INTRAVENOUS | Status: AC
  Administered 2015-11-06: 10:00:00 via INTRAVENOUS

## 2015-11-06 MED ORDER — SODIUM CHLORIDE 0.9 % IV SOLN
5.0000 mg/kg | Freq: Once | INTRAVENOUS | Status: AC
  Administered 2015-11-06: 400 mg via INTRAVENOUS
  Filled 2015-11-06: qty 16

## 2015-11-06 NOTE — Progress Notes (Signed)
Nutrition follow-up completed with patient diagnosed with rectal cancer. Patient reports good appetite and increased oral intake. Weight is stable and documented as 168.3 pounds on June 5. Patient denies nutrition impact He has not been drinking oral nutrition supplements but does enjoy milkshakes. He tries to eat plenty of protein at mealtimes.  Nutrition diagnosis: Unintended weight loss improved.  Intervention: Patient educated to continue strategies for adequate calories and protein to promote weight maintenance. Teach back method used.  Monitoring, evaluation, goals: Patient will continue to tolerate adequate calories and protein to promote weight maintenance.  Next visit: Monday, July 10, during infusion.  **Disclaimer: This note was dictated with voice recognition software. Similar sounding words can inadvertently be transcribed and this note may contain transcription errors which may not have been corrected upon publication of note.**

## 2015-11-06 NOTE — Progress Notes (Signed)
  [facility] OFFICE PROGRESS NOTE   Diagnosis: Colon cancer  INTERVAL HISTORY:   Hunter Walker returns as scheduled. He computed another cycle of FOLFOX/Avastin 10/16/2015. He tolerated chemotherapy well. No nausea/vomiting, diarrhea, or neuropathy symptoms. No bleeding or symptom of thrombosis. No complaint.  Objective:  Vital signs in last 24 hours:  Blood pressure 136/80, pulse 76, temperature 98.3 F (36.8 C), temperature source Oral, resp. rate 18, height 6\' 1"  (1.854 m), weight 168 lb 4.8 oz (76.34 kg), SpO2 100 %.    HEENT: No thrush or ulcers Resp: Lungs clear bilaterally Cardio: Regular rate and rhythm GI: No hepatomegaly, nontender Vascular: No leg edema Neuro: Mild decrease in vibratory sense at the fingers bilaterally  Skin: Hyperpigmentation of the hands   Portacath/PICC-without erythema  Lab Results:  Lab Results  Component Value Date   WBC 8.1 11/06/2015   HGB 10.5* 11/06/2015   HCT 32.5* 11/06/2015   MCV 89.8 11/06/2015   PLT 225 11/06/2015   NEUTROABS 5.7 11/06/2015      Lab Results  Component Value Date   CEA1 7.1* 10/16/2015    Medications: I have reviewed the patient's current medications.  Assessment/Plan: 1. Rectal cancer, invasive adenocarcinoma confirmed at colonoscopy 05/22/2015, EUS staging 06/01/2015 revealed a uT3,N1 lesion at 7 cm from the anal verge  Staging CT abdomen/pelvis 05/15/2015 confirmed a mass at the rectosigmoid colon, a suspicious perirectal lymph node, and a 6 mm right liver lesion felt to most likely be a cyst  CT chest 05/30/2015 with indeterminate pulmonary nodules and multiple liver lesions suspicious for metastases  Ultrasound 06/13/2014 with no liver lesions identified  MRI abdomen 06/21/2015 consistent with multiple liver metastases  CT biopsy of liver lesion 06/26/2015. Pathology showed metastatic adenocarcinoma consistent with colonic primary.  Cycle 1 FOLFOX 07/10/2015  Cycle 2 FOLFOX  plus Avastin 07/24/2015 with Neulasta support  Cycle 3 FOLFOX plus Avastin 08/07/2015  Cycle 4 FOLFOX plus Avastin 08/21/2015  cycle 5 FOLFOX plus Avastin 09/04/2015  Restaging CTs 09/15/2015 revealed a decrease in the size of liver lesions, decreased rectal primary, stable/decreased size of tiny lung nodules  Cycle 6 FOLFOX plus Avastin 09/18/2015  Cycle 7 FOLFOX plus Avastin 10/02/2015  Cycle 8 FOLFOX plus Avastin 10/16/2015  Cycle 9 FOLFOX plus Avastin 11/06/2015 2. Multiple colon polyps on the colonoscopy 05/22/2015, tubular villous adenoma and a tubular adenoma were removed 3. Anemia-likely secondary to rectal bleeding.  4. Family history of multiple cancers including breast and prostate cancer 5. Transient right arm numbness 06/20/2015 6. Port-A-Cath placement 07/06/2015 by Dr. Marcello Moores 7. Mild neutropenia secondary to chemotherapy following cycle 1 FOLFOX. Neulasta added with cycle 2. 8. Delayed nausea following chemotherapy-emend added with cycle 3 FOLFOX 9. Early oxaliplatin neuropathy   Disposition:  Mr. Dobos appears stable. He will complete cycle 9 FOLFOX today. He will return for an office visit and chemotherapy in 2 weeks. He plans to schedule a restaging CT evaluation after cycle 10.  We will monitor him for progressive oxaliplatin neuropathy.  Betsy Coder, MD  11/06/2015  9:32 AM

## 2015-11-06 NOTE — Telephone Encounter (Signed)
Gave pt apt & avs °

## 2015-11-06 NOTE — Patient Instructions (Signed)
[facility] Discharge Instructions for Patients Receiving Chemotherapy  Today you received the following chemotherapy agents: Avastin, Oxaliplatin, Leucovorin, 5FU  To help prevent nausea and vomiting after your treatment, we encourage you to take your nausea medication: Compazine 10 mg every 6 hours as needed.   If you develop nausea and vomiting that is not controlled by your nausea medication, call the clinic.   BELOW ARE SYMPTOMS THAT SHOULD BE REPORTED IMMEDIATELY:  *FEVER GREATER THAN 100.5 F  *CHILLS WITH OR WITHOUT FEVER  NAUSEA AND VOMITING THAT IS NOT CONTROLLED WITH YOUR NAUSEA MEDICATION  *UNUSUAL SHORTNESS OF BREATH  *UNUSUAL BRUISING OR BLEEDING  TENDERNESS IN MOUTH AND THROAT WITH OR WITHOUT PRESENCE OF ULCERS  *URINARY PROBLEMS  *BOWEL PROBLEMS  UNUSUAL RASH Items with * indicate a potential emergency and should be followed up as soon as possible.  Feel free to call the clinic you have any questions or concerns. The clinic phone number is [phone].  Please show the [place] at check-in to the Emergency Department and triage nurse.

## 2015-11-06 NOTE — Progress Notes (Signed)
Oncology Nurse Navigator Documentation  Oncology Nurse Navigator Flowsheets 11/06/2015  Navigator Location [facility]-Med Onc  Navigator Encounter Type Follow-up Appt;Treatment  Abnormal Finding Date -  Confirmed Diagnosis Date -  Treatment Initiated Date -  Patient Visit Type MedOnc  Treatment Phase Active Tx--FOLFOX/Avastin-cycle #9  Barriers/Navigation Needs Coordination of Care  Interventions Coordination of Care--instructed him to check his schedule and if the flush has not been added to his lab visit to call navigator and it will get corrected.  Coordination of Care Appts-confirmed that his next two appointments have the lab w/flush  Support Groups/Services -  [facility] -  Time Spent with Patient 15  No other navigation needs. He is feeling well and tolerating treatment well. Will have CT scan after 10 th cycle

## 2015-11-06 NOTE — Telephone Encounter (Signed)
Pt will get new sched in tx room °

## 2015-11-08 ENCOUNTER — Ambulatory Visit (HOSPITAL_BASED_OUTPATIENT_CLINIC_OR_DEPARTMENT_OTHER): Payer: PRIVATE HEALTH INSURANCE

## 2015-11-08 ENCOUNTER — Ambulatory Visit: Payer: PRIVATE HEALTH INSURANCE

## 2015-11-08 VITALS — BP 136/98 | HR 56 | Temp 98.4°F | Resp 18

## 2015-11-08 DIAGNOSIS — C20 Malignant neoplasm of rectum: Secondary | ICD-10-CM

## 2015-11-08 DIAGNOSIS — Z5189 Encounter for other specified aftercare: Secondary | ICD-10-CM

## 2015-11-08 MED ORDER — HEPARIN SOD (PORK) LOCK FLUSH 100 UNIT/ML IV SOLN
500.0000 [IU] | Freq: Once | INTRAVENOUS | Status: AC | PRN
  Administered 2015-11-08: 500 [IU]
  Filled 2015-11-08: qty 5

## 2015-11-08 MED ORDER — SODIUM CHLORIDE 0.9% FLUSH
10.0000 mL | INTRAVENOUS | Status: DC | PRN
  Administered 2015-11-08: 10 mL
  Filled 2015-11-08: qty 10

## 2015-11-08 MED ORDER — PEGFILGRASTIM INJECTION 6 MG/0.6ML ~~LOC~~
6.0000 mg | PREFILLED_SYRINGE | Freq: Once | SUBCUTANEOUS | Status: AC
  Administered 2015-11-08: 6 mg via SUBCUTANEOUS
  Filled 2015-11-08: qty 0.6

## 2015-11-08 NOTE — Patient Instructions (Signed)
Pegfilgrastim injection What is this medicine? PEGFILGRASTIM (PEG fil gra stim) is a long-acting granulocyte colony-stimulating factor that stimulates the growth of neutrophils, a type of white blood cell important in the body's fight against infection. It is used to reduce the incidence of fever and infection in patients with certain types of cancer who are receiving chemotherapy that affects the bone marrow, and to increase survival after being exposed to high doses of radiation. This medicine may be used for other purposes; ask your health care provider or pharmacist if you have questions. What should I tell my health care provider before I take this medicine? They need to know if you have any of these conditions: -kidney disease -latex allergy -ongoing radiation therapy -sickle cell disease -skin reactions to acrylic adhesives (On-Body Injector only) -an unusual or allergic reaction to pegfilgrastim, filgrastim, other medicines, foods, dyes, or preservatives -pregnant or trying to get pregnant -breast-feeding How should I use this medicine? This medicine is for injection under the skin. If you get this medicine at home, you will be taught how to prepare and give the pre-filled syringe or how to use the On-body Injector. Refer to the patient Instructions for Use for detailed instructions. Use exactly as directed. Take your medicine at regular intervals. Do not take your medicine more often than directed. It is important that you put your used needles and syringes in a special sharps container. Do not put them in a trash can. If you do not have a sharps container, call your pharmacist or healthcare provider to get one. Talk to your pediatrician regarding the use of this medicine in children. While this drug may be prescribed for selected conditions, precautions do apply. Overdosage: If you think you have taken too much of this medicine contact a poison control center or emergency room at  once. NOTE: This medicine is only for you. Do not share this medicine with others. What if I miss a dose? It is important not to miss your dose. Call your doctor or health care professional if you miss your dose. If you miss a dose due to an On-body Injector failure or leakage, a new dose should be administered as soon as possible using a single prefilled syringe for manual use. What may interact with this medicine? Interactions have not been studied. Give your health care provider a list of all the medicines, herbs, non-prescription drugs, or dietary supplements you use. Also tell them if you smoke, drink alcohol, or use illegal drugs. Some items may interact with your medicine. This list may not describe all possible interactions. Give your health care provider a list of all the medicines, herbs, non-prescription drugs, or dietary supplements you use. Also tell them if you smoke, drink alcohol, or use illegal drugs. Some items may interact with your medicine. What should I watch for while using this medicine? You may need blood work done while you are taking this medicine. If you are going to need a MRI, CT scan, or other procedure, tell your doctor that you are using this medicine (On-Body Injector only). What side effects may I notice from receiving this medicine? Side effects that you should report to your doctor or health care professional as soon as possible: -allergic reactions like skin rash, itching or hives, swelling of the face, lips, or tongue -dizziness -fever -pain, redness, or irritation at site where injected -pinpoint red spots on the skin -red or dark-brown urine -shortness of breath or breathing problems -stomach or side pain, or pain   at the shoulder -swelling -tiredness -trouble passing urine or change in the amount of urine Side effects that usually do not require medical attention (report to your doctor or health care professional if they continue or are  bothersome): -bone pain -muscle pain This list may not describe all possible side effects. Call your doctor for medical advice about side effects. You may report side effects to FDA at 1-800-FDA-1088. Where should I keep my medicine? Keep out of the reach of children. Store pre-filled syringes in a refrigerator between 2 and 8 degrees C (36 and 46 degrees F). Do not freeze. Keep in carton to protect from light. Throw away this medicine if it is left out of the refrigerator for more than 48 hours. Throw away any unused medicine after the expiration date. NOTE: This sheet is a summary. It may not cover all possible information. If you have questions about this medicine, talk to your doctor, pharmacist, or health care provider.    2016, Elsevier/Gold Standard. (2014-06-09 14:30:14)  

## 2015-11-14 ENCOUNTER — Telehealth: Payer: Self-pay | Admitting: Oncology

## 2015-11-14 NOTE — Telephone Encounter (Signed)
Faxed pt office note and lab to [facility] [phone]

## 2015-11-20 ENCOUNTER — Other Ambulatory Visit: Payer: Self-pay | Admitting: Oncology

## 2015-11-20 ENCOUNTER — Ambulatory Visit (HOSPITAL_BASED_OUTPATIENT_CLINIC_OR_DEPARTMENT_OTHER): Payer: BLUE CROSS/BLUE SHIELD | Admitting: Nurse Practitioner

## 2015-11-20 ENCOUNTER — Ambulatory Visit (HOSPITAL_BASED_OUTPATIENT_CLINIC_OR_DEPARTMENT_OTHER): Payer: BLUE CROSS/BLUE SHIELD

## 2015-11-20 ENCOUNTER — Other Ambulatory Visit: Payer: PRIVATE HEALTH INSURANCE

## 2015-11-20 ENCOUNTER — Other Ambulatory Visit (HOSPITAL_BASED_OUTPATIENT_CLINIC_OR_DEPARTMENT_OTHER): Payer: BLUE CROSS/BLUE SHIELD

## 2015-11-20 VITALS — BP 126/80 | HR 69 | Temp 98.4°F | Resp 18 | Wt 170.0 lb

## 2015-11-20 DIAGNOSIS — D630 Anemia in neoplastic disease: Secondary | ICD-10-CM | POA: Diagnosis not present

## 2015-11-20 DIAGNOSIS — C787 Secondary malignant neoplasm of liver and intrahepatic bile duct: Secondary | ICD-10-CM | POA: Diagnosis not present

## 2015-11-20 DIAGNOSIS — Z5112 Encounter for antineoplastic immunotherapy: Secondary | ICD-10-CM | POA: Diagnosis not present

## 2015-11-20 DIAGNOSIS — C20 Malignant neoplasm of rectum: Secondary | ICD-10-CM

## 2015-11-20 DIAGNOSIS — Z5111 Encounter for antineoplastic chemotherapy: Secondary | ICD-10-CM

## 2015-11-20 DIAGNOSIS — G620 Drug-induced polyneuropathy: Secondary | ICD-10-CM

## 2015-11-20 DIAGNOSIS — Z95828 Presence of other vascular implants and grafts: Secondary | ICD-10-CM

## 2015-11-20 LAB — COMPREHENSIVE METABOLIC PANEL
ALBUMIN: 3.3 g/dL — AB (ref 3.5–5.0)
ALK PHOS: 93 U/L (ref 40–150)
ALT: 11 U/L (ref 0–55)
AST: 18 U/L (ref 5–34)
Anion Gap: 6 mEq/L (ref 3–11)
BUN: 10.5 mg/dL (ref 7.0–26.0)
CHLORIDE: 106 meq/L (ref 98–109)
CO2: 26 mEq/L (ref 22–29)
CREATININE: 1.1 mg/dL (ref 0.7–1.3)
Calcium: 9.1 mg/dL (ref 8.4–10.4)
EGFR: 87 mL/min/{1.73_m2} — ABNORMAL LOW (ref >90)
GLUCOSE: 101 mg/dL (ref 70–140)
POTASSIUM: 4.2 meq/L (ref 3.5–5.1)
SODIUM: 139 meq/L (ref 136–145)
Total Bilirubin: <0.3 mg/dL (ref 0.20–1.20)
Total Protein: 7.5 g/dL (ref 6.4–8.3)

## 2015-11-20 LAB — CBC WITH DIFFERENTIAL/PLATELET
BASO%: 0.5 % (ref 0.0–2.0)
BASOS ABS: 0 10*3/uL (ref 0.0–0.1)
EOS%: 2.3 % (ref 0.0–7.0)
Eosinophils Absolute: 0.1 10*3/uL (ref 0.0–0.5)
HCT: 33 % — ABNORMAL LOW (ref 38.4–49.9)
HEMOGLOBIN: 10.5 g/dL — AB (ref 13.0–17.1)
LYMPH#: 1.6 10*3/uL (ref 0.9–3.3)
LYMPH%: 26.2 % (ref 14.0–49.0)
MCH: 29 pg (ref 27.2–33.4)
MCHC: 31.9 g/dL — ABNORMAL LOW (ref 32.0–36.0)
MCV: 90.9 fL (ref 79.3–98.0)
MONO#: 0.7 10*3/uL (ref 0.1–0.9)
MONO%: 11.6 % (ref 0.0–14.0)
NEUT#: 3.6 10*3/uL (ref 1.5–6.5)
NEUT%: 59.4 % (ref 39.0–75.0)
Platelets: 210 10*3/uL (ref 140–400)
RBC: 3.63 10*6/uL — ABNORMAL LOW (ref 4.20–5.82)
RDW: 22.6 % — AB (ref 11.0–14.6)
WBC: 6.1 10*3/uL (ref 4.0–10.3)

## 2015-11-20 LAB — UA PROTEIN, DIPSTICK - CHCC: Protein, ur: NEGATIVE mg/dL

## 2015-11-20 MED ORDER — SODIUM CHLORIDE 0.9 % IV SOLN
2455.0000 mg/m2 | INTRAVENOUS | Status: DC
  Administered 2015-11-20: 5000 mg via INTRAVENOUS
  Filled 2015-11-20: qty 100

## 2015-11-20 MED ORDER — PALONOSETRON HCL INJECTION 0.25 MG/5ML
INTRAVENOUS | Status: AC
  Filled 2015-11-20: qty 5

## 2015-11-20 MED ORDER — FLUOROURACIL CHEMO INJECTION 2.5 GM/50ML
400.0000 mg/m2 | Freq: Once | INTRAVENOUS | Status: AC
  Administered 2015-11-20: 800 mg via INTRAVENOUS
  Filled 2015-11-20: qty 16

## 2015-11-20 MED ORDER — DEXTROSE 5 % IV SOLN
Freq: Once | INTRAVENOUS | Status: AC
  Administered 2015-11-20: 13:00:00 via INTRAVENOUS

## 2015-11-20 MED ORDER — SODIUM CHLORIDE 0.9 % IV SOLN
Freq: Once | INTRAVENOUS | Status: AC
  Administered 2015-11-20: 12:00:00 via INTRAVENOUS
  Filled 2015-11-20: qty 5

## 2015-11-20 MED ORDER — SODIUM CHLORIDE 0.9 % IV SOLN
Freq: Once | INTRAVENOUS | Status: AC
  Administered 2015-11-20: 12:00:00 via INTRAVENOUS

## 2015-11-20 MED ORDER — OXALIPLATIN CHEMO INJECTION 100 MG/20ML
85.0000 mg/m2 | Freq: Once | INTRAVENOUS | Status: AC
  Administered 2015-11-20: 175 mg via INTRAVENOUS
  Filled 2015-11-20: qty 35

## 2015-11-20 MED ORDER — SODIUM CHLORIDE 0.9 % IJ SOLN
10.0000 mL | INTRAMUSCULAR | Status: DC | PRN
  Administered 2015-11-20: 10 mL via INTRAVENOUS
  Filled 2015-11-20: qty 10

## 2015-11-20 MED ORDER — PALONOSETRON HCL INJECTION 0.25 MG/5ML
0.2500 mg | Freq: Once | INTRAVENOUS | Status: AC
  Administered 2015-11-20: 0.25 mg via INTRAVENOUS

## 2015-11-20 MED ORDER — SODIUM CHLORIDE 0.9 % IV SOLN
5.0000 mg/kg | Freq: Once | INTRAVENOUS | Status: AC
  Administered 2015-11-20: 400 mg via INTRAVENOUS
  Filled 2015-11-20: qty 16

## 2015-11-20 MED ORDER — LEUCOVORIN CALCIUM INJECTION 350 MG
394.0000 mg/m2 | Freq: Once | INTRAVENOUS | Status: AC
  Administered 2015-11-20: 800 mg via INTRAVENOUS
  Filled 2015-11-20: qty 40

## 2015-11-20 NOTE — Progress Notes (Signed)
  [place] OFFICE PROGRESS NOTE   Diagnosis:  Rectal cancer  INTERVAL HISTORY:   Mr. Pizzoferrato returns as scheduled. He completed cycle 9 FOLFOX plus Avastin 11/06/2015. He denies nausea/vomiting. No mouth sores. No diarrhea. Cold sensitivity lasted 3 days. No persistent neuropathy symptoms. No bleeding. No shortness of breath or chest pain. No leg swelling or calf pain. Good appetite. He is gaining weight.  Objective:  Vital signs in last 24 hours:  Blood pressure 126/80, pulse 69, temperature 98.4 F (36.9 C), temperature source Oral, resp. rate 18, weight 170 lb (77.111 kg), SpO2 100 %.    HEENT: No thrush or ulcers. Resp: Lungs clear bilaterally. Cardio: Regular rate and rhythm. GI: Abdomen soft and nontender. No hepatomegaly. Vascular: No leg edema. Neuro: Vibratory sense mildly decreased over the fingertips per tuning fork exam.  Skin: No rash. Port-A-Cath without erythema.    Lab Results:  Lab Results  Component Value Date   WBC 6.1 11/20/2015   HGB 10.5* 11/20/2015   HCT 33.0* 11/20/2015   MCV 90.9 11/20/2015   PLT 210 11/20/2015   NEUTROABS 3.6 11/20/2015    Imaging:  No results found.  Medications: I have reviewed the patient's current medications.  Assessment/Plan: 1. Rectal cancer, invasive adenocarcinoma confirmed at colonoscopy 05/22/2015, EUS staging 06/01/2015 revealed a uT3,N1 lesion at 7 cm from the anal verge  Staging CT abdomen/pelvis 05/15/2015 confirmed a mass at the rectosigmoid colon, a suspicious perirectal lymph node, and a 6 mm right liver lesion felt to most likely be a cyst  CT chest 05/30/2015 with indeterminate pulmonary nodules and multiple liver lesions suspicious for metastases  Ultrasound 06/13/2014 with no liver lesions identified  MRI abdomen 06/21/2015 consistent with multiple liver metastases  CT biopsy of liver lesion 06/26/2015. Pathology showed metastatic adenocarcinoma consistent with colonic  primary.  Cycle 1 FOLFOX 07/10/2015  Cycle 2 FOLFOX plus Avastin 07/24/2015 with Neulasta support  Cycle 3 FOLFOX plus Avastin 08/07/2015  Cycle 4 FOLFOX plus Avastin 08/21/2015  cycle 5 FOLFOX plus Avastin 09/04/2015  Restaging CTs 09/15/2015 revealed a decrease in the size of liver lesions, decreased rectal primary, stable/decreased size of tiny lung nodules  Cycle 6 FOLFOX plus Avastin 09/18/2015  Cycle 7 FOLFOX plus Avastin 10/02/2015  Cycle 8 FOLFOX plus Avastin 10/16/2015  Cycle 9 FOLFOX plus Avastin 11/06/2015  Cycle 10 FOLFOX plus Avastin 11/20/2015 2. Multiple colon polyps on the colonoscopy 05/22/2015, tubular villous adenoma and a tubular adenoma were removed 3. Anemia-likely secondary to rectal bleeding.  4. Family history of multiple cancers including breast and prostate cancer 5. Transient right arm numbness 06/20/2015 6. Port-A-Cath placement 07/06/2015 by Dr. Marcello Moores 7. Mild neutropenia secondary to chemotherapy following cycle 1 FOLFOX. Neulasta added with cycle 2. 8. Delayed nausea following chemotherapy-emend added with cycle 3 FOLFOX 9. Early oxaliplatin neuropathy   Disposition: Mr. Ruedas appears well. He has completed 9 cycles of FOLFOX plus Avastin. Plan to proceed with cycle 10 today as scheduled. Restaging CT evaluation planned 12/07/2015. He will return for a follow-up visit 12/11/2015. He will contact the office in the interim with any problems.    Ned Card ANP/GNP-BC   11/20/2015  10:24 AM

## 2015-11-20 NOTE — Patient Instructions (Signed)

## 2015-11-20 NOTE — Patient Instructions (Signed)
[facility] Discharge Instructions for Patients Receiving Chemotherapy  Today you received the following chemotherapy agents Oxaliplatin/Leucovorin/Adrucil/Avastin  To help prevent nausea and vomiting after your treatment, we encourage you to take your nausea medication    If you develop nausea and vomiting that is not controlled by your nausea medication, call the clinic.   BELOW ARE SYMPTOMS THAT SHOULD BE REPORTED IMMEDIATELY:  *FEVER GREATER THAN 100.5 F  *CHILLS WITH OR WITHOUT FEVER  NAUSEA AND VOMITING THAT IS NOT CONTROLLED WITH YOUR NAUSEA MEDICATION  *UNUSUAL SHORTNESS OF BREATH  *UNUSUAL BRUISING OR BLEEDING  TENDERNESS IN MOUTH AND THROAT WITH OR WITHOUT PRESENCE OF ULCERS  *URINARY PROBLEMS  *BOWEL PROBLEMS  UNUSUAL RASH Items with * indicate a potential emergency and should be followed up as soon as possible.  Feel free to call the clinic you have any questions or concerns. The clinic phone number is [phone].  Please show the CHEMO ALERT CARD at check-in to the Emergency Department and triage nurse.   

## 2015-11-22 ENCOUNTER — Ambulatory Visit (HOSPITAL_BASED_OUTPATIENT_CLINIC_OR_DEPARTMENT_OTHER): Payer: PRIVATE HEALTH INSURANCE

## 2015-11-22 ENCOUNTER — Ambulatory Visit: Payer: PRIVATE HEALTH INSURANCE

## 2015-11-22 VITALS — BP 149/81 | HR 52 | Temp 98.5°F | Resp 17

## 2015-11-22 DIAGNOSIS — C20 Malignant neoplasm of rectum: Secondary | ICD-10-CM | POA: Diagnosis not present

## 2015-11-22 DIAGNOSIS — Z5189 Encounter for other specified aftercare: Secondary | ICD-10-CM | POA: Diagnosis not present

## 2015-11-22 MED ORDER — HEPARIN SOD (PORK) LOCK FLUSH 100 UNIT/ML IV SOLN
500.0000 [IU] | Freq: Once | INTRAVENOUS | Status: AC | PRN
  Administered 2015-11-22: 500 [IU]
  Filled 2015-11-22: qty 5

## 2015-11-22 MED ORDER — SODIUM CHLORIDE 0.9% FLUSH
10.0000 mL | INTRAVENOUS | Status: DC | PRN
  Administered 2015-11-22: 10 mL
  Filled 2015-11-22: qty 10

## 2015-11-22 MED ORDER — PEGFILGRASTIM INJECTION 6 MG/0.6ML ~~LOC~~
6.0000 mg | PREFILLED_SYRINGE | Freq: Once | SUBCUTANEOUS | Status: AC
  Administered 2015-11-22: 6 mg via SUBCUTANEOUS
  Filled 2015-11-22: qty 0.6

## 2015-11-22 NOTE — Progress Notes (Unsigned)
Pt given injection in infusion room.

## 2015-12-01 ENCOUNTER — Other Ambulatory Visit: Payer: Self-pay | Admitting: Internal Medicine

## 2015-12-07 ENCOUNTER — Ambulatory Visit (HOSPITAL_COMMUNITY)
Admission: RE | Admit: 2015-12-07 | Discharge: 2015-12-07 | Disposition: A | Discharge status: Home / Self Care | Payer: BLUE CROSS/BLUE SHIELD | Source: Ambulatory Visit | Attending: Oncology | Admitting: Oncology

## 2015-12-07 ENCOUNTER — Encounter (HOSPITAL_COMMUNITY): Payer: Self-pay

## 2015-12-07 DIAGNOSIS — N400 Enlarged prostate without lower urinary tract symptoms: Secondary | ICD-10-CM | POA: Diagnosis not present

## 2015-12-07 DIAGNOSIS — R918 Other nonspecific abnormal finding of lung field: Secondary | ICD-10-CM | POA: Insufficient documentation

## 2015-12-07 DIAGNOSIS — C787 Secondary malignant neoplasm of liver and intrahepatic bile duct: Secondary | ICD-10-CM | POA: Insufficient documentation

## 2015-12-07 DIAGNOSIS — C20 Malignant neoplasm of rectum: Secondary | ICD-10-CM | POA: Insufficient documentation

## 2015-12-07 DIAGNOSIS — I700 Atherosclerosis of aorta: Secondary | ICD-10-CM | POA: Insufficient documentation

## 2015-12-07 MED ORDER — IOPAMIDOL (ISOVUE-300) INJECTION 61%
100.0000 mL | Freq: Once | INTRAVENOUS | Status: AC | PRN
  Administered 2015-12-07: 100 mL via INTRAVENOUS

## 2015-12-08 ENCOUNTER — Telehealth: Payer: Self-pay | Admitting: *Deleted

## 2015-12-08 NOTE — Telephone Encounter (Signed)
-----   Message from Ladell Pier, MD sent at 12/07/2015  6:47 PM EDT ----- Please call patient, CTs show rectal tumor and liver leisons are better.  Evidence of inflammation in left lung.  Does he have symptoms of pneumonia??

## 2015-12-08 NOTE — Telephone Encounter (Signed)
Called pt with CT results, per Dr. Gearldine Shown note below. Pt denies any dyspnea, fevers or cough. Instructed him to call office if he develops any symptoms of pneumonia. Pt will follow up 7/10 as scheduled.

## 2015-12-10 ENCOUNTER — Other Ambulatory Visit: Payer: Self-pay | Admitting: Oncology

## 2015-12-11 ENCOUNTER — Ambulatory Visit: Payer: PRIVATE HEALTH INSURANCE | Admitting: Nutrition

## 2015-12-11 ENCOUNTER — Encounter: Payer: Self-pay | Admitting: Pharmacist

## 2015-12-11 ENCOUNTER — Ambulatory Visit: Payer: PRIVATE HEALTH INSURANCE

## 2015-12-11 ENCOUNTER — Ambulatory Visit (HOSPITAL_BASED_OUTPATIENT_CLINIC_OR_DEPARTMENT_OTHER): Payer: BLUE CROSS/BLUE SHIELD | Admitting: Oncology

## 2015-12-11 ENCOUNTER — Other Ambulatory Visit (HOSPITAL_BASED_OUTPATIENT_CLINIC_OR_DEPARTMENT_OTHER): Payer: BLUE CROSS/BLUE SHIELD

## 2015-12-11 ENCOUNTER — Other Ambulatory Visit: Payer: Self-pay | Admitting: *Deleted

## 2015-12-11 ENCOUNTER — Telehealth: Payer: Self-pay | Admitting: Oncology

## 2015-12-11 ENCOUNTER — Telehealth: Payer: Self-pay | Admitting: *Deleted

## 2015-12-11 ENCOUNTER — Ambulatory Visit (HOSPITAL_BASED_OUTPATIENT_CLINIC_OR_DEPARTMENT_OTHER): Payer: BLUE CROSS/BLUE SHIELD

## 2015-12-11 VITALS — BP 141/76 | HR 68 | Temp 98.3°F | Resp 17 | Ht 73.0 in | Wt 172.7 lb

## 2015-12-11 DIAGNOSIS — C20 Malignant neoplasm of rectum: Secondary | ICD-10-CM

## 2015-12-11 DIAGNOSIS — C787 Secondary malignant neoplasm of liver and intrahepatic bile duct: Secondary | ICD-10-CM

## 2015-12-11 DIAGNOSIS — R911 Solitary pulmonary nodule: Secondary | ICD-10-CM | POA: Diagnosis not present

## 2015-12-11 DIAGNOSIS — G620 Drug-induced polyneuropathy: Secondary | ICD-10-CM | POA: Diagnosis not present

## 2015-12-11 DIAGNOSIS — Z5112 Encounter for antineoplastic immunotherapy: Secondary | ICD-10-CM

## 2015-12-11 DIAGNOSIS — D649 Anemia, unspecified: Secondary | ICD-10-CM

## 2015-12-11 DIAGNOSIS — Z95828 Presence of other vascular implants and grafts: Secondary | ICD-10-CM

## 2015-12-11 LAB — BASIC METABOLIC PANEL
Anion Gap: 8 mEq/L (ref 3–11)
BUN: 11.1 mg/dL (ref 7.0–26.0)
CHLORIDE: 104 meq/L (ref 98–109)
CO2: 25 mEq/L (ref 22–29)
Calcium: 9.3 mg/dL (ref 8.4–10.4)
Creatinine: 1.2 mg/dL (ref 0.7–1.3)
EGFR: 75 mL/min/{1.73_m2} — ABNORMAL LOW (ref >90)
Glucose: 90 mg/dl (ref 70–140)
POTASSIUM: 4.5 meq/L (ref 3.5–5.1)
Sodium: 138 mEq/L (ref 136–145)

## 2015-12-11 LAB — CBC WITH DIFFERENTIAL/PLATELET
BASO%: 0.5 % (ref 0.0–2.0)
Basophils Absolute: 0 10*3/uL (ref 0.0–0.1)
EOS%: 1.2 % (ref 0.0–7.0)
Eosinophils Absolute: 0.1 10*3/uL (ref 0.0–0.5)
HCT: 34.3 % — ABNORMAL LOW (ref 38.4–49.9)
HGB: 11 g/dL — ABNORMAL LOW (ref 13.0–17.1)
LYMPH#: 1.9 10*3/uL (ref 0.9–3.3)
LYMPH%: 26.9 % (ref 14.0–49.0)
MCH: 29.8 pg (ref 27.2–33.4)
MCHC: 32 g/dL (ref 32.0–36.0)
MCV: 93 fL (ref 79.3–98.0)
MONO#: 0.7 10*3/uL (ref 0.1–0.9)
MONO%: 10.5 % (ref 0.0–14.0)
NEUT%: 60.9 % (ref 39.0–75.0)
NEUTROS ABS: 4.2 10*3/uL (ref 1.5–6.5)
PLATELETS: 239 10*3/uL (ref 140–400)
RBC: 3.69 10*6/uL — AB (ref 4.20–5.82)
RDW: 21.3 % — AB (ref 11.0–14.6)
WBC: 6.9 10*3/uL (ref 4.0–10.3)

## 2015-12-11 LAB — UA PROTEIN, DIPSTICK - CHCC: PROTEIN: NEGATIVE mg/dL

## 2015-12-11 MED ORDER — SODIUM CHLORIDE 0.9% FLUSH
10.0000 mL | INTRAVENOUS | Status: DC | PRN
  Administered 2015-12-11: 10 mL
  Filled 2015-12-11: qty 10

## 2015-12-11 MED ORDER — SODIUM CHLORIDE 0.9% FLUSH
10.0000 mL | INTRAVENOUS | Status: DC | PRN
Start: 2015-12-11 — End: 2015-12-11
  Filled 2015-12-11: qty 10

## 2015-12-11 MED ORDER — SODIUM CHLORIDE 0.9 % IV SOLN: Freq: Once | INTRAVENOUS | Status: DC

## 2015-12-11 MED ORDER — HEPARIN SOD (PORK) LOCK FLUSH 100 UNIT/ML IV SOLN
500.0000 [IU] | Freq: Once | INTRAVENOUS | Status: AC | PRN
  Administered 2015-12-11: 500 [IU]
  Filled 2015-12-11: qty 5

## 2015-12-11 MED ORDER — SODIUM CHLORIDE 0.9 % IJ SOLN
10.0000 mL | INTRAMUSCULAR | Status: DC | PRN
  Administered 2015-12-11: 10 mL via INTRAVENOUS
  Filled 2015-12-11: qty 10

## 2015-12-11 MED ORDER — SODIUM CHLORIDE 0.9 % IV SOLN: 5.0000 mg/kg | Freq: Once | INTRAVENOUS | Status: DC

## 2015-12-11 MED ORDER — SODIUM CHLORIDE 0.9 % IV SOLN
7.5000 mg/kg | Freq: Once | INTRAVENOUS | Status: AC
  Administered 2015-12-11: 575 mg via INTRAVENOUS
  Filled 2015-12-11: qty 7

## 2015-12-11 MED ORDER — SODIUM CHLORIDE 0.9 % IV SOLN
Freq: Once | INTRAVENOUS | Status: AC
  Administered 2015-12-11: 11:00:00 via INTRAVENOUS

## 2015-12-11 MED ORDER — HEPARIN SOD (PORK) LOCK FLUSH 100 UNIT/ML IV SOLN
500.0000 [IU] | Freq: Once | INTRAVENOUS | Status: DC | PRN
  Filled 2015-12-11: qty 5

## 2015-12-11 NOTE — Progress Notes (Signed)
Received correspondence from [facility] - due to pts insurance, [employer] of [place], will require Xeloda be filled using their specialty pharmacy. I contacted [place] = [facility] (ph# [phone]).  A prior Josem Kaufmann is required & I will contact [employer] [place] tomorrow (they're already closed today) for the PA (ph# [phone], option 4 then option 1 = [facility]).  I s/w Joellen Jersey, pharmacist at [facility] (ph# [phone]; fax# [phone]) and gave her the RX for Xeloda verbally.  She has marked the Rx as STAT and will process over the next few business days.  She is aware that PA is needed.  Once we have the PA, we can fax that info to [facility] per Joellen Jersey.  Note: the insurance card we have scanned in EPIC from Jan 2017 for [place] is no longer valid.  I contacted [employer] and they stated pt no longer has [employer] coverage.  Kennith Center, Pharm.D., CPP 12/11/2015@5 :12 PM Oral Chemo Clinic

## 2015-12-11 NOTE — Telephone Encounter (Signed)
Called patient to see if he is experiencing any SOB, cough or fevers at this time per Dr. Gearldine Shown request.  Patient denies any complaints at this time.  Dr. Benay Spice notified.

## 2015-12-11 NOTE — Progress Notes (Signed)
Oral Chemotherapy Pharmacist Encounter  New prescription for Xeloda has been sent to the [facility] outpatient pharmacy for benefit analysis and approval.   Will follow up with patient regarding insurance and pharmacy.   Once pt begins Xeloda, we will follow up in 1-2 weeks for adherence and toxicity management.   Thank you, Kennith Center, Pharm.D., CPP 12/11/2015@2 :08 PM Oral Chemotherapy Clinic

## 2015-12-11 NOTE — Patient Instructions (Signed)
Bevacizumab injection What is this medicine? BEVACIZUMAB (be va SIZ yoo mab) is a monoclonal antibody. It is used to treat cervical cancer, colorectal cancer, glioblastoma multiforme, non-small cell lung cancer (NSCLC), ovarian cancer, and renal cell cancer. This medicine may be used for other purposes; ask your health care provider or pharmacist if you have questions. What should I tell my health care provider before I take this medicine? They need to know if you have any of these conditions: -blood clots -heart disease, including heart failure, heart attack, or chest pain (angina) -high blood pressure -infection (especially a virus infection such as chickenpox, cold sores, or herpes) -kidney disease -lung disease -prior chemotherapy with doxorubicin, daunorubicin, epirubicin, or other anthracycline type chemotherapy agents -recent or ongoing radiation therapy -recent surgery -stroke -an unusual or allergic reaction to bevacizumab, hamster proteins, mouse proteins, other medicines, foods, dyes, or preservatives -pregnant or trying to get pregnant -breast-feeding How should I use this medicine? This medicine is for infusion into a vein. It is given by a health care professional in a hospital or clinic setting. Talk to your pediatrician regarding the use of this medicine in children. Special care may be needed. Overdosage: If you think you have taken too much of this medicine contact a poison control center or emergency room at once. NOTE: This medicine is only for you. Do not share this medicine with others. What if I miss a dose? It is important not to miss your dose. Call your doctor or health care professional if you are unable to keep an appointment. What may interact with this medicine? Interactions are not expected. This list may not describe all possible interactions. Give your health care provider a list of all the medicines, herbs, non-prescription drugs, or dietary supplements  you use. Also tell them if you smoke, drink alcohol, or use illegal drugs. Some items may interact with your medicine. What should I watch for while using this medicine? Your condition will be monitored carefully while you are receiving this medicine. You will need important blood work and urine testing done while you are taking this medicine. During your treatment, let your health care professional know if you have any unusual symptoms, such as difficulty breathing. This medicine may rarely cause 'gastrointestinal perforation' (holes in the stomach, intestines or colon), a serious side effect requiring surgery to repair. This medicine should be started at least 28 days following major surgery and the site of the surgery should be totally healed. Check with your doctor before scheduling dental work or surgery while you are receiving this treatment. Talk to your doctor if you have recently had surgery or if you have a wound that has not healed. Do not become pregnant while taking this medicine or for 6 months after stopping it. Women should inform their doctor if they wish to become pregnant or think they might be pregnant. There is a potential for serious side effects to an unborn child. Talk to your health care professional or pharmacist for more information. Do not breast-feed an infant while taking this medicine. This medicine has caused ovarian failure in some women. This medicine may interfere with the ability to have a child. You should talk to your doctor or health care professional if you are concerned about your fertility. What side effects may I notice from receiving this medicine? Side effects that you should report to your doctor or health care professional as soon as possible: -allergic reactions like skin rash, itching or hives, swelling of the face,   lips, or tongue -signs of infection - fever or chills, cough, sore throat, pain or trouble passing urine -signs of decreased platelets or  bleeding - bruising, pinpoint red spots on the skin, black, tarry stools, nosebleeds, blood in the urine -breathing problems -changes in vision -chest pain -confusion -jaw pain, especially after dental work -mouth sores -seizures -severe abdominal pain -severe headache -sudden numbness or weakness of the face, arm or leg -swelling of legs or ankles -symptoms of a stroke: change in mental awareness, inability to talk or move one side of the body (especially in patients with lung cancer) -trouble passing urine or change in the amount of urine -trouble speaking or understanding -trouble walking, dizziness, loss of balance or coordination Side effects that usually do not require medical attention (report to your doctor or health care professional if they continue or are bothersome): -constipation -diarrhea -dry skin -headache -loss of appetite -nausea, vomiting This list may not describe all possible side effects. Call your doctor for medical advice about side effects. You may report side effects to FDA at 1-800-FDA-1088. Where should I keep my medicine? This drug is given in a hospital or clinic and will not be stored at home. NOTE: This sheet is a summary. It may not cover all possible information. If you have questions about this medicine, talk to your doctor, pharmacist, or health care provider.    2016, Elsevier/Gold Standard. (2014-07-19 16:58:44)  

## 2015-12-11 NOTE — Progress Notes (Signed)
[name]   Diagnosis: Rectal cancer  INTERVAL HISTORY:   [name] returns as scheduled. He completed another cycle of FOLFOX/Avastin on 11/20/2015. No nausea/vomiting, mouth sores, or diarrhea. He reports mild numbness and tingling in the hands and feet. No other complaint.  Objective:  Vital signs in last 24 hours:  Blood pressure 141/76, pulse 68, temperature 98.3 F (36.8 C), temperature source Oral, resp. rate 17, height 6\' 1"  (1.854 m), weight 172 lb 11.2 oz (78.336 kg), SpO2 100 %.    HEENT: No thrush or ulcers Lymphatics: No cervical, supraclavicular, axillary, or inguinal nodes Resp: Lungs clear bilaterally Cardio: Regular rate and rhythm GI: No hepatomegaly, nontender Vascular: No leg edema Neuro: Mild loss of vibratory sense at negatives bilaterally  Skin: Hyperpigmentation of the hands   Portacath/PICC-without erythema  Lab Results:  Lab Results  Component Value Date   WBC 6.9 12/11/2015   HGB 11.0* 12/11/2015   HCT 34.3* 12/11/2015   MCV 93.0 12/11/2015   PLT 239 12/11/2015   NEUTROABS 4.2 12/11/2015      Lab Results  Component Value Date   CEA1 7.1* 10/16/2015    Imaging:  No results found.  Medications: I have reviewed the patient's current medications.  Assessment/Plan: 1. Rectal cancer, invasive adenocarcinoma confirmed at colonoscopy 05/22/2015, EUS staging 06/01/2015 revealed a uT3,N1 lesion at 7 cm from the anal verge  Staging CT abdomen/pelvis 05/15/2015 confirmed a mass at the rectosigmoid colon, a suspicious perirectal lymph node, and a 6 mm right liver lesion felt to most likely be a cyst  CT chest 05/30/2015 with indeterminate pulmonary nodules and multiple liver lesions suspicious for metastases  Ultrasound 06/13/2014 with no liver lesions identified  MRI abdomen 06/21/2015 consistent with multiple liver metastases  CT biopsy of liver lesion 06/26/2015. Pathology showed metastatic  adenocarcinoma consistent with colonic primary.  Cycle 1 FOLFOX 07/10/2015  Cycle 2 FOLFOX plus Avastin 07/24/2015 with Neulasta support  Cycle 3 FOLFOX plus Avastin 08/07/2015  Cycle 4 FOLFOX plus Avastin 08/21/2015  cycle 5 FOLFOX plus Avastin 09/04/2015  Restaging CTs 09/15/2015 revealed a decrease in the size of liver lesions, decreased rectal primary, stable/decreased size of tiny lung nodules  Cycle 6 FOLFOX plus Avastin 09/18/2015  Cycle 7 FOLFOX plus Avastin 10/02/2015  Cycle 8 FOLFOX plus Avastin 10/16/2015  Cycle 9 FOLFOX plus Avastin 11/06/2015  Cycle 10 FOLFOX plus Avastin 11/20/2015  CTs 12/07/2015-mild decrease in wall thickening at the rectum, decrease in tiny hepatic metastases, stable lung nodules, no evidence of progressive metastatic disease, new left lower lobe infiltrate  Treatment changed to every three-week Avastin with every other week Xeloda beginning 12/11/2015 2. Multiple colon polyps on the colonoscopy 05/22/2015, tubular villous adenoma and a tubular adenoma were removed 3. Anemia-likely secondary to rectal bleeding.  4. Family history of multiple cancers including breast and prostate cancer 5. Transient right arm numbness 06/20/2015 6. Port-A-Cath placement 07/06/2015 by Dr. Marcello Moores 7. Mild neutropenia secondary to chemotherapy following cycle 1 FOLFOX. Neulasta added with cycle 2. 8. Delayed nausea following chemotherapy-emend added with cycle 3 FOLFOX 9. Early oxaliplatin neuropathy   Disposition:  Mr. Golas appears well. The restaging CT confirms continued improvement in the rectal primary and liver metastases. He has completed 10 cycles of FOLFOX/Avastin and now has neuropathy symptoms. We decided to switch to "maintenance "Xeloda/Avastin. He will be switched to every three-week Avastin starting today. He will begin every other week Xeloda. We reviewed the potential toxicities associated with Xeloda and he  agrees to proceed.  He has no  symptoms to suggest pneumonia.  Betsy Coder, MD  12/11/2015  10:26 AM

## 2015-12-11 NOTE — Progress Notes (Signed)
Per Dr. Benay Spice, pt to receive only Avastin today.

## 2015-12-11 NOTE — Progress Notes (Signed)
Nutrition follow-up completed with patient diagnosed with rectal cancer. Patient reports good appetite and increased oral intake. Weight is up ~4# and documented as 172 pounds on July 10. Patient denies nutrition impact He has not been drinking oral nutrition supplements but consuming 1-2 milk shakes/day from cookout.  He tries to eat plenty of protein at mealtimes.  Nutrition diagnosis: Unintended weight loss improved.  Intervention: Patient educated to continue strategies for adequate calories and protein to promote weight maintenance. Teach back method used.  Monitoring, evaluation, goals: Patient will continue to tolerate adequate calories and protein to promote weight maintenance.  Next visit: to be scheduled.

## 2015-12-11 NOTE — Telephone Encounter (Signed)
Gave patient avs report and appointments for July. Per 7/10 pof lab/flush/LT/avastin 7/31 - care plan also changed to avastin only starting 7/31. Per patient appointments for 7/12, 7/24 and 7/26 not needed - confirmed with desk nurse and appointments cxd. Next appointment 7/31.

## 2015-12-11 NOTE — Patient Instructions (Signed)

## 2015-12-12 ENCOUNTER — Encounter: Payer: Self-pay | Admitting: Pharmacist

## 2015-12-12 LAB — CEA: CEA: 5.2 ng/mL — ABNORMAL HIGH (ref 0.0–4.7)

## 2015-12-12 NOTE — Progress Notes (Signed)
PA submitted online: [email or web address] Will f/u on status tomorrow (12/13/15)  Kennith Center, Pharm.D., CPP 12/12/2015@1 :[facility]

## 2015-12-13 ENCOUNTER — Encounter: Payer: Self-pay | Admitting: Pharmacist

## 2015-12-13 ENCOUNTER — Ambulatory Visit: Payer: PRIVATE HEALTH INSURANCE

## 2015-12-13 NOTE — Progress Notes (Signed)
PA approved effective 12/13/15 - 12/11/16. Faxed a copy of approval documentation and faxed to [facility]. We will f/u w/ [facility] on Fri 12/15/15. Pt needs initial counseling.  Kennith Center, Pharm.D., CPP 12/13/2015@9 :05 AM Oral Chemo Clinic

## 2015-12-15 ENCOUNTER — Telehealth: Payer: Self-pay | Admitting: Pharmacist

## 2015-12-15 NOTE — Telephone Encounter (Signed)
Spoke to [employer]. The Xeloda Rx should be released today. They will contact the pt for drug shipment at the beginning of next week. Will f/u on 12/20/15

## 2015-12-20 ENCOUNTER — Telehealth: Payer: Self-pay | Admitting: Pharmacist

## 2015-12-20 ENCOUNTER — Encounter: Payer: Self-pay | Admitting: Oncology

## 2015-12-20 ENCOUNTER — Encounter: Payer: Self-pay | Admitting: *Deleted

## 2015-12-20 DIAGNOSIS — C20 Malignant neoplasm of rectum: Secondary | ICD-10-CM

## 2015-12-20 NOTE — Telephone Encounter (Signed)
Oral Chemotherapy Pharmacist Encounter  I spoke with patient for overview of new oral chemotherapy medication: Xeloda.  Dose is 2000 mg in AM/1500 mg in PM on days 1-7 and 15-21  Counseled patient on administration, dosing, side effects, safe handling, and monitoring. Side effects include but not limited to: stomatits, N/V/D, hand/foot syndrome.  He voiced understanding.  He has Compazine for nausea if needed. He will purchase Imodium if needed for diarrhea.  I have s/w [facility] (ph# [phone]) and they stated the copay amt = $215.97.  I told pt this and he cannot afford this until the 1st of the month.  He would like to seek pt assistance and we'll start that process for him today.  He is aware he may need to come to [facility] to complete application or provide proof of income.  [facility] does not seek any pt assistance.  That is the responsibility of the pt.  Once the pt has found assistance, then we'll need to notify [facility] of the source. I told pt he should hear back from [place] by the early part of next week (~7/25).  I gave the pt our contact number for the oral chemo clinic.  Once pt starts Xeloda we will follow up in 1-2 weeks for adherence and toxicity management.   Thank you, Kennith Center, Pharm.D., CPP 12/20/2015@12 :9 PM Oral Chemotherapy Clinic

## 2015-12-20 NOTE — Progress Notes (Signed)
Rcvd call from pt and from [place] (pharmacy) inquiring about copay assistance for Xeloda.  I informed him unfortunately the 3 foundations that covers that drug, 2 of them does not cover his Dx and the 3rd one is for Medicare pts.  He was approved for the [place] but he has used up most of those funds.  I apologized for not being able to help him.  He verbalized understanding.

## 2015-12-20 NOTE — Addendum Note (Signed)
Addended by: Tora Kindred on: 12/20/2015 01:58 PM   Modules accepted: Medications

## 2015-12-20 NOTE — Telephone Encounter (Signed)
Unfortunately, pt cannot afford $215.97 copay on a consistent basis w/ his current budget.  We cannot find co-pay assistance for him.  All funds have been allocated w/ Pt [place].  The pt does not qualify w/ [employer] because he does not have Medicare.  The manufacturer of Xeloda no longer provides assistance. Pt has s/w [employer] and she informed him there is no assistance through a grant w/ [place] either.  Pt aware of situation and he plans to discuss w/ Dr. Benay Spice when he is back at the end of this month. We'll f/u decision at that time & help in any way possible.  Kennith Center, Pharm.D., CPP 12/20/2015@1 :[facility]

## 2015-12-20 NOTE — Telephone Encounter (Signed)
I did confirm w/ [facility] that the [phone] copay was for generic Capecitabine (not brand Xeloda).

## 2015-12-25 ENCOUNTER — Ambulatory Visit: Payer: PRIVATE HEALTH INSURANCE | Admitting: Nurse Practitioner

## 2015-12-25 ENCOUNTER — Other Ambulatory Visit: Payer: PRIVATE HEALTH INSURANCE

## 2015-12-25 ENCOUNTER — Ambulatory Visit: Payer: PRIVATE HEALTH INSURANCE

## 2015-12-27 ENCOUNTER — Ambulatory Visit: Payer: PRIVATE HEALTH INSURANCE

## 2015-12-28 ENCOUNTER — Ambulatory Visit (INDEPENDENT_AMBULATORY_CARE_PROVIDER_SITE_OTHER): Payer: BLUE CROSS/BLUE SHIELD | Admitting: Internal Medicine

## 2015-12-28 ENCOUNTER — Encounter: Payer: Self-pay | Admitting: Internal Medicine

## 2015-12-28 DIAGNOSIS — K219 Gastro-esophageal reflux disease without esophagitis: Secondary | ICD-10-CM

## 2015-12-28 DIAGNOSIS — C20 Malignant neoplasm of rectum: Secondary | ICD-10-CM

## 2015-12-28 NOTE — Progress Notes (Signed)
   Subjective:    Patient ID: Hunter Walker, male    DOB: 1951-05-03, 65 y.o.   MRN: [id number]  HPI The patient is a 65 YO man coming in for follow up of his medical conditions. He is currently done with his chemotherapy for rectal cancer and has had regression of some lesions. He is about to start maintenance chemotherapy however the co-pay is 215 dollars which is too much for him to afford since he is still not working. 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 He is not able to return to work due to the ongoing demands of his medical treatment. Some mild neuropathy from the chemo but no other side effects.   Review of Systems  Constitutional: Positive for activity change. Negative for appetite change, fatigue and fever.  Respiratory: Negative.   Cardiovascular: Negative for chest pain, palpitations and leg swelling.  Gastrointestinal: Negative for abdominal distention, abdominal pain, anal bleeding, blood in stool, constipation, diarrhea and nausea.       GERD  Musculoskeletal: Negative.   Skin: Negative.   Neurological: Positive for numbness.       Mild neuropathy  Psychiatric/Behavioral: Negative.       Objective:   Physical Exam  Constitutional: He is oriented to person, place, and time. He appears well-developed and well-nourished.  HENT:  Head: Normocephalic and atraumatic.  Eyes: EOM are normal.  Neck: Normal range of motion.  Cardiovascular: Normal rate and regular rhythm.   Pulmonary/Chest: Effort normal and breath sounds normal. No respiratory distress. He has no wheezes. He has no rales.  Abdominal: Soft. Bowel sounds are normal. He exhibits no distension. There is no tenderness.  Musculoskeletal: He exhibits no edema.  Neurological: He is alert and oriented to person, place, and time. Coordination normal.  Skin: Skin is warm and dry.   Vitals:   12/28/15 0931  BP: 120/78  Pulse: 82  Resp: 14  Temp: 98.3 F (36.8  C)  TempSrc: Oral  Weight: 176 lb 1.9 oz (79.9 kg)  Height: 6' (1.829 m)      Assessment & Plan:

## 2015-12-28 NOTE — Progress Notes (Signed)
Pre visit review using our clinic review tool, if applicable. No additional management support is needed unless otherwise documented below in the visit note. 

## 2015-12-28 NOTE — Patient Instructions (Signed)
No changes today to the medicine and we can see you back in about 6 months for a physical.

## 2015-12-28 NOTE — Assessment & Plan Note (Signed)
Stable on protonix for now. Refill as needed.

## 2015-12-28 NOTE — Assessment & Plan Note (Signed)
About to start maintenance chemo when finances can be arranged.

## 2016-01-01 ENCOUNTER — Telehealth: Payer: Self-pay | Admitting: Oncology

## 2016-01-01 ENCOUNTER — Telehealth: Payer: Self-pay | Admitting: Nurse Practitioner

## 2016-01-01 ENCOUNTER — Other Ambulatory Visit (HOSPITAL_BASED_OUTPATIENT_CLINIC_OR_DEPARTMENT_OTHER): Payer: BLUE CROSS/BLUE SHIELD

## 2016-01-01 ENCOUNTER — Other Ambulatory Visit: Payer: Self-pay | Admitting: Oncology

## 2016-01-01 ENCOUNTER — Ambulatory Visit (HOSPITAL_BASED_OUTPATIENT_CLINIC_OR_DEPARTMENT_OTHER): Payer: BLUE CROSS/BLUE SHIELD | Admitting: Nurse Practitioner

## 2016-01-01 ENCOUNTER — Ambulatory Visit: Payer: BLUE CROSS/BLUE SHIELD

## 2016-01-01 ENCOUNTER — Ambulatory Visit: Payer: BLUE CROSS/BLUE SHIELD | Admitting: Nutrition

## 2016-01-01 ENCOUNTER — Ambulatory Visit (HOSPITAL_BASED_OUTPATIENT_CLINIC_OR_DEPARTMENT_OTHER): Payer: BLUE CROSS/BLUE SHIELD

## 2016-01-01 VITALS — BP 154/89 | HR 68 | Temp 97.8°F | Resp 18 | Ht 72.0 in | Wt 176.6 lb

## 2016-01-01 VITALS — BP 147/96

## 2016-01-01 DIAGNOSIS — C20 Malignant neoplasm of rectum: Secondary | ICD-10-CM

## 2016-01-01 DIAGNOSIS — G620 Drug-induced polyneuropathy: Secondary | ICD-10-CM | POA: Diagnosis not present

## 2016-01-01 DIAGNOSIS — D649 Anemia, unspecified: Secondary | ICD-10-CM

## 2016-01-01 DIAGNOSIS — Z95828 Presence of other vascular implants and grafts: Secondary | ICD-10-CM

## 2016-01-01 DIAGNOSIS — Z5112 Encounter for antineoplastic immunotherapy: Secondary | ICD-10-CM

## 2016-01-01 LAB — CBC WITH DIFFERENTIAL/PLATELET
BASO%: 0.5 % (ref 0.0–2.0)
BASOS ABS: 0 10*3/uL (ref 0.0–0.1)
EOS ABS: 0.2 10*3/uL (ref 0.0–0.5)
EOS%: 3.3 % (ref 0.0–7.0)
HEMATOCRIT: 36.5 % — AB (ref 38.4–49.9)
HGB: 11.8 g/dL — ABNORMAL LOW (ref 13.0–17.1)
LYMPH#: 2.2 10*3/uL (ref 0.9–3.3)
LYMPH%: 37.7 % (ref 14.0–49.0)
MCH: 30.6 pg (ref 27.2–33.4)
MCHC: 32.3 g/dL (ref 32.0–36.0)
MCV: 94.6 fL (ref 79.3–98.0)
MONO#: 0.4 10*3/uL (ref 0.1–0.9)
MONO%: 7.3 % (ref 0.0–14.0)
NEUT#: 3 10*3/uL (ref 1.5–6.5)
NEUT%: 51.2 % (ref 39.0–75.0)
PLATELETS: 230 10*3/uL (ref 140–400)
RBC: 3.86 10*6/uL — AB (ref 4.20–5.82)
RDW: 17.9 % — ABNORMAL HIGH (ref 11.0–14.6)
WBC: 5.8 10*3/uL (ref 4.0–10.3)

## 2016-01-01 LAB — COMPREHENSIVE METABOLIC PANEL
ALBUMIN: 3.5 g/dL (ref 3.5–5.0)
ALK PHOS: 75 U/L (ref 40–150)
ALT: 10 U/L (ref 0–55)
ANION GAP: 8 meq/L (ref 3–11)
AST: 20 U/L (ref 5–34)
BUN: 11 mg/dL (ref 7.0–26.0)
CALCIUM: 9.4 mg/dL (ref 8.4–10.4)
CHLORIDE: 105 meq/L (ref 98–109)
CO2: 27 mEq/L (ref 22–29)
Creatinine: 1.1 mg/dL (ref 0.7–1.3)
EGFR: 78 mL/min/{1.73_m2} — ABNORMAL LOW (ref >90)
Glucose: 90 mg/dl (ref 70–140)
POTASSIUM: 4.4 meq/L (ref 3.5–5.1)
Sodium: 139 mEq/L (ref 136–145)
Total Bilirubin: <0.3 mg/dL (ref 0.20–1.20)
Total Protein: 8.1 g/dL (ref 6.4–8.3)

## 2016-01-01 LAB — UA PROTEIN, DIPSTICK - CHCC: Protein, ur: NEGATIVE mg/dL

## 2016-01-01 MED ORDER — SODIUM CHLORIDE 0.9 % IJ SOLN
10.0000 mL | INTRAMUSCULAR | Status: DC | PRN
  Administered 2016-01-01: 10 mL via INTRAVENOUS
  Filled 2016-01-01: qty 10

## 2016-01-01 MED ORDER — SODIUM CHLORIDE 0.9 % IV SOLN
7.8000 mg/kg | Freq: Once | INTRAVENOUS | Status: AC
  Administered 2016-01-01: 600 mg via INTRAVENOUS
  Filled 2016-01-01: qty 16

## 2016-01-01 MED ORDER — SODIUM CHLORIDE 0.9% FLUSH
10.0000 mL | INTRAVENOUS | Status: DC | PRN
  Administered 2016-01-01: 10 mL
  Filled 2016-01-01: qty 10

## 2016-01-01 MED ORDER — SODIUM CHLORIDE 0.9 % IV SOLN
Freq: Once | INTRAVENOUS | Status: AC
  Administered 2016-01-01: 16:00:00 via INTRAVENOUS

## 2016-01-01 MED ORDER — HEPARIN SOD (PORK) LOCK FLUSH 100 UNIT/ML IV SOLN
500.0000 [IU] | Freq: Once | INTRAVENOUS | Status: AC | PRN
  Administered 2016-01-01: 500 [IU]
  Filled 2016-01-01: qty 5

## 2016-01-01 NOTE — Progress Notes (Signed)
Brief nutrition follow-up completed with patient.  Patient reports he feels well and is eating well.  Weight continues to increase and was documented as 176 pounds.  Patient denies nutrition impact symptoms encouraged him to contact me if he develops any problems eating during his remaining treatments.  Patient appreciative of information.

## 2016-01-01 NOTE — Telephone Encounter (Signed)
Gave pt cal & avs °

## 2016-01-01 NOTE — Patient Instructions (Signed)
[facility] Discharge Instructions for Patients Receiving Chemotherapy  Today you received the following chemotherapy agents: Avastin  To help prevent nausea and vomiting after your treatment, we encourage you to take your nausea medication. If you develop nausea and vomiting that is not controlled by your nausea medication, call the clinic.   BELOW ARE SYMPTOMS THAT SHOULD BE REPORTED IMMEDIATELY:  *FEVER GREATER THAN 100.5 F  *CHILLS WITH OR WITHOUT FEVER  NAUSEA AND VOMITING THAT IS NOT CONTROLLED WITH YOUR NAUSEA MEDICATION  *UNUSUAL SHORTNESS OF BREATH  *UNUSUAL BRUISING OR BLEEDING  TENDERNESS IN MOUTH AND THROAT WITH OR WITHOUT PRESENCE OF ULCERS  *URINARY PROBLEMS  *BOWEL PROBLEMS  UNUSUAL RASH Items with * indicate a potential emergency and should be followed up as soon as possible.  Feel free to call the clinic you have any questions or concerns. The clinic phone number is [phone].  Please show the CHEMO ALERT CARD at check-in to the Emergency Department and triage nurse.   

## 2016-01-01 NOTE — Telephone Encounter (Signed)
per pof to sch pt appt-gave pt copy of avs °

## 2016-01-01 NOTE — Progress Notes (Signed)
  [place] [facility] OFFICE PROGRESS NOTE   Diagnosis:  Rectal cancer  INTERVAL HISTORY:   [name] returns as scheduled. He continues every three-week Avastin and every other week Xeloda. He denies nausea/vomiting. No mouth sores. No diarrhea. No hand or foot pain or redness. No bleeding. No shortness of breath or chest pain. He has persistent numbness in the fingertips and toes.  Objective:  Vital signs in last 24 hours:  Blood pressure (!) 154/89, pulse 68, temperature 97.8 F (36.6 C), temperature source Oral, resp. rate 18, height 6' (1.829 m), weight 176 lb 9.6 oz (80.1 kg), SpO2 100 %.    HEENT: No thrush or ulcers. Resp: Lungs clear bilaterally. Cardio: Regular rate and rhythm. GI: Abdomen soft and nontender. No hepatomegaly. Vascular: No leg edema. Skin: Palms without erythema. Port-A-Cath without erythema.    Lab Results:  Lab Results  Component Value Date   WBC 5.8 01/01/2016   HGB 11.8 (L) 01/01/2016   HCT 36.5 (L) 01/01/2016   MCV 94.6 01/01/2016   PLT 230 01/01/2016   NEUTROABS 3.0 01/01/2016    Imaging:  No results found.  Medications: I have reviewed the patient's current medications.  Assessment/Plan: 1. Rectal cancer, invasive adenocarcinoma confirmed at colonoscopy 05/22/2015, EUS staging 06/01/2015 revealed a uT3,N1 lesion at 7 cm from the anal verge ? Staging CT abdomen/pelvis 05/15/2015 confirmed a mass at the rectosigmoid colon, a suspicious perirectal lymph node, and a 6 mm right liver lesion felt to most likely be a cyst ? CT chest 05/30/2015 with indeterminate pulmonary nodules and multiple liver lesions suspicious for metastases ? Ultrasound 06/13/2014 with no liver lesions identified ? MRI abdomen 06/21/2015 consistent with multiple liver metastases ? CT biopsy of liver lesion 06/26/2015. Pathology showed metastatic adenocarcinoma consistent with colonic primary. ? Cycle 1 FOLFOX 07/10/2015 ? Cycle 2 FOLFOX plus Avastin  07/24/2015 with Neulasta support ? Cycle 3 FOLFOX plus Avastin 08/07/2015 ? Cycle 4 FOLFOX plus Avastin 08/21/2015 ? cycle 5 FOLFOX plus Avastin 09/04/2015 ? Restaging CTs 09/15/2015 revealed a decrease in the size of liver lesions, decreased rectal primary, stable/decreased size of tiny lung nodules ? Cycle 6 FOLFOX plus Avastin 09/18/2015 ? Cycle 7 FOLFOX plus Avastin 10/02/2015 ? Cycle 8 FOLFOX plus Avastin 10/16/2015 ? Cycle 9 FOLFOX plus Avastin 11/06/2015 ? Cycle 10 FOLFOX plus Avastin 11/20/2015 ? CTs 12/07/2015-mild decrease in wall thickening at the rectum, decrease in tiny hepatic metastases, stable lung nodules, no evidence of progressive metastatic disease, new left lower lobe infiltrate ? Treatment changed to every three-week Avastin with every other week Xeloda beginning 12/11/2015 2. Multiple colon polyps on the colonoscopy 05/22/2015, tubular villous adenoma and a tubular adenoma were removed 3. Anemia-likely secondary to rectal bleeding.  4. Family history of multiple cancers including breast and prostate cancer 5. Transient right arm numbness 06/20/2015 6. Port-A-Cath placement 07/06/2015 by Dr. Marcello Moores 7. Mild neutropenia secondary to chemotherapy following cycle 1 FOLFOX. Neulasta added with cycle 2. 8. Delayed nausea following chemotherapy-emend added with cycle 3 FOLFOX 9. Early oxaliplatin neuropathy   Disposition: Hunter Walker appears stable. The plan is to continue every three-week Avastin and every other week Xeloda. He will return for a follow-up visit in 3 weeks. He will contact the office in the interim with any problems.    Hunter Walker ANP/GNP-BC   01/01/2016  2:21 PM

## 2016-01-05 ENCOUNTER — Other Ambulatory Visit: Payer: Self-pay | Admitting: Internal Medicine

## 2016-01-18 ENCOUNTER — Other Ambulatory Visit: Payer: Self-pay | Admitting: Oncology

## 2016-01-18 DIAGNOSIS — C20 Malignant neoplasm of rectum: Secondary | ICD-10-CM

## 2016-01-20 ENCOUNTER — Other Ambulatory Visit: Payer: Self-pay | Admitting: Internal Medicine

## 2016-01-21 ENCOUNTER — Other Ambulatory Visit: Payer: Self-pay | Admitting: Oncology

## 2016-01-22 ENCOUNTER — Ambulatory Visit: Payer: BLUE CROSS/BLUE SHIELD

## 2016-01-22 ENCOUNTER — Ambulatory Visit (HOSPITAL_BASED_OUTPATIENT_CLINIC_OR_DEPARTMENT_OTHER): Payer: BLUE CROSS/BLUE SHIELD

## 2016-01-22 ENCOUNTER — Other Ambulatory Visit: Payer: Self-pay | Admitting: *Deleted

## 2016-01-22 ENCOUNTER — Ambulatory Visit (HOSPITAL_BASED_OUTPATIENT_CLINIC_OR_DEPARTMENT_OTHER): Payer: BLUE CROSS/BLUE SHIELD | Admitting: Oncology

## 2016-01-22 ENCOUNTER — Other Ambulatory Visit (HOSPITAL_BASED_OUTPATIENT_CLINIC_OR_DEPARTMENT_OTHER): Payer: BLUE CROSS/BLUE SHIELD

## 2016-01-22 ENCOUNTER — Telehealth: Payer: Self-pay | Admitting: Oncology

## 2016-01-22 ENCOUNTER — Encounter: Payer: Self-pay | Admitting: *Deleted

## 2016-01-22 VITALS — BP 132/86 | HR 72 | Temp 98.1°F | Resp 18 | Ht 72.0 in | Wt 183.8 lb

## 2016-01-22 VITALS — BP 107/81

## 2016-01-22 DIAGNOSIS — Z5112 Encounter for antineoplastic immunotherapy: Secondary | ICD-10-CM | POA: Diagnosis not present

## 2016-01-22 DIAGNOSIS — C20 Malignant neoplasm of rectum: Secondary | ICD-10-CM

## 2016-01-22 DIAGNOSIS — G620 Drug-induced polyneuropathy: Secondary | ICD-10-CM

## 2016-01-22 DIAGNOSIS — D649 Anemia, unspecified: Secondary | ICD-10-CM | POA: Diagnosis not present

## 2016-01-22 DIAGNOSIS — C787 Secondary malignant neoplasm of liver and intrahepatic bile duct: Secondary | ICD-10-CM

## 2016-01-22 DIAGNOSIS — Z95828 Presence of other vascular implants and grafts: Secondary | ICD-10-CM

## 2016-01-22 LAB — CBC WITH DIFFERENTIAL/PLATELET
BASO%: 0.6 % (ref 0.0–2.0)
BASOS ABS: 0 10*3/uL (ref 0.0–0.1)
EOS ABS: 0.1 10*3/uL (ref 0.0–0.5)
EOS%: 1.9 % (ref 0.0–7.0)
HCT: 38.1 % — ABNORMAL LOW (ref 38.4–49.9)
HGB: 12.4 g/dL — ABNORMAL LOW (ref 13.0–17.1)
LYMPH%: 37.8 % (ref 14.0–49.0)
MCH: 31.6 pg (ref 27.2–33.4)
MCHC: 32.4 g/dL (ref 32.0–36.0)
MCV: 97.5 fL (ref 79.3–98.0)
MONO#: 0.4 10*3/uL (ref 0.1–0.9)
MONO%: 8.7 % (ref 0.0–14.0)
NEUT#: 2.5 10*3/uL (ref 1.5–6.5)
NEUT%: 51 % (ref 39.0–75.0)
PLATELETS: 213 10*3/uL (ref 140–400)
RBC: 3.9 10*6/uL — AB (ref 4.20–5.82)
RDW: 17.4 % — ABNORMAL HIGH (ref 11.0–14.6)
WBC: 5 10*3/uL (ref 4.0–10.3)
lymph#: 1.9 10*3/uL (ref 0.9–3.3)

## 2016-01-22 LAB — COMPREHENSIVE METABOLIC PANEL
ALT: 10 U/L (ref 0–55)
ANION GAP: 7 meq/L (ref 3–11)
AST: 20 U/L (ref 5–34)
Albumin: 3.5 g/dL (ref 3.5–5.0)
Alkaline Phosphatase: 74 U/L (ref 40–150)
BILIRUBIN TOTAL: 0.3 mg/dL (ref 0.20–1.20)
BUN: 14.9 mg/dL (ref 7.0–26.0)
CHLORIDE: 105 meq/L (ref 98–109)
CO2: 26 meq/L (ref 22–29)
Calcium: 9.6 mg/dL (ref 8.4–10.4)
Creatinine: 1.3 mg/dL (ref 0.7–1.3)
EGFR: 67 mL/min/{1.73_m2} — AB (ref >90)
Glucose: 84 mg/dl (ref 70–140)
Potassium: 4.3 mEq/L (ref 3.5–5.1)
Sodium: 139 mEq/L (ref 136–145)
TOTAL PROTEIN: 8.1 g/dL (ref 6.4–8.3)

## 2016-01-22 LAB — CEA (IN HOUSE-CHCC)

## 2016-01-22 LAB — UA PROTEIN, DIPSTICK - CHCC: Protein, ur: NEGATIVE mg/dL

## 2016-01-22 MED ORDER — SODIUM CHLORIDE 0.9% FLUSH
10.0000 mL | INTRAVENOUS | Status: DC | PRN
  Administered 2016-01-22: 10 mL
  Filled 2016-01-22: qty 10

## 2016-01-22 MED ORDER — HEPARIN SOD (PORK) LOCK FLUSH 100 UNIT/ML IV SOLN
500.0000 [IU] | Freq: Once | INTRAVENOUS | Status: AC | PRN
  Administered 2016-01-22: 500 [IU]
  Filled 2016-01-22: qty 5

## 2016-01-22 MED ORDER — SODIUM CHLORIDE 0.9 % IV SOLN
Freq: Once | INTRAVENOUS | Status: AC
  Administered 2016-01-22: 10:00:00 via INTRAVENOUS

## 2016-01-22 MED ORDER — SODIUM CHLORIDE 0.9 % IJ SOLN
10.0000 mL | INTRAMUSCULAR | Status: DC | PRN
  Administered 2016-01-22: 10 mL via INTRAVENOUS
  Filled 2016-01-22: qty 10

## 2016-01-22 MED ORDER — BEVACIZUMAB CHEMO INJECTION 400 MG/16ML
7.8000 mg/kg | Freq: Once | INTRAVENOUS | Status: AC
  Administered 2016-01-22: 600 mg via INTRAVENOUS
  Filled 2016-01-22: qty 16

## 2016-01-22 MED ORDER — PROCHLORPERAZINE MALEATE 10 MG PO TABS: ORAL_TABLET | ORAL | 0 refills | Status: DC

## 2016-01-22 MED ORDER — BISACODYL 5 MG PO TBEC: 5.0000 mg | DELAYED_RELEASE_TABLET | Freq: Every day | ORAL | 5 refills | Status: DC | PRN

## 2016-01-22 NOTE — Patient Instructions (Signed)
[facility] Discharge Instructions for Patients Receiving Chemotherapy  Today you received the following chemotherapy / biotherapy agents:  Avastin  To help prevent nausea and vomiting after your treatment, we encourage you to take your nausea medication as prescribed.   If you develop nausea and vomiting that is not controlled by your nausea medication, call the clinic.   BELOW ARE SYMPTOMS THAT SHOULD BE REPORTED IMMEDIATELY:  *FEVER GREATER THAN 100.5 F  *CHILLS WITH OR WITHOUT FEVER  NAUSEA AND VOMITING THAT IS NOT CONTROLLED WITH YOUR NAUSEA MEDICATION  *UNUSUAL SHORTNESS OF BREATH  *UNUSUAL BRUISING OR BLEEDING  TENDERNESS IN MOUTH AND THROAT WITH OR WITHOUT PRESENCE OF ULCERS  *URINARY PROBLEMS  *BOWEL PROBLEMS  UNUSUAL RASH Items with * indicate a potential emergency and should be followed up as soon as possible.  Feel free to call the clinic you have any questions or concerns. The clinic phone number is [phone].  Please show the [facility] at check-in to the Emergency Department and triage nurse.

## 2016-01-22 NOTE — Progress Notes (Signed)
  [place] OFFICE PROGRESS NOTE   Diagnosis: Rectal cancer  INTERVAL HISTORY:   Hunter Walker returns as scheduled. He began every other week Xeloda on 01/07/2016. No mouth sores, diarrhea, or hand/foot pain. He continues to have hand and foot numbness. Good appetite and energy level. No bleeding or symptom of thrombosis.  Objective:  Vital signs in last 24 hours:  Blood pressure 132/86, pulse 72, temperature 98.1 F (36.7 C), resp. rate 18, height 6' (1.829 m), weight 183 lb 12.8 oz (83.4 kg), SpO2 100 %.    HEENT: No thrush or ulcers Resp: Lungs clear bilaterally Cardio: Regular rate and rhythm GI: No hepatosplenomegaly, nontender Vascular: No leg edema  Skin: Palms without erythema   Portacath/PICC-without erythema  Lab Results:  Lab Results  Component Value Date   WBC 5.0 01/22/2016   HGB 12.4 (L) 01/22/2016   HCT 38.1 (L) 01/22/2016   MCV 97.5 01/22/2016   PLT 213 01/22/2016   NEUTROABS 2.5 01/22/2016     Lab Results  Component Value Date   CEA1 5.2 (H) 12/11/2015    Medications: I have reviewed the patient's current medications.  Assessment/Plan: 1. Rectal cancer, invasive adenocarcinoma confirmed at colonoscopy 05/22/2015, EUS staging 06/01/2015 revealed a uT3,N1 lesion at 7 cm from the anal verge ? Staging CT abdomen/pelvis 05/15/2015 confirmed a mass at the rectosigmoid colon, a suspicious perirectal lymph node, and a 6 mm right liver lesion felt to most likely be a cyst ? CT chest 05/30/2015 with indeterminate pulmonary nodules and multiple liver lesions suspicious for metastases ? Ultrasound 06/13/2014 with no liver lesions identified ? MRI abdomen 06/21/2015 consistent with multiple liver metastases ? CT biopsy of liver lesion 06/26/2015. Pathology showed metastatic adenocarcinoma consistent with colonic primary. ? Cycle 1 FOLFOX 07/10/2015 ? Cycle 2 FOLFOX plus Avastin 07/24/2015 with Neulasta support ? Cycle 3 FOLFOX plus Avastin  08/07/2015 ? Cycle 4 FOLFOX plus Avastin 08/21/2015 ? cycle 5 FOLFOX plus Avastin 09/04/2015 ? Restaging CTs 09/15/2015 revealed a decrease in the size of liver lesions, decreased rectal primary, stable/decreased size of tiny lung nodules ? Cycle 6 FOLFOX plus Avastin 09/18/2015 ? Cycle 7 FOLFOX plus Avastin 10/02/2015 ? Cycle 8 FOLFOX plus Avastin 10/16/2015 ? Cycle 9 FOLFOX plus Avastin 11/06/2015 ? Cycle 10 FOLFOX plus Avastin 11/20/2015 ? CTs 12/07/2015-mild decrease in wall thickening at the rectum, decrease in tiny hepatic metastases, stable lung nodules, no evidence of progressive metastatic disease, new left lower lobe infiltrate ? Treatment changed to every three-week Avastin with every other week Xeloda beginning 12/11/2015 2. Multiple colon polyps on the colonoscopy 05/22/2015, tubular villous adenoma and a tubular adenoma were removed 3. Anemia-likely secondary to rectal bleeding.  4. Family history of multiple cancers including breast and prostate cancer 5. Transient right arm numbness 06/20/2015 6. Port-A-Cath placement 07/06/2015 by Dr. Marcello Moores 7. Mild neutropenia secondary to chemotherapy following cycle 1 FOLFOX. Neulasta added with cycle 2. 8. Delayed nausea following chemotherapy-emend added with cycle 3 FOLFOX 9. Early oxaliplatin neuropathy    Disposition:  [name] appears stable. He will receive Avastin today. He will continue Xeloda on a 7 day on/7 day off schedule. He will return for an office visit in 3 weeks.  Betsy Coder, MD  01/22/2016  9:14 AM

## 2016-01-22 NOTE — Telephone Encounter (Signed)
GAVE PATIENT AVS REPORT AND APPOINTMENTS FOR September.  °

## 2016-01-22 NOTE — Progress Notes (Signed)
Oncology Nurse Navigator Documentation  Oncology Nurse Navigator Flowsheets 01/22/2016  Navigator Location [facility]  Navigator Encounter Type Treatment  Abnormal Finding Date -  Confirmed Diagnosis Date -  Treatment Initiated Date -  Patient Visit Type MedOnc  Treatment Phase Active Tx--Avastin & Xeloda  Barriers/Navigation Needs No barriers at this time;No Questions;No Needs  Interventions None required  Coordination of Care -  Support Groups/Services -  Acuity -  Time Spent with Patient 15  Tells RN that his Xeloda copay is $200 at this time. There is no copay assist at this time, but he reports his pharmacy investigates this with each reorder. Thus far, he says he can manage the cost, since he can make payments.

## 2016-01-23 LAB — CEA: CEA: 4.2 ng/mL (ref 0.0–4.7)

## 2016-02-11 ENCOUNTER — Other Ambulatory Visit: Payer: Self-pay | Admitting: Oncology

## 2016-02-12 ENCOUNTER — Telehealth: Payer: Self-pay | Admitting: Oncology

## 2016-02-12 ENCOUNTER — Ambulatory Visit: Payer: BLUE CROSS/BLUE SHIELD

## 2016-02-12 ENCOUNTER — Other Ambulatory Visit (HOSPITAL_BASED_OUTPATIENT_CLINIC_OR_DEPARTMENT_OTHER): Payer: BLUE CROSS/BLUE SHIELD

## 2016-02-12 ENCOUNTER — Ambulatory Visit (HOSPITAL_BASED_OUTPATIENT_CLINIC_OR_DEPARTMENT_OTHER): Payer: BLUE CROSS/BLUE SHIELD | Admitting: Nurse Practitioner

## 2016-02-12 ENCOUNTER — Ambulatory Visit (HOSPITAL_BASED_OUTPATIENT_CLINIC_OR_DEPARTMENT_OTHER): Payer: BLUE CROSS/BLUE SHIELD

## 2016-02-12 VITALS — BP 125/79 | HR 63

## 2016-02-12 VITALS — BP 137/88 | HR 65 | Temp 98.7°F | Resp 18 | Wt 186.1 lb

## 2016-02-12 DIAGNOSIS — Z5112 Encounter for antineoplastic immunotherapy: Secondary | ICD-10-CM | POA: Diagnosis not present

## 2016-02-12 DIAGNOSIS — Z95828 Presence of other vascular implants and grafts: Secondary | ICD-10-CM

## 2016-02-12 DIAGNOSIS — G620 Drug-induced polyneuropathy: Secondary | ICD-10-CM | POA: Diagnosis not present

## 2016-02-12 DIAGNOSIS — C20 Malignant neoplasm of rectum: Secondary | ICD-10-CM

## 2016-02-12 DIAGNOSIS — C787 Secondary malignant neoplasm of liver and intrahepatic bile duct: Secondary | ICD-10-CM

## 2016-02-12 DIAGNOSIS — D649 Anemia, unspecified: Secondary | ICD-10-CM

## 2016-02-12 LAB — CBC WITH DIFFERENTIAL/PLATELET
BASO%: 0.8 % (ref 0.0–2.0)
Basophils Absolute: 0 10*3/uL (ref 0.0–0.1)
EOS ABS: 0.1 10*3/uL (ref 0.0–0.5)
EOS%: 2 % (ref 0.0–7.0)
HCT: 37.8 % — ABNORMAL LOW (ref 38.4–49.9)
HEMOGLOBIN: 12.4 g/dL — AB (ref 13.0–17.1)
LYMPH%: 47 % (ref 14.0–49.0)
MCH: 32.4 pg (ref 27.2–33.4)
MCHC: 32.8 g/dL (ref 32.0–36.0)
MCV: 98.7 fL — AB (ref 79.3–98.0)
MONO#: 0.4 10*3/uL (ref 0.1–0.9)
MONO%: 11.8 % (ref 0.0–14.0)
NEUT%: 38.4 % — ABNORMAL LOW (ref 39.0–75.0)
NEUTROS ABS: 1.4 10*3/uL — AB (ref 1.5–6.5)
PLATELETS: 184 10*3/uL (ref 140–400)
RBC: 3.83 10*6/uL — ABNORMAL LOW (ref 4.20–5.82)
RDW: 16.8 % — AB (ref 11.0–14.6)
WBC: 3.6 10*3/uL — AB (ref 4.0–10.3)
lymph#: 1.7 10*3/uL (ref 0.9–3.3)

## 2016-02-12 LAB — COMPREHENSIVE METABOLIC PANEL
ALBUMIN: 3.5 g/dL (ref 3.5–5.0)
ALK PHOS: 66 U/L (ref 40–150)
ALT: 12 U/L (ref 0–55)
ANION GAP: 8 meq/L (ref 3–11)
AST: 21 U/L (ref 5–34)
BILIRUBIN TOTAL: 0.41 mg/dL (ref 0.20–1.20)
BUN: 13 mg/dL (ref 7.0–26.0)
CALCIUM: 9.3 mg/dL (ref 8.4–10.4)
CO2: 26 mEq/L (ref 22–29)
Chloride: 106 mEq/L (ref 98–109)
Creatinine: 1.3 mg/dL (ref 0.7–1.3)
EGFR: 67 mL/min/{1.73_m2} — AB (ref >90)
Glucose: 84 mg/dl (ref 70–140)
Potassium: 4.4 mEq/L (ref 3.5–5.1)
Sodium: 140 mEq/L (ref 136–145)
TOTAL PROTEIN: 7.9 g/dL (ref 6.4–8.3)

## 2016-02-12 LAB — UA PROTEIN, DIPSTICK - CHCC: Protein, ur: NEGATIVE mg/dL

## 2016-02-12 MED ORDER — SODIUM CHLORIDE 0.9 % IV SOLN
Freq: Once | INTRAVENOUS | Status: AC
  Administered 2016-02-12: 10:00:00 via INTRAVENOUS

## 2016-02-12 MED ORDER — SODIUM CHLORIDE 0.9% FLUSH
10.0000 mL | INTRAVENOUS | Status: DC | PRN
  Administered 2016-02-12: 10 mL
  Filled 2016-02-12: qty 10

## 2016-02-12 MED ORDER — SODIUM CHLORIDE 0.9 % IJ SOLN
10.0000 mL | INTRAMUSCULAR | Status: DC | PRN
  Administered 2016-02-12: 10 mL via INTRAVENOUS
  Filled 2016-02-12: qty 10

## 2016-02-12 MED ORDER — SODIUM CHLORIDE 0.9 % IV SOLN
7.8000 mg/kg | Freq: Once | INTRAVENOUS | Status: AC
  Administered 2016-02-12: 600 mg via INTRAVENOUS
  Filled 2016-02-12: qty 16

## 2016-02-12 MED ORDER — HEPARIN SOD (PORK) LOCK FLUSH 100 UNIT/ML IV SOLN
500.0000 [IU] | Freq: Once | INTRAVENOUS | Status: AC | PRN
  Administered 2016-02-12: 500 [IU]
  Filled 2016-02-12: qty 5

## 2016-02-12 NOTE — Patient Instructions (Signed)
[place] Discharge Instructions for Patients Receiving Chemotherapy  Today you received the following chemotherapy / biotherapy agents:  Avastin  To help prevent nausea and vomiting after your treatment, we encourage you to take your nausea medication as prescribed.   If you develop nausea and vomiting that is not controlled by your nausea medication, call the clinic.   BELOW ARE SYMPTOMS THAT SHOULD BE REPORTED IMMEDIATELY:  *FEVER GREATER THAN 100.5 F  *CHILLS WITH OR WITHOUT FEVER  NAUSEA AND VOMITING THAT IS NOT CONTROLLED WITH YOUR NAUSEA MEDICATION  *UNUSUAL SHORTNESS OF BREATH  *UNUSUAL BRUISING OR BLEEDING  TENDERNESS IN MOUTH AND THROAT WITH OR WITHOUT PRESENCE OF ULCERS  *URINARY PROBLEMS  *BOWEL PROBLEMS  UNUSUAL RASH Items with * indicate a potential emergency and should be followed up as soon as possible.  Feel free to call the clinic you have any questions or concerns. The clinic phone number is [phone].  Please show the [place] at check-in to the Emergency Department and triage nurse.

## 2016-02-12 NOTE — Telephone Encounter (Signed)
Appointments complete per 9/11 los. Patient to get print out in inf area.

## 2016-02-12 NOTE — Patient Instructions (Signed)

## 2016-02-12 NOTE — Progress Notes (Signed)
[place] OFFICE PROGRESS NOTE   Diagnosis:  Rectal cancer  INTERVAL HISTORY:   Hunter Walker returns as scheduled. He continues Environmental health practitioner. He denies nausea/vomiting. No mouth sores. No diarrhea. No redness or pain involving the hands or feet. He has persistent numbness in the hands and feet. He denies any bleeding. No shortness of breath or chest pain. No leg swelling or calf pain. No abdominal pain.  Objective:  Vital signs in last 24 hours:  Blood pressure 137/88, pulse 65, temperature 98.7 F (37.1 C), temperature source Oral, resp. rate 18, weight 186 lb 1.6 oz (84.4 kg), SpO2 100 %.    HEENT: No thrush or ulcers. Resp: Lungs clear bilaterally. Cardio: Regular rate and rhythm. GI: Abdomen soft and nontender. No hepatomegaly. Vascular: No leg edema. Port-A-Cath without erythema.   Lab Results:  Lab Results  Component Value Date   WBC 3.6 (L) 02/12/2016   HGB 12.4 (L) 02/12/2016   HCT 37.8 (L) 02/12/2016   MCV 98.7 (H) 02/12/2016   PLT 184 02/12/2016   NEUTROABS 1.4 (L) 02/12/2016    Imaging:  No results found.  Medications: I have reviewed the patient's current medications.  Assessment/Plan: 1. Rectal cancer, invasive adenocarcinoma confirmed at colonoscopy 05/22/2015, EUS staging 06/01/2015 revealed a uT3,N1 lesion at 7 cm from the anal verge ? Staging CT abdomen/pelvis 05/15/2015 confirmed a mass at the rectosigmoid colon, a suspicious perirectal lymph node, and a 6 mm right liver lesion felt to most likely be a cyst ? CT chest 05/30/2015 with indeterminate pulmonary nodules and multiple liver lesions suspicious for metastases ? Ultrasound 06/13/2014 with no liver lesions identified ? MRI abdomen 06/21/2015 consistent with multiple liver metastases ? CT biopsy of liver lesion 06/26/2015. Pathology showed metastatic adenocarcinoma consistent with colonic primary. ? Cycle 1 FOLFOX 07/10/2015 ? Cycle 2 FOLFOX plus Avastin 07/24/2015 with  Neulasta support ? Cycle 3 FOLFOX plus Avastin 08/07/2015 ? Cycle 4 FOLFOX plus Avastin 08/21/2015 ? cycle 5 FOLFOX plus Avastin 09/04/2015 ? Restaging CTs 09/15/2015 revealed a decrease in the size of liver lesions, decreased rectal primary, stable/decreased size of tiny lung nodules ? Cycle 6 FOLFOX plus Avastin 09/18/2015 ? Cycle 7 FOLFOX plus Avastin 10/02/2015 ? Cycle 8 FOLFOX plus Avastin 10/16/2015 ? Cycle 9 FOLFOX plus Avastin 11/06/2015 ? Cycle 10 FOLFOX plus Avastin 11/20/2015 ? CTs 12/07/2015-mild decrease in wall thickening at the rectum, decrease in tiny hepatic metastases, stable lung nodules, no evidence of progressive metastatic disease, new left lower lobe infiltrate ? Treatment changed to every three-week Avastin with every other week Xeloda beginning 12/11/2015 2. Multiple colon polyps on the colonoscopy 05/22/2015, tubular villous adenoma and a tubular adenoma were removed 3. Anemia-likely secondary to rectal bleeding.  4. Family history of multiple cancers including breast and prostate cancer 5. Transient right arm numbness 06/20/2015 6. Port-A-Cath placement 07/06/2015 by Dr. Marcello Moores 7. Mild neutropenia secondary to chemotherapy following cycle 1 FOLFOX. Neulasta added with cycle 2. 8. Delayed nausea following chemotherapy-emend added with cycle 3 FOLFOX 9. Early oxaliplatin neuropathy   Disposition: [name] appears stable. Plan to continue every other week Xeloda and every 3 week Avastin.  The neutrophil count is mildly decreased. He understands to contact the office with fever, chills, other signs of infection.  He will return for a follow-up visit in 3 weeks. He will contact the office in the interim as outlined above or with any other problems.  Plan reviewed with Dr. Benay Spice.    Ned Card ANP/GNP-BC   02/12/2016  10:04 AM

## 2016-02-20 ENCOUNTER — Other Ambulatory Visit: Payer: Self-pay | Admitting: Pharmacist

## 2016-02-20 NOTE — Telephone Encounter (Signed)
Oral Chemotherapy Pharmacist Encounter   Received call from [facility] for refill request for capecitabine 500mg  tablets. Per 9/11 MD note, pt to continue on treatment, 9/11 labs ok for treatment. Next capecitabine fill initiated with no refills.  Johny Drilling, PharmD, BCPS 02/20/2016  3:52 PM Oral Chemotherapy Clinic [phone]

## 2016-02-22 ENCOUNTER — Encounter: Payer: Self-pay | Admitting: *Deleted

## 2016-02-22 NOTE — Progress Notes (Addendum)
Oncology Nurse Navigator Documentation  Oncology Nurse Navigator Flowsheets 02/22/2016  Navigator Location [facility]  Navigator Encounter Type Treatment  Abnormal Finding Date -  Confirmed Diagnosis Date -  Treatment Initiated Date -  Patient Visit Type MedOnc  Treatment Phase Active Tx--Avastin/Xeloda  Barriers/Navigation Needs No barriers at this time;No Questions;No Needs  Interventions None required  Coordination of Care -  Support Groups/Services -  Acuity -  Time Spent with Patient 15  Feeling well and eating well. Reports no adverse effect from chemotherapy. Expresses that he is disappointed that his hair is coming out. Reassured him that it is not permanent--will start to grow back after chemo is stopped.

## 2016-03-03 ENCOUNTER — Other Ambulatory Visit: Payer: Self-pay | Admitting: Oncology

## 2016-03-04 ENCOUNTER — Other Ambulatory Visit (HOSPITAL_BASED_OUTPATIENT_CLINIC_OR_DEPARTMENT_OTHER): Payer: BLUE CROSS/BLUE SHIELD

## 2016-03-04 ENCOUNTER — Telehealth: Payer: Self-pay | Admitting: *Deleted

## 2016-03-04 ENCOUNTER — Telehealth: Payer: Self-pay | Admitting: Oncology

## 2016-03-04 ENCOUNTER — Other Ambulatory Visit: Payer: Self-pay | Admitting: Internal Medicine

## 2016-03-04 ENCOUNTER — Ambulatory Visit (HOSPITAL_BASED_OUTPATIENT_CLINIC_OR_DEPARTMENT_OTHER): Payer: BLUE CROSS/BLUE SHIELD

## 2016-03-04 ENCOUNTER — Ambulatory Visit: Payer: BLUE CROSS/BLUE SHIELD

## 2016-03-04 ENCOUNTER — Ambulatory Visit (HOSPITAL_BASED_OUTPATIENT_CLINIC_OR_DEPARTMENT_OTHER): Payer: BLUE CROSS/BLUE SHIELD | Admitting: Oncology

## 2016-03-04 ENCOUNTER — Other Ambulatory Visit: Payer: Self-pay | Admitting: *Deleted

## 2016-03-04 VITALS — BP 147/78 | HR 66 | Temp 97.8°F | Resp 17 | Ht 72.0 in | Wt 188.7 lb

## 2016-03-04 VITALS — BP 143/90 | HR 76

## 2016-03-04 DIAGNOSIS — C20 Malignant neoplasm of rectum: Secondary | ICD-10-CM

## 2016-03-04 DIAGNOSIS — G620 Drug-induced polyneuropathy: Secondary | ICD-10-CM

## 2016-03-04 DIAGNOSIS — C787 Secondary malignant neoplasm of liver and intrahepatic bile duct: Secondary | ICD-10-CM

## 2016-03-04 DIAGNOSIS — Z95828 Presence of other vascular implants and grafts: Secondary | ICD-10-CM

## 2016-03-04 DIAGNOSIS — Z5112 Encounter for antineoplastic immunotherapy: Secondary | ICD-10-CM | POA: Diagnosis not present

## 2016-03-04 DIAGNOSIS — Z23 Encounter for immunization: Secondary | ICD-10-CM | POA: Diagnosis not present

## 2016-03-04 LAB — CBC WITH DIFFERENTIAL/PLATELET
BASO%: 0.5 % (ref 0.0–2.0)
BASOS ABS: 0 10*3/uL (ref 0.0–0.1)
EOS ABS: 0.1 10*3/uL (ref 0.0–0.5)
EOS%: 3.5 % (ref 0.0–7.0)
HEMATOCRIT: 39.8 % (ref 38.4–49.9)
HEMOGLOBIN: 13.2 g/dL (ref 13.0–17.1)
LYMPH#: 1.8 10*3/uL (ref 0.9–3.3)
LYMPH%: 43.9 % (ref 14.0–49.0)
MCH: 33.3 pg (ref 27.2–33.4)
MCHC: 33.2 g/dL (ref 32.0–36.0)
MCV: 100.5 fL — AB (ref 79.3–98.0)
MONO#: 0.4 10*3/uL (ref 0.1–0.9)
MONO%: 10.7 % (ref 0.0–14.0)
NEUT%: 41.4 % (ref 39.0–75.0)
NEUTROS ABS: 1.7 10*3/uL (ref 1.5–6.5)
Platelets: 189 10*3/uL (ref 140–400)
RBC: 3.96 10*6/uL — ABNORMAL LOW (ref 4.20–5.82)
RDW: 17.1 % — AB (ref 11.0–14.6)
WBC: 4 10*3/uL (ref 4.0–10.3)

## 2016-03-04 LAB — COMPREHENSIVE METABOLIC PANEL
ALBUMIN: 3.6 g/dL (ref 3.5–5.0)
ALK PHOS: 69 U/L (ref 40–150)
ALT: 12 U/L (ref 0–55)
AST: 22 U/L (ref 5–34)
Anion Gap: 10 mEq/L (ref 3–11)
BILIRUBIN TOTAL: 0.47 mg/dL (ref 0.20–1.20)
BUN: 12.4 mg/dL (ref 7.0–26.0)
CALCIUM: 9.4 mg/dL (ref 8.4–10.4)
CO2: 24 mEq/L (ref 22–29)
Chloride: 107 mEq/L (ref 98–109)
Creatinine: 1.2 mg/dL (ref 0.7–1.3)
EGFR: 73 mL/min/{1.73_m2} — AB (ref >90)
GLUCOSE: 77 mg/dL (ref 70–140)
POTASSIUM: 4.2 meq/L (ref 3.5–5.1)
SODIUM: 141 meq/L (ref 136–145)
TOTAL PROTEIN: 8 g/dL (ref 6.4–8.3)

## 2016-03-04 LAB — CEA (IN HOUSE-CHCC): CEA (CHCC-In House): 1.93 ng/mL (ref 0.00–5.00)

## 2016-03-04 LAB — UA PROTEIN, DIPSTICK - CHCC: PROTEIN: NEGATIVE mg/dL

## 2016-03-04 MED ORDER — SODIUM CHLORIDE 0.9% FLUSH
10.0000 mL | INTRAVENOUS | Status: DC | PRN
  Administered 2016-03-04: 10 mL
  Filled 2016-03-04: qty 10

## 2016-03-04 MED ORDER — SODIUM CHLORIDE 0.9 % IV SOLN
Freq: Once | INTRAVENOUS | Status: AC
  Administered 2016-03-04: 11:00:00 via INTRAVENOUS

## 2016-03-04 MED ORDER — SODIUM CHLORIDE 0.9 % IV SOLN: Freq: Once | INTRAVENOUS | Status: DC

## 2016-03-04 MED ORDER — INFLUENZA VAC SPLIT QUAD 0.5 ML IM SUSY
0.5000 mL | PREFILLED_SYRINGE | Freq: Once | INTRAMUSCULAR | Status: AC
  Administered 2016-03-04: 0.5 mL via INTRAMUSCULAR
  Filled 2016-03-04: qty 0.5

## 2016-03-04 MED ORDER — HEPARIN SOD (PORK) LOCK FLUSH 100 UNIT/ML IV SOLN
500.0000 [IU] | Freq: Once | INTRAVENOUS | Status: AC | PRN
  Administered 2016-03-04: 500 [IU] via INTRAVENOUS
  Filled 2016-03-04: qty 5

## 2016-03-04 MED ORDER — SODIUM CHLORIDE 0.9 % IV SOLN
Freq: Once | INTRAVENOUS | Status: AC
  Administered 2016-03-04: 11:00:00 via INTRAVENOUS
  Filled 2016-03-04: qty 4

## 2016-03-04 MED ORDER — PROCHLORPERAZINE MALEATE 10 MG PO TABS: ORAL_TABLET | ORAL | 0 refills | Status: DC

## 2016-03-04 MED ORDER — HEPARIN SOD (PORK) LOCK FLUSH 100 UNIT/ML IV SOLN
500.0000 [IU] | Freq: Once | INTRAVENOUS | Status: AC | PRN
  Administered 2016-03-04: 500 [IU]
  Filled 2016-03-04: qty 5

## 2016-03-04 MED ORDER — SODIUM CHLORIDE 0.9 % IV SOLN
7.6000 mg/kg | Freq: Once | INTRAVENOUS | Status: AC
  Administered 2016-03-04: 600 mg via INTRAVENOUS
  Filled 2016-03-04: qty 16

## 2016-03-04 MED ORDER — SODIUM CHLORIDE 0.9 % IJ SOLN
10.0000 mL | INTRAMUSCULAR | Status: DC | PRN
  Administered 2016-03-04: 10 mL via INTRAVENOUS
  Filled 2016-03-04: qty 10

## 2016-03-04 NOTE — Progress Notes (Signed)
  [place] OFFICE PROGRESS NOTE   Diagnosis: Rectal cancer  INTERVAL HISTORY:   Mr. Blower returns as scheduled. He continues Environmental health practitioner. No mouth sores, diarrhea, or hand/foot pain. No bleeding or symptom of thrombosis. He feels well. He started another cycle of Xeloda on 03/03/2016.  Objective:  Vital signs in last 24 hours:  Blood pressure (!) 147/78, pulse 66, temperature 97.8 F (36.6 C), temperature source Oral, resp. rate 17, height 6' (1.829 m), weight 188 lb 11.2 oz (85.6 kg), SpO2 100 %.    HEENT: No thrush or ulcers Resp: Lungs clear bilaterally Cardio: Regular rate and rhythm GI: No hepatomegaly, no mass, nontender, mildly distended Vascular: No leg edema Skin: Hyperpigmentation of the palms and soles. Dry desquamation at the soles. No erythema.  Portacath/PICC-without erythema  Lab Results:  Lab Results  Component Value Date   WBC 4.0 03/04/2016   HGB 13.2 03/04/2016   HCT 39.8 03/04/2016   MCV 100.5 (H) 03/04/2016   PLT 189 03/04/2016   NEUTROABS 1.7 03/04/2016     Lab Results  Component Value Date   CEA1 4.2 01/22/2016     Medications: I have reviewed the patient's current medications.  Assessment/Plan: 1. Rectal cancer, invasive adenocarcinoma confirmed at colonoscopy 05/22/2015, EUS staging 06/01/2015 revealed a uT3,N1 lesion at 7 cm from the anal verge ? Staging CT abdomen/pelvis 05/15/2015 confirmed a mass at the rectosigmoid colon, a suspicious perirectal lymph node, and a 6 mm right liver lesion felt to most likely be a cyst ? CT chest 05/30/2015 with indeterminate pulmonary nodules and multiple liver lesions suspicious for metastases ? Ultrasound 06/13/2014 with no liver lesions identified ? MRI abdomen 06/21/2015 consistent with multiple liver metastases ? CT biopsy of liver lesion 06/26/2015. Pathology showed metastatic adenocarcinoma consistent with colonic primary. ? Cycle 1 FOLFOX 07/10/2015 ? Cycle 2 FOLFOX  plus Avastin 07/24/2015 with Neulasta support ? Cycle 3 FOLFOX plus Avastin 08/07/2015 ? Cycle 4 FOLFOX plus Avastin 08/21/2015 ? cycle 5 FOLFOX plus Avastin 09/04/2015 ? Restaging CTs 09/15/2015 revealed a decrease in the size of liver lesions, decreased rectal primary, stable/decreased size of tiny lung nodules ? Cycle 6 FOLFOX plus Avastin 09/18/2015 ? Cycle 7 FOLFOX plus Avastin 10/02/2015 ? Cycle 8 FOLFOX plus Avastin 10/16/2015 ? Cycle 9 FOLFOX plus Avastin 11/06/2015 ? Cycle 10 FOLFOX plus Avastin 11/20/2015 ? CTs 12/07/2015-mild decrease in wall thickening at the rectum, decrease in tiny hepatic metastases, stable lung nodules, no evidence of progressive metastatic disease, new left lower lobe infiltrate ? Treatment changed to every three-week Avastin with every other week Xeloda beginning 12/11/2015 2. Multiple colon polyps on the colonoscopy 05/22/2015, tubular villous adenoma and a tubular adenoma were removed 3. Anemia-likely secondary to rectal bleeding.  4. Family history of multiple cancers including breast and prostate cancer 5. Transient right arm numbness 06/20/2015 6. Port-A-Cath placement 07/06/2015 by Dr. Marcello Moores 7. Mild neutropenia secondary to chemotherapy following cycle 1 FOLFOX. Neulasta added with cycle 2. 8. Delayed nausea following chemotherapy-emend added with cycle 3 FOLFOX 9. Early oxaliplatin neuropathy     Disposition:  Mr. Callon appears unchanged. He will complete another treatment with Avastin today. He continues every other week Xeloda. He will receive an influenza vaccine today.  Mr. Guimond will return for an office visit and Avastin in 3 weeks. We will plan for a restaging CT evaluation after the next office visit.  Betsy Coder, MD  03/04/2016  9:36 AM

## 2016-03-04 NOTE — Telephone Encounter (Signed)
Per LOS I have scheduled appts and notified the scheduler 

## 2016-03-04 NOTE — Telephone Encounter (Signed)
Message sent to infusion scheduler to add chemo. Avs report and appointment schedule given to patient per 03/04/16 los.

## 2016-03-04 NOTE — Patient Instructions (Signed)
[place] Discharge Instructions for Patients Receiving Chemotherapy  Today you received the following chemotherapy agents Avastin  To help prevent nausea and vomiting after your treatment, we encourage you to take your nausea medication    If you develop nausea and vomiting that is not controlled by your nausea medication, call the clinic.   BELOW ARE SYMPTOMS THAT SHOULD BE REPORTED IMMEDIATELY:  *FEVER GREATER THAN 100.5 F  *CHILLS WITH OR WITHOUT FEVER  NAUSEA AND VOMITING THAT IS NOT CONTROLLED WITH YOUR NAUSEA MEDICATION  *UNUSUAL SHORTNESS OF BREATH  *UNUSUAL BRUISING OR BLEEDING  TENDERNESS IN MOUTH AND THROAT WITH OR WITHOUT PRESENCE OF ULCERS  *URINARY PROBLEMS  *BOWEL PROBLEMS  UNUSUAL RASH Items with * indicate a potential emergency and should be followed up as soon as possible.  Feel free to call the clinic you have any questions or concerns. The clinic phone number is [phone].  Please show the [place] at check-in to the Emergency Department and triage nurse.   Influenza Virus Vaccine injection What is this medicine? INFLUENZA VIRUS VACCINE (in floo EN zuh VAHY ruhs vak SEEN) helps to reduce the risk of getting influenza also known as the flu. The vaccine only helps protect you against some strains of the flu. This medicine may be used for other purposes; ask your health care provider or pharmacist if you have questions. What should I tell my health care provider before I take this medicine? They need to know if you have any of these conditions: -bleeding disorder like hemophilia -fever or infection -Guillain-Barre syndrome or other neurological problems -immune system problems -infection with the human immunodeficiency virus (HIV) or AIDS -low blood platelet counts -multiple sclerosis -an unusual or allergic reaction to influenza virus vaccine, latex, other medicines, foods, dyes, or preservatives. Different brands of  vaccines contain different allergens. Some may contain latex or eggs. Talk to your doctor about your allergies to make sure that you get the right vaccine. -pregnant or trying to get pregnant -breast-feeding How should I use this medicine? This vaccine is for injection into a muscle or under the skin. It is given by a health care professional. A copy of Vaccine Information Statements will be given before each vaccination. Read this sheet carefully each time. The sheet may change frequently. Talk to your healthcare provider to see which vaccines are right for you. Some vaccines should not be used in all age groups. Overdosage: If you think you have taken too much of this medicine contact a poison control center or emergency room at once. NOTE: This medicine is only for you. Do not share this medicine with others. What if I miss a dose? This does not apply. What may interact with this medicine? -chemotherapy or radiation therapy -medicines that lower your immune system like etanercept, anakinra, infliximab, and adalimumab -medicines that treat or prevent blood clots like warfarin -phenytoin -steroid medicines like prednisone or cortisone -theophylline -vaccines This list may not describe all possible interactions. Give your health care provider a list of all the medicines, herbs, non-prescription drugs, or dietary supplements you use. Also tell them if you smoke, drink alcohol, or use illegal drugs. Some items may interact with your medicine. What should I watch for while using this medicine? Report any side effects that do not go away within 3 days to your doctor or health care professional. Call your health care provider if any unusual symptoms occur within 6 weeks of receiving this vaccine. You may still catch  the flu, but the illness is not usually as bad. You cannot get the flu from the vaccine. The vaccine will not protect against colds or other illnesses that may cause fever. The vaccine  is needed every year. What side effects may I notice from receiving this medicine? Side effects that you should report to your doctor or health care professional as soon as possible: -allergic reactions like skin rash, itching or hives, swelling of the face, lips, or tongue Side effects that usually do not require medical attention (report to your doctor or health care professional if they continue or are bothersome): -fever -headache -muscle aches and pains -pain, tenderness, redness, or swelling at the injection site -tiredness This list may not describe all possible side effects. Call your doctor for medical advice about side effects. You may report side effects to FDA at 1-800-FDA-1088. Where should I keep my medicine? The vaccine will be given by a health care professional in a clinic, pharmacy, doctor's office, or other health care setting. You will not be given vaccine doses to store at home. NOTE: This sheet is a summary. It may not cover all possible information. If you have questions about this medicine, talk to your doctor, pharmacist, or health care provider.    2016, [employer]vier/[employer] Standard. (2014-12-09 10:07:28)

## 2016-03-05 ENCOUNTER — Other Ambulatory Visit: Payer: Self-pay | Admitting: *Deleted

## 2016-03-05 DIAGNOSIS — C20 Malignant neoplasm of rectum: Secondary | ICD-10-CM

## 2016-03-05 LAB — CEA: CEA1: 4 ng/mL (ref 0.0–4.7)

## 2016-03-05 MED ORDER — CAPECITABINE 500 MG PO TABS: ORAL_TABLET | ORAL | 0 refills | Status: DC

## 2016-03-24 ENCOUNTER — Other Ambulatory Visit: Payer: Self-pay | Admitting: Oncology

## 2016-03-25 ENCOUNTER — Other Ambulatory Visit (HOSPITAL_BASED_OUTPATIENT_CLINIC_OR_DEPARTMENT_OTHER): Payer: BLUE CROSS/BLUE SHIELD

## 2016-03-25 ENCOUNTER — Ambulatory Visit (HOSPITAL_BASED_OUTPATIENT_CLINIC_OR_DEPARTMENT_OTHER): Payer: BLUE CROSS/BLUE SHIELD

## 2016-03-25 ENCOUNTER — Ambulatory Visit: Payer: BLUE CROSS/BLUE SHIELD

## 2016-03-25 ENCOUNTER — Telehealth: Payer: Self-pay | Admitting: Nurse Practitioner

## 2016-03-25 ENCOUNTER — Ambulatory Visit (HOSPITAL_BASED_OUTPATIENT_CLINIC_OR_DEPARTMENT_OTHER): Payer: BLUE CROSS/BLUE SHIELD | Admitting: Nurse Practitioner

## 2016-03-25 VITALS — BP 139/84 | HR 61 | Temp 98.4°F | Resp 17 | Ht 72.0 in | Wt 188.2 lb

## 2016-03-25 DIAGNOSIS — Z95828 Presence of other vascular implants and grafts: Secondary | ICD-10-CM

## 2016-03-25 DIAGNOSIS — C20 Malignant neoplasm of rectum: Secondary | ICD-10-CM

## 2016-03-25 DIAGNOSIS — D649 Anemia, unspecified: Secondary | ICD-10-CM | POA: Diagnosis not present

## 2016-03-25 DIAGNOSIS — C787 Secondary malignant neoplasm of liver and intrahepatic bile duct: Secondary | ICD-10-CM | POA: Diagnosis not present

## 2016-03-25 DIAGNOSIS — G620 Drug-induced polyneuropathy: Secondary | ICD-10-CM

## 2016-03-25 DIAGNOSIS — Z5112 Encounter for antineoplastic immunotherapy: Secondary | ICD-10-CM

## 2016-03-25 LAB — CBC WITH DIFFERENTIAL/PLATELET
BASO%: 0.4 % (ref 0.0–2.0)
BASOS ABS: 0 10*3/uL (ref 0.0–0.1)
EOS%: 2.2 % (ref 0.0–7.0)
Eosinophils Absolute: 0.1 10*3/uL (ref 0.0–0.5)
HEMATOCRIT: 39.4 % (ref 38.4–49.9)
HGB: 13.1 g/dL (ref 13.0–17.1)
LYMPH%: 51.8 % — ABNORMAL HIGH (ref 14.0–49.0)
MCH: 33.2 pg (ref 27.2–33.4)
MCHC: 33.2 g/dL (ref 32.0–36.0)
MCV: 99.7 fL — AB (ref 79.3–98.0)
MONO#: 0.4 10*3/uL (ref 0.1–0.9)
MONO%: 9.5 % (ref 0.0–14.0)
NEUT#: 1.6 10*3/uL (ref 1.5–6.5)
NEUT%: 36.1 % — AB (ref 39.0–75.0)
Platelets: 168 10*3/uL (ref 140–400)
RBC: 3.95 10*6/uL — ABNORMAL LOW (ref 4.20–5.82)
RDW: 17.3 % — ABNORMAL HIGH (ref 11.0–14.6)
WBC: 4.5 10*3/uL (ref 4.0–10.3)
lymph#: 2.3 10*3/uL (ref 0.9–3.3)

## 2016-03-25 LAB — COMPREHENSIVE METABOLIC PANEL
ALT: 11 U/L (ref 0–55)
AST: 19 U/L (ref 5–34)
Albumin: 3.6 g/dL (ref 3.5–5.0)
Alkaline Phosphatase: 63 U/L (ref 40–150)
Anion Gap: 8 mEq/L (ref 3–11)
BUN: 15.4 mg/dL (ref 7.0–26.0)
CALCIUM: 9.1 mg/dL (ref 8.4–10.4)
CHLORIDE: 106 meq/L (ref 98–109)
CO2: 25 mEq/L (ref 22–29)
Creatinine: 1.2 mg/dL (ref 0.7–1.3)
EGFR: 77 mL/min/{1.73_m2} — AB (ref >90)
Glucose: 98 mg/dl (ref 70–140)
POTASSIUM: 4.1 meq/L (ref 3.5–5.1)
SODIUM: 139 meq/L (ref 136–145)
Total Bilirubin: 0.44 mg/dL (ref 0.20–1.20)
Total Protein: 8 g/dL (ref 6.4–8.3)

## 2016-03-25 LAB — CEA (IN HOUSE-CHCC)

## 2016-03-25 MED ORDER — SODIUM CHLORIDE 0.9% FLUSH
10.0000 mL | INTRAVENOUS | Status: DC | PRN
Start: 2016-03-25 — End: 2016-03-25
  Administered 2016-03-25: 10 mL
  Filled 2016-03-25: qty 10

## 2016-03-25 MED ORDER — SODIUM CHLORIDE 0.9 % IV SOLN
7.8000 mg/kg | Freq: Once | INTRAVENOUS | Status: AC
  Administered 2016-03-25: 600 mg via INTRAVENOUS
  Filled 2016-03-25: qty 16

## 2016-03-25 MED ORDER — SODIUM CHLORIDE 0.9 % IJ SOLN
10.0000 mL | INTRAMUSCULAR | Status: DC | PRN
  Administered 2016-03-25: 10 mL via INTRAVENOUS
  Filled 2016-03-25: qty 10

## 2016-03-25 MED ORDER — HEPARIN SOD (PORK) LOCK FLUSH 100 UNIT/ML IV SOLN
500.0000 [IU] | Freq: Once | INTRAVENOUS | Status: AC | PRN
Start: 2016-03-25 — End: 2016-03-25
  Administered 2016-03-25: 500 [IU]
  Filled 2016-03-25: qty 5

## 2016-03-25 MED ORDER — SODIUM CHLORIDE 0.9 % IV SOLN
Freq: Once | INTRAVENOUS | Status: AC
Start: 2016-03-25 — End: 2016-03-25
  Administered 2016-03-25: 13:00:00 via INTRAVENOUS

## 2016-03-25 NOTE — Telephone Encounter (Signed)
Confirmed appt, received avs, contrast was given for CT scan

## 2016-03-25 NOTE — Progress Notes (Signed)
  [place] OFFICE PROGRESS NOTE   Diagnosis:  Rectal cancer  INTERVAL HISTORY:   Hunter Walker returns as scheduled. He continues Environmental health practitioner. He has occasional mild nausea. No mouth sores. No diarrhea. No hand or foot pain or redness. He has occasional mild gum bleeding. No other bleeding. He denies shortness of breath and chest pain. No leg swelling or calf pain.  Objective:  Vital signs in last 24 hours:  Blood pressure 139/84, pulse 61, temperature 98.4 F (36.9 C), temperature source Oral, resp. rate 17, height 6' (1.829 m), weight 188 lb 3.2 oz (85.4 kg), SpO2 100 %.    HEENT: No thrush or ulcers. Resp: Lungs clear bilaterally. Cardio: Regular rate and rhythm. GI: Abdomen soft and nontender. No hepatomegaly. Vascular: No leg edema. Skin: Palms with mild dryness, hyperpigmentation.  Port-A-Cath without erythema.  Lab Results:  Lab Results  Component Value Date   WBC 4.5 03/25/2016   HGB 13.1 03/25/2016   HCT 39.4 03/25/2016   MCV 99.7 (H) 03/25/2016   PLT 168 03/25/2016   NEUTROABS 1.6 03/25/2016    Imaging:  No results found.  Medications: I have reviewed the patient's current medications.  Assessment/Plan: 1. Rectal cancer, invasive adenocarcinoma confirmed at colonoscopy 05/22/2015, EUS staging 06/01/2015 revealed a uT3,N1 lesion at 7 cm from the anal verge ? Staging CT abdomen/pelvis 05/15/2015 confirmed a mass at the rectosigmoid colon, a suspicious perirectal lymph node, and a 6 mm right liver lesion felt to most likely be a cyst ? CT chest 05/30/2015 with indeterminate pulmonary nodules and multiple liver lesions suspicious for metastases ? Ultrasound 06/13/2014 with no liver lesions identified ? MRI abdomen 06/21/2015 consistent with multiple liver metastases ? CT biopsy of liver lesion 06/26/2015. Pathology showed metastatic adenocarcinoma consistent with colonic primary. ? Cycle 1 FOLFOX 07/10/2015 ? Cycle 2 FOLFOX plus Avastin  07/24/2015 with Neulasta support ? Cycle 3 FOLFOX plus Avastin 08/07/2015 ? Cycle 4 FOLFOX plus Avastin 08/21/2015 ? cycle 5 FOLFOX plus Avastin 09/04/2015 ? Restaging CTs 09/15/2015 revealed a decrease in the size of liver lesions, decreased rectal primary, stable/decreased size of tiny lung nodules ? Cycle 6 FOLFOX plus Avastin 09/18/2015 ? Cycle 7 FOLFOX plus Avastin 10/02/2015 ? Cycle 8 FOLFOX plus Avastin 10/16/2015 ? Cycle 9 FOLFOX plus Avastin 11/06/2015 ? Cycle 10 FOLFOX plus Avastin 11/20/2015 ? CTs 12/07/2015-mild decrease in wall thickening at the rectum, decrease in tiny hepatic metastases, stable lung nodules, no evidence of progressive metastatic disease, new left lower lobe infiltrate ? Treatment changed to every three-week Avastin with every other week Xeloda beginning 12/11/2015 2. Multiple colon polyps on the colonoscopy 05/22/2015, tubular villous adenoma and a tubular adenoma were removed 3. Anemia-likely secondary to rectal bleeding.  4. Family history of multiple cancers including breast and prostate cancer 5. Transient right arm numbness 06/20/2015 6. Port-A-Cath placement 07/06/2015 by Dr. Marcello Moores 7. Mild neutropenia secondary to chemotherapy following cycle 1 FOLFOX. Neulasta added with cycle 2. 8. Delayed nausea following chemotherapy-emend added with cycle 3 FOLFOX 9. Early oxaliplatin neuropathy   Disposition: Hunter Walker appears stable. He feels well. Plan to continue every other week Xeloda and every three-week Avastin. Restaging scans to prior to his next visit in 3 weeks.  He will return for a follow-up visit 04/15/2016. He will contact the office in the interim with any problems.    Ned Card ANP/GNP-BC   03/25/2016  11:44 AM

## 2016-03-25 NOTE — Patient Instructions (Signed)
[facility] Discharge Instructions for Patients Receiving Chemotherapy  Today you received the following chemotherapy agents Avastin   To help prevent nausea and vomiting after your treatment, we encourage you to take your nausea medication as directed.    If you develop nausea and vomiting that is not controlled by your nausea medication, call the clinic.   BELOW ARE SYMPTOMS THAT SHOULD BE REPORTED IMMEDIATELY:  *FEVER GREATER THAN 100.5 F  *CHILLS WITH OR WITHOUT FEVER  NAUSEA AND VOMITING THAT IS NOT CONTROLLED WITH YOUR NAUSEA MEDICATION  *UNUSUAL SHORTNESS OF BREATH  *UNUSUAL BRUISING OR BLEEDING  TENDERNESS IN MOUTH AND THROAT WITH OR WITHOUT PRESENCE OF ULCERS  *URINARY PROBLEMS  *BOWEL PROBLEMS  UNUSUAL RASH Items with * indicate a potential emergency and should be followed up as soon as possible.  Feel free to call the clinic you have any questions or concerns. The clinic phone number is [phone].  Please show the CHEMO ALERT CARD at check-in to the Emergency Department and triage nurse.   

## 2016-04-12 ENCOUNTER — Ambulatory Visit (HOSPITAL_COMMUNITY)
Admission: RE | Admit: 2016-04-12 | Discharge: 2016-04-12 | Disposition: A | Discharge status: Home / Self Care | Payer: BLUE CROSS/BLUE SHIELD | Source: Ambulatory Visit | Attending: Nurse Practitioner | Admitting: Nurse Practitioner

## 2016-04-12 DIAGNOSIS — C20 Malignant neoplasm of rectum: Secondary | ICD-10-CM | POA: Diagnosis present

## 2016-04-12 DIAGNOSIS — R918 Other nonspecific abnormal finding of lung field: Secondary | ICD-10-CM | POA: Diagnosis not present

## 2016-04-12 MED ORDER — IOPAMIDOL (ISOVUE-300) INJECTION 61%
100.0000 mL | Freq: Once | INTRAVENOUS | Status: AC | PRN
  Administered 2016-04-12: 100 mL via INTRAVENOUS

## 2016-04-14 ENCOUNTER — Other Ambulatory Visit: Payer: Self-pay | Admitting: Oncology

## 2016-04-15 ENCOUNTER — Other Ambulatory Visit (HOSPITAL_BASED_OUTPATIENT_CLINIC_OR_DEPARTMENT_OTHER): Payer: BLUE CROSS/BLUE SHIELD

## 2016-04-15 ENCOUNTER — Ambulatory Visit: Payer: BLUE CROSS/BLUE SHIELD

## 2016-04-15 ENCOUNTER — Ambulatory Visit (HOSPITAL_BASED_OUTPATIENT_CLINIC_OR_DEPARTMENT_OTHER): Payer: BLUE CROSS/BLUE SHIELD

## 2016-04-15 ENCOUNTER — Telehealth: Payer: Self-pay | Admitting: Oncology

## 2016-04-15 ENCOUNTER — Ambulatory Visit (HOSPITAL_BASED_OUTPATIENT_CLINIC_OR_DEPARTMENT_OTHER): Payer: BLUE CROSS/BLUE SHIELD | Admitting: Nurse Practitioner

## 2016-04-15 VITALS — BP 143/92 | HR 82 | Temp 99.2°F | Resp 16 | Ht 72.0 in | Wt 186.5 lb

## 2016-04-15 DIAGNOSIS — C20 Malignant neoplasm of rectum: Secondary | ICD-10-CM

## 2016-04-15 DIAGNOSIS — C787 Secondary malignant neoplasm of liver and intrahepatic bile duct: Secondary | ICD-10-CM

## 2016-04-15 DIAGNOSIS — Z5112 Encounter for antineoplastic immunotherapy: Secondary | ICD-10-CM

## 2016-04-15 DIAGNOSIS — Z95828 Presence of other vascular implants and grafts: Secondary | ICD-10-CM

## 2016-04-15 DIAGNOSIS — D630 Anemia in neoplastic disease: Secondary | ICD-10-CM | POA: Diagnosis not present

## 2016-04-15 DIAGNOSIS — G620 Drug-induced polyneuropathy: Secondary | ICD-10-CM | POA: Diagnosis not present

## 2016-04-15 LAB — CEA (IN HOUSE-CHCC)

## 2016-04-15 LAB — CBC WITH DIFFERENTIAL/PLATELET
BASO%: 0.3 % (ref 0.0–2.0)
Basophils Absolute: 0 10*3/uL (ref 0.0–0.1)
EOS ABS: 0.1 10*3/uL (ref 0.0–0.5)
EOS%: 1.3 % (ref 0.0–7.0)
HEMATOCRIT: 42.5 % (ref 38.4–49.9)
HGB: 14.1 g/dL (ref 13.0–17.1)
LYMPH#: 2.4 10*3/uL (ref 0.9–3.3)
LYMPH%: 29.6 % (ref 14.0–49.0)
MCH: 34.1 pg — ABNORMAL HIGH (ref 27.2–33.4)
MCHC: 33.1 g/dL (ref 32.0–36.0)
MCV: 102.8 fL — AB (ref 79.3–98.0)
MONO#: 0.6 10*3/uL (ref 0.1–0.9)
MONO%: 7.4 % (ref 0.0–14.0)
NEUT#: 5 10*3/uL (ref 1.5–6.5)
NEUT%: 61.4 % (ref 39.0–75.0)
PLATELETS: 230 10*3/uL (ref 140–400)
RBC: 4.14 10*6/uL — ABNORMAL LOW (ref 4.20–5.82)
RDW: 20.1 % — ABNORMAL HIGH (ref 11.0–14.6)
WBC: 8.1 10*3/uL (ref 4.0–10.3)

## 2016-04-15 LAB — COMPREHENSIVE METABOLIC PANEL
ALT: 11 U/L (ref 0–55)
ANION GAP: 10 meq/L (ref 3–11)
AST: 20 U/L (ref 5–34)
Albumin: 3.7 g/dL (ref 3.5–5.0)
Alkaline Phosphatase: 69 U/L (ref 40–150)
BILIRUBIN TOTAL: 0.55 mg/dL (ref 0.20–1.20)
BUN: 14.2 mg/dL (ref 7.0–26.0)
CALCIUM: 9.9 mg/dL (ref 8.4–10.4)
CHLORIDE: 105 meq/L (ref 98–109)
CO2: 24 mEq/L (ref 22–29)
CREATININE: 1.4 mg/dL — AB (ref 0.7–1.3)
EGFR: 60 mL/min/{1.73_m2} — AB (ref >90)
Glucose: 97 mg/dl (ref 70–140)
Potassium: 4.4 mEq/L (ref 3.5–5.1)
Sodium: 139 mEq/L (ref 136–145)
TOTAL PROTEIN: 8.6 g/dL — AB (ref 6.4–8.3)

## 2016-04-15 LAB — UA PROTEIN, DIPSTICK - CHCC: PROTEIN: NEGATIVE mg/dL

## 2016-04-15 MED ORDER — SODIUM CHLORIDE 0.9 % IV SOLN
Freq: Once | INTRAVENOUS | Status: AC
  Administered 2016-04-15: 13:00:00 via INTRAVENOUS

## 2016-04-15 MED ORDER — PROCHLORPERAZINE MALEATE 10 MG PO TABS: ORAL_TABLET | ORAL | 2 refills | Status: DC

## 2016-04-15 MED ORDER — BEVACIZUMAB CHEMO INJECTION 400 MG/16ML
7.6000 mg/kg | Freq: Once | INTRAVENOUS | Status: AC
  Administered 2016-04-15: 600 mg via INTRAVENOUS
  Filled 2016-04-15: qty 16

## 2016-04-15 MED ORDER — SODIUM CHLORIDE 0.9% FLUSH
10.0000 mL | INTRAVENOUS | Status: DC | PRN
  Administered 2016-04-15: 10 mL
  Filled 2016-04-15: qty 10

## 2016-04-15 MED ORDER — HEPARIN SOD (PORK) LOCK FLUSH 100 UNIT/ML IV SOLN
500.0000 [IU] | Freq: Once | INTRAVENOUS | Status: AC | PRN
  Administered 2016-04-15: 500 [IU] via INTRAVENOUS
  Filled 2016-04-15: qty 5

## 2016-04-15 MED ORDER — HEPARIN SOD (PORK) LOCK FLUSH 100 UNIT/ML IV SOLN
500.0000 [IU] | Freq: Once | INTRAVENOUS | Status: AC | PRN
  Administered 2016-04-15: 500 [IU]
  Filled 2016-04-15: qty 5

## 2016-04-15 MED ORDER — SODIUM CHLORIDE 0.9 % IJ SOLN
10.0000 mL | INTRAMUSCULAR | Status: DC | PRN
  Administered 2016-04-15: 10 mL via INTRAVENOUS
  Filled 2016-04-15: qty 10

## 2016-04-15 NOTE — Progress Notes (Addendum)
[place] OFFICE PROGRESS NOTE   Diagnosis:  Rectal cancer  INTERVAL HISTORY:   Mr. Rohn returns as scheduled. He continues Environmental health practitioner. He denies nausea/vomiting. No diarrhea. Over the past weekend he noted soreness at the anterior palate. He has been more fatigued. No hand or foot pain or redness. He denies any bleeding. No shortness of breath or chest pain.  Objective:  Vital signs in last 24 hours:  Blood pressure (!) 143/92, pulse 82, temperature 99.2 F (37.3 C), temperature source Oral, resp. rate 16, height 6' (1.829 m), weight 186 lb 8 oz (84.6 kg), SpO2 100 %.    HEENT: No thrush or ulcers. Resp: Lungs clear bilaterally. Cardio: Regular rate and rhythm. GI: Abdomen soft and nontender. No hepatomegaly. Vascular: No leg edema. Port-A-Cath without erythema.  Lab Results:  Lab Results  Component Value Date   WBC 8.1 04/15/2016   HGB 14.1 04/15/2016   HCT 42.5 04/15/2016   MCV 102.8 (H) 04/15/2016   PLT 230 04/15/2016   NEUTROABS 5.0 04/15/2016    Imaging:  No results found.  Medications: I have reviewed the patient's current medications.  Assessment/Plan: 1. Rectal cancer, invasive adenocarcinoma confirmed at colonoscopy 05/22/2015, EUS staging 06/01/2015 revealed a uT3,N1 lesion at 7 cm from the anal verge ? Staging CT abdomen/pelvis 05/15/2015 confirmed a mass at the rectosigmoid colon, a suspicious perirectal lymph node, and a 6 mm right liver lesion felt to most likely be a cyst ? CT chest 05/30/2015 with indeterminate pulmonary nodules and multiple liver lesions suspicious for metastases ? Ultrasound 06/13/2014 with no liver lesions identified ? MRI abdomen 06/21/2015 consistent with multiple liver metastases ? CT biopsy of liver lesion 06/26/2015. Pathology showed metastatic adenocarcinoma consistent with colonic primary. ? Cycle 1 FOLFOX 07/10/2015 ? Cycle 2 FOLFOX plus Avastin 07/24/2015 with Neulasta support ? Cycle 3 FOLFOX  plus Avastin 08/07/2015 ? Cycle 4 FOLFOX plus Avastin 08/21/2015 ? cycle 5 FOLFOX plus Avastin 09/04/2015 ? Restaging CTs 09/15/2015 revealed a decrease in the size of liver lesions, decreased rectal primary, stable/decreased size of tiny lung nodules ? Cycle 6 FOLFOX plus Avastin 09/18/2015 ? Cycle 7 FOLFOX plus Avastin 10/02/2015 ? Cycle 8 FOLFOX plus Avastin 10/16/2015 ? Cycle 9 FOLFOX plus Avastin 11/06/2015 ? Cycle 10 FOLFOX plus Avastin 11/20/2015 ? CTs 12/07/2015-mild decrease in wall thickening at the rectum, decrease in tiny hepatic metastases, stable lung nodules, no evidence of progressive metastatic disease, new left lower lobe infiltrate ? Treatment changed to every three-week Avastin with every other week Xeloda beginning 12/11/2015 ? Restaging CTs 04/12/2016-interval increase in size of adjacent right upper lobe pulmonary nodules and right lower lobe pulmonary nodule; unchanged 5 mm lesion right hepatic lobe; no additional visualized hepatic metastasis. Grossly unchanged wall thickening of the rectum. 2. Multiple colon polyps on the colonoscopy 05/22/2015, tubular villous adenoma and a tubular adenoma were removed 3. Anemia-likely secondary to rectal bleeding.  4. Family history of multiple cancers including breast and prostate cancer 5. Transient right arm numbness 06/20/2015 6. Port-A-Cath placement 07/06/2015 by Dr. Marcello Moores 7. Mild neutropenia secondary to chemotherapy following cycle 1 FOLFOX. Neulasta added with cycle 2. 8. Delayed nausea following chemotherapy-emend added with cycle 3 FOLFOX 9. Early oxaliplatin neuropathy   Disposition: Mr. Barfuss appears stable. The recent restaging CT scans showed overall stable disease with minimal increase in the size of a right upper lobe lung nodule. Dr. Benay Spice recommends continuation of every other week Xeloda and every three-week Avastin. Mr. Moskos is in agreement with this  plan.  He complained of mouth soreness at today's  visit. There is no evidence of mucositis on exam. He will continue Xeloda and contact the office if the mouth soreness worsens or he develops ulcerations.  He will return for a follow-up visit in 3 weeks. He will contact the office in the interim with any problems.  Patient seen with Dr. Benay Spice. CT report/images reviewed on the computer by Dr. Benay Spice with Mr. Honeck. 25 minutes were spent face-to-face at today's visit with the majority of that time involved in counseling/coordination of care.    Ned Card ANP/GNP-BC   04/15/2016  12:15 PM  This was a shared visit with Ned Card. Mr. Daluz appears well. We reviewed the CT images with him. A few of the lung nodules appear slightly larger, but remain indeterminate. The plan is to continue Xeloda/Avastin. He will return for an office visit in 3 weeks.  Julieanne Manson, M.D.

## 2016-04-15 NOTE — Telephone Encounter (Signed)
Gave patient avs report and appointments for December  °

## 2016-04-15 NOTE — Patient Instructions (Signed)
[facility] Discharge Instructions for Patients Receiving Chemotherapy  Today you received the following chemotherapy agents Avastin   To help prevent nausea and vomiting after your treatment, we encourage you to take your nausea medication as directed.    If you develop nausea and vomiting that is not controlled by your nausea medication, call the clinic.   BELOW ARE SYMPTOMS THAT SHOULD BE REPORTED IMMEDIATELY:  *FEVER GREATER THAN 100.5 F  *CHILLS WITH OR WITHOUT FEVER  NAUSEA AND VOMITING THAT IS NOT CONTROLLED WITH YOUR NAUSEA MEDICATION  *UNUSUAL SHORTNESS OF BREATH  *UNUSUAL BRUISING OR BLEEDING  TENDERNESS IN MOUTH AND THROAT WITH OR WITHOUT PRESENCE OF ULCERS  *URINARY PROBLEMS  *BOWEL PROBLEMS  UNUSUAL RASH Items with * indicate a potential emergency and should be followed up as soon as possible.  Feel free to call the clinic you have any questions or concerns. The clinic phone number is [phone].  Please show the CHEMO ALERT CARD at check-in to the Emergency Department and triage nurse.   

## 2016-04-17 ENCOUNTER — Telehealth: Payer: Self-pay | Admitting: *Deleted

## 2016-04-17 MED ORDER — MAGIC MOUTHWASH: 5.0000 mL | Freq: Four times a day (QID) | ORAL | 1 refills | Status: DC | PRN

## 2016-04-17 NOTE — Telephone Encounter (Signed)
Message from pt reporting "boil" in mouth isn't improving, swells up at night. Continues saltwater rinses with no relief. Requesting prescription.  Discussed with Ned Card, NP: Order received for pt to HOLD Xeloda x1 week. OK to try magic mouthwash.  Called pt with above instructions. He resumed Xeloda on 04/14/16.  He will discontinue Xeloda today and call us next week with an update on mouth soreness. MMW script called to pharmacy.

## 2016-05-03 ENCOUNTER — Other Ambulatory Visit: Payer: Self-pay | Admitting: Medical Oncology

## 2016-05-03 DIAGNOSIS — C20 Malignant neoplasm of rectum: Secondary | ICD-10-CM

## 2016-05-03 MED ORDER — CAPECITABINE 500 MG PO TABS: ORAL_TABLET | ORAL | 1 refills | Status: DC

## 2016-05-03 NOTE — Progress Notes (Signed)
xeloda rx faxed to [facility].

## 2016-05-05 ENCOUNTER — Other Ambulatory Visit: Payer: Self-pay | Admitting: Oncology

## 2016-05-06 ENCOUNTER — Ambulatory Visit (HOSPITAL_BASED_OUTPATIENT_CLINIC_OR_DEPARTMENT_OTHER): Payer: BLUE CROSS/BLUE SHIELD

## 2016-05-06 ENCOUNTER — Telehealth: Payer: Self-pay | Admitting: Nurse Practitioner

## 2016-05-06 ENCOUNTER — Ambulatory Visit: Payer: BLUE CROSS/BLUE SHIELD

## 2016-05-06 ENCOUNTER — Ambulatory Visit (HOSPITAL_BASED_OUTPATIENT_CLINIC_OR_DEPARTMENT_OTHER): Payer: BLUE CROSS/BLUE SHIELD | Admitting: Nurse Practitioner

## 2016-05-06 ENCOUNTER — Other Ambulatory Visit (HOSPITAL_BASED_OUTPATIENT_CLINIC_OR_DEPARTMENT_OTHER): Payer: BLUE CROSS/BLUE SHIELD

## 2016-05-06 VITALS — BP 126/84 | HR 74

## 2016-05-06 VITALS — BP 144/92 | HR 68 | Temp 98.8°F | Resp 16 | Ht 72.0 in | Wt 183.3 lb

## 2016-05-06 DIAGNOSIS — C20 Malignant neoplasm of rectum: Secondary | ICD-10-CM

## 2016-05-06 DIAGNOSIS — C787 Secondary malignant neoplasm of liver and intrahepatic bile duct: Secondary | ICD-10-CM

## 2016-05-06 DIAGNOSIS — Z5112 Encounter for antineoplastic immunotherapy: Secondary | ICD-10-CM | POA: Diagnosis not present

## 2016-05-06 DIAGNOSIS — G620 Drug-induced polyneuropathy: Secondary | ICD-10-CM | POA: Diagnosis not present

## 2016-05-06 DIAGNOSIS — L271 Localized skin eruption due to drugs and medicaments taken internally: Secondary | ICD-10-CM | POA: Diagnosis not present

## 2016-05-06 DIAGNOSIS — Z95828 Presence of other vascular implants and grafts: Secondary | ICD-10-CM

## 2016-05-06 LAB — CBC WITH DIFFERENTIAL/PLATELET
BASO%: 0.4 % (ref 0.0–2.0)
BASOS ABS: 0 10*3/uL (ref 0.0–0.1)
EOS%: 1.2 % (ref 0.0–7.0)
Eosinophils Absolute: 0.1 10*3/uL (ref 0.0–0.5)
HEMATOCRIT: 41 % (ref 38.4–49.9)
HEMOGLOBIN: 13.6 g/dL (ref 13.0–17.1)
LYMPH#: 2.6 10*3/uL (ref 0.9–3.3)
LYMPH%: 38.2 % (ref 14.0–49.0)
MCH: 33.9 pg — ABNORMAL HIGH (ref 27.2–33.4)
MCHC: 33.2 g/dL (ref 32.0–36.0)
MCV: 102.2 fL — ABNORMAL HIGH (ref 79.3–98.0)
MONO#: 0.5 10*3/uL (ref 0.1–0.9)
MONO%: 8.1 % (ref 0.0–14.0)
NEUT#: 3.5 10*3/uL (ref 1.5–6.5)
NEUT%: 52.1 % (ref 39.0–75.0)
PLATELETS: 187 10*3/uL (ref 140–400)
RBC: 4.01 10*6/uL — ABNORMAL LOW (ref 4.20–5.82)
RDW: 17 % — ABNORMAL HIGH (ref 11.0–14.6)
WBC: 6.7 10*3/uL (ref 4.0–10.3)

## 2016-05-06 LAB — UA PROTEIN, DIPSTICK - CHCC: PROTEIN: NEGATIVE mg/dL

## 2016-05-06 LAB — COMPREHENSIVE METABOLIC PANEL
ALBUMIN: 3.7 g/dL (ref 3.5–5.0)
ALK PHOS: 73 U/L (ref 40–150)
ALT: 10 U/L (ref 0–55)
ANION GAP: 10 meq/L (ref 3–11)
AST: 21 U/L (ref 5–34)
BILIRUBIN TOTAL: 0.73 mg/dL (ref 0.20–1.20)
BUN: 14.9 mg/dL (ref 7.0–26.0)
CALCIUM: 9.4 mg/dL (ref 8.4–10.4)
CHLORIDE: 104 meq/L (ref 98–109)
CO2: 23 mEq/L (ref 22–29)
CREATININE: 1.4 mg/dL — AB (ref 0.7–1.3)
EGFR: 62 mL/min/{1.73_m2} — ABNORMAL LOW (ref >90)
Glucose: 98 mg/dl (ref 70–140)
Potassium: 4.2 mEq/L (ref 3.5–5.1)
Sodium: 137 mEq/L (ref 136–145)
TOTAL PROTEIN: 8.4 g/dL — AB (ref 6.4–8.3)

## 2016-05-06 LAB — CEA (IN HOUSE-CHCC)

## 2016-05-06 MED ORDER — SODIUM CHLORIDE 0.9 % IV SOLN
Freq: Once | INTRAVENOUS | Status: AC
  Administered 2016-05-06: 15:00:00 via INTRAVENOUS

## 2016-05-06 MED ORDER — BEVACIZUMAB CHEMO INJECTION 400 MG/16ML
7.8000 mg/kg | Freq: Once | INTRAVENOUS | Status: AC
  Administered 2016-05-06: 600 mg via INTRAVENOUS
  Filled 2016-05-06: qty 16

## 2016-05-06 MED ORDER — HEPARIN SOD (PORK) LOCK FLUSH 100 UNIT/ML IV SOLN
500.0000 [IU] | Freq: Once | INTRAVENOUS | Status: AC | PRN
  Administered 2016-05-06: 500 [IU]
  Filled 2016-05-06: qty 5

## 2016-05-06 MED ORDER — SODIUM CHLORIDE 0.9 % IJ SOLN
10.0000 mL | INTRAMUSCULAR | Status: DC | PRN
  Administered 2016-05-06: 10 mL via INTRAVENOUS
  Filled 2016-05-06: qty 10

## 2016-05-06 MED ORDER — SODIUM CHLORIDE 0.9% FLUSH
10.0000 mL | INTRAVENOUS | Status: DC | PRN
  Administered 2016-05-06: 10 mL
  Filled 2016-05-06: qty 10

## 2016-05-06 NOTE — Patient Instructions (Signed)
[facility] Discharge Instructions for Patients Receiving Chemotherapy  Today you received the following chemotherapy agents Avastin   To help prevent nausea and vomiting after your treatment, we encourage you to take your nausea medication as directed.    If you develop nausea and vomiting that is not controlled by your nausea medication, call the clinic.   BELOW ARE SYMPTOMS THAT SHOULD BE REPORTED IMMEDIATELY:  *FEVER GREATER THAN 100.5 F  *CHILLS WITH OR WITHOUT FEVER  NAUSEA AND VOMITING THAT IS NOT CONTROLLED WITH YOUR NAUSEA MEDICATION  *UNUSUAL SHORTNESS OF BREATH  *UNUSUAL BRUISING OR BLEEDING  TENDERNESS IN MOUTH AND THROAT WITH OR WITHOUT PRESENCE OF ULCERS  *URINARY PROBLEMS  *BOWEL PROBLEMS  UNUSUAL RASH Items with * indicate a potential emergency and should be followed up as soon as possible.  Feel free to call the clinic you have any questions or concerns. The clinic phone number is [phone].  Please show the CHEMO ALERT CARD at check-in to the Emergency Department and triage nurse.   

## 2016-05-06 NOTE — Progress Notes (Addendum)
[place] OFFICE PROGRESS NOTE   Diagnosis:  Rectal cancer  INTERVAL HISTORY:   Hunter Walker returns as scheduled. He continues Xeloda 7 days on/7 days off/Avastin every 3 weeks. He resumed Xeloda yesterday. He had an episode of nausea yesterday. Previous mouth "blister" has resolved. Palms and soles are "sore and swollen". Overall he feels weak. No shortness of breath or chest pain. No leg swelling. No bleeding.  Objective:  Vital signs in last 24 hours:  Blood pressure (!) 144/92, pulse 68, temperature 98.8 F (37.1 C), temperature source Oral, resp. rate 16, height 6' (1.829 m), weight 183 lb 4.8 oz (83.1 kg), SpO2 99 %.     HEENT: No thrush or ulcers. Resp: Lungs clear bilaterally. Cardio: Regular rate and rhythm. GI: Abdomen soft and nontender. No hepatomegaly. Vascular: No leg edema. Skin: Palms with mild erythema, dryness. Soles with marked dry desquamation at the heels bilaterally.  Port-A-Cath without erythema.  Lab Results:  Lab Results  Component Value Date   WBC 6.7 05/06/2016   HGB 13.6 05/06/2016   HCT 41.0 05/06/2016   MCV 102.2 (H) 05/06/2016   PLT 187 05/06/2016   NEUTROABS 3.5 05/06/2016    Imaging:  No results found.  Medications: I have reviewed the patient's current medications.  Assessment/Plan: 1. Rectal cancer, invasive adenocarcinoma confirmed at colonoscopy 05/22/2015, EUS staging 06/01/2015 revealed a uT3,N1 lesion at 7 cm from the anal verge ? Staging CT abdomen/pelvis 05/15/2015 confirmed a mass at the rectosigmoid colon, a suspicious perirectal lymph node, and a 6 mm right liver lesion felt to most likely be a cyst ? CT chest 05/30/2015 with indeterminate pulmonary nodules and multiple liver lesions suspicious for metastases ? Ultrasound 06/13/2014 with no liver lesions identified ? MRI abdomen 06/21/2015 consistent with multiple liver metastases ? CT biopsy of liver lesion 06/26/2015. Pathology showed metastatic  adenocarcinoma consistent with colonic primary. ? Cycle 1 FOLFOX 07/10/2015 ? Cycle 2 FOLFOX plus Avastin 07/24/2015 with Neulasta support ? Cycle 3 FOLFOX plus Avastin 08/07/2015 ? Cycle 4 FOLFOX plus Avastin 08/21/2015 ? cycle 5 FOLFOX plus Avastin 09/04/2015 ? Restaging CTs 09/15/2015 revealed a decrease in the size of liver lesions, decreased rectal primary, stable/decreased size of tiny lung nodules ? Cycle 6 FOLFOX plus Avastin 09/18/2015 ? Cycle 7 FOLFOX plus Avastin 10/02/2015 ? Cycle 8 FOLFOX plus Avastin 10/16/2015 ? Cycle 9 FOLFOX plus Avastin 11/06/2015 ? Cycle 10 FOLFOX plus Avastin 11/20/2015 ? CTs 12/07/2015-mild decrease in wall thickening at the rectum, decrease in tiny hepatic metastases, stable lung nodules, no evidence of progressive metastatic disease, new left lower lobe infiltrate ? Treatment changed to every three-week Avastin with every other week Xeloda beginning 12/11/2015 ? Restaging CTs 04/12/2016-interval increase in size of adjacent right upper lobe pulmonary nodules and right lower lobe pulmonary nodule; unchanged 5 mm lesion right hepatic lobe; no additional visualized hepatic metastasis. Grossly unchanged wall thickening of the rectum. ? Continuation of Xeloda every other week and every three-week Avastin 2. Multiple colon polyps on the colonoscopy 05/22/2015, tubular villous adenoma and a tubular adenoma were removed 3. Anemia-likely secondary to rectal bleeding.  4. Family history of multiple cancers including breast and prostate cancer 5. Transient right arm numbness 06/20/2015 6. Port-A-Cath placement 07/06/2015 by Dr. Marcello Moores 7. Mild neutropenia secondary to chemotherapy following cycle 1 FOLFOX. Neulasta added with cycle 2. 8. Delayed nausea following chemotherapy-emend added with cycle 3 FOLFOX 9. Early oxaliplatin neuropathy 10. Hand-foot syndrome secondary to Xeloda 05/06/2016   Disposition: Hunter Walker has  developed hand-foot syndrome related to  Xeloda. We placed Xeloda on hold. Plan to proceed with Avastin today as scheduled. He will return for a follow-up visit in one week for reevaluation/decision on when to resume Xeloda and at what dose. He will contact the office in the interim with any problems.  Patient seen with Dr. Benay Spice.    Ned Card ANP/GNP-BC   05/06/2016  2:40 PM  This was a shared visit with Ned Card. Hunter Walker was interviewed and examined. Xeloda will be placed on hold secondary to progressive hand-foot syndrome. Julieanne Manson, M.D.

## 2016-05-06 NOTE — Telephone Encounter (Signed)
Appointments scheduled per 12/4 LOS. Patient given AVS report and calendars with future scheduled appointments.  °

## 2016-05-06 NOTE — Patient Instructions (Signed)

## 2016-05-13 ENCOUNTER — Ambulatory Visit (HOSPITAL_BASED_OUTPATIENT_CLINIC_OR_DEPARTMENT_OTHER): Payer: BLUE CROSS/BLUE SHIELD | Admitting: Nurse Practitioner

## 2016-05-13 VITALS — BP 140/85 | HR 84 | Temp 98.2°F | Resp 18 | Ht 72.0 in | Wt 183.8 lb

## 2016-05-13 DIAGNOSIS — C20 Malignant neoplasm of rectum: Secondary | ICD-10-CM | POA: Diagnosis not present

## 2016-05-13 DIAGNOSIS — L271 Localized skin eruption due to drugs and medicaments taken internally: Secondary | ICD-10-CM

## 2016-05-13 DIAGNOSIS — D630 Anemia in neoplastic disease: Secondary | ICD-10-CM | POA: Diagnosis not present

## 2016-05-13 DIAGNOSIS — G620 Drug-induced polyneuropathy: Secondary | ICD-10-CM | POA: Diagnosis not present

## 2016-05-13 DIAGNOSIS — C787 Secondary malignant neoplasm of liver and intrahepatic bile duct: Secondary | ICD-10-CM

## 2016-05-13 NOTE — Progress Notes (Addendum)
**Hunter [name]** Hunter Hunter Walker   Diagnosis:  Rectal cancer  INTERVAL HISTORY:   Hunter Hunter Walker returns as scheduled. Following his last visit 05/06/2016 Xeloda was placed on hold due to hand-foot syndrome. He received Avastin as scheduled. He has intermittent "shooting pain" involving the left hand. This mainly occurs at nighttime. Feet remain numb. Wrist feel "weak". He feels like both hands are swollen, left hand more so than the right. He continues to Hunter Walker dry skin on the palms and soles.  Objective:  Vital signs in last 24 hours:  Blood pressure 140/85, pulse 84, temperature 98.2 F (36.8 C), temperature source Oral, resp. rate 18, height 6' (1.829 m), weight 183 lb 12.8 oz (83.4 kg), SpO2 100 %.    HEENT: No thrush or ulcers. Resp: Lungs clear bilaterally. Cardio: Regular rate and rhythm. GI: Abdomen soft and nontender. No hepatomegaly. Vascular: No leg edema. Neuro: Wrist strength appears intact bilaterally. Musculoskeletal: Left hand/fingers appear mildly edematous. There is no arm edema. Skin: Palms with mild erythema and mild dryness. Soles with marked dry desquamation at the heels bilaterally. Port-A-Cath without erythema.    Lab Results:  Lab Results  Component Value Date   WBC 6.7 05/06/2016   HGB 13.6 05/06/2016   HCT 41.0 05/06/2016   MCV 102.2 (H) 05/06/2016   PLT 187 05/06/2016   NEUTROABS 3.5 05/06/2016    Imaging:  No results found.  Medications: I have reviewed the patient's current medications.  Assessment/Plan: 1. Rectal cancer, invasive adenocarcinoma confirmed at colonoscopy 05/22/2015, EUS staging 06/01/2015 revealed a uT3,N1 lesion at 7 cm from the anal verge ? Staging CT abdomen/pelvis 05/15/2015 confirmed a mass at the rectosigmoid colon, a suspicious perirectal lymph node, and a 6 mm right liver lesion felt to most likely be a cyst ? CT chest 05/30/2015 with indeterminate pulmonary nodules and multiple liver lesions  suspicious for metastases ? Ultrasound 06/13/2014 with no liver lesions identified ? MRI abdomen 06/21/2015 consistent with multiple liver metastases ? CT biopsy of liver lesion 06/26/2015. Pathology showed metastatic adenocarcinoma consistent with colonic primary. ? Cycle 1 FOLFOX 07/10/2015 ? Cycle 2 FOLFOX plus Avastin 07/24/2015 with Neulasta support ? Cycle 3 FOLFOX plus Avastin 08/07/2015 ? Cycle 4 FOLFOX plus Avastin 08/21/2015 ? cycle 5 FOLFOX plus Avastin 09/04/2015 ? Restaging CTs 09/15/2015 revealed a decrease in the size of liver lesions, decreased rectal primary, stable/decreased size of tiny lung nodules ? Cycle 6 FOLFOX plus Avastin 09/18/2015 ? Cycle 7 FOLFOX plus Avastin 10/02/2015 ? Cycle 8 FOLFOX plus Avastin 10/16/2015 ? Cycle 9 FOLFOX plus Avastin 11/06/2015 ? Cycle 10 FOLFOX plus Avastin 11/20/2015 ? CTs 12/07/2015-mild decrease in wall thickening at the rectum, decrease in tiny hepatic metastases, stable lung nodules, no evidence of progressive metastatic disease, new left lower lobe infiltrate ? Treatment changed to every three-week Avastin with every other week Xeloda beginning 12/11/2015 ? Restaging CTs 04/12/2016-interval increase in size of adjacent right upper lobe pulmonary nodules and right lower lobe pulmonary nodule; unchanged 5 mm lesion right hepatic lobe; no additional visualized hepatic metastasis. Grossly unchanged wall thickening of the rectum. ? Continuation of Xeloda every other week and every three-week Avastin ? Xeloda placed on hold 05/06/2016 due to hand-foot syndrome 2. Multiple colon polyps on the colonoscopy 05/22/2015, tubular villous adenoma and a tubular adenoma were removed 3. Anemia-likely secondary to rectal bleeding.  4. Family history of multiple cancers including breast and prostate cancer 5. Transient right arm numbness 06/20/2015 6. Port-A-Cath placement 07/06/2015 by Dr. Marcello Moores  7. Mild neutropenia secondary to chemotherapy  following cycle 1 FOLFOX. Neulasta added with cycle 2. 8. Delayed nausea following chemotherapy-emend added with cycle 3 FOLFOX 9. Early oxaliplatin neuropathy 10. Hand-foot syndrome secondary to Xeloda 05/06/2016    Disposition: Hunter Hunter Walker appears stable. He has skin changes over the palms and soles consistent with hand-foot syndrome. Plan to continue to hold Xeloda. The numbness and pain involving the hands and feet are most likely related to previous oxaliplatin. The etiology of the left hand edema is unclear. He will elevate the arm as possible.  He will return for a follow-up visit on 05/28/2016 for reevaluation of the above symptoms. As noted above Xeloda will continue to be on hold.  Patient seen with Dr. Benay Spice.    Ned Card ANP/GNP-BC   05/13/2016  10:58 AM  This was a shared visit with Ned Card. Hunter Hunter Walker was interviewed and examined. The hand/foot changes related to Xeloda. Improved. The etiology of the discomfort at his wrist and mild swelling of the left hand is unclear. I doubt these findings are related to Xeloda or Avastin. Xeloda will remain on hold. He will return as scheduled on 05/28/2016.  Julieanne Manson, M.D.

## 2016-05-21 ENCOUNTER — Telehealth: Payer: Self-pay | Admitting: *Deleted

## 2016-05-21 ENCOUNTER — Other Ambulatory Visit: Payer: Self-pay | Admitting: *Deleted

## 2016-05-21 ENCOUNTER — Telehealth: Payer: Self-pay

## 2016-05-21 MED ORDER — GABAPENTIN 100 MG PO CAPS: 100.0000 mg | ORAL_CAPSULE | Freq: Every day | ORAL | 0 refills | Status: DC

## 2016-05-21 NOTE — Telephone Encounter (Signed)
Call placed to patient to inform him that prescription for Neurontin has been sent in by order of L. Thomas NP and to notify office if PN symptoms do not improve.  Patient appreciative of call back and has no further questions at this time.

## 2016-05-21 NOTE — Telephone Encounter (Signed)
Wife called asking if pt could get gabapentin for the pain in his hands.

## 2016-05-21 NOTE — Telephone Encounter (Signed)
Return call placed to patient.  Patient is requesting that gabapentin be prescribed for him d/t his increased difficulties and "shooting pain" d/t PN to his hands and fingers especially at night.  Instructed pt that I would speak with L. Thomas NP and call him back with orders.

## 2016-05-21 NOTE — Telephone Encounter (Signed)
Pt called asking about a medication a friend is taking. Called pt. LVM on home and cell phone.

## 2016-05-27 ENCOUNTER — Other Ambulatory Visit: Payer: Self-pay | Admitting: Oncology

## 2016-05-28 ENCOUNTER — Telehealth: Payer: Self-pay | Admitting: Nurse Practitioner

## 2016-05-28 ENCOUNTER — Ambulatory Visit (HOSPITAL_BASED_OUTPATIENT_CLINIC_OR_DEPARTMENT_OTHER): Payer: BLUE CROSS/BLUE SHIELD | Admitting: Nurse Practitioner

## 2016-05-28 ENCOUNTER — Ambulatory Visit: Payer: BLUE CROSS/BLUE SHIELD

## 2016-05-28 ENCOUNTER — Other Ambulatory Visit (HOSPITAL_BASED_OUTPATIENT_CLINIC_OR_DEPARTMENT_OTHER): Payer: BLUE CROSS/BLUE SHIELD

## 2016-05-28 ENCOUNTER — Ambulatory Visit (HOSPITAL_BASED_OUTPATIENT_CLINIC_OR_DEPARTMENT_OTHER): Payer: BLUE CROSS/BLUE SHIELD

## 2016-05-28 VITALS — BP 143/90 | HR 65 | Temp 98.2°F | Resp 18 | Wt 182.7 lb

## 2016-05-28 DIAGNOSIS — L271 Localized skin eruption due to drugs and medicaments taken internally: Secondary | ICD-10-CM

## 2016-05-28 DIAGNOSIS — C20 Malignant neoplasm of rectum: Secondary | ICD-10-CM | POA: Diagnosis not present

## 2016-05-28 DIAGNOSIS — G620 Drug-induced polyneuropathy: Secondary | ICD-10-CM | POA: Diagnosis not present

## 2016-05-28 DIAGNOSIS — Z5112 Encounter for antineoplastic immunotherapy: Secondary | ICD-10-CM | POA: Diagnosis not present

## 2016-05-28 DIAGNOSIS — C787 Secondary malignant neoplasm of liver and intrahepatic bile duct: Secondary | ICD-10-CM | POA: Diagnosis not present

## 2016-05-28 LAB — CBC WITH DIFFERENTIAL/PLATELET
BASO%: 0.6 % (ref 0.0–2.0)
Basophils Absolute: 0 10*3/uL (ref 0.0–0.1)
EOS ABS: 0.1 10*3/uL (ref 0.0–0.5)
EOS%: 1.3 % (ref 0.0–7.0)
HCT: 42 % (ref 38.4–49.9)
HGB: 14 g/dL (ref 13.0–17.1)
LYMPH%: 43 % (ref 14.0–49.0)
MCH: 34.6 pg — ABNORMAL HIGH (ref 27.2–33.4)
MCHC: 33.3 g/dL (ref 32.0–36.0)
MCV: 103.8 fL — AB (ref 79.3–98.0)
MONO#: 0.4 10*3/uL (ref 0.1–0.9)
MONO%: 7.9 % (ref 0.0–14.0)
NEUT#: 2.5 10*3/uL (ref 1.5–6.5)
NEUT%: 47.2 % (ref 39.0–75.0)
PLATELETS: 220 10*3/uL (ref 140–400)
RBC: 4.04 10*6/uL — AB (ref 4.20–5.82)
RDW: 17 % — ABNORMAL HIGH (ref 11.0–14.6)
WBC: 5.4 10*3/uL (ref 4.0–10.3)
lymph#: 2.3 10*3/uL (ref 0.9–3.3)

## 2016-05-28 LAB — COMPREHENSIVE METABOLIC PANEL
ALT: 11 U/L (ref 0–55)
ANION GAP: 8 meq/L (ref 3–11)
AST: 19 U/L (ref 5–34)
Albumin: 3.8 g/dL (ref 3.5–5.0)
Alkaline Phosphatase: 67 U/L (ref 40–150)
BILIRUBIN TOTAL: 0.5 mg/dL (ref 0.20–1.20)
BUN: 12.9 mg/dL (ref 7.0–26.0)
CALCIUM: 9.5 mg/dL (ref 8.4–10.4)
CHLORIDE: 106 meq/L (ref 98–109)
CO2: 26 meq/L (ref 22–29)
Creatinine: 1.2 mg/dL (ref 0.7–1.3)
EGFR: 73 mL/min/{1.73_m2} — AB (ref >90)
Glucose: 93 mg/dl (ref 70–140)
Potassium: 4.2 mEq/L (ref 3.5–5.1)
Sodium: 139 mEq/L (ref 136–145)
TOTAL PROTEIN: 8.3 g/dL (ref 6.4–8.3)

## 2016-05-28 LAB — CEA (IN HOUSE-CHCC): CEA (CHCC-IN HOUSE): 1.03 ng/mL (ref 0.00–5.00)

## 2016-05-28 LAB — UA PROTEIN, DIPSTICK - CHCC: PROTEIN: NEGATIVE mg/dL

## 2016-05-28 MED ORDER — HEPARIN SOD (PORK) LOCK FLUSH 100 UNIT/ML IV SOLN
500.0000 [IU] | Freq: Once | INTRAVENOUS | Status: AC | PRN
  Administered 2016-05-28: 500 [IU]
  Filled 2016-05-28: qty 5

## 2016-05-28 MED ORDER — SODIUM CHLORIDE 0.9 % IV SOLN
7.8000 mg/kg | Freq: Once | INTRAVENOUS | Status: AC
  Administered 2016-05-28: 600 mg via INTRAVENOUS
  Filled 2016-05-28: qty 16

## 2016-05-28 MED ORDER — SODIUM CHLORIDE 0.9% FLUSH
10.0000 mL | INTRAVENOUS | Status: DC | PRN
  Administered 2016-05-28: 10 mL
  Filled 2016-05-28: qty 10

## 2016-05-28 MED ORDER — SODIUM CHLORIDE 0.9 % IV SOLN
Freq: Once | INTRAVENOUS | Status: AC
  Administered 2016-05-28: 14:00:00 via INTRAVENOUS

## 2016-05-28 NOTE — Telephone Encounter (Signed)
Appointments scheduled per 12/26 LOS. Patient given AVS report and calendars with future scheduled appointments. Patient requested later appointment in the day.

## 2016-05-28 NOTE — Progress Notes (Signed)
[place] OFFICE PROGRESS NOTE   Diagnosis:  Rectal cancer  INTERVAL HISTORY:   Hunter Walker returns as scheduled. He reports feeling well. The dryness on the palms and soles has resolved. He denies nausea/vomiting. No mouth sores. No diarrhea. No bleeding. No shortness of breath. No chest pain. He has intermittent "shooting pains" involving both hands, mainly on the left. He was having difficulty sleeping due to the pain. We started him on Neurontin 100 mg at bedtime. He notes he is now able to sleep.  Objective:  Vital signs in last 24 hours:  Blood pressure (!) 143/90, pulse 65, temperature 98.2 F (36.8 C), temperature source Oral, resp. rate 18, weight 182 lb 11.2 oz (82.9 kg), SpO2 100 %.    HEENT: No thrush or ulcers. Resp: Lungs clear bilaterally. Cardio: Regular rate and rhythm. GI: Abdomen soft and nontender. No hepatomegaly. Vascular: No leg edema. Skin: Palms and soles without erythema. No skin breakdown.  Port-A-Cath without erythema.  Lab Results:  Lab Results  Component Value Date   WBC 5.4 05/28/2016   HGB 14.0 05/28/2016   HCT 42.0 05/28/2016   MCV 103.8 (H) 05/28/2016   PLT 220 05/28/2016   NEUTROABS 2.5 05/28/2016    Imaging:  No results found.  Medications: I have reviewed the patient's current medications.  Assessment/Plan: 1. Rectal cancer, invasive adenocarcinoma confirmed at colonoscopy 05/22/2015, EUS staging 06/01/2015 revealed a uT3,N1 lesion at 7 cm from the anal verge ? Staging CT abdomen/pelvis 05/15/2015 confirmed a mass at the rectosigmoid colon, a suspicious perirectal lymph node, and a 6 mm right liver lesion felt to most likely be a cyst ? CT chest 05/30/2015 with indeterminate pulmonary nodules and multiple liver lesions suspicious for metastases ? Ultrasound 06/13/2014 with no liver lesions identified ? MRI abdomen 06/21/2015 consistent with multiple liver metastases ? CT biopsy of liver lesion 06/26/2015.  Pathology showed metastatic adenocarcinoma consistent with colonic primary. ? Cycle 1 FOLFOX 07/10/2015 ? Cycle 2 FOLFOX plus Avastin 07/24/2015 with Neulasta support ? Cycle 3 FOLFOX plus Avastin 08/07/2015 ? Cycle 4 FOLFOX plus Avastin 08/21/2015 ? cycle 5 FOLFOX plus Avastin 09/04/2015 ? Restaging CTs 09/15/2015 revealed a decrease in the size of liver lesions, decreased rectal primary, stable/decreased size of tiny lung nodules ? Cycle 6 FOLFOX plus Avastin 09/18/2015 ? Cycle 7 FOLFOX plus Avastin 10/02/2015 ? Cycle 8 FOLFOX plus Avastin 10/16/2015 ? Cycle 9 FOLFOX plus Avastin 11/06/2015 ? Cycle 10 FOLFOX plus Avastin 11/20/2015 ? CTs 12/07/2015-mild decrease in wall thickening at the rectum, decrease in tiny hepatic metastases, stable lung nodules, no evidence of progressive metastatic disease, new left lower lobe infiltrate ? Treatment changed to every three-week Avastin with every other week Xeloda beginning 12/11/2015 ? Restaging CTs 04/12/2016-interval increase in size of adjacent right upper lobe pulmonary nodules and right lower lobe pulmonary nodule; unchanged 5 mm lesion right hepatic lobe; no additional visualized hepatic metastasis. Grossly unchanged wall thickening of the rectum. ? Continuation of Xeloda every other week and every three-week Avastin ? Xeloda placed on hold 05/06/2016 due to hand-foot syndrome ? Xeloda resumed 05/28/2016 1500 mg every morning and 1000 mg every afternoon 7 days on/7 days off 2. Multiple colon polyps on the colonoscopy 05/22/2015, tubular villous adenoma and a tubular adenoma were removed 3. Anemia-likely secondary to rectal bleeding.  4. Family history of multiple cancers including breast and prostate cancer 5. Transient right arm numbness 06/20/2015 6. Port-A-Cath placement 07/06/2015 by Dr. Marcello Moores 7. Mild neutropenia secondary to chemotherapy following  cycle 1 FOLFOX. Neulasta added with cycle 2. 8. Delayed nausea following  chemotherapy-emend added with cycle 3 FOLFOX 9. Early oxaliplatin neuropathy 10. Hand-foot syndrome secondary to Xeloda 05/06/2016; improved 05/28/2016. Xeloda resumed at a reduced dose.   Disposition: [name] appears well. The changes of hand foot syndrome have markedly improved. He will resume Xeloda at a reduced dose of 1500 mg every morning and 1000 mg every evening 7 days on/7 days off. If symptoms recur he understands to discontinue Xeloda and contact the office. Continue Avastin every 3 weeks.  He has noted improvement in the painful peripheral neuropathy with Neurontin. He will increase Neurontin to 100 mg twice daily.  He will return for a follow-up visit and Avastin in 3 weeks. He will contact the office in the interim as outlined above or with any other problems.  Plan reviewed with Dr. Benay Spice.    Hunter Walker ANP/GNP-BC   05/28/2016  12:56 PM

## 2016-05-28 NOTE — Patient Instructions (Signed)

## 2016-05-28 NOTE — Patient Instructions (Signed)
[facility] Discharge Instructions for Patients Receiving Chemotherapy  Today you received the following chemotherapy agents Avastin   To help prevent nausea and vomiting after your treatment, we encourage you to take your nausea medication as directed.    If you develop nausea and vomiting that is not controlled by your nausea medication, call the clinic.   BELOW ARE SYMPTOMS THAT SHOULD BE REPORTED IMMEDIATELY:  *FEVER GREATER THAN 100.5 F  *CHILLS WITH OR WITHOUT FEVER  NAUSEA AND VOMITING THAT IS NOT CONTROLLED WITH YOUR NAUSEA MEDICATION  *UNUSUAL SHORTNESS OF BREATH  *UNUSUAL BRUISING OR BLEEDING  TENDERNESS IN MOUTH AND THROAT WITH OR WITHOUT PRESENCE OF ULCERS  *URINARY PROBLEMS  *BOWEL PROBLEMS  UNUSUAL RASH Items with * indicate a potential emergency and should be followed up as soon as possible.  Feel free to call the clinic you have any questions or concerns. The clinic phone number is [phone].  Please show the CHEMO ALERT CARD at check-in to the Emergency Department and triage nurse.   

## 2016-06-08 ENCOUNTER — Other Ambulatory Visit: Payer: Self-pay | Admitting: Nurse Practitioner

## 2016-06-11 ENCOUNTER — Other Ambulatory Visit: Payer: Self-pay | Admitting: *Deleted

## 2016-06-11 DIAGNOSIS — C20 Malignant neoplasm of rectum: Secondary | ICD-10-CM

## 2016-06-11 MED ORDER — CAPECITABINE 500 MG PO TABS: ORAL_TABLET | ORAL | 0 refills | Status: DC

## 2016-06-16 ENCOUNTER — Other Ambulatory Visit: Payer: Self-pay | Admitting: Oncology

## 2016-06-17 ENCOUNTER — Ambulatory Visit: Payer: BLUE CROSS/BLUE SHIELD

## 2016-06-17 ENCOUNTER — Encounter: Payer: Self-pay | Admitting: *Deleted

## 2016-06-17 ENCOUNTER — Ambulatory Visit (HOSPITAL_BASED_OUTPATIENT_CLINIC_OR_DEPARTMENT_OTHER): Payer: BLUE CROSS/BLUE SHIELD | Admitting: Nurse Practitioner

## 2016-06-17 ENCOUNTER — Telehealth: Payer: Self-pay | Admitting: Oncology

## 2016-06-17 ENCOUNTER — Other Ambulatory Visit: Payer: BLUE CROSS/BLUE SHIELD

## 2016-06-17 ENCOUNTER — Ambulatory Visit (HOSPITAL_BASED_OUTPATIENT_CLINIC_OR_DEPARTMENT_OTHER): Payer: BLUE CROSS/BLUE SHIELD

## 2016-06-17 ENCOUNTER — Ambulatory Visit: Payer: BLUE CROSS/BLUE SHIELD | Admitting: Nurse Practitioner

## 2016-06-17 ENCOUNTER — Other Ambulatory Visit (HOSPITAL_BASED_OUTPATIENT_CLINIC_OR_DEPARTMENT_OTHER): Payer: BLUE CROSS/BLUE SHIELD

## 2016-06-17 VITALS — BP 138/81 | HR 72

## 2016-06-17 VITALS — BP 163/96 | HR 96 | Temp 98.2°F | Resp 18 | Ht 72.0 in | Wt 184.9 lb

## 2016-06-17 DIAGNOSIS — G620 Drug-induced polyneuropathy: Secondary | ICD-10-CM

## 2016-06-17 DIAGNOSIS — Z95828 Presence of other vascular implants and grafts: Secondary | ICD-10-CM

## 2016-06-17 DIAGNOSIS — C20 Malignant neoplasm of rectum: Secondary | ICD-10-CM | POA: Diagnosis not present

## 2016-06-17 DIAGNOSIS — L271 Localized skin eruption due to drugs and medicaments taken internally: Secondary | ICD-10-CM

## 2016-06-17 DIAGNOSIS — C787 Secondary malignant neoplasm of liver and intrahepatic bile duct: Secondary | ICD-10-CM | POA: Diagnosis not present

## 2016-06-17 DIAGNOSIS — Z5112 Encounter for antineoplastic immunotherapy: Secondary | ICD-10-CM

## 2016-06-17 LAB — COMPREHENSIVE METABOLIC PANEL
ALBUMIN: 3.7 g/dL (ref 3.5–5.0)
ALK PHOS: 64 U/L (ref 40–150)
ALT: 9 U/L (ref 0–55)
AST: 18 U/L (ref 5–34)
Anion Gap: 8 mEq/L (ref 3–11)
BILIRUBIN TOTAL: 0.33 mg/dL (ref 0.20–1.20)
BUN: 16.9 mg/dL (ref 7.0–26.0)
CO2: 27 meq/L (ref 22–29)
Calcium: 9.6 mg/dL (ref 8.4–10.4)
Chloride: 106 mEq/L (ref 98–109)
Creatinine: 1.1 mg/dL (ref 0.7–1.3)
EGFR: 78 mL/min/{1.73_m2} — AB (ref >90)
GLUCOSE: 88 mg/dL (ref 70–140)
Potassium: 4.3 mEq/L (ref 3.5–5.1)
Sodium: 140 mEq/L (ref 136–145)
TOTAL PROTEIN: 8.2 g/dL (ref 6.4–8.3)

## 2016-06-17 LAB — CBC WITH DIFFERENTIAL/PLATELET
BASO%: 0.4 % (ref 0.0–2.0)
Basophils Absolute: 0 10*3/uL (ref 0.0–0.1)
EOS ABS: 0.1 10*3/uL (ref 0.0–0.5)
EOS%: 1.7 % (ref 0.0–7.0)
HCT: 42.1 % (ref 38.4–49.9)
HEMOGLOBIN: 14 g/dL (ref 13.0–17.1)
LYMPH%: 41.1 % (ref 14.0–49.0)
MCH: 34.3 pg — ABNORMAL HIGH (ref 27.2–33.4)
MCHC: 33.2 g/dL (ref 32.0–36.0)
MCV: 103.3 fL — AB (ref 79.3–98.0)
MONO#: 0.4 10*3/uL (ref 0.1–0.9)
MONO%: 7.1 % (ref 0.0–14.0)
NEUT%: 49.7 % (ref 39.0–75.0)
NEUTROS ABS: 3 10*3/uL (ref 1.5–6.5)
Platelets: 236 10*3/uL (ref 140–400)
RBC: 4.08 10*6/uL — AB (ref 4.20–5.82)
RDW: 17.1 % — AB (ref 11.0–14.6)
WBC: 6.1 10*3/uL (ref 4.0–10.3)
lymph#: 2.5 10*3/uL (ref 0.9–3.3)

## 2016-06-17 LAB — CEA (IN HOUSE-CHCC): CEA (CHCC-In House): 1.17 ng/mL (ref 0.00–5.00)

## 2016-06-17 LAB — UA PROTEIN, DIPSTICK - CHCC: Protein, ur: NEGATIVE mg/dL

## 2016-06-17 MED ORDER — SODIUM CHLORIDE 0.9 % IV SOLN
Freq: Once | INTRAVENOUS | Status: AC
  Administered 2016-06-17: 13:00:00 via INTRAVENOUS

## 2016-06-17 MED ORDER — HEPARIN SOD (PORK) LOCK FLUSH 100 UNIT/ML IV SOLN
500.0000 [IU] | Freq: Once | INTRAVENOUS | Status: AC | PRN
  Administered 2016-06-17: 500 [IU]
  Filled 2016-06-17: qty 5

## 2016-06-17 MED ORDER — SODIUM CHLORIDE 0.9 % IV SOLN
7.8000 mg/kg | Freq: Once | INTRAVENOUS | Status: AC
  Administered 2016-06-17: 600 mg via INTRAVENOUS
  Filled 2016-06-17: qty 16

## 2016-06-17 MED ORDER — SODIUM CHLORIDE 0.9 % IJ SOLN
10.0000 mL | INTRAMUSCULAR | Status: DC | PRN
  Administered 2016-06-17: 10 mL via INTRAVENOUS
  Filled 2016-06-17: qty 10

## 2016-06-17 MED ORDER — SODIUM CHLORIDE 0.9% FLUSH
10.0000 mL | INTRAVENOUS | Status: DC | PRN
  Administered 2016-06-17: 10 mL
  Filled 2016-06-17: qty 10

## 2016-06-17 NOTE — Telephone Encounter (Signed)
Gave patient avs report and appointments for February. Left message for [facility] Neurology re appointment with Dr. Carles Collet and re-routed referral to their department. Office will call patient re appointment.

## 2016-06-17 NOTE — Progress Notes (Signed)
Oncology Nurse Navigator Documentation  Oncology Nurse Navigator Flowsheets 06/17/2016  Navigator Location [facility]  Referral date to RadOnc/MedOnc -  Navigator Encounter Type Treatment  Abnormal Finding Date -  Confirmed Diagnosis Date -  Treatment Initiated Date -  Patient Visit Type MedOnc  Treatment Phase Active Tx--Xeloda/Avastin  maintainence  Barriers/Navigation Needs No barriers at this time;No Questions;No Needs  Interventions None required  Coordination of Care -  Support Groups/Services -  Acuity -  Time Spent with Patient 15  Encouraged him to call if he has not heard about appointment with neurology by 1 week.

## 2016-06-17 NOTE — Patient Instructions (Signed)
[facility] Discharge Instructions for Patients Receiving Chemotherapy  Today you received the following chemotherapy agents Avastin   To help prevent nausea and vomiting after your treatment, we encourage you to take your nausea medication as directed.    If you develop nausea and vomiting that is not controlled by your nausea medication, call the clinic.   BELOW ARE SYMPTOMS THAT SHOULD BE REPORTED IMMEDIATELY:  *FEVER GREATER THAN 100.5 F  *CHILLS WITH OR WITHOUT FEVER  NAUSEA AND VOMITING THAT IS NOT CONTROLLED WITH YOUR NAUSEA MEDICATION  *UNUSUAL SHORTNESS OF BREATH  *UNUSUAL BRUISING OR BLEEDING  TENDERNESS IN MOUTH AND THROAT WITH OR WITHOUT PRESENCE OF ULCERS  *URINARY PROBLEMS  *BOWEL PROBLEMS  UNUSUAL RASH Items with * indicate a potential emergency and should be followed up as soon as possible.  Feel free to call the clinic you have any questions or concerns. The clinic phone number is [phone].  Please show the CHEMO ALERT CARD at check-in to the Emergency Department and triage nurse.   

## 2016-06-17 NOTE — Progress Notes (Addendum)
[place] OFFICE PROGRESS NOTE   Diagnosis:  Rectal cancer  INTERVAL HISTORY:   Mr. Kuyper returns as scheduled. He continues Xeloda 7 days on/7 days off and every three-week Avastin. He denies nausea/vomiting. No mouth sores. No diarrhea. No hand or foot pain or redness. He notes progressive weakness involving both hands and wrists. He has pain in the hands mainly at nighttime. He continues to note improvement in the pain since beginning gabapentin. No neck pain.  Objective:  Vital signs in last 24 hours:  Blood pressure (!) 163/96, pulse 96, temperature 98.2 F (36.8 C), temperature source Oral, resp. rate 18, height 6' (1.829 m), weight 184 lb 14.4 oz (83.9 kg), SpO2 100 %.    HEENT: No thrush or ulcers. Resp: Lungs clear bilaterally. Cardio: Regular rate and rhythm. GI: Abdomen soft and nontender. No hepatomegaly. Vascular: No leg edema. Neuro: Weakness bilateral hands/fingers. Upper extremity strength otherwise appears intact.  Skin: Palms without erythema. Port-A-Cath without erythema.    Lab Results:  Lab Results  Component Value Date   WBC 6.1 06/17/2016   HGB 14.0 06/17/2016   HCT 42.1 06/17/2016   MCV 103.3 (H) 06/17/2016   PLT 236 06/17/2016   NEUTROABS 3.0 06/17/2016    Imaging:  No results found.  Medications: I have reviewed the patient's current medications.  Assessment/Plan: 1. Rectal cancer, invasive adenocarcinoma confirmed at colonoscopy 05/22/2015, EUS staging 06/01/2015 revealed a uT3,N1 lesion at 7 cm from the anal verge ? Staging CT abdomen/pelvis 05/15/2015 confirmed a mass at the rectosigmoid colon, a suspicious perirectal lymph node, and a 6 mm right liver lesion felt to most likely be a cyst ? CT chest 05/30/2015 with indeterminate pulmonary nodules and multiple liver lesions suspicious for metastases ? Ultrasound 06/13/2014 with no liver lesions identified ? MRI abdomen 06/21/2015 consistent with multiple liver  metastases ? CT biopsy of liver lesion 06/26/2015. Pathology showed metastatic adenocarcinoma consistent with colonic primary. ? Cycle 1 FOLFOX 07/10/2015 ? Cycle 2 FOLFOX plus Avastin 07/24/2015 with Neulasta support ? Cycle 3 FOLFOX plus Avastin 08/07/2015 ? Cycle 4 FOLFOX plus Avastin 08/21/2015 ? cycle 5 FOLFOX plus Avastin 09/04/2015 ? Restaging CTs 09/15/2015 revealed a decrease in the size of liver lesions, decreased rectal primary, stable/decreased size of tiny lung nodules ? Cycle 6 FOLFOX plus Avastin 09/18/2015 ? Cycle 7 FOLFOX plus Avastin 10/02/2015 ? Cycle 8 FOLFOX plus Avastin 10/16/2015 ? Cycle 9 FOLFOX plus Avastin 11/06/2015 ? Cycle 10 FOLFOX plus Avastin 11/20/2015 ? CTs 12/07/2015-mild decrease in wall thickening at the rectum, decrease in tiny hepatic metastases, stable lung nodules, no evidence of progressive metastatic disease, new left lower lobe infiltrate ? Treatment changed to every three-week Avastin with every other week Xeloda beginning 12/11/2015 ? Restaging CTs 04/12/2016-interval increase in size of adjacent right upper lobe pulmonary nodules and right lower lobe pulmonary nodule; unchanged 5 mm lesion right hepatic lobe; no additional visualized hepatic metastasis. Grossly unchanged wall thickening of the rectum. ? Continuation of Xeloda every other week and every three-week Avastin ? Xeloda placed on hold 05/06/2016 due to hand-foot syndrome ? Xeloda resumed 05/28/2016 1500 mg every morning and 1000 mg every afternoon 7 days on/7 days off 2. Multiple colon polyps on the colonoscopy 05/22/2015, tubular villous adenoma and a tubular adenoma were removed 3. Anemia-likely secondary to rectal bleeding.  4. Family history of multiple cancers including breast and prostate cancer 5. Transient right arm numbness 06/20/2015 6. Port-A-Cath placement 07/06/2015 by Dr. Marcello Moores 7. Mild neutropenia secondary to  chemotherapy following cycle 1 FOLFOX. Neulasta added with  cycle 2. 8. Delayed nausea following chemotherapy-emend added with cycle 3 FOLFOX 9. Early oxaliplatin neuropathy 10. Hand-foot syndrome secondary to Xeloda 05/06/2016; improved 05/28/2016. Xeloda resumed at a reduced dose. 11. Bilateral hand weakness. Question related to previous oxaliplatin. Referred to neurology 06/17/2016.    Disposition: Mr. Pearo appears stable. There is no clinical evidence of disease progression. Plan to continue every other week Xeloda and every three-week Avastin.  Blood pressure is elevated. We will repeat prior to administering Avastin.  He notes progressive weakness involving both hands. Question related to previous oxaliplatin. We made a referral to Dr. Carles Collet, neurology.  He will return for a follow-up visit and Avastin in 3 weeks. He will contact the office in the interim with any problems.  Plan reviewed with Dr. Benay Spice. 25 minutes were spent face-to-face at today's visit with the majority of that time involved in counseling/coordination of care.    Ned Card ANP/GNP-BC   06/17/2016  12:35 PM

## 2016-06-25 ENCOUNTER — Ambulatory Visit (INDEPENDENT_AMBULATORY_CARE_PROVIDER_SITE_OTHER): Payer: BLUE CROSS/BLUE SHIELD | Admitting: Internal Medicine

## 2016-06-25 ENCOUNTER — Encounter: Payer: Self-pay | Admitting: Internal Medicine

## 2016-06-25 DIAGNOSIS — Z0000 Encounter for general adult medical examination without abnormal findings: Secondary | ICD-10-CM | POA: Insufficient documentation

## 2016-06-25 DIAGNOSIS — K219 Gastro-esophageal reflux disease without esophagitis: Secondary | ICD-10-CM | POA: Diagnosis not present

## 2016-06-25 MED ORDER — GABAPENTIN 300 MG PO CAPS: 300.0000 mg | ORAL_CAPSULE | Freq: Every day | ORAL | 3 refills | Status: DC

## 2016-06-25 NOTE — Progress Notes (Signed)
Pre visit review using our clinic review tool, if applicable. No additional management support is needed unless otherwise documented below in the visit note. 

## 2016-06-25 NOTE — Assessment & Plan Note (Signed)
Should not get shingles during active chemotherapy. Declines tetanus today. Declines labs today as getting a lot with oncology. Colonoscopy up to date. Counseled about sun safety and mole surveillance with the chemo, counseled about dangers of distracted driving. Given screening recommendations.

## 2016-06-25 NOTE — Assessment & Plan Note (Signed)
Still taking protonix and doing well with GERD.

## 2016-06-25 NOTE — Patient Instructions (Signed)
We have sent in the higher dose of gabapentin. This is 300 mg. Take 1 pill at night time starting tonight. Give it 3-5 days and if you are still having pain increase to 2 pills at night time.   Health Maintenance, Male A healthy lifestyle and preventative care can promote health and wellness.  Maintain regular health, dental, and eye exams.  Eat a healthy diet. Foods like vegetables, fruits, whole grains, low-fat dairy products, and lean protein foods contain the nutrients you need and are low in calories. Decrease your intake of foods high in solid fats, added sugars, and salt. Get information about a proper diet from your health care provider, if necessary.  Regular physical exercise is one of the most important things you can do for your health. Most adults should get at least 150 minutes of moderate-intensity exercise (any activity that increases your heart rate and causes you to sweat) each week. In addition, most adults need muscle-strengthening exercises on 2 or more days a week.   Maintain a healthy weight. The body mass index (BMI) is a screening tool to identify possible weight problems. It provides an estimate of body fat based on height and weight. Your health care provider can find your BMI and can help you achieve or maintain a healthy weight. For males 20 years and older:  A BMI below 18.5 is considered underweight.  A BMI of 18.5 to 24.9 is normal.  A BMI of 25 to 29.9 is considered overweight.  A BMI of 30 and above is considered obese.  Maintain normal blood lipids and cholesterol by exercising and minimizing your intake of saturated fat. Eat a balanced diet with plenty of fruits and vegetables. Blood tests for lipids and cholesterol should begin at age 52 and be repeated every 5 years. If your lipid or cholesterol levels are high, you are over age 54, or you are at high risk for heart disease, you may need your cholesterol levels checked more frequently.Ongoing high lipid  and cholesterol levels should be treated with medicines if diet and exercise are not working.  If you smoke, find out from your health care provider how to quit. If you do not use tobacco, do not start.  Lung cancer screening is recommended for adults aged 53-80 years who are at high risk for developing lung cancer because of a history of smoking. A yearly low-dose CT scan of the lungs is recommended for people who have at least a 30-pack-year history of smoking and are current smokers or have quit within the past 15 years. A pack year of smoking is smoking an average of 1 pack of cigarettes a day for 1 year (for example, a 30-pack-year history of smoking could mean smoking 1 pack a day for 30 years or 2 packs a day for 15 years). Yearly screening should continue until the smoker has stopped smoking for at least 15 years. Yearly screening should be stopped for people who develop a health problem that would prevent them from having lung cancer treatment.  If you choose to drink alcohol, do not have more than 2 drinks per day. One drink is considered to be 12 oz (360 mL) of beer, 5 oz (150 mL) of wine, or 1.5 oz (45 mL) of liquor.  Avoid the use of street drugs. Do not share needles with anyone. Ask for help if you need support or instructions about stopping the use of drugs.  High blood pressure causes heart disease and increases the risk  of stroke. High blood pressure is more likely to develop in:  People who have blood pressure in the end of the normal range (100-139/85-89 mm Hg).  People who are overweight or obese.  People who are African American.  If you are 5-48 years of age, have your blood pressure checked every 3-5 years. If you are 46 years of age or older, have your blood pressure checked every year. You should have your blood pressure measured twice-once when you are at a hospital or clinic, and once when you are not at a hospital or clinic. Record the average of the two measurements.  To check your blood pressure when you are not at a hospital or clinic, you can use:  An automated blood pressure machine at a pharmacy.  A home blood pressure monitor.  If you are 31-74 years old, ask your health care provider if you should take aspirin to prevent heart disease.  Diabetes screening involves taking a blood sample to check your fasting blood sugar level. This should be done once every 3 years after age 49 if you are at a normal weight and without risk factors for diabetes. Testing should be considered at a younger age or be carried out more frequently if you are overweight and have at least 1 risk factor for diabetes.  Colorectal cancer can be detected and often prevented. Most routine colorectal cancer screening begins at the age of 64 and continues through age 67. However, your health care provider may recommend screening at an earlier age if you have risk factors for colon cancer. On a yearly basis, your health care provider may provide home test kits to check for hidden blood in the stool. A small camera at the end of a tube may be used to directly examine the colon (sigmoidoscopy or colonoscopy) to detect the earliest forms of colorectal cancer. Talk to your health care provider about this at age 32 when routine screening begins. A direct exam of the colon should be repeated every 5-10 years through age 75, unless early forms of precancerous polyps or small growths are found.  People who are at an increased risk for hepatitis B should be screened for this virus. You are considered at high risk for hepatitis B if:  You were born in a country where hepatitis B occurs often. Talk with your health care provider about which countries are considered high risk.  Your parents were born in a high-risk country and you have not received a shot to protect against hepatitis B (hepatitis B vaccine).  You have HIV or AIDS.  You use needles to inject street drugs.  You live with, or have  sex with, someone who has hepatitis B.  You are a man who has sex with other men (MSM).  You get hemodialysis treatment.  You take certain medicines for conditions like cancer, organ transplantation, and autoimmune conditions.  Hepatitis C blood testing is recommended for all people born from [birth year] through 1964 and any individual with known risk factors for hepatitis C.  Healthy men should no longer receive prostate-specific antigen (PSA) blood tests as part of routine cancer screening. Talk to your health care provider about prostate cancer screening.  Testicular cancer screening is not recommended for adolescents or adult males who have no symptoms. Screening includes self-exam, a health care provider exam, and other screening tests. Consult with your health care provider about any symptoms you have or any concerns you have about testicular cancer.  Practice safe sex.  Use condoms and avoid high-risk sexual practices to reduce the spread of sexually transmitted infections (STIs).  You should be screened for STIs, including gonorrhea and chlamydia if:  You are sexually active and are younger than 24 years.  You are older than 24 years, and your health care provider tells you that you are at risk for this type of infection.  Your sexual activity has changed since you were last screened, and you are at an increased risk for chlamydia or gonorrhea. Ask your health care provider if you are at risk.  If you are at risk of being infected with HIV, it is recommended that you take a prescription medicine daily to prevent HIV infection. This is called pre-exposure prophylaxis (PrEP). You are considered at risk if:  You are a man who has sex with other men (MSM).  You are a heterosexual man who is sexually active with multiple partners.  You take drugs by injection.  You are sexually active with a partner who has HIV.  Talk with your health care provider about whether you are at high risk of  being infected with HIV. If you choose to begin PrEP, you should first be tested for HIV. You should then be tested every 3 months for as long as you are taking PrEP.  Use sunscreen. Apply sunscreen liberally and repeatedly throughout the day. You should seek shade when your shadow is shorter than you. Protect yourself by wearing long sleeves, pants, a wide-brimmed hat, and sunglasses year round whenever you are outdoors.  Tell your health care provider of new moles or changes in moles, especially if there is a change in shape or color. Also, tell your health care provider if a mole is larger than the size of a pencil eraser.  A one-time screening for abdominal aortic aneurysm (AAA) and surgical repair of large AAAs by ultrasound is recommended for men aged 70-75 years who are current or former smokers.  Stay current with your vaccines (immunizations). This information is not intended to replace advice given to you by your health care provider. Make sure you discuss any questions you have with your health care provider. Document Released: 11/16/2007 Document Revised: 06/10/2014 Document Reviewed: 02/21/2015 Elsevier Interactive Patient Education  2017 [employer].

## 2016-06-25 NOTE — Progress Notes (Signed)
   Subjective:    Patient ID: Hunter Walker, male    DOB: 27-Feb-1951, 66 y.o.   MRN: [id number]  HPI The patient is a 66 YO man coming in for wellness. No new concerns.   PMH, [facility], social history reviewed and updated.   Review of Systems  Constitutional: Positive for activity change. Negative for appetite change, chills, fatigue, fever and unexpected weight change.  HENT: Negative.   Eyes: Negative.   Respiratory: Negative for cough, chest tightness and shortness of breath.   Cardiovascular: Negative for chest pain, palpitations and leg swelling.  Gastrointestinal: Negative for abdominal distention, abdominal pain, constipation, diarrhea, nausea and vomiting.  Musculoskeletal: Positive for arthralgias and myalgias. Negative for back pain, gait problem, joint swelling, neck pain and neck stiffness.  Skin: Negative.   Neurological: Positive for numbness. Negative for dizziness, tremors, weakness, light-headedness and headaches.  Psychiatric/Behavioral: Negative.       Objective:   Physical Exam  Constitutional: He is oriented to person, place, and time. He appears well-developed and well-nourished.  HENT:  Head: Normocephalic and atraumatic.  Eyes: EOM are normal.  Neck: Normal range of motion.  Cardiovascular: Normal rate and regular rhythm.   Pulmonary/Chest: Effort normal and breath sounds normal. No respiratory distress. He has no wheezes. He has no rales.  Abdominal: Soft. Bowel sounds are normal. He exhibits no distension. There is no tenderness. There is no rebound.  Musculoskeletal: He exhibits no edema.  Neurological: He is alert and oriented to person, place, and time. Coordination normal.  Skin: Skin is warm and dry.  Psychiatric: He has a normal mood and affect.   Vitals:   06/25/16 0908  BP: (!) 142/90  Pulse: 69  Resp: 12  Temp: 97.4 F (36.3 C)  TempSrc: Oral  SpO2: 99%  Weight: 184 lb (83.5 kg)  Height: 6' (1.829 m)      Assessment & Plan:

## 2016-06-28 ENCOUNTER — Encounter: Payer: Self-pay | Admitting: Oncology

## 2016-06-28 NOTE — Progress Notes (Signed)
Rcvd letter from [place].  Pt is re enrolled for the copay card for Avastin from 06/06/16 to 06/05/17 for $25,000.  Emailed copy of approval letter to Pearletha Furl, Waldon Merl and Elmyra Ricks in the billing dept and gave a copy to HIM to scan in pt's chart.

## 2016-07-07 ENCOUNTER — Other Ambulatory Visit: Payer: Self-pay | Admitting: Oncology

## 2016-07-08 ENCOUNTER — Other Ambulatory Visit: Payer: Self-pay | Admitting: Internal Medicine

## 2016-07-08 ENCOUNTER — Ambulatory Visit (HOSPITAL_BASED_OUTPATIENT_CLINIC_OR_DEPARTMENT_OTHER): Payer: BLUE CROSS/BLUE SHIELD | Admitting: Oncology

## 2016-07-08 ENCOUNTER — Other Ambulatory Visit (HOSPITAL_BASED_OUTPATIENT_CLINIC_OR_DEPARTMENT_OTHER): Payer: BLUE CROSS/BLUE SHIELD

## 2016-07-08 ENCOUNTER — Ambulatory Visit: Payer: BLUE CROSS/BLUE SHIELD

## 2016-07-08 ENCOUNTER — Ambulatory Visit (HOSPITAL_BASED_OUTPATIENT_CLINIC_OR_DEPARTMENT_OTHER): Payer: BLUE CROSS/BLUE SHIELD

## 2016-07-08 ENCOUNTER — Telehealth: Payer: Self-pay | Admitting: Oncology

## 2016-07-08 VITALS — BP 144/100 | HR 67 | Temp 98.3°F | Resp 18 | Ht 72.0 in | Wt 185.8 lb

## 2016-07-08 VITALS — BP 133/95 | HR 69

## 2016-07-08 DIAGNOSIS — R531 Weakness: Secondary | ICD-10-CM

## 2016-07-08 DIAGNOSIS — C787 Secondary malignant neoplasm of liver and intrahepatic bile duct: Secondary | ICD-10-CM

## 2016-07-08 DIAGNOSIS — Z95828 Presence of other vascular implants and grafts: Secondary | ICD-10-CM

## 2016-07-08 DIAGNOSIS — L271 Localized skin eruption due to drugs and medicaments taken internally: Secondary | ICD-10-CM

## 2016-07-08 DIAGNOSIS — C20 Malignant neoplasm of rectum: Secondary | ICD-10-CM

## 2016-07-08 DIAGNOSIS — Z5112 Encounter for antineoplastic immunotherapy: Secondary | ICD-10-CM

## 2016-07-08 DIAGNOSIS — I10 Essential (primary) hypertension: Secondary | ICD-10-CM

## 2016-07-08 LAB — CBC WITH DIFFERENTIAL/PLATELET
BASO%: 0.4 % (ref 0.0–2.0)
BASOS ABS: 0 10*3/uL (ref 0.0–0.1)
EOS ABS: 0.1 10*3/uL (ref 0.0–0.5)
EOS%: 1.3 % (ref 0.0–7.0)
HEMATOCRIT: 43.4 % (ref 38.4–49.9)
HGB: 14.1 g/dL (ref 13.0–17.1)
LYMPH%: 49.5 % — AB (ref 14.0–49.0)
MCH: 33.7 pg — ABNORMAL HIGH (ref 27.2–33.4)
MCHC: 32.5 g/dL (ref 32.0–36.0)
MCV: 103.8 fL — AB (ref 79.3–98.0)
MONO#: 0.5 10*3/uL (ref 0.1–0.9)
MONO%: 8.2 % (ref 0.0–14.0)
NEUT#: 2.2 10*3/uL (ref 1.5–6.5)
NEUT%: 40.6 % (ref 39.0–75.0)
PLATELETS: 214 10*3/uL (ref 140–400)
RBC: 4.18 10*6/uL — AB (ref 4.20–5.82)
RDW: 15.9 % — ABNORMAL HIGH (ref 11.0–14.6)
WBC: 5.5 10*3/uL (ref 4.0–10.3)
lymph#: 2.7 10*3/uL (ref 0.9–3.3)

## 2016-07-08 LAB — COMPREHENSIVE METABOLIC PANEL
ALT: 13 U/L (ref 0–55)
ANION GAP: 7 meq/L (ref 3–11)
AST: 17 U/L (ref 5–34)
Albumin: 3.9 g/dL (ref 3.5–5.0)
Alkaline Phosphatase: 65 U/L (ref 40–150)
BILIRUBIN TOTAL: 0.52 mg/dL (ref 0.20–1.20)
BUN: 17 mg/dL (ref 7.0–26.0)
CALCIUM: 9.8 mg/dL (ref 8.4–10.4)
CHLORIDE: 103 meq/L (ref 98–109)
CO2: 28 meq/L (ref 22–29)
Creatinine: 1.3 mg/dL (ref 0.7–1.3)
EGFR: 70 mL/min/{1.73_m2} — AB (ref >90)
Glucose: 93 mg/dl (ref 70–140)
Potassium: 4.2 mEq/L (ref 3.5–5.1)
Sodium: 138 mEq/L (ref 136–145)
Total Protein: 8.3 g/dL (ref 6.4–8.3)

## 2016-07-08 LAB — UA PROTEIN, DIPSTICK - CHCC: PROTEIN: NEGATIVE mg/dL

## 2016-07-08 LAB — CEA (IN HOUSE-CHCC): CEA (CHCC-IN HOUSE): 1.23 ng/mL (ref 0.00–5.00)

## 2016-07-08 MED ORDER — SODIUM CHLORIDE 0.9 % IJ SOLN
10.0000 mL | INTRAMUSCULAR | Status: DC | PRN
Start: 2016-07-08 — End: 2016-07-08
  Administered 2016-07-08: 10 mL via INTRAVENOUS
  Filled 2016-07-08: qty 10

## 2016-07-08 MED ORDER — AMLODIPINE BESYLATE 5 MG PO TABS: 5.0000 mg | ORAL_TABLET | Freq: Every day | ORAL | 0 refills | Status: DC

## 2016-07-08 MED ORDER — SODIUM CHLORIDE 0.9 % IV SOLN
Freq: Once | INTRAVENOUS | Status: AC
  Administered 2016-07-08: 12:00:00 via INTRAVENOUS

## 2016-07-08 MED ORDER — HEPARIN SOD (PORK) LOCK FLUSH 100 UNIT/ML IV SOLN
500.0000 [IU] | Freq: Once | INTRAVENOUS | Status: AC | PRN
  Administered 2016-07-08: 500 [IU]
  Filled 2016-07-08: qty 5

## 2016-07-08 MED ORDER — SODIUM CHLORIDE 0.9% FLUSH
10.0000 mL | INTRAVENOUS | Status: DC | PRN
  Administered 2016-07-08: 10 mL
  Filled 2016-07-08: qty 10

## 2016-07-08 MED ORDER — SODIUM CHLORIDE 0.9 % IV SOLN
7.8000 mg/kg | Freq: Once | INTRAVENOUS | Status: AC
  Administered 2016-07-08: 600 mg via INTRAVENOUS
  Filled 2016-07-08: qty 16

## 2016-07-08 NOTE — Patient Instructions (Signed)
[facility] Discharge Instructions for Patients Receiving Chemotherapy  Today you received the following chemotherapy agents Avastin   To help prevent nausea and vomiting after your treatment, we encourage you to take your nausea medication as directed.    If you develop nausea and vomiting that is not controlled by your nausea medication, call the clinic.   BELOW ARE SYMPTOMS THAT SHOULD BE REPORTED IMMEDIATELY:  *FEVER GREATER THAN 100.5 F  *CHILLS WITH OR WITHOUT FEVER  NAUSEA AND VOMITING THAT IS NOT CONTROLLED WITH YOUR NAUSEA MEDICATION  *UNUSUAL SHORTNESS OF BREATH  *UNUSUAL BRUISING OR BLEEDING  TENDERNESS IN MOUTH AND THROAT WITH OR WITHOUT PRESENCE OF ULCERS  *URINARY PROBLEMS  *BOWEL PROBLEMS  UNUSUAL RASH Items with * indicate a potential emergency and should be followed up as soon as possible.  Feel free to call the clinic you have any questions or concerns. The clinic phone number is [phone].  Please show the CHEMO ALERT CARD at check-in to the Emergency Department and triage nurse.   

## 2016-07-08 NOTE — Patient Instructions (Signed)

## 2016-07-08 NOTE — Addendum Note (Signed)
Addended by: Rosalio Macadamia C on: 07/08/2016 11:04 AM   Modules accepted: Orders

## 2016-07-08 NOTE — Progress Notes (Signed)
[place] OFFICE PROGRESS NOTE   Diagnosis: Rectal cancer  INTERVAL HISTORY:   [name] returns as scheduled. He completed another week of Xeloda yesterday. No mouth sores, diarrhea, or hand/foot pain. He continues to have weakness and numbness in the hands, but this has improved. There is persistent numbness in the toes. He feels "weak "during the week on Xeloda. He feels well at other times.  Objective:  Vital signs in last 24 hours:  Blood pressure (!) 144/100, pulse 67, temperature 98.3 F (36.8 C), temperature source Oral, resp. rate 18, height 6' (1.829 m), weight 185 lb 12.8 oz (84.3 kg), SpO2 97 %.    HEENT: No thrush or ulcers Resp: Lungs clear bilaterally Cardio: Regular rate and rhythm GI: No hepatomegaly, nontender, no apparent ascites Vascular: No leg edema Neuro: Decrease in grip strength at the hand bilaterally  Skin: Hyperpigmentation with skin thickening at the palms-no skin breakdown. Superficial desquamation of the feet.   Portacath/PICC-without erythema  Lab Results:  Lab Results  Component Value Date   WBC 5.5 07/08/2016   HGB 14.1 07/08/2016   HCT 43.4 07/08/2016   MCV 103.8 (H) 07/08/2016   PLT 214 07/08/2016   NEUTROABS 2.2 07/08/2016   CEA on 06/17/2016: 1.17   Im Medications: I have reviewed the patient's current medications.  Assessment/Plan: 1. Rectal cancer, invasive adenocarcinoma confirmed at colonoscopy 05/22/2015, EUS staging 06/01/2015 revealed a uT3,N1 lesion at 7 cm from the anal verge ? Staging CT abdomen/pelvis 05/15/2015 confirmed a mass at the rectosigmoid colon, a suspicious perirectal lymph node, and a 6 mm right liver lesion felt to most likely be a cyst ? CT chest 05/30/2015 with indeterminate pulmonary nodules and multiple liver lesions suspicious for metastases ? Ultrasound 06/13/2014 with no liver lesions identified ? MRI abdomen 06/21/2015 consistent with multiple liver metastases ? CT biopsy of  liver lesion 06/26/2015. Pathology showed metastatic adenocarcinoma consistent with colonic primary. ? Cycle 1 FOLFOX 07/10/2015 ? Cycle 2 FOLFOX plus Avastin 07/24/2015 with Neulasta support ? Cycle 3 FOLFOX plus Avastin 08/07/2015 ? Cycle 4 FOLFOX plus Avastin 08/21/2015 ? cycle 5 FOLFOX plus Avastin 09/04/2015 ? Restaging CTs 09/15/2015 revealed a decrease in the size of liver lesions, decreased rectal primary, stable/decreased size of tiny lung nodules ? Cycle 6 FOLFOX plus Avastin 09/18/2015 ? Cycle 7 FOLFOX plus Avastin 10/02/2015 ? Cycle 8 FOLFOX plus Avastin 10/16/2015 ? Cycle 9 FOLFOX plus Avastin 11/06/2015 ? Cycle 10 FOLFOX plus Avastin 11/20/2015 ? CTs 12/07/2015-mild decrease in wall thickening at the rectum, decrease in tiny hepatic metastases, stable lung nodules, no evidence of progressive metastatic disease, new left lower lobe infiltrate ? Treatment changed to every three-week Avastin with every other week Xeloda beginning 12/11/2015 ? Restaging CTs 04/12/2016-interval increase in size of adjacent right upper lobe pulmonary nodules and right lower lobe pulmonary nodule; unchanged 5 mm lesion right hepatic lobe; no additional visualized hepatic metastasis. Grossly unchanged wall thickening of the rectum. ? Continuation of Xeloda every other week and every three-week Avastin ? Xeloda placed on hold 05/06/2016 due to hand-foot syndrome ? Xeloda resumed 05/28/2016 1500 mg every morning and 1000 mg every afternoon 7 days on/7 days off 2. Multiple colon polyps on the colonoscopy 05/22/2015, tubular villous adenoma and a tubular adenoma were removed 3. Anemia-likely secondary to rectal bleeding.  4. Family history of multiple cancers including breast and prostate cancer 5. Transient right arm numbness 06/20/2015 6. Port-A-Cath placement 07/06/2015 by Dr. Marcello Moores 7. Mild neutropenia secondary to chemotherapy following  cycle 1 FOLFOX. Neulasta added with cycle 2. 8. Delayed nausea  following chemotherapy-emend added with cycle 3 FOLFOX 9. Early oxaliplatin neuropathy 10. 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 Xeloda resumed at a reduced dose. 11. Bilateral hand weakness. Question related to previous oxaliplatin. Referred to neurology 06/17/2016. 12. Hypertension-amlodipine started 07/08/2016   Disposition:  [name] appears stable. He will continue every three-week Avastin and Xeloda at the current dose. The hand/foot skin symptoms have improved with the Xeloda dose reduction. The etiology of the bilateral hand weakness remains unclear. He has been referred to neurology.  [name] has hypertension. This may be related to Avastin. He will begin Norvasc.  [name] will return for an office visit and chemotherapy in 3 weeks. 25 minutes were spent with the patient today. The majority of the time was used for counseling and coordination of care.  Betsy Coder, MD  07/08/2016  10:39 AM

## 2016-07-08 NOTE — Telephone Encounter (Signed)
Gave patient avs report and appointments for February.  °

## 2016-07-10 ENCOUNTER — Other Ambulatory Visit: Payer: Self-pay | Admitting: Nurse Practitioner

## 2016-07-28 ENCOUNTER — Other Ambulatory Visit: Payer: Self-pay | Admitting: Oncology

## 2016-07-29 ENCOUNTER — Telehealth: Payer: Self-pay | Admitting: Oncology

## 2016-07-29 ENCOUNTER — Other Ambulatory Visit (HOSPITAL_BASED_OUTPATIENT_CLINIC_OR_DEPARTMENT_OTHER): Payer: BLUE CROSS/BLUE SHIELD

## 2016-07-29 ENCOUNTER — Ambulatory Visit (HOSPITAL_BASED_OUTPATIENT_CLINIC_OR_DEPARTMENT_OTHER): Payer: BLUE CROSS/BLUE SHIELD

## 2016-07-29 ENCOUNTER — Ambulatory Visit: Payer: BLUE CROSS/BLUE SHIELD

## 2016-07-29 ENCOUNTER — Ambulatory Visit (HOSPITAL_BASED_OUTPATIENT_CLINIC_OR_DEPARTMENT_OTHER): Payer: BLUE CROSS/BLUE SHIELD | Admitting: Nurse Practitioner

## 2016-07-29 VITALS — BP 141/96 | HR 63 | Temp 98.4°F | Resp 18

## 2016-07-29 VITALS — BP 120/80

## 2016-07-29 DIAGNOSIS — I10 Essential (primary) hypertension: Secondary | ICD-10-CM

## 2016-07-29 DIAGNOSIS — Z5112 Encounter for antineoplastic immunotherapy: Secondary | ICD-10-CM

## 2016-07-29 DIAGNOSIS — R911 Solitary pulmonary nodule: Secondary | ICD-10-CM

## 2016-07-29 DIAGNOSIS — Z95828 Presence of other vascular implants and grafts: Secondary | ICD-10-CM

## 2016-07-29 DIAGNOSIS — C787 Secondary malignant neoplasm of liver and intrahepatic bile duct: Secondary | ICD-10-CM

## 2016-07-29 DIAGNOSIS — C20 Malignant neoplasm of rectum: Secondary | ICD-10-CM

## 2016-07-29 DIAGNOSIS — D649 Anemia, unspecified: Secondary | ICD-10-CM | POA: Diagnosis not present

## 2016-07-29 DIAGNOSIS — R200 Anesthesia of skin: Secondary | ICD-10-CM

## 2016-07-29 LAB — CBC WITH DIFFERENTIAL/PLATELET
BASO%: 0.5 % (ref 0.0–2.0)
BASOS ABS: 0 10*3/uL (ref 0.0–0.1)
EOS%: 1.6 % (ref 0.0–7.0)
Eosinophils Absolute: 0.1 10*3/uL (ref 0.0–0.5)
HCT: 42.4 % (ref 38.4–49.9)
HEMOGLOBIN: 14.2 g/dL (ref 13.0–17.1)
LYMPH%: 49.5 % — ABNORMAL HIGH (ref 14.0–49.0)
MCH: 34.4 pg — AB (ref 27.2–33.4)
MCHC: 33.5 g/dL (ref 32.0–36.0)
MCV: 102.8 fL — ABNORMAL HIGH (ref 79.3–98.0)
MONO#: 0.4 10*3/uL (ref 0.1–0.9)
MONO%: 7.7 % (ref 0.0–14.0)
NEUT#: 2.3 10*3/uL (ref 1.5–6.5)
NEUT%: 40.7 % (ref 39.0–75.0)
Platelets: 204 10*3/uL (ref 140–400)
RBC: 4.12 10*6/uL — AB (ref 4.20–5.82)
RDW: 17.2 % — AB (ref 11.0–14.6)
WBC: 5.7 10*3/uL (ref 4.0–10.3)
lymph#: 2.9 10*3/uL (ref 0.9–3.3)

## 2016-07-29 LAB — COMPREHENSIVE METABOLIC PANEL
ALBUMIN: 3.8 g/dL (ref 3.5–5.0)
ALT: 9 U/L (ref 0–55)
AST: 19 U/L (ref 5–34)
Alkaline Phosphatase: 66 U/L (ref 40–150)
Anion Gap: 8 mEq/L (ref 3–11)
BUN: 17.8 mg/dL (ref 7.0–26.0)
CHLORIDE: 106 meq/L (ref 98–109)
CO2: 25 meq/L (ref 22–29)
Calcium: 9.2 mg/dL (ref 8.4–10.4)
Creatinine: 1.3 mg/dL (ref 0.7–1.3)
EGFR: 69 mL/min/{1.73_m2} — ABNORMAL LOW (ref >90)
GLUCOSE: 86 mg/dL (ref 70–140)
POTASSIUM: 3.9 meq/L (ref 3.5–5.1)
SODIUM: 140 meq/L (ref 136–145)
Total Bilirubin: 0.42 mg/dL (ref 0.20–1.20)
Total Protein: 8.1 g/dL (ref 6.4–8.3)

## 2016-07-29 LAB — UA PROTEIN, DIPSTICK - CHCC: Protein, ur: NEGATIVE mg/dL

## 2016-07-29 MED ORDER — SODIUM CHLORIDE 0.9% FLUSH
10.0000 mL | INTRAVENOUS | Status: DC | PRN
  Administered 2016-07-29: 10 mL
  Filled 2016-07-29: qty 10

## 2016-07-29 MED ORDER — SODIUM CHLORIDE 0.9 % IV SOLN
Freq: Once | INTRAVENOUS | Status: AC
  Administered 2016-07-29: 12:00:00 via INTRAVENOUS

## 2016-07-29 MED ORDER — HEPARIN SOD (PORK) LOCK FLUSH 100 UNIT/ML IV SOLN
500.0000 [IU] | Freq: Once | INTRAVENOUS | Status: AC | PRN
  Administered 2016-07-29: 500 [IU]
  Filled 2016-07-29: qty 5

## 2016-07-29 MED ORDER — SODIUM CHLORIDE 0.9 % IJ SOLN
10.0000 mL | INTRAMUSCULAR | Status: DC | PRN
  Administered 2016-07-29: 10 mL via INTRAVENOUS
  Filled 2016-07-29: qty 10

## 2016-07-29 MED ORDER — SODIUM CHLORIDE 0.9 % IV SOLN
7.8000 mg/kg | Freq: Once | INTRAVENOUS | Status: AC
  Administered 2016-07-29: 600 mg via INTRAVENOUS
  Filled 2016-07-29: qty 16

## 2016-07-29 NOTE — Patient Instructions (Signed)

## 2016-07-29 NOTE — Patient Instructions (Signed)
[facility] Discharge Instructions for Patients Receiving Chemotherapy  Today you received the following chemotherapy agents Avastin   To help prevent nausea and vomiting after your treatment, we encourage you to take your nausea medication as directed.    If you develop nausea and vomiting that is not controlled by your nausea medication, call the clinic.   BELOW ARE SYMPTOMS THAT SHOULD BE REPORTED IMMEDIATELY:  *FEVER GREATER THAN 100.5 F  *CHILLS WITH OR WITHOUT FEVER  NAUSEA AND VOMITING THAT IS NOT CONTROLLED WITH YOUR NAUSEA MEDICATION  *UNUSUAL SHORTNESS OF BREATH  *UNUSUAL BRUISING OR BLEEDING  TENDERNESS IN MOUTH AND THROAT WITH OR WITHOUT PRESENCE OF ULCERS  *URINARY PROBLEMS  *BOWEL PROBLEMS  UNUSUAL RASH Items with * indicate a potential emergency and should be followed up as soon as possible.  Feel free to call the clinic you have any questions or concerns. The clinic phone number is [phone].  Please show the CHEMO ALERT CARD at check-in to the Emergency Department and triage nurse.   

## 2016-07-29 NOTE — Progress Notes (Signed)
[place] OFFICE PROGRESS NOTE   Diagnosis:  Rectal cancer  INTERVAL HISTORY:   Mr. Divin returns as scheduled. He continues every other week Xeloda and every three-week Avastin. He feels well. No nausea or vomiting. No mouth sores. No diarrhea. No hand or foot pain or redness. He continues to have weakness and numbness in his hands but overall this has improved. He has mild numbness in the feet. He denies bleeding. No shortness of breath or chest pain.  Objective:  Vital signs in last 24 hours:  Blood pressure (!) 141/96, pulse 63, temperature 98.4 F (36.9 C), temperature source Oral, resp. rate 18, SpO2 97 %.    HEENT: No thrush or ulcers. Resp: Lungs clear bilaterally. Cardio: Regular rate and rhythm. GI: Abdomen soft and nontender. No hepatomegaly. Vascular: No leg edema. Skin: Palms without erythema. No skin breakdown.  Port-A-Cath without erythema.  Lab Results:  Lab Results  Component Value Date   WBC 5.7 07/29/2016   HGB 14.2 07/29/2016   HCT 42.4 07/29/2016   MCV 102.8 (H) 07/29/2016   PLT 204 07/29/2016   NEUTROABS 2.3 07/29/2016    Imaging:  No results found.  Medications: I have reviewed the patient's current medications.  Assessment/Plan: 1. Rectal cancer, invasive adenocarcinoma confirmed at colonoscopy 05/22/2015, EUS staging 06/01/2015 revealed a uT3,N1 lesion at 7 cm from the anal verge ? Staging CT abdomen/pelvis 05/15/2015 confirmed a mass at the rectosigmoid colon, a suspicious perirectal lymph node, and a 6 mm right liver lesion felt to most likely be a cyst ? CT chest 05/30/2015 with indeterminate pulmonary nodules and multiple liver lesions suspicious for metastases ? Ultrasound 06/13/2014 with no liver lesions identified ? MRI abdomen 06/21/2015 consistent with multiple liver metastases ? CT biopsy of liver lesion 06/26/2015. Pathology showed metastatic adenocarcinoma consistent with colonic primary. ? Cycle 1 FOLFOX  07/10/2015 ? Cycle 2 FOLFOX plus Avastin 07/24/2015 with Neulasta support ? Cycle 3 FOLFOX plus Avastin 08/07/2015 ? Cycle 4 FOLFOX plus Avastin 08/21/2015 ? cycle 5 FOLFOX plus Avastin 09/04/2015 ? Restaging CTs 09/15/2015 revealed a decrease in the size of liver lesions, decreased rectal primary, stable/decreased size of tiny lung nodules ? Cycle 6 FOLFOX plus Avastin 09/18/2015 ? Cycle 7 FOLFOX plus Avastin 10/02/2015 ? Cycle 8 FOLFOX plus Avastin 10/16/2015 ? Cycle 9 FOLFOX plus Avastin 11/06/2015 ? Cycle 10 FOLFOX plus Avastin 11/20/2015 ? CTs 12/07/2015-mild decrease in wall thickening at the rectum, decrease in tiny hepatic metastases, stable lung nodules, no evidence of progressive metastatic disease, new left lower lobe infiltrate ? Treatment changed to every three-week Avastin with every other week Xeloda beginning 12/11/2015 ? Restaging CTs 04/12/2016-interval increase in size of adjacent right upper lobe pulmonary nodules and right lower lobe pulmonary nodule; unchanged 5 mm lesion right hepatic lobe; no additional visualized hepatic metastasis. Grossly unchanged wall thickening of the rectum. ? Continuation of Xeloda every other week and every three-week Avastin ? Xeloda placed on hold 05/06/2016 due to hand-foot syndrome ? Xeloda resumed 05/28/2016 1500 mg every morning and 1000 mg every afternoon 7 days on/7 days off 2. Multiple colon polyps on the colonoscopy 05/22/2015, tubular villous adenoma and a tubular adenoma were removed 3. Anemia-likely secondary to rectal bleeding.  4. Family history of multiple cancers including breast and prostate cancer 5. Transient right arm numbness 06/20/2015 6. Port-A-Cath placement 07/06/2015 by Dr. Marcello Moores 7. Mild neutropenia secondary to chemotherapy following cycle 1 FOLFOX. Neulasta added with cycle 2. 8. Delayed nausea following chemotherapy-emend added with cycle  3 FOLFOX 9. Early oxaliplatin neuropathy 10. Hand-foot syndrome secondary  to Xeloda 05/06/2016;improved 05/28/2016. Xeloda resumed at a reduced dose. 11. Bilateral hand weakness. Question related to previous oxaliplatin. Referred to neurology 06/17/2016. 12. Hypertension-amlodipine started 07/08/2016     Disposition: Hunter Walker appears stable. There is no clinical evidence of disease progression. Plan to continue every other week Xeloda and every three-week Avastin. He will return for a follow-up visit in 3 weeks. He will contact the office in the interim with any problems.    Ned Card ANP/GNP-BC   07/29/2016  10:32 AM

## 2016-07-29 NOTE — Telephone Encounter (Signed)
Lab, flush, chemo and follow up appointments scheduled for 08/19/16,  per 07/29/16 los. Patient was given a copy of the AVS report and appointment schedule, per 07/29/16 los.

## 2016-08-05 ENCOUNTER — Other Ambulatory Visit: Payer: Self-pay | Admitting: *Deleted

## 2016-08-05 DIAGNOSIS — C20 Malignant neoplasm of rectum: Secondary | ICD-10-CM

## 2016-08-05 MED ORDER — CAPECITABINE 500 MG PO TABS: ORAL_TABLET | ORAL | 0 refills | Status: DC

## 2016-08-09 ENCOUNTER — Other Ambulatory Visit: Payer: Self-pay | Admitting: *Deleted

## 2016-08-09 MED ORDER — AMLODIPINE BESYLATE 5 MG PO TABS: 5.0000 mg | ORAL_TABLET | Freq: Every day | ORAL | 2 refills | Status: DC

## 2016-08-15 ENCOUNTER — Telehealth: Payer: Self-pay

## 2016-08-15 MED ORDER — AMLODIPINE BESYLATE 5 MG PO TABS: 5.0000 mg | ORAL_TABLET | Freq: Every day | ORAL | 2 refills | Status: DC

## 2016-08-15 NOTE — Telephone Encounter (Signed)
Pt called back and let us know norvasc rx should be sent to [facility]. Done and [facility] called to cancel with them.

## 2016-08-15 NOTE — Telephone Encounter (Signed)
[facility] called stating they received rx for norvasc from another pharmacy and needed to verify information and contact pt. The rx was sent to [facility] who I am assuming sent it to [facility].   I called pt to verify if he wants to continue with a mail order pharmacy or if he wants his [facility]vasc rx sent the local [facility] on his profile.  Please call [facility] so they know whether to cancel or continue this rx. [phone]

## 2016-08-18 ENCOUNTER — Other Ambulatory Visit: Payer: Self-pay | Admitting: Oncology

## 2016-08-19 ENCOUNTER — Telehealth: Payer: Self-pay | Admitting: Oncology

## 2016-08-19 ENCOUNTER — Ambulatory Visit (HOSPITAL_BASED_OUTPATIENT_CLINIC_OR_DEPARTMENT_OTHER): Payer: BLUE CROSS/BLUE SHIELD

## 2016-08-19 ENCOUNTER — Ambulatory Visit: Payer: BLUE CROSS/BLUE SHIELD

## 2016-08-19 ENCOUNTER — Other Ambulatory Visit: Payer: Self-pay | Admitting: *Deleted

## 2016-08-19 ENCOUNTER — Other Ambulatory Visit (HOSPITAL_BASED_OUTPATIENT_CLINIC_OR_DEPARTMENT_OTHER): Payer: BLUE CROSS/BLUE SHIELD

## 2016-08-19 ENCOUNTER — Ambulatory Visit (HOSPITAL_BASED_OUTPATIENT_CLINIC_OR_DEPARTMENT_OTHER): Payer: BLUE CROSS/BLUE SHIELD | Admitting: Oncology

## 2016-08-19 VITALS — BP 140/88 | HR 65

## 2016-08-19 VITALS — BP 141/89 | HR 72 | Temp 98.2°F | Resp 18 | Ht 72.0 in | Wt 184.6 lb

## 2016-08-19 DIAGNOSIS — Z79899 Other long term (current) drug therapy: Secondary | ICD-10-CM

## 2016-08-19 DIAGNOSIS — C20 Malignant neoplasm of rectum: Secondary | ICD-10-CM

## 2016-08-19 DIAGNOSIS — C787 Secondary malignant neoplasm of liver and intrahepatic bile duct: Secondary | ICD-10-CM | POA: Diagnosis not present

## 2016-08-19 DIAGNOSIS — I10 Essential (primary) hypertension: Secondary | ICD-10-CM

## 2016-08-19 DIAGNOSIS — Z95828 Presence of other vascular implants and grafts: Secondary | ICD-10-CM

## 2016-08-19 DIAGNOSIS — G609 Hereditary and idiopathic neuropathy, unspecified: Secondary | ICD-10-CM

## 2016-08-19 DIAGNOSIS — Z5112 Encounter for antineoplastic immunotherapy: Secondary | ICD-10-CM

## 2016-08-19 LAB — CBC WITH DIFFERENTIAL/PLATELET
BASO%: 0.5 % (ref 0.0–2.0)
Basophils Absolute: 0 10*3/uL (ref 0.0–0.1)
EOS ABS: 0.1 10*3/uL (ref 0.0–0.5)
EOS%: 1.2 % (ref 0.0–7.0)
HCT: 43.4 % (ref 38.4–49.9)
HEMOGLOBIN: 14.3 g/dL (ref 13.0–17.1)
LYMPH%: 50.4 % — AB (ref 14.0–49.0)
MCH: 33.4 pg (ref 27.2–33.4)
MCHC: 32.9 g/dL (ref 32.0–36.0)
MCV: 101.4 fL — AB (ref 79.3–98.0)
MONO#: 0.5 10*3/uL (ref 0.1–0.9)
MONO%: 8.2 % (ref 0.0–14.0)
NEUT%: 39.7 % (ref 39.0–75.0)
NEUTROS ABS: 2.4 10*3/uL (ref 1.5–6.5)
Platelets: 192 10*3/uL (ref 140–400)
RBC: 4.28 10*6/uL (ref 4.20–5.82)
RDW: 16.3 % — ABNORMAL HIGH (ref 11.0–14.6)
WBC: 6 10*3/uL (ref 4.0–10.3)
lymph#: 3 10*3/uL (ref 0.9–3.3)

## 2016-08-19 LAB — COMPREHENSIVE METABOLIC PANEL
ALBUMIN: 3.9 g/dL (ref 3.5–5.0)
ALK PHOS: 67 U/L (ref 40–150)
ALT: 13 U/L (ref 0–55)
ANION GAP: 8 meq/L (ref 3–11)
AST: 18 U/L (ref 5–34)
BILIRUBIN TOTAL: 0.45 mg/dL (ref 0.20–1.20)
BUN: 16.4 mg/dL (ref 7.0–26.0)
CO2: 25 mEq/L (ref 22–29)
Calcium: 9.7 mg/dL (ref 8.4–10.4)
Chloride: 104 mEq/L (ref 98–109)
Creatinine: 1.4 mg/dL — ABNORMAL HIGH (ref 0.7–1.3)
EGFR: 64 mL/min/{1.73_m2} — AB (ref >90)
Glucose: 119 mg/dl (ref 70–140)
Potassium: 4.1 mEq/L (ref 3.5–5.1)
SODIUM: 137 meq/L (ref 136–145)
TOTAL PROTEIN: 8.4 g/dL — AB (ref 6.4–8.3)

## 2016-08-19 LAB — UA PROTEIN, DIPSTICK - CHCC: Protein, ur: NEGATIVE mg/dL

## 2016-08-19 LAB — CEA (IN HOUSE-CHCC): CEA (CHCC-IN HOUSE): 2.27 ng/mL (ref 0.00–5.00)

## 2016-08-19 MED ORDER — SODIUM CHLORIDE 0.9% FLUSH
10.0000 mL | INTRAVENOUS | Status: DC | PRN
  Administered 2016-08-19: 10 mL
  Filled 2016-08-19: qty 10

## 2016-08-19 MED ORDER — SODIUM CHLORIDE 0.9 % IV SOLN: 7.5000 mg/kg | Freq: Once | INTRAVENOUS | Status: DC

## 2016-08-19 MED ORDER — SODIUM CHLORIDE 0.9 % IJ SOLN
10.0000 mL | INTRAMUSCULAR | Status: DC | PRN
  Administered 2016-08-19: 10 mL via INTRAVENOUS
  Filled 2016-08-19: qty 10

## 2016-08-19 MED ORDER — PROCHLORPERAZINE MALEATE 10 MG PO TABS: ORAL_TABLET | ORAL | 2 refills | Status: DC

## 2016-08-19 MED ORDER — SODIUM CHLORIDE 0.9 % IV SOLN
Freq: Once | INTRAVENOUS | Status: AC
  Administered 2016-08-19: 12:00:00 via INTRAVENOUS

## 2016-08-19 MED ORDER — SODIUM CHLORIDE 0.9 % IV SOLN
600.0000 mg | Freq: Once | INTRAVENOUS | Status: AC
  Administered 2016-08-19: 600 mg via INTRAVENOUS
  Filled 2016-08-19: qty 16

## 2016-08-19 MED ORDER — HEPARIN SOD (PORK) LOCK FLUSH 100 UNIT/ML IV SOLN
500.0000 [IU] | Freq: Once | INTRAVENOUS | Status: AC | PRN
  Administered 2016-08-19: 500 [IU]
  Filled 2016-08-19: qty 5

## 2016-08-19 NOTE — Patient Instructions (Signed)
Implanted Port Home Guide An implanted port is a type of central line that is placed under the skin. Central lines are used to provide IV access when treatment or nutrition needs to be given through a person's veins. Implanted ports are used for long-term IV access. An implanted port may be placed because:  You need IV medicine that would be irritating to the small veins in your hands or arms.  You need long-term IV medicines, such as antibiotics.  You need IV nutrition for a long period.  You need frequent blood draws for lab tests.  You need dialysis.  Implanted ports are usually placed in the chest area, but they can also be placed in the upper arm, the abdomen, or the leg. An implanted port has two main parts:  Reservoir. The reservoir is round and will appear as a small, raised area under your skin. The reservoir is the part where a needle is inserted to give medicines or draw blood.  Catheter. The catheter is a thin, flexible tube that extends from the reservoir. The catheter is placed into a large vein. Medicine that is inserted into the reservoir goes into the catheter and then into the vein.  How will I care for my incision site? Do not get the incision site wet. Bathe or shower as directed by your health care provider. How is my port accessed? Special steps must be taken to access the port:  Before the port is accessed, a numbing cream can be placed on the skin. This helps numb the skin over the port site.  Your health care provider uses a sterile technique to access the port. ? Your health care provider must put on a mask and sterile gloves. ? The skin over your port is cleaned carefully with an antiseptic and allowed to dry. ? The port is gently pinched between sterile gloves, and a needle is inserted into the port.  Only "non-coring" port needles should be used to access the port. Once the port is accessed, a blood return should be checked. This helps ensure that the port  is in the vein and is not clogged.  If your port needs to remain accessed for a constant infusion, a clear (transparent) bandage will be placed over the needle site. The bandage and needle will need to be changed every week, or as directed by your health care provider.  Keep the bandage covering the needle clean and dry. Do not get it wet. Follow your health care provider's instructions on how to take a shower or bath while the port is accessed.  If your port does not need to stay accessed, no bandage is needed over the port.  What is flushing? Flushing helps keep the port from getting clogged. Follow your health care provider's instructions on how and when to flush the port. Ports are usually flushed with saline solution or a medicine called heparin. The need for flushing will depend on how the port is used.  If the port is used for intermittent medicines or blood draws, the port will need to be flushed: ? After medicines have been given. ? After blood has been drawn. ? As part of routine maintenance.  If a constant infusion is running, the port may not need to be flushed.  How long will my port stay implanted? The port can stay in for as long as your health care provider thinks it is needed. When it is time for the port to come out, surgery will be   done to remove it. The procedure is similar to the one performed when the port was put in. When should I seek immediate medical care? When you have an implanted port, you should seek immediate medical care if:  You notice a bad smell coming from the incision site.  You have swelling, redness, or drainage at the incision site.  You have more swelling or pain at the port site or the surrounding area.  You have a fever that is not controlled with medicine.  This information is not intended to replace advice given to you by your health care provider. Make sure you discuss any questions you have with your health care provider. Document  Released: 05/20/2005 Document Revised: 10/26/2015 Document Reviewed: 01/25/2013 Elsevier Interactive Patient Education  2017 [employer]  

## 2016-08-19 NOTE — Patient Instructions (Signed)
[facility] Discharge Instructions for Patients Receiving Chemotherapy  Today you received the following chemotherapy agents Avastin   To help prevent nausea and vomiting after your treatment, we encourage you to take your nausea medication as directed.    If you develop nausea and vomiting that is not controlled by your nausea medication, call the clinic.   BELOW ARE SYMPTOMS THAT SHOULD BE REPORTED IMMEDIATELY:  *FEVER GREATER THAN 100.5 F  *CHILLS WITH OR WITHOUT FEVER  NAUSEA AND VOMITING THAT IS NOT CONTROLLED WITH YOUR NAUSEA MEDICATION  *UNUSUAL SHORTNESS OF BREATH  *UNUSUAL BRUISING OR BLEEDING  TENDERNESS IN MOUTH AND THROAT WITH OR WITHOUT PRESENCE OF ULCERS  *URINARY PROBLEMS  *BOWEL PROBLEMS  UNUSUAL RASH Items with * indicate a potential emergency and should be followed up as soon as possible.  Feel free to call the clinic you have any questions or concerns. The clinic phone number is [phone].  Please show the CHEMO ALERT CARD at check-in to the Emergency Department and triage nurse.   

## 2016-08-19 NOTE — Telephone Encounter (Signed)
Gave patient AVS and calender per 08/19/2016 los. Scheduled appts per los. Treatment was not on [place] but patient said there was suppose to be a treatment and it is on the Active Treatment day plan - Scheduled treatment per Treatment plan.

## 2016-08-19 NOTE — Progress Notes (Signed)
[place] OFFICE PROGRESS NOTE   Diagnosis: Colon cancer  INTERVAL HISTORY:   Hunter Walker returns as scheduled. He continues every three-week Avastin. He is taking Xeloda every other week. No mouth sores or diarrhea. He feels well. He is exercising. The numbness and stiffness in the hands has improved. He has dryness with skin desquamation at the feet.  Objective:  Vital signs in last 24 hours:  Blood pressure (!) 141/89, pulse 72, temperature 98.2 F (36.8 C), temperature source Oral, resp. rate 18, height 6' (1.829 m), weight 184 lb 9.6 oz (83.7 kg), SpO2 97 %.    HEENT: No thrush or ulcers Resp: Lungs clear bilaterally Cardio: Regular rate and rhythm GI: No hepatosplenomegaly, nontender Vascular: No leg edema  Skin: Hyperpigmentation of the hands and feet, dry superficial desquamation at the soles   Portacath/PICC-without erythema  Lab Results:  Lab Results  Component Value Date   WBC 6.0 08/19/2016   HGB 14.3 08/19/2016   HCT 43.4 08/19/2016   MCV 101.4 (H) 08/19/2016   PLT 192 08/19/2016   NEUTROABS 2.4 08/19/2016   CEA on 07/08/2016-1.23  Medications: I have reviewed the patient's current medications.  Assessment/Plan: 1. Rectal cancer, invasive adenocarcinoma confirmed at colonoscopy 05/22/2015, EUS staging 06/01/2015 revealed a uT3,N1 lesion at 7 cm from the anal verge  Staging CT abdomen/pelvis 05/15/2015 confirmed a mass at the rectosigmoid colon, a suspicious perirectal lymph node, and a 6 mm right liver lesion felt to most likely be a cyst  CT chest 05/30/2015 with indeterminate pulmonary nodules and multiple liver lesions suspicious for metastases  Ultrasound 06/13/2014 with no liver lesions identified  MRI abdomen 06/21/2015 consistent with multiple liver metastases  CT biopsy of liver lesion 06/26/2015. Pathology showed metastatic adenocarcinoma consistent with colonic primary.  Cycle 1 FOLFOX 07/10/2015  Cycle 2 FOLFOX plus  Avastin 07/24/2015 with Neulasta support  Cycle 3 FOLFOX plus Avastin 08/07/2015  Cycle 4 FOLFOX plus Avastin 08/21/2015  cycle 5 FOLFOX plus Avastin 09/04/2015  Restaging CTs 09/15/2015 revealed a decrease in the size of liver lesions, decreased rectal primary, stable/decreased size of tiny lung nodules  Cycle 6 FOLFOX plus Avastin 09/18/2015  Cycle 7 FOLFOX plus Avastin 10/02/2015  Cycle 8 FOLFOX plus Avastin 10/16/2015  Cycle 9 FOLFOX plus Avastin 11/06/2015  Cycle 10 FOLFOX plus Avastin 11/20/2015  CTs 12/07/2015-mild decrease in wall thickening at the rectum, decrease in tiny hepatic metastases, stable lung nodules, no evidence of progressive metastatic disease, new left lower lobe infiltrate  Treatment changed to every three-week Avastin with every other week Xeloda beginning 12/11/2015  Restaging CTs 04/12/2016-interval increase in size of adjacent right upper lobe pulmonary nodules and right lower lobe pulmonary nodule; unchanged 5 mm lesion right hepatic lobe; no additional visualized hepatic metastasis. Grossly unchanged wall thickening of the rectum.  Continuation of Xeloda every other week and every three-week AvastinXeloda placed on hold 05/06/2016 due to hand-foot syndrome  Xeloda resumed 05/28/2016 1500 mg every morning and 1000 mg every afternoon 7 days on/7 days off 2.Multiple colon polyps on the colonoscopy 05/22/2015, tubular villous adenoma and a tubular adenoma were removed 3.Anemia-likely secondary to rectal bleeding.  4.Family history of multiple cancers including breast and prostate cancer 5.Transient right arm numbness 06/20/2015 6. Port-A-Cath placement 07/06/2015 by Dr. Marcello Moores 7. Mild neutropenia secondary to chemotherapy following cycle 1 FOLFOX. Neulasta added with cycle 2. 8. Delayed nausea following chemotherapy-emend added with cycle 3 FOLFOX 9. Early oxaliplatin neuropathy 10. Hand-foot syndrome secondary to Xeloda 05/06/2016;improved  05/28/2016.  Xeloda resumed at a reduced dose. 11. Bilateral hand weakness. Question related to previous oxaliplatin. Referred to neurology 06/17/2016. 12. Hypertension-amlodipine started 07/08/2016     Disposition:  Hunter Walker appears well. The plan is to continue Xeloda/Avastin. He will return for an office visit in 3 weeks. The plan is to schedule a restaging CT within the next 1-2 months.  15 minutes were spent with the patient today. The majority of the time was used for counseling and coordination of care.  Hunter Coder, MD  08/19/2016  10:32 AM

## 2016-08-26 ENCOUNTER — Encounter: Payer: Self-pay | Admitting: Neurology

## 2016-08-26 ENCOUNTER — Ambulatory Visit (INDEPENDENT_AMBULATORY_CARE_PROVIDER_SITE_OTHER): Payer: BLUE CROSS/BLUE SHIELD | Admitting: Neurology

## 2016-08-26 VITALS — BP 130/80 | HR 92 | Ht 72.0 in | Wt 185.1 lb

## 2016-08-26 DIAGNOSIS — G620 Drug-induced polyneuropathy: Secondary | ICD-10-CM | POA: Diagnosis not present

## 2016-08-26 DIAGNOSIS — R202 Paresthesia of skin: Secondary | ICD-10-CM | POA: Diagnosis not present

## 2016-08-26 DIAGNOSIS — R29898 Other symptoms and signs involving the musculoskeletal system: Secondary | ICD-10-CM | POA: Diagnosis not present

## 2016-08-26 DIAGNOSIS — T451X5A Adverse effect of antineoplastic and immunosuppressive drugs, initial encounter: Principal | ICD-10-CM

## 2016-08-26 NOTE — Patient Instructions (Addendum)
1.  Check blood work 2.  NCS/EMG of the right side 3.  Continue gabapentin 300mg  at bedtime  Return to clinic in 6 months  [facility]  Preventing Falls in the Home   Falls are common, often dreaded events in the lives of older people. Aside from the obvious injuries and even death that may result, falls can cause wide-ranging consequences including loss of independence, mental decline, decreased activity, and mobility. Younger people are also at risk of falling, especially those with chronic illnesses and fatigue.  Ways to reduce the risk for falling:  * Examine diet and medications. Warm foods and alcohol dilate blood vessels, which can lead to dizziness when standing. Sleep aids, antidepressants, and pain medications can also increase the likelihood of a fall.  * Get a vison exam. Poor vision, cataracts, and glaucoma increase the chances of falling.  * Check foot gear. Shoes should fit snugly and have a sturdy, nonskid sole and broad, low heel.  * Participate in a physician-approved exercise program to build and maintain muscle strength and improve balance and coordination.  * Increase vitamin D intake. Vitamin D improves muscle strength and increases the amount of calcium the body is able to absorb and deposit in bones.  How to prevent falls from common hazards:  * Floors - Remove all loose wires, cords, and throw rugs. Minimize clutter. Make sure rugs are anchored and smooth. Keep furniture in its usual place.  * Chairs - Use chairs with straight backs, armrests, and firm seats. Add firm cushions to existing pieces to add height.  * Bathroom - Install grab bars and non-skid tape in the tub or shower. Use a bathtub transfer bench or a shower chair with a back support. Use an elevated toilet seat and/or safety rails to assist standing from a low surface. Do not use towel racks or bathroom tissue holders to help you stand.  * Lighting - Make sure halls, stairways, and entrances are  well-lit. Install a night light in your bathroom or hallway. Make sure there is a light switch at the top and bottom of the staircase. Turn lights on if you get up in the middle of the night. Make sure lamps or light switches are within reach of the bed if you have to get up during the night.  * Kitchen - Install non-skid rubber mats near the sink and stove. Clean spills immediately. Store frequently used utensils, pots, and pans between waist and eye level. This helps prevent reaching and bending. Sit when getting things out of the lower cupboards.  * Living room / [place] furniture with wide spaces in between, giving enough room to move around. Establish a route through the living room that gives you something to hold onto as you walk.  * Stairs - Make sure treads, rails, and rugs are secure. Install a rail on both sides of the stairs. If stairs are a threat, it might be helpful to arrange most of your activities on the lower level to reduce the number of times you must climb the stairs.  * Entrances and doorways - Install metal handles on the walls adjacent to the doorknobs of all doors to make it more secure as you travel through the doorway.  Tips for maintaining balance:  * Keep at least one hand free at all times Try using a backpack or fanny pack to hold things rather than carrying them in your hands. Never carry objects in both hands when walking as  this interferes with keeping your balance.  * Attempt to swing both arms from front to back while walking. This might require a conscious effort if Parkinson's disease has diminished your movement. It will, however, help you to maintain balance and posture, and reduce fatigue.  * Consciously lift your feet off the ground when walking. Shuffling and dragging of the feet is a common culprit in losing your balance.  * When trying to navigate turns, use a "U" technique of facing forward and making a wide turn, rather than pivoting sharply.  * Try  to stand with your feet shoulder-length apart. When your feet are close together for any length of time, you increase your risk of losing your balance and falling.  * Do one thing at a time. Do not try to walk and accomplish another task, such as reading or looking around. The decrease in your automatic reflexes complicates motor function, so the less distraction, the better.  * Do not wear rubber or gripping soled shoes, they might "catch" on the floor and cause tripping.  * Move slowly when changing positions. Use deliberate, concentrated movements and, if needed, use a grab bar or walking aid. Count fifteen (15) seconds after standing to begin walking.  * If balance is a continuous problem, you might want to consider a walking aid such as a cane, walking stick, or walker. Once you have mastered walking with help, you may be ready to try it again on your own.  This information is provided by [facility] and is not intended to replace the medical advice of your physician or other health care providers. Please consult your physician or other health care providers for advice regarding your specific medical condition.

## 2016-08-26 NOTE — Progress Notes (Signed)
[facility] Clinic Note - Initial Visit   Date: 08/26/16  Hunter Walker MRN: [id number] DOB: Sep 28, 1950   Dear Benay Spice:  Thank you for your kind referral of Hunter Walker for consultation of chemotherapy induced neuropathy. Although his history is well known to you, please allow [place] to reiterate it for the purpose of our medical record. The patient was accompanied to the clinic by self.   History of Present Illness: Hunter Walker is a 65 y.o. right-handed African American male with rectal cancer (2017) presenting for evaluation of bilateral hand weakness.    He was diagnosed with rectal cancer in December 2016 and started chemotherapy with FOLFOX and avastin in February 2017.  He completed 10 cycles of FOLFOX, last in June 2017.   During his chemotherapy, he began noticing tingling and numbness involving the feet and fingertips.  He did not notice any improvement since stopping chemotherapy. He takes gabapentin 300mg  at bedtime which helps.  In July 2017, he was started on Xeloda (capecitabine) and it was held briefly in December 2017 due to hand-foot syndrome. Xeloda was restarted at a lower dose at the end of December 2017.  He feels that once he started Xeloda symptoms have progressed and he also has some weakness of the hands.  Fine motor tasks such as fastening buttons is difficult.  He has been referred to neurology to further evaluation is hand weakness and neuropathy.  Additionally, he complains of difficulty making a first with the left hand because his hands feel like they are stuck and do not move past a fixed point.  He also complains of reduced range of motion of the wrists and left shoulder.    Out-side paper records, electronic medical record, and images have been reviewed where available and summarized as:  Lab Results  Component Value Date   TSH 0.807 07/30/2015     Past Medical History:  Diagnosis Date  . Allergic rhinitis   .  Cancer [facility])    rectal cancer  . GERD (gastroesophageal reflux disease)   . Hemorrhoids     Past Surgical History:  Procedure Laterality Date  . EUS N/A 06/01/2015   Procedure: LOWER ENDOSCOPIC ULTRASOUND (EUS);  Surgeon: Milus Banister, MD;  Location: [facility];  Service: Endoscopy;  Laterality: N/A;  . LIVER BIOPSY  06/26/2015  . NO PAST SURGERIES    . PORTACATH PLACEMENT Right 07/06/2015   Procedure: INSERTION PORT-A-CATH;  Surgeon: Leighton Ruff, MD;  Location: [facility];  Service: General;  Laterality: Right;     Medications:  Outpatient Encounter Prescriptions as of 08/26/2016  Medication Sig  . amLODipine (NORVASC) 5 MG tablet Take 1 tablet (5 mg total) by mouth daily.  . bisacodyl ([employer] GENTLE LAXATIVE) 5 MG EC tablet Take 1 tablet (5 mg total) by mouth daily as needed (constipation).  . capecitabine (XELODA) 500 MG tablet TAKE 3 TABLETS BY MOUTH IN THE MORNING AND 2 TABLETS IN THE EVENING ON DAYS 1-7 AND 15-21 (ONE WEEK ON, ONE WEEK OFF)  . ferrous sulfate 325 (65 FE) MG tablet Take 325 mg by mouth daily with breakfast.   . gabapentin (NEURONTIN) 300 MG capsule Take 1-2 capsules (300-600 mg total) by mouth at bedtime.  . magic mouthwash SOLN Take 5 mLs by mouth 4 (four) times daily as needed for mouth pain.  . pantoprazole (PROTONIX) 40 MG tablet TAKE 1 TABLET BY MOUTH EVERY DAY  . prochlorperazine (COMPAZINE) 10 MG tablet TAKE 1 TABLET (10 MG  TOTAL) BY MOUTH EVERY 6 (SIX) HOURS AS NEEDED FOR NAUSEA OR VOMITING.   No facility-administered encounter medications on file as of 08/26/2016.      Allergies: No Known Allergies  Family History: Family History  Problem Relation Age of Onset  . Breast cancer Sister     x 2  . Diabetes Mother   . Kidney disease Mother   . Prostate cancer Father   . Diabetes Father   . Prostate cancer Brother   . Diabetes Sister   . Heart disease Brother   . Colon cancer Neg Hx     Social History: Social History  Substance Use Topics  .  Smoking status: Former Smoker    Packs/day: 1.00    Years: 10.00    Types: Cigarettes    Quit date: 06/03/1978  . Smokeless tobacco: Never Used  . Alcohol use 0.0 oz/week     Comment: occ   Social History   Social History Narrative   Married, wife Sharyn Lull.  Lives in a 2 story home.     #2 daughters: Mendel Ryder and Waynard Reeds   Retired Pharmacist, hospital.  Education: college          Review of Systems:  CONSTITUTIONAL: No fevers, chills, night sweats, or weight loss.   EYES: No visual changes or eye pain ENT: No hearing changes.  No history of nose bleeds.   RESPIRATORY: No cough, wheezing and shortness of breath.   CARDIOVASCULAR: Negative for chest pain, and palpitations.   GI: Negative for abdominal discomfort, blood in stools or black stools.  No recent change in bowel habits.   GU:  No history of incontinence.   MUSCLOSKELETAL: +history of joint pain or swelling.  No myalgias.   SKIN: Negative for lesions, rash, and itching.   HEMATOLOGY/ONCOLOGY: Negative for prolonged bleeding, bruising easily, and swollen nodes.  +history of cancer.   ENDOCRINE: Negative for cold or heat intolerance, polydipsia or goiter.   PSYCH:  No depression or anxiety symptoms.   NEURO: As Above.   Vital Signs:  BP 130/80   Pulse 92   Ht 6' (1.829 m)   Wt 185 lb 2 oz (84 kg)   SpO2 98%   BMI 25.11 kg/m    General Medical Exam:   General:  Well appearing, comfortable.   Eyes/ENT: see cranial nerve examination.   Neck: No masses appreciated.  Full range of motion without tenderness.  No carotid bruits. Respiratory:  Clear to auscultation, good air entry bilaterally.   Cardiac:  Regular rate and rhythm, no murmur.   Extremities:  Left hand with arthritic change limiting complete closure of the first.    Skin:  No rashes or lesions.  Neurological Exam: MENTAL STATUS including orientation to time, place, person, recent and remote memory, attention span and concentration, language, and fund of  knowledge is normal.  Speech is not dysarthric.  CRANIAL NERVES: II:  No visual field defects.  Unremarkable fundi.   III-IV-VI: Pupils equal round and reactive to light.  Normal conjugate, extra-ocular eye movements in all directions of gaze.  No nystagmus.  No ptosis.   V:  Normal facial sensation.   VII:  Normal facial symmetry and movements.  No pathologic facial reflexes.  VIII:  Normal hearing and vestibular function.   IX-X:  Normal palatal movement.   XI:  Normal shoulder shrug and head rotation.   XII:  Normal tongue strength and range of motion, no deviation or fasciculation.  MOTOR:  No atrophy, fasciculations  or abnormal movements.  No pronator drift.  Tone is normal.    Right Upper Extremity:    Left Upper Extremity:    Deltoid  5/5   Deltoid  5/5   Biceps  5/5   Biceps  5/5   Triceps  5/5   Triceps  5/5   Wrist extensors  5/5   Wrist extensors  5/5   Wrist flexors  5/5   Wrist flexors  5/5   Finger extensors  5/5   Finger extensors  5/5   Finger flexors  5/5   Finger flexors  5/5   Dorsal interossei  4+/5   Dorsal interossei  4+/5   Abductor pollicis  5/5   Abductor pollicis  5/5   Tone (Ashworth scale)  0  Tone (Ashworth scale)  0   Right Lower Extremity:    Left Lower Extremity:    Hip flexors  5/5   Hip flexors  5/5   Hip extensors  5/5   Hip extensors  5/5   Knee flexors  5/5   Knee flexors  5/5   Knee extensors  5/5   Knee extensors  5/5   Dorsiflexors  5/5   Dorsiflexors  5/5   Plantarflexors  5/5   Plantarflexors  5/5   Toe extensors  5/5   Toe extensors  5/5   Toe flexors  5/5   Toe flexors  5/5   Tone (Ashworth scale)  0  Tone (Ashworth scale)  0   MSRs:  Right                                                                 Left brachioradialis 2+  brachioradialis 2+  biceps 2+  biceps 2+  triceps 2+  triceps 2+  patellar 2+  patellar 2+  ankle jerk 2+  ankle jerk 2+  Hoffman no  Hoffman no  plantar response down  plantar response down   SENSORY:   Vibration is reduced to 20% at the ankles and great toe bilaterally, 100% at the knees and MCP in the hands.  Pin prick, temperature, and light touch intact throughout.  Romberg's sign is present.   COORDINATION/GAIT: Normal finger-to- nose-finger.  Finger taping is mildly slowed bilaterally.  Able to rise from a chair without using arms.  Gait narrow based and stable. Tandem and stressed gait intact.    IMPRESSION: Mr. Vanatta is a 66 year-old gentleman with metastatic rectal cancer (05/2015) who underwent chemotherapy with FOLFOX x 10 cycles from February - June 2017, now on Xeloda referred for evaluation of bilateral hand and feet paresthesias.  His exam shows distal sensory loss involving the feet and weakness of the intrinsic hand muscles.  The glove-stocking distribution of his symptoms is most consistent with oxaliplatin-induced neuropathy.  Reflexes are preserved which is atypical and may suggest more small fiber involvement or there maybe overlapping cervical canal stenosis.    First step to is determine the severity of neuropathy and to assess for other processes such as C8 radiculopathy (bilateral hand weakness) with NCS/EMG of the right arm and leg.  Review of the literature of Xeloda shows that large fiber neuropathy is very uncommon complication and tends to manifest with hand-foot syndrome. There is a case report in the  literature suggesting small-fiber neuropathy is a likely cause of the neuropathic symptoms encountered in capecitabine-induced hand-foot syndrome; thus, if his NCS/EMG is not diagnostic, skin biopsy may be the next step to assess small fiber neuropathy.  Given the incidence of neuropathy is infrequent with Xeloda, I suspect his symptoms are stemming moreso from lingering effects of oxaliplatin.    To be complete, I will screen him for other treatable causes of neuropathy such as vitamin B12, vitamin B1, vitamin B6, folate, copper deficiencies.    His painful paresthesias  are well-controlled on gabapentin 300mg  at bedtime and he does not wish to increase this.    His exam also shows polyarthralgia with reduced range of motion of the left hand finger flexors and left shoulder for which he may need rheumatological/orthopaedic evaluation, but I will defer this to his primary team.   Return to clinic in 6 months, or sooner based on his evaluation   The duration of this appointment visit was 50 minutes of face-to-face time with the patient.  Greater than 50% of this time was spent in counseling, explanation of diagnosis, planning of further management, and coordination of care.   Thank you for allowing me to participate in patient's care.  If I can answer any additional questions, I would be pleased to do so.    Sincerely,    Tayvia Faughnan K. Posey Pronto, DO

## 2016-09-08 ENCOUNTER — Other Ambulatory Visit: Payer: Self-pay | Admitting: Oncology

## 2016-09-09 ENCOUNTER — Other Ambulatory Visit (HOSPITAL_BASED_OUTPATIENT_CLINIC_OR_DEPARTMENT_OTHER): Payer: BLUE CROSS/BLUE SHIELD

## 2016-09-09 ENCOUNTER — Other Ambulatory Visit: Payer: BLUE CROSS/BLUE SHIELD

## 2016-09-09 ENCOUNTER — Ambulatory Visit (HOSPITAL_BASED_OUTPATIENT_CLINIC_OR_DEPARTMENT_OTHER): Payer: BLUE CROSS/BLUE SHIELD | Admitting: Nurse Practitioner

## 2016-09-09 ENCOUNTER — Other Ambulatory Visit: Payer: Self-pay | Admitting: *Deleted

## 2016-09-09 ENCOUNTER — Telehealth: Payer: Self-pay | Admitting: Oncology

## 2016-09-09 ENCOUNTER — Ambulatory Visit: Payer: BLUE CROSS/BLUE SHIELD

## 2016-09-09 ENCOUNTER — Ambulatory Visit (HOSPITAL_BASED_OUTPATIENT_CLINIC_OR_DEPARTMENT_OTHER): Payer: BLUE CROSS/BLUE SHIELD

## 2016-09-09 VITALS — BP 143/89 | HR 67 | Temp 98.2°F | Resp 16

## 2016-09-09 VITALS — BP 154/85 | HR 65 | Temp 98.0°F | Resp 18 | Ht 72.0 in | Wt 184.9 lb

## 2016-09-09 DIAGNOSIS — C787 Secondary malignant neoplasm of liver and intrahepatic bile duct: Secondary | ICD-10-CM | POA: Diagnosis not present

## 2016-09-09 DIAGNOSIS — G620 Drug-induced polyneuropathy: Secondary | ICD-10-CM

## 2016-09-09 DIAGNOSIS — Z5112 Encounter for antineoplastic immunotherapy: Secondary | ICD-10-CM

## 2016-09-09 DIAGNOSIS — C20 Malignant neoplasm of rectum: Secondary | ICD-10-CM

## 2016-09-09 DIAGNOSIS — T451X5A Adverse effect of antineoplastic and immunosuppressive drugs, initial encounter: Principal | ICD-10-CM

## 2016-09-09 DIAGNOSIS — Z95828 Presence of other vascular implants and grafts: Secondary | ICD-10-CM

## 2016-09-09 LAB — CBC WITH DIFFERENTIAL/PLATELET
BASO%: 0.4 % (ref 0.0–2.0)
BASOS ABS: 0 10*3/uL (ref 0.0–0.1)
EOS%: 1.6 % (ref 0.0–7.0)
Eosinophils Absolute: 0.1 10*3/uL (ref 0.0–0.5)
HEMATOCRIT: 43.2 % (ref 38.4–49.9)
HGB: 14.3 g/dL (ref 13.0–17.1)
LYMPH#: 2.5 10*3/uL (ref 0.9–3.3)
LYMPH%: 43.4 % (ref 14.0–49.0)
MCH: 34.2 pg — AB (ref 27.2–33.4)
MCHC: 33.2 g/dL (ref 32.0–36.0)
MCV: 102.9 fL — ABNORMAL HIGH (ref 79.3–98.0)
MONO#: 0.4 10*3/uL (ref 0.1–0.9)
MONO%: 7 % (ref 0.0–14.0)
NEUT#: 2.7 10*3/uL (ref 1.5–6.5)
NEUT%: 47.6 % (ref 39.0–75.0)
Platelets: 231 10*3/uL (ref 140–400)
RBC: 4.2 10*6/uL (ref 4.20–5.82)
RDW: 17.8 % — ABNORMAL HIGH (ref 11.0–14.6)
WBC: 5.8 10*3/uL (ref 4.0–10.3)

## 2016-09-09 LAB — UA PROTEIN, DIPSTICK - CHCC: Protein, ur: NEGATIVE mg/dL

## 2016-09-09 LAB — CEA (IN HOUSE-CHCC): CEA (CHCC-IN HOUSE): 2.68 ng/mL (ref 0.00–5.00)

## 2016-09-09 MED ORDER — SODIUM CHLORIDE 0.9 % IJ SOLN
10.0000 mL | INTRAMUSCULAR | Status: DC | PRN
  Administered 2016-09-09: 10 mL via INTRAVENOUS
  Filled 2016-09-09: qty 10

## 2016-09-09 MED ORDER — SODIUM CHLORIDE 0.9 % IV SOLN
Freq: Once | INTRAVENOUS | Status: AC
  Administered 2016-09-09: 15:00:00 via INTRAVENOUS

## 2016-09-09 MED ORDER — SODIUM CHLORIDE 0.9 % IV SOLN
600.0000 mg | Freq: Once | INTRAVENOUS | Status: AC
  Administered 2016-09-09: 600 mg via INTRAVENOUS
  Filled 2016-09-09: qty 8

## 2016-09-09 MED ORDER — HEPARIN SOD (PORK) LOCK FLUSH 100 UNIT/ML IV SOLN
500.0000 [IU] | Freq: Once | INTRAVENOUS | Status: AC | PRN
  Administered 2016-09-09: 500 [IU]
  Filled 2016-09-09: qty 5

## 2016-09-09 MED ORDER — SODIUM CHLORIDE 0.9% FLUSH
10.0000 mL | INTRAVENOUS | Status: DC | PRN
  Administered 2016-09-09: 10 mL
  Filled 2016-09-09: qty 10

## 2016-09-09 NOTE — Progress Notes (Signed)
[facility] OFFICE PROGRESS NOTE   Diagnosis:  Colon cancer  INTERVAL HISTORY:   Hunter Walker returns as scheduled. He continues every other week Xeloda and every three-week Avastin. He denies nausea. No mouth sores. No diarrhea. No redness or pain involving the hands or feet. The numbness in his feet is stable to improved. He continues to have tingling in the fingertips. He has occasional hemorrhoidal bleeding. No other bleeding. No shortness of breath or chest pain.  Objective:  Vital signs in last 24 hours:  Blood pressure (!) 154/85, pulse 65, temperature 98 F (36.7 C), temperature source Oral, resp. rate 18, height 6' (1.829 m), weight 184 lb 14.4 oz (83.9 kg), SpO2 100 %.    HEENT: No thrush or ulcers. Resp: Lungs clear bilaterally. Cardio: Regular rate and rhythm. GI: Abdomen soft and nontender. No hepatomegaly. Vascular: No leg edema. Skin: Palms without erythema. Port-A-Cath without erythema.    Lab Results:  Lab Results  Component Value Date   WBC 5.8 09/09/2016   HGB 14.3 09/09/2016   HCT 43.2 09/09/2016   MCV 102.9 (H) 09/09/2016   PLT 231 09/09/2016   NEUTROABS 2.7 09/09/2016    Imaging:  No results found.  Medications: I have reviewed the patient's current medications.  Assessment/Plan: 1. Rectal cancer, invasive adenocarcinoma confirmed at colonoscopy 05/22/2015, EUS staging 06/01/2015 revealed a uT3,N1 lesion at 7 cm from the anal verge  Staging CT abdomen/pelvis 05/15/2015 confirmed a mass at the rectosigmoid colon, a suspicious perirectal lymph node, and a 6 mm right liver lesion felt to most likely be a cyst  CT chest 05/30/2015 with indeterminate pulmonary nodules and multiple liver lesions suspicious for metastases  Ultrasound 06/13/2014 with no liver lesions identified  MRI abdomen 06/21/2015 consistent with multiple liver metastases  CT biopsy of liver lesion 06/26/2015. Pathology showed metastatic adenocarcinoma  consistent with colonic primary.  Cycle 1 FOLFOX 07/10/2015  Cycle 2 FOLFOX plus Avastin 07/24/2015 with Neulasta support  Cycle 3 FOLFOX plus Avastin 08/07/2015  Cycle 4 FOLFOX plus Avastin 08/21/2015  cycle 5 FOLFOX plus Avastin 09/04/2015  Restaging CTs 09/15/2015 revealed a decrease in the size of liver lesions, decreased rectal primary, stable/decreased size of tiny lung nodules  Cycle 6 FOLFOX plus Avastin 09/18/2015  Cycle 7 FOLFOX plus Avastin 10/02/2015  Cycle 8 FOLFOX plus Avastin 10/16/2015  Cycle 9 FOLFOX plus Avastin 11/06/2015  Cycle 10 FOLFOX plus Avastin 11/20/2015  CTs 12/07/2015-mild decrease in wall thickening at the rectum, decrease in tiny hepatic metastases, stable lung nodules, no evidence of progressive metastatic disease, new left lower lobe infiltrate  Treatment changed to every three-week Avastin with every other week Xeloda beginning 12/11/2015  Restaging CTs 04/12/2016-interval increase in size of adjacent right upper lobe pulmonary nodules and right lower lobe pulmonary nodule; unchanged 5 mm lesion right hepatic lobe; no additional visualized hepatic metastasis. Grossly unchanged wall thickening of the rectum.  Continuation of Xeloda every other week and every three-week AvastinXeloda placed on hold 05/06/2016 due to hand-foot syndrome  Xeloda resumed 05/28/2016 1500 mg every morning and 1000 mg every afternoon 7 days on/7 days off 2.Multiple colon polyps on the colonoscopy 05/22/2015, tubular villous adenoma and a tubular adenoma were removed 3.Anemia-likely secondary to rectal bleeding.  4.Family history of multiple cancers including breast and prostate cancer 5.Transient right arm numbness 06/20/2015 6. Port-A-Cath placement 07/06/2015 by Dr. Marcello Moores 7. Mild neutropenia secondary to chemotherapy following cycle 1 FOLFOX. Neulasta added with cycle 2. 8. Delayed nausea following chemotherapy-emend added with  cycle 3 FOLFOX 9. Early oxaliplatin  neuropathy 10. 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 Xeloda resumed at a reduced dose. 11. Bilateral hand weakness. Question related to previous oxaliplatin. Referred to neurology 06/17/2016. 12. Hypertension-amlodipine started 07/08/2016    Disposition: Hunter Walker appears stable. Plan to continue Xeloda/Avastin. We referred him for restaging CT scans a few days prior to his next visit in 3 weeks. He will return for a follow-up visit on 09/30/2016. He will contact the office in the interim with any problems.    Hunter Walker ANP/GNP-BC   09/09/2016  1:56 PM

## 2016-09-09 NOTE — Patient Instructions (Signed)

## 2016-09-09 NOTE — Telephone Encounter (Signed)
Gave patient AVS and calender per 4/9 los. [facility] to contact patient with Ct schedule.  

## 2016-09-09 NOTE — Patient Instructions (Signed)
Cancer Center Discharge Instructions for Patients Receiving Chemotherapy  Today you received the following chemotherapy agents:  Avastin  To help prevent nausea and vomiting after your treatment, we encourage you to take your nausea medication as ordered per MD.   If you develop nausea and vomiting that is not controlled by your nausea medication, call the clinic.   BELOW ARE SYMPTOMS THAT SHOULD BE REPORTED IMMEDIATELY:  *FEVER GREATER THAN 100.5 F  *CHILLS WITH OR WITHOUT FEVER  NAUSEA AND VOMITING THAT IS NOT CONTROLLED WITH YOUR NAUSEA MEDICATION  *UNUSUAL SHORTNESS OF BREATH  *UNUSUAL BRUISING OR BLEEDING  TENDERNESS IN MOUTH AND THROAT WITH OR WITHOUT PRESENCE OF ULCERS  *URINARY PROBLEMS  *BOWEL PROBLEMS  UNUSUAL RASH Items with * indicate a potential emergency and should be followed up as soon as possible.  Feel free to call the clinic you have any questions or concerns. The clinic phone number is [phone].  Please show the CHEMO ALERT CARD at check-in to the Emergency Department and triage nurse.   

## 2016-09-12 ENCOUNTER — Ambulatory Visit (INDEPENDENT_AMBULATORY_CARE_PROVIDER_SITE_OTHER): Payer: BLUE CROSS/BLUE SHIELD | Admitting: Neurology

## 2016-09-12 ENCOUNTER — Telehealth: Payer: Self-pay | Admitting: Neurology

## 2016-09-12 DIAGNOSIS — G620 Drug-induced polyneuropathy: Secondary | ICD-10-CM

## 2016-09-12 DIAGNOSIS — T451X5A Adverse effect of antineoplastic and immunosuppressive drugs, initial encounter: Secondary | ICD-10-CM | POA: Diagnosis not present

## 2016-09-12 NOTE — Telephone Encounter (Signed)
Patient was informed of the results of his EMG which shows moderate to severe length dependent, predominant sensory polyneuropathy, there is demyelinating and axon loss features. These findings are present in the upper and lower extremities and explain his hand weakness.  There is no evidence of active denervation or signs of a polyradiculoneuropathy.  His neuropathy is most likely due to oxaliplatin, and should improve slowly with time. There can be some cases coasting phenomenon with chemotherapy-induced neuropathy, where neuropathy symptoms can be worse even following cessation of chemotherapy. Fortunately, he reports slight improvement since his last visit with me.   Although electrodiagnostic he does not have severe motor loss, sensory deficits are moderate to severe.  Therefore,  I would recommend avoiding neurotoxic agents in the future to prevent progression of neuropathy.   Labs looking for secondary causes of neuropathy are still pending.   Donika K. Posey Pronto, DO

## 2016-09-12 NOTE — Procedures (Signed)
[facility]  [place] [address], [address]  [place], [place] [address] Tel: [phone] Fax:  [phone] Test Date:  09/12/2016  Patient: Hunter Walker DOB: [birth year] Physician: Narda Amber, DO  Sex: Male Height: 6' " Ref Phys: Narda Amber, DO  ID#: [id number] Temp: 32.0C Technician:    Patient Complaints: This is a 66 year-old gentleman with rectal cancer s/p chemotherapy with FOLFOX referred for evaluation of hand weakness and stocking-glove distribution of paresthesias.  NCV & EMG Findings: Extensive electrodiagnostic testing of the right upper and lower extremity shows:  1. Right median and sensory responses show prolonged latency and reduced amplitude. Right radial sensory response shows borderline-normal latency and amplitude.  Sensory responses in the right lower extremity are absent. 2. Right ulnar motor response shows conduction velocity slowing across the elbow (A Elbow-B Elbow, 45 m/s), with normal latency and amplitude. Right median and tibial motor responses within normal limits. Right peroneal motor response at the extensor digitorum brevis is reduced (2.3 mV), and normal at the tibialis anterior.  3. Right ulnar F wave is normal. Right tibial H reflex study is absent.  4. In the right lower extremity, chronic motor axon loss changes are seen affecting the right flexor digitorum longus and medial gastrocnemius muscles, without accompanied active denervation. 5. Rapid recruitment pattern with normal appearing motor unit configuration is seen in the distal hand muscles on the right including the first dorsal interosseous, extensor indices proprius, and abductor pollicis brevis. 6. There is no evidence of active denervation involving any of the tested muscles including the cervical and lumbosacral paraspinal muscles.  Impression: The electrophysiologic findings are most consistent with a length-dependent, chronic predominantly sensory polyneuropathy, axonal and  demyelinating in nature, affecting the right upper and lower extremities.  With respect to sensory responses, these findings are moderate-to-severe in degree electrically.   There is also evidence of a superimposed right ulnar neuropathy across the elbow, predominantly demyelinating in type.    ___________________________ Narda Amber, DO    Nerve Conduction Studies Anti Sensory Summary Table   Site NR Peak (ms) Norm Peak (ms) P-T Amp (V) Norm P-T Amp  Right Median Anti Sensory (2nd Digit)  32C  Wrist    3.9 <3.8 6.2 >10  Right Radial Anti Sensory (Base 1st Digit)  32C  Wrist    2.8 <2.8 10.2 >10  Site 2    2.8  10.1   Right Sup Peroneal Anti Sensory (Ant Lat Mall)  32C  12 cm NR  <4.6  >3  Right Sural Anti Sensory (Lat Mall)  32C  Calf NR  <4.6  >3  Right Ulnar Anti Sensory (5th Digit)  32C  Wrist    3.5 <3.2 4.3 >5   Motor Summary Table   Site NR Onset (ms) Norm Onset (ms) O-P Amp (mV) Norm O-P Amp Site1 Site2 Delta-0 (ms) Dist (cm) Vel (m/s) Norm Vel (m/s)  Right Median Motor (Abd Poll Brev)  32C  Wrist    3.5 <4.0 10.8 >5 Elbow Wrist 5.1 30.0 59 >50  Elbow    8.6  10.1         Right Peroneal Motor (Ext Dig Brev)  32C  Ankle    4.3 <6.0 2.3 >2.5 B Fib Ankle 9.4 45.0 48 >40  B Fib    13.7  2.2  Poplt B Fib 2.2 10.0 45 >40  Poplt    15.9  2.0         Right Peroneal TA Motor (Tib Ant)  32C  Fib Head    3.8 <4.5 6.1 >3 Poplit Fib Head 1.6 10.0 62 >40  Poplit    5.4  5.9         Right Tibial Motor (Abd Hall Brev)  32C  Ankle    4.2 <6.0 7.7 >4 Knee Ankle 11.4 46.0 40 >40  Knee    15.6  4.8         Right Ulnar Motor (Abd Dig Minimi)  32C  Wrist    2.4 <3.1 9.7 >7 B Elbow Wrist 4.6 25.5 55 >50  B Elbow    7.0  9.5  A Elbow B Elbow 2.2 10.0 45 >50  A Elbow    9.2  9.3          F Wave Studies   NR F-Lat (ms) Lat Norm (ms) L-R F-Lat (ms)  Right Ulnar (Mrkrs) (Abd Dig Min)  32C     32.67 <33    H Reflex Studies   NR H-Lat (ms) Lat Norm (ms) L-R H-Lat (ms)    Right Tibial (Gastroc)  32C  NR  <35    EMG   Side Muscle Ins Act Fibs Psw Fasc Number Recrt Dur Dur. Amp Amp. Poly Poly. Comment  Right 1stDorInt Nml Nml Nml Nml 1- Rapid Nml Nml Nml Nml Nml Nml N/A  Right Abd Poll Brev Nml Nml Nml Nml 1- Rapid Nml Nml Nml Nml Nml Nml N/A  Right Ext Indicis Nml Nml Nml Nml 1- Rapid Nml Nml Nml Nml Nml Nml N/A  Right PronatorTeres Nml Nml Nml Nml Nml Nml Nml Nml Nml Nml Nml Nml N/A  Right Biceps Nml Nml Nml Nml Nml Nml Nml Nml Nml Nml Nml Nml N/A  Right Triceps Nml Nml Nml Nml Nml Nml Nml Nml Nml Nml Nml Nml N/A  Right Deltoid Nml Nml Nml Nml Nml Nml Nml Nml Nml Nml Nml Nml N/A  Right Cervical Parasp Low Nml Nml Nml Nml NE - - - - - - - N/A  Right FlexPolLong Nml Nml Nml Nml Nml Nml Nml Nml Nml Nml Nml Nml N/A  Right AntTibialis Nml Nml Nml Nml Nml Nml Nml Nml Nml Nml Nml Nml N/A  Right Gastroc Nml Nml Nml Nml 1- Rapid Few 1+ Few 1+ Nml Nml N/A  Right Flex Dig Long Nml Nml Nml Nml 1- Rapid Few 1+ Few 1+ Nml Nml N/A  Right RectFemoris Nml Nml Nml Nml Nml Nml Nml Nml Nml Nml Nml Nml N/A  Right GluteusMed Nml Nml Nml Nml Nml Nml Nml Nml Nml Nml Nml Nml N/A  Right Lumbo Parasp Low Nml Nml Nml Nml NE - - - - - - - N/A      Waveforms:

## 2016-09-20 ENCOUNTER — Other Ambulatory Visit: Payer: Self-pay | Admitting: *Deleted

## 2016-09-20 DIAGNOSIS — C20 Malignant neoplasm of rectum: Secondary | ICD-10-CM

## 2016-09-20 MED ORDER — CAPECITABINE 500 MG PO TABS: ORAL_TABLET | ORAL | 0 refills | Status: DC

## 2016-09-23 ENCOUNTER — Telehealth: Payer: Self-pay | Admitting: Neurology

## 2016-09-23 NOTE — Telephone Encounter (Signed)
Labs rec'd dated 09/09/2016:  Vitamin B12 222, folate 11.8, copper 131, vitamin B1 8, vitamin B6 10.7  With his low-normal vitamin B12 and neuropathy, please inform pt that I recommend starting vitamin B12 injections for supplementation.  Donika K. Posey Pronto, DO

## 2016-09-24 NOTE — Telephone Encounter (Signed)
Patient notified and will start injections next week.

## 2016-09-25 ENCOUNTER — Telehealth: Payer: Self-pay | Admitting: *Deleted

## 2016-09-25 ENCOUNTER — Other Ambulatory Visit: Payer: Self-pay | Admitting: *Deleted

## 2016-09-25 NOTE — Telephone Encounter (Signed)
Message received from [place] requesting patient's current height and weight.  Call placed back to [facility] to give them patient's current ht and wt.

## 2016-09-27 ENCOUNTER — Ambulatory Visit (HOSPITAL_COMMUNITY)
Admission: RE | Admit: 2016-09-27 | Discharge: 2016-09-27 | Disposition: A | Discharge status: Home / Self Care | Payer: BLUE CROSS/BLUE SHIELD | Source: Ambulatory Visit | Attending: Nurse Practitioner | Admitting: Nurse Practitioner

## 2016-09-27 DIAGNOSIS — K5641 Fecal impaction: Secondary | ICD-10-CM | POA: Diagnosis not present

## 2016-09-27 DIAGNOSIS — M5136 Other intervertebral disc degeneration, lumbar region: Secondary | ICD-10-CM | POA: Insufficient documentation

## 2016-09-27 DIAGNOSIS — K838 Other specified diseases of biliary tract: Secondary | ICD-10-CM | POA: Diagnosis not present

## 2016-09-27 DIAGNOSIS — C787 Secondary malignant neoplasm of liver and intrahepatic bile duct: Secondary | ICD-10-CM | POA: Diagnosis not present

## 2016-09-27 DIAGNOSIS — C20 Malignant neoplasm of rectum: Secondary | ICD-10-CM

## 2016-09-27 DIAGNOSIS — N400 Enlarged prostate without lower urinary tract symptoms: Secondary | ICD-10-CM | POA: Insufficient documentation

## 2016-09-27 DIAGNOSIS — R918 Other nonspecific abnormal finding of lung field: Secondary | ICD-10-CM | POA: Diagnosis not present

## 2016-09-27 DIAGNOSIS — I700 Atherosclerosis of aorta: Secondary | ICD-10-CM | POA: Insufficient documentation

## 2016-09-27 DIAGNOSIS — C19 Malignant neoplasm of rectosigmoid junction: Secondary | ICD-10-CM | POA: Insufficient documentation

## 2016-09-27 DIAGNOSIS — M47816 Spondylosis without myelopathy or radiculopathy, lumbar region: Secondary | ICD-10-CM | POA: Diagnosis not present

## 2016-09-27 MED ORDER — IOPAMIDOL (ISOVUE-300) INJECTION 61%
100.0000 mL | Freq: Once | INTRAVENOUS | Status: AC | PRN
  Administered 2016-09-27: 100 mL via INTRAVENOUS

## 2016-09-27 MED ORDER — IOPAMIDOL (ISOVUE-300) INJECTION 61%
INTRAVENOUS | Status: DC
Start: 2016-09-27 — End: 2016-09-28
  Filled 2016-09-27: qty 100

## 2016-09-29 ENCOUNTER — Other Ambulatory Visit: Payer: Self-pay | Admitting: Oncology

## 2016-09-30 ENCOUNTER — Ambulatory Visit: Payer: BLUE CROSS/BLUE SHIELD

## 2016-09-30 ENCOUNTER — Other Ambulatory Visit (HOSPITAL_BASED_OUTPATIENT_CLINIC_OR_DEPARTMENT_OTHER): Payer: BLUE CROSS/BLUE SHIELD

## 2016-09-30 ENCOUNTER — Ambulatory Visit (HOSPITAL_BASED_OUTPATIENT_CLINIC_OR_DEPARTMENT_OTHER): Payer: BLUE CROSS/BLUE SHIELD | Admitting: Oncology

## 2016-09-30 VITALS — BP 130/80 | HR 67 | Temp 98.1°F | Resp 17 | Ht 72.0 in | Wt 182.3 lb

## 2016-09-30 DIAGNOSIS — C787 Secondary malignant neoplasm of liver and intrahepatic bile duct: Secondary | ICD-10-CM | POA: Diagnosis not present

## 2016-09-30 DIAGNOSIS — G620 Drug-induced polyneuropathy: Secondary | ICD-10-CM

## 2016-09-30 DIAGNOSIS — C20 Malignant neoplasm of rectum: Secondary | ICD-10-CM

## 2016-09-30 DIAGNOSIS — C7800 Secondary malignant neoplasm of unspecified lung: Secondary | ICD-10-CM | POA: Diagnosis not present

## 2016-09-30 DIAGNOSIS — Z95828 Presence of other vascular implants and grafts: Secondary | ICD-10-CM

## 2016-09-30 DIAGNOSIS — Z7189 Other specified counseling: Secondary | ICD-10-CM | POA: Insufficient documentation

## 2016-09-30 LAB — CBC WITH DIFFERENTIAL/PLATELET
BASO%: 0.6 % (ref 0.0–2.0)
Basophils Absolute: 0 10*3/uL (ref 0.0–0.1)
EOS%: 1.6 % (ref 0.0–7.0)
Eosinophils Absolute: 0.1 10*3/uL (ref 0.0–0.5)
HCT: 43.2 % (ref 38.4–49.9)
HEMOGLOBIN: 14.4 g/dL (ref 13.0–17.1)
LYMPH#: 2.3 10*3/uL (ref 0.9–3.3)
LYMPH%: 41 % (ref 14.0–49.0)
MCH: 34.6 pg — ABNORMAL HIGH (ref 27.2–33.4)
MCHC: 33.3 g/dL (ref 32.0–36.0)
MCV: 104.1 fL — ABNORMAL HIGH (ref 79.3–98.0)
MONO#: 0.4 10*3/uL (ref 0.1–0.9)
MONO%: 7.7 % (ref 0.0–14.0)
NEUT%: 49.1 % (ref 39.0–75.0)
NEUTROS ABS: 2.7 10*3/uL (ref 1.5–6.5)
PLATELETS: 217 10*3/uL (ref 140–400)
RBC: 4.15 10*6/uL — ABNORMAL LOW (ref 4.20–5.82)
RDW: 17.9 % — AB (ref 11.0–14.6)
WBC: 5.6 10*3/uL (ref 4.0–10.3)

## 2016-09-30 LAB — UA PROTEIN, DIPSTICK - CHCC: Protein, ur: NEGATIVE mg/dL

## 2016-09-30 LAB — COMPREHENSIVE METABOLIC PANEL
ALBUMIN: 3.9 g/dL (ref 3.5–5.0)
ALK PHOS: 68 U/L (ref 40–150)
ALT: 11 U/L (ref 0–55)
ANION GAP: 12 meq/L — AB (ref 3–11)
AST: 19 U/L (ref 5–34)
BILIRUBIN TOTAL: 0.48 mg/dL (ref 0.20–1.20)
BUN: 15 mg/dL (ref 7.0–26.0)
CO2: 23 meq/L (ref 22–29)
CREATININE: 1.3 mg/dL (ref 0.7–1.3)
Calcium: 9.2 mg/dL (ref 8.4–10.4)
Chloride: 106 mEq/L (ref 98–109)
EGFR: 66 mL/min/{1.73_m2} — ABNORMAL LOW (ref >90)
GLUCOSE: 112 mg/dL (ref 70–140)
Potassium: 4 mEq/L (ref 3.5–5.1)
Sodium: 141 mEq/L (ref 136–145)
TOTAL PROTEIN: 8.3 g/dL (ref 6.4–8.3)

## 2016-09-30 LAB — CEA (IN HOUSE-CHCC): CEA (CHCC-In House): 3.24 ng/mL (ref 0.00–5.00)

## 2016-09-30 MED ORDER — SODIUM CHLORIDE 0.9% FLUSH
10.0000 mL | INTRAVENOUS | Status: DC | PRN
  Administered 2016-09-30: 10 mL via INTRAVENOUS
  Filled 2016-09-30: qty 10

## 2016-09-30 MED ORDER — HEPARIN SOD (PORK) LOCK FLUSH 100 UNIT/ML IV SOLN
500.0000 [IU] | Freq: Once | INTRAVENOUS | Status: AC
  Administered 2016-09-30: 500 [IU] via INTRAVENOUS
  Filled 2016-09-30: qty 5

## 2016-09-30 MED ORDER — SODIUM CHLORIDE 0.9 % IJ SOLN
10.0000 mL | INTRAMUSCULAR | Status: DC | PRN
  Administered 2016-09-30: 10 mL via INTRAVENOUS
  Filled 2016-09-30: qty 10

## 2016-09-30 NOTE — Patient Instructions (Signed)
Panitumumab Solution for Injection What is this medicine? PANITUMUMAB (pan i TOOM ue mab) is a monoclonal antibody. It is used to treat colorectal cancer. This medicine may be used for other purposes; ask your health care provider or pharmacist if you have questions. COMMON BRAND NAME(S): Vectibix What should I tell my health care provider before I take this medicine? They need to know if you have any of these conditions: -eye disease, vision problems -low levels of calcium, magnesium, or potassium in the blood -lung or breathing disease, like asthma -skin conditions or sensitivity -an unusual or allergic reaction to panitumumab, other medicines, foods, dyes, or preservatives -pregnant or trying to get pregnant -breast-feeding How should I use this medicine? This drug is given as an infusion into a vein. It is administered in a hospital or clinic by a specially trained health care professional. Talk to your pediatrician regarding the use of this medicine in children. Special care may be needed. Overdosage: If you think you have taken too much of this medicine contact a poison control center or emergency room at once. NOTE: This medicine is only for you. Do not share this medicine with others. What if I miss a dose? It is important not to miss your dose. Call your doctor or health care professional if you are unable to keep an appointment. What may interact with this medicine? Do not take this medicine with any of the following medications: -bevacizumab This list may not describe all possible interactions. Give your health care provider a list of all the medicines, herbs, non-prescription drugs, or dietary supplements you use. Also tell them if you smoke, drink alcohol, or use illegal drugs. Some items may interact with your medicine. What should I watch for while using this medicine? Visit your doctor for checks on your progress. This drug may make you feel generally unwell. This is not  uncommon, as chemotherapy can affect healthy cells as well as cancer cells. Report any side effects. Continue your course of treatment even though you feel ill unless your doctor tells you to stop. This medicine can make you more sensitive to the sun. Keep out of the sun while receiving this medicine and for 2 months after the last dose. If you cannot avoid being in the sun, wear protective clothing and use sunscreen. Do not use sun lamps or tanning beds/booths. In some cases, you may be given additional medicines to help with side effects. Follow all directions for their use. Call your doctor or health care professional for advice if you get a fever, chills or sore throat, or other symptoms of a cold or flu. Do not treat yourself. This drug decreases your body's ability to fight infections. Try to avoid being around people who are sick. Avoid taking products that contain aspirin, acetaminophen, ibuprofen, naproxen, or ketoprofen unless instructed by your doctor. These medicines may hide a fever. Do not become pregnant while taking this medicine and for 2 months after the last dose. Women should inform their doctor if they wish to become pregnant or think they might be pregnant. There is a potential for serious side effects to an unborn child. Talk to your health care professional or pharmacist for more information. Do not breast-feed an infant while taking this medicine or for 2 months after the last dose. What side effects may I notice from receiving this medicine? Side effects that you should report to your doctor or health care professional as soon as possible: -allergic reactions like skin rash, itching   or hives, swelling of the face, lips, or tongue -breathing problems -changes in vision -eye pain -fast, irregular heartbeat -fever, chills -mouth sores -red spots on the skin -redness, blistering, peeling or loosening of the skin, including inside the mouth -signs and symptoms of kidney injury  like trouble passing urine or change in the amount of urine -signs and symptoms of low blood pressure like dizziness; feeling faint or lightheaded, falls; unusually weak or tired -signs of low calcium like fast heartbeat, muscle cramps or muscle pain; pain, tingling, numbness in the hands or feet; seizures -signs and symptoms of low magnesium like muscle cramps, pain, or weakness; tremors; seizures; or fast, irregular heartbeat -signs and symptoms of low potassium like muscle cramps or muscle pain; chest pain; dizziness; feeling faint or lightheaded, falls; palpitations; breathing problems; or fast, irregular heartbeat -swelling of the ankles, feet, hands Side effects that usually do not require medical attention (report to your doctor or health care professional if they continue or are bothersome): -changes in skin like acne, cracks, skin dryness -diarrhea -eyelash growth -headache -mouth sores -nail changes -nausea, vomiting This list may not describe all possible side effects. Call your doctor for medical advice about side effects. You may report side effects to FDA at 1-800-FDA-1088. Where should I keep my medicine? This drug is given in a hospital or clinic and will not be stored at home. NOTE: This sheet is a summary. It may not cover all possible information. If you have questions about this medicine, talk to your doctor, pharmacist, or health care provider.  2018 Elsevier/Gold Standard (2015-12-08 16:45:04) Irinotecan injection What is this medicine? IRINOTECAN (ir in oh TEE kan ) is a chemotherapy drug. It is used to treat colon and rectal cancer. This medicine may be used for other purposes; ask your health care provider or pharmacist if you have questions. COMMON BRAND NAME(S): Camptosar What should I tell my health care provider before I take this medicine? They need to know if you have any of these conditions: -blood disorders -dehydration -diarrhea -infection (especially a  virus infection such as chickenpox, cold sores, or herpes) -liver disease -low blood counts, like low white cell, platelet, or red cell counts -recent or ongoing radiation therapy -an unusual or allergic reaction to irinotecan, sorbitol, other chemotherapy, other medicines, foods, dyes, or preservatives -pregnant or trying to get pregnant -breast-feeding How should I use this medicine? This drug is given as an infusion into a vein. It is administered in a hospital or clinic by a specially trained health care professional. Talk to your pediatrician regarding the use of this medicine in children. Special care may be needed. Overdosage: If you think you have taken too much of this medicine contact a poison control center or emergency room at once. NOTE: This medicine is only for you. Do not share this medicine with others. What if I miss a dose? It is important not to miss your dose. Call your doctor or health care professional if you are unable to keep an appointment. What may interact with this medicine? Do not take this medicine with any of the following medications: -atazanavir -certain medicines for fungal infections like itraconazole and ketoconazole -St. John's Wort This medicine may also interact with the following medications: -dexamethasone -diuretics -laxatives -medicines for seizures like carbamazepine, mephobarbital, phenobarbital, phenytoin, primidone -medicines to increase blood counts like filgrastim, pegfilgrastim, sargramostim -prochlorperazine -vaccines This list may not describe all possible interactions. Give your health care provider a list of all the medicines, herbs,  non-prescription drugs, or dietary supplements you use. Also tell them if you smoke, drink alcohol, or use illegal drugs. Some items may interact with your medicine. What should I watch for while using this medicine? Your condition will be monitored carefully while you are receiving this medicine. You  will need important blood work done while you are taking this medicine. This drug may make you feel generally unwell. This is not uncommon, as chemotherapy can affect healthy cells as well as cancer cells. Report any side effects. Continue your course of treatment even though you feel ill unless your doctor tells you to stop. In some cases, you may be given additional medicines to help with side effects. Follow all directions for their use. You may get drowsy or dizzy. Do not drive, use machinery, or do anything that needs mental alertness until you know how this medicine affects you. Do not stand or sit up quickly, especially if you are an older patient. This reduces the risk of dizzy or fainting spells. Call your doctor or health care professional for advice if you get a fever, chills or sore throat, or other symptoms of a cold or flu. Do not treat yourself. This drug decreases your body's ability to fight infections. Try to avoid being around people who are sick. This medicine may increase your risk to bruise or bleed. Call your doctor or health care professional if you notice any unusual bleeding. Be careful brushing and flossing your teeth or using a toothpick because you may get an infection or bleed more easily. If you have any dental work done, tell your dentist you are receiving this medicine. Avoid taking products that contain aspirin, acetaminophen, ibuprofen, naproxen, or ketoprofen unless instructed by your doctor. These medicines may hide a fever. Do not become pregnant while taking this medicine. Women should inform their doctor if they wish to become pregnant or think they might be pregnant. There is a potential for serious side effects to an unborn child. Talk to your health care professional or pharmacist for more information. Do not breast-feed an infant while taking this medicine. What side effects may I notice from receiving this medicine? Side effects that you should report to your  doctor or health care professional as soon as possible: -allergic reactions like skin rash, itching or hives, swelling of the face, lips, or tongue -low blood counts - this medicine may decrease the number of white blood cells, red blood cells and platelets. You may be at increased risk for infections and bleeding. -signs of infection - fever or chills, cough, sore throat, pain or difficulty passing urine -signs of decreased platelets or bleeding - bruising, pinpoint red spots on the skin, black, tarry stools, blood in the urine -signs of decreased red blood cells - unusually weak or tired, fainting spells, lightheadedness -breathing problems -chest pain -diarrhea -feeling faint or lightheaded, falls -flushing, runny nose, sweating during infusion -mouth sores or pain -pain, swelling, redness or irritation where injected -pain, swelling, warmth in the leg -pain, tingling, numbness in the hands or feet -problems with balance, talking, walking -stomach cramps, pain -trouble passing urine or change in the amount of urine -vomiting as to be unable to hold down drinks or food -yellowing of the eyes or skin Side effects that usually do not require medical attention (report to your doctor or health care professional if they continue or are bothersome): -constipation -hair loss -headache -loss of appetite -nausea, vomiting -stomach upset This list may not describe all possible side  effects. Call your doctor for medical advice about side effects. You may report side effects to FDA at 1-800-FDA-1088. Where should I keep my medicine? This drug is given in a hospital or clinic and will not be stored at home. NOTE: This sheet is a summary. It may not cover all possible information. If you have questions about this medicine, talk to your doctor, pharmacist, or health care provider.  2018 Elsevier/Gold Standard (2012-11-16 16:29:32)

## 2016-09-30 NOTE — Progress Notes (Signed)
[place] OFFICE PROGRESS NOTE   Diagnosis: Colon cancer  INTERVAL HISTORY:   Mr. Butrick returns as scheduled. He continues Xeloda and Avastin. He reports improvement in the neuropathy symptoms since beginning gabapentin. No mouth sores or hand/foot pain. No new complaint.  Objective:  Vital signs in last 24 hours:  Blood pressure 130/80, pulse 67, temperature 98.1 F (36.7 C), temperature source Oral, resp. rate 17, height 6' (1.829 m), weight 182 lb 4.8 oz (82.7 kg), SpO2 100 %.    HEENT: No thrush or ulcers Resp: Lungs clear bilaterally Cardio: Regular rate and rhythm GI: No hepatosplenomegaly, nontender Vascular: No leg edema  Skin: Hyperpigmentation of the hands, dryness of the lower legs and feet. No erythema or skin breakdown.   Portacath/PICC-without erythema  Lab Results:  Lab Results  Component Value Date   WBC 5.6 09/30/2016   HGB 14.4 09/30/2016   HCT 43.2 09/30/2016   MCV 104.1 (H) 09/30/2016   PLT 217 09/30/2016   NEUTROABS 2.7 09/30/2016    CMP     Component Value Date/Time   NA 141 09/30/2016 0953   K 4.0 09/30/2016 0953   CL 109 07/30/2015 1844   CO2 23 09/30/2016 0953   GLUCOSE 112 09/30/2016 0953   BUN 15.0 09/30/2016 0953   CREATININE 1.3 09/30/2016 0953   CALCIUM 9.2 09/30/2016 0953   PROT 8.3 09/30/2016 0953   ALBUMIN 3.9 09/30/2016 0953   AST 19 09/30/2016 0953   ALT 11 09/30/2016 0953   ALKPHOS 68 09/30/2016 0953   BILITOT 0.48 09/30/2016 0953   GFRNONAA >60 07/30/2015 1844   GFRAA >60 07/30/2015 1844  CEA on 09/30/2016-3.24  Imaging:  Ct Chest W Contrast  Result Date: 09/27/2016 CLINICAL DATA:  Rectal can't diagnosed in December 2016 with ongoing chemotherapy. EXAM: CT CHEST, ABDOMEN, AND PELVIS WITH CONTRAST TECHNIQUE: Multidetector CT imaging of the chest, abdomen and pelvis was performed following the standard protocol during bolus administration of intravenous contrast. CONTRAST:  182m ISOVUE-300  IOPAMIDOL (ISOVUE-300) INJECTION 61% COMPARISON:  Multiple exams, including 04/12/2016 FINDINGS: CT CHEST FINDINGS Cardiovascular: Atherosclerotic calcification of the aortic arch. Mediastinum/Nodes: Unremarkable Lungs/Pleura: Enlarging pulmonary nodules, mostly on the right side, compatible with metastatic lesions. A right upper lobe nodule on image 44/6 measures 1.1 by 1.0 cm, formerly 0.8 by 0.6 cm. There is new nodularity in the left upper lobe on image 43/6 probably representing a new small metastatic pulmonary nodule. Multiple other nodules are present and are enlarging. Musculoskeletal: Lower cervical spondylosis. CT ABDOMEN PELVIS FINDINGS Hepatobiliary: Multiple new hypodense hepatic lesions compatible with metastatic disease. A 1.9 by 1.4 cm lesion in segment 2 of the liver on image 54/2 is new. There is some adjacent nodularity in the lateral segment left hepatic lobe and several suspected new hypodense lesions in the right hepatic lobe including a 1.4 by 0.9 cm hypodense lesion on image 62/2. The previous 7 mm hypodense lesion in the right hepatic lobe on image 61 of series 2 is present and could be an incidental benign lesion but is technically nonspecific. The common bile duct measures 9 mm in diameter, mildly dilated. A cause for this dilatation is not seen. Pancreas: Borderline prominence of the dorsal pancreatic duct, but similar to prior. Spleen: Unremarkable Adrenals/Urinary Tract: Unremarkable Stomach/Bowel: Prominence of stool throughout the colon. Wall thickening in the ascending colon is attributable to contraction. Subjectively the high rectal mass appears similar to the prior exam, for example on image 85/5. There are small perirectal foci of tumor  including a presumed perirectal lymph node measuring 1 cm in short axis on image 111/2, previously 0.9 cm. Mildly redundant sigmoid colon extends up into the right upper quadrant. Vascular/Lymphatic: Aortoiliac atherosclerotic vascular disease. No  pathologic adenopathy identified aside from the perirectal nodules. Reproductive: Enlarged prostate gland, 5.0 by 5.9 by 5.5 cm (volume = 85 cm^3). Faint peripheral zone enhancement in the left apex image 122/2. Other: No supplemental non-categorized findings. Musculoskeletal: Lumbar spondylosis and degenerative disc disease with potential impingement at the L3-4, L4-5, and L5-S1 levels. IMPRESSION: 1. Unfortunately there has been progression of malignancy, with enlarging bilateral pulmonary nodules as well as new nodularity in the left upper lobe, and new hepatic metastatic lesions. The morphology of the primary rectal tumor is similar to the prior exam, with small likely malignant perirectal lymph nodes. 2. Mildly dilated common bile duct and borderline prominent dorsal pancreatic duct. 3.  Prominent stool throughout the colon favors constipation. 4.  Aortic Atherosclerosis (ICD10-I70.0). 5. Enlarged prostate gland. Faint peripheral zone enhancement in the left prostate apex, uncertain significance. 6. Lumbar spondylosis and degenerative disc disease causing lower lumbar impingement. Electronically Signed   By: Van Clines M.D.   On: 09/27/2016 15:51   Ct Abdomen Pelvis W Contrast  Result Date: 09/27/2016 CLINICAL DATA:  Rectal can't diagnosed in December 2016 with ongoing chemotherapy. EXAM: CT CHEST, ABDOMEN, AND PELVIS WITH CONTRAST TECHNIQUE: Multidetector CT imaging of the chest, abdomen and pelvis was performed following the standard protocol during bolus administration of intravenous contrast. CONTRAST:  131m ISOVUE-300 IOPAMIDOL (ISOVUE-300) INJECTION 61% COMPARISON:  Multiple exams, including 04/12/2016 FINDINGS: CT CHEST FINDINGS Cardiovascular: Atherosclerotic calcification of the aortic arch. Mediastinum/Nodes: Unremarkable Lungs/Pleura: Enlarging pulmonary nodules, mostly on the right side, compatible with metastatic lesions. A right upper lobe nodule on image 44/6 measures 1.1 by 1.0 cm,  formerly 0.8 by 0.6 cm. There is new nodularity in the left upper lobe on image 43/6 probably representing a new small metastatic pulmonary nodule. Multiple other nodules are present and are enlarging. Musculoskeletal: Lower cervical spondylosis. CT ABDOMEN PELVIS FINDINGS Hepatobiliary: Multiple new hypodense hepatic lesions compatible with metastatic disease. A 1.9 by 1.4 cm lesion in segment 2 of the liver on image 54/2 is new. There is some adjacent nodularity in the lateral segment left hepatic lobe and several suspected new hypodense lesions in the right hepatic lobe including a 1.4 by 0.9 cm hypodense lesion on image 62/2. The previous 7 mm hypodense lesion in the right hepatic lobe on image 61 of series 2 is present and could be an incidental benign lesion but is technically nonspecific. The common bile duct measures 9 mm in diameter, mildly dilated. A cause for this dilatation is not seen. Pancreas: Borderline prominence of the dorsal pancreatic duct, but similar to prior. Spleen: Unremarkable Adrenals/Urinary Tract: Unremarkable Stomach/Bowel: Prominence of stool throughout the colon. Wall thickening in the ascending colon is attributable to contraction. Subjectively the high rectal mass appears similar to the prior exam, for example on image 85/5. There are small perirectal foci of tumor including a presumed perirectal lymph node measuring 1 cm in short axis on image 111/2, previously 0.9 cm. Mildly redundant sigmoid colon extends up into the right upper quadrant. Vascular/Lymphatic: Aortoiliac atherosclerotic vascular disease. No pathologic adenopathy identified aside from the perirectal nodules. Reproductive: Enlarged prostate gland, 5.0 by 5.9 by 5.5 cm (volume = 85 cm^3). Faint peripheral zone enhancement in the left apex image 122/2. Other: No supplemental non-categorized findings. Musculoskeletal: Lumbar spondylosis and degenerative disc disease with  potential impingement at the L3-4, L4-5, and  L5-S1 levels. IMPRESSION: 1. Unfortunately there has been progression of malignancy, with enlarging bilateral pulmonary nodules as well as new nodularity in the left upper lobe, and new hepatic metastatic lesions. The morphology of the primary rectal tumor is similar to the prior exam, with small likely malignant perirectal lymph nodes. 2. Mildly dilated common bile duct and borderline prominent dorsal pancreatic duct. 3.  Prominent stool throughout the colon favors constipation. 4.  Aortic Atherosclerosis (ICD10-I70.0). 5. Enlarged prostate gland. Faint peripheral zone enhancement in the left prostate apex, uncertain significance. 6. Lumbar spondylosis and degenerative disc disease causing lower lumbar impingement. Electronically Signed   By: Van Clines M.D.   On: 09/27/2016 15:51    Medications: I have reviewed the patient's current medications.  Assessment/Plan: 1. Rectal cancer, invasive adenocarcinoma confirmed at colonoscopy 05/22/2015, EUS staging 06/01/2015 revealed a uT3,N1 lesion at 7 cm from the anal verge  Staging CT abdomen/pelvis 05/15/2015 confirmed a mass at the rectosigmoid colon, a suspicious perirectal lymph node, and a 6 mm right liver lesion felt to most likely be a cyst  CT chest 05/30/2015 with indeterminate pulmonary nodules and multiple liver lesions suspicious for metastases  Ultrasound 06/13/2014 with no liver lesions identified  MRI abdomen 06/21/2015 consistent with multiple liver metastases  CT biopsy of liver lesion 06/26/2015. Pathology showed metastatic adenocarcinoma consistent with colonic primary. MSI-stable, low mutation burden, no  RAS or BRAF mutation  Cycle 1 FOLFOX 07/10/2015  Cycle 2 FOLFOX plus Avastin 07/24/2015 with Neulasta support  Cycle 3 FOLFOX plus Avastin 08/07/2015  Cycle 4 FOLFOX plus Avastin 08/21/2015  cycle 5 FOLFOX plus Avastin 09/04/2015  Restaging CTs 09/15/2015 revealed a decrease in the size of liver lesions, decreased  rectal primary, stable/decreased size of tiny lung nodules  Cycle 6 FOLFOX plus Avastin 09/18/2015  Cycle 7 FOLFOX plus Avastin 10/02/2015  Cycle 8 FOLFOX plus Avastin 10/16/2015  Cycle 9 FOLFOX plus Avastin 11/06/2015  Cycle 10 FOLFOX plus Avastin 11/20/2015  CTs 12/07/2015-mild decrease in wall thickening at the rectum, decrease in tiny hepatic metastases, stable lung nodules, no evidence of progressive metastatic disease, new left lower lobe infiltrate  Treatment changed to every three-week Avastin with every other week Xeloda beginning 12/11/2015  Restaging CTs 04/12/2016-interval increase in size of adjacent right upper lobe pulmonary nodules and right lower lobe pulmonary nodule; unchanged 5 mm lesion right hepatic lobe; no additional visualized hepatic metastasis. Grossly unchanged wall thickening of the rectum.  Continuation of Xeloda every other week and every three-week AvastinXeloda placed on hold 05/06/2016 due to hand-foot syndrome  Xeloda resumed 05/28/2016 1500 mg every morning and 1000 mg every afternoon 7 days on/7 days off  CT 09/27/2016-enlargement of lung nodules and new liver lesions, stable rectal mass 2.Multiple colon polyps on the colonoscopy 05/22/2015, tubular villous adenoma and a tubular adenoma were removed 3.Anemia-likely secondary to rectal bleeding.  4.Family history of multiple cancers including breast and prostate cancer 5.Transient right arm numbness 06/20/2015 6. Port-A-Cath placement 07/06/2015 by Dr. Marcello Moores 7. Mild neutropenia secondary to chemotherapy following cycle 1 FOLFOX. Neulasta added with cycle 2. 8. Delayed nausea following chemotherapy-emend added with cycle 3 FOLFOX 9. Early oxaliplatin neuropathy 10. 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 Xeloda resumed at a reduced dose. 11. Bilateral hand weakness. Question related to previous oxaliplatin. Referred to neurology 06/17/2016. 12.  Hypertension-amlodipine started 07/08/2016   Disposition:  Hunter Walker appears unchanged. The restaging CT reveals progressive metastatic disease involving the lungs and liver.  I reviewed the CT images.  I discussed treatment options with Mr. Cripps. We discussed a treatment break since he appears asymptomatic from the rectal cancer. We also discussed salvage therapy with FOLFIRI and panitumumab. The tumor is RAS wild-type. I recommend treatment with FOLFIRI/panitumumab. We reviewed the potential toxicities associated with the FOLFIRI regimen including the acute/delayed diarrhea and alopecia associated with irinotecan. We discussed the rash, allergic reaction, and diarrhea associated with panitumumab. He agrees to proceed.  The plan is to begin salvage therapy with FOLFIRI/panitumumab within the next 2 weeks.  40 minutes were spent with the patient today. The majority of the time was used for counseling and coordination of care. A new chemotherapy plan was entered.  Betsy Coder, MD  09/30/2016  1:42 PM

## 2016-09-30 NOTE — Patient Instructions (Signed)
Implanted Port Home Guide An implanted port is a type of central line that is placed under the skin. Central lines are used to provide IV access when treatment or nutrition needs to be given through a person's veins. Implanted ports are used for long-term IV access. An implanted port may be placed because:  You need IV medicine that would be irritating to the small veins in your hands or arms.  You need long-term IV medicines, such as antibiotics.  You need IV nutrition for a long period.  You need frequent blood draws for lab tests.  You need dialysis.  Implanted ports are usually placed in the chest area, but they can also be placed in the upper arm, the abdomen, or the leg. An implanted port has two main parts:  Reservoir. The reservoir is round and will appear as a small, raised area under your skin. The reservoir is the part where a needle is inserted to give medicines or draw blood.  Catheter. The catheter is a thin, flexible tube that extends from the reservoir. The catheter is placed into a large vein. Medicine that is inserted into the reservoir goes into the catheter and then into the vein.  How will I care for my incision site? Do not get the incision site wet. Bathe or shower as directed by your health care provider. How is my port accessed? Special steps must be taken to access the port:  Before the port is accessed, a numbing cream can be placed on the skin. This helps numb the skin over the port site.  Your health care provider uses a sterile technique to access the port. ? Your health care provider must put on a mask and sterile gloves. ? The skin over your port is cleaned carefully with an antiseptic and allowed to dry. ? The port is gently pinched between sterile gloves, and a needle is inserted into the port.  Only "non-coring" port needles should be used to access the port. Once the port is accessed, a blood return should be checked. This helps ensure that the port  is in the vein and is not clogged.  If your port needs to remain accessed for a constant infusion, a clear (transparent) bandage will be placed over the needle site. The bandage and needle will need to be changed every week, or as directed by your health care provider.  Keep the bandage covering the needle clean and dry. Do not get it wet. Follow your health care provider's instructions on how to take a shower or bath while the port is accessed.  If your port does not need to stay accessed, no bandage is needed over the port.  What is flushing? Flushing helps keep the port from getting clogged. Follow your health care provider's instructions on how and when to flush the port. Ports are usually flushed with saline solution or a medicine called heparin. The need for flushing will depend on how the port is used.  If the port is used for intermittent medicines or blood draws, the port will need to be flushed: ? After medicines have been given. ? After blood has been drawn. ? As part of routine maintenance.  If a constant infusion is running, the port may not need to be flushed.  How long will my port stay implanted? The port can stay in for as long as your health care provider thinks it is needed. When it is time for the port to come out, surgery will be   done to remove it. The procedure is similar to the one performed when the port was put in. When should I seek immediate medical care? When you have an implanted port, you should seek immediate medical care if:  You notice a bad smell coming from the incision site.  You have swelling, redness, or drainage at the incision site.  You have more swelling or pain at the port site or the surrounding area.  You have a fever that is not controlled with medicine.  This information is not intended to replace advice given to you by your health care provider. Make sure you discuss any questions you have with your health care provider. Document  Released: 05/20/2005 Document Revised: 10/26/2015 Document Reviewed: 01/25/2013 Elsevier Interactive Patient Education  2017 [employer]  

## 2016-09-30 NOTE — Progress Notes (Signed)
START OFF PATHWAY REGIMEN - Colorectal   [id number]:FOLFIRI + Panitumumab q14d (**2 cycles per order sheet**):   A cycle is every 14 days:     Panitumumab      Irinotecan      Leucovorin      5-Fluorouracil      5-Fluorouracil   **Always confirm dose/schedule in your pharmacy ordering system**    Patient Characteristics: Metastatic Colorectal, Second Line, KRAS/NRAS Wild-Type, BRAF Wild-Type/Unknown, No Prior Anti-EGFR Therapy Current evidence of distant metastases? Yes AJCC T Category: Staged < 8th Ed. AJCC N Category: Staged < 8th Ed. AJCC M Category: Staged < 8th Ed. AJCC 8 Stage Grouping: Staged < 8th Ed. BRAF Mutation Status: Wild Type (no mutation) KRAS/NRAS Mutation Status: Wild Type (no mutation) Line of therapy: Second Line Would you be surprised if this patient died  in the next year? I would be surprised if this patient died in the next year  Intent of Therapy: Non-Curative / Palliative Intent, Discussed with Patient

## 2016-10-04 ENCOUNTER — Telehealth: Payer: Self-pay | Admitting: Oncology

## 2016-10-04 ENCOUNTER — Other Ambulatory Visit: Payer: Self-pay | Admitting: *Deleted

## 2016-10-04 NOTE — Telephone Encounter (Signed)
Message sent to Infusion manager to be added for 10/07/16. Appointments pending. This is a new drug for patient. Dr Benay Spice is aware.

## 2016-10-07 ENCOUNTER — Telehealth: Payer: Self-pay | Admitting: Oncology

## 2016-10-07 NOTE — Telephone Encounter (Signed)
Treatment was approved by Ubaldo Glassing to be added on 10/08/16. Appointments scheduled and confirmed with patient.  Lab, flush and follow up with D Sherrill was scheduled for 10/22/16. Message sent to [facility] dale to add for 10/22/16.

## 2016-10-08 ENCOUNTER — Ambulatory Visit (HOSPITAL_BASED_OUTPATIENT_CLINIC_OR_DEPARTMENT_OTHER): Payer: BLUE CROSS/BLUE SHIELD

## 2016-10-08 ENCOUNTER — Other Ambulatory Visit: Payer: BLUE CROSS/BLUE SHIELD

## 2016-10-08 ENCOUNTER — Ambulatory Visit: Payer: BLUE CROSS/BLUE SHIELD

## 2016-10-08 ENCOUNTER — Other Ambulatory Visit: Payer: Self-pay | Admitting: Oncology

## 2016-10-08 DIAGNOSIS — Z5111 Encounter for antineoplastic chemotherapy: Secondary | ICD-10-CM | POA: Diagnosis not present

## 2016-10-08 DIAGNOSIS — Z5112 Encounter for antineoplastic immunotherapy: Secondary | ICD-10-CM

## 2016-10-08 DIAGNOSIS — Z95828 Presence of other vascular implants and grafts: Secondary | ICD-10-CM

## 2016-10-08 DIAGNOSIS — C20 Malignant neoplasm of rectum: Secondary | ICD-10-CM

## 2016-10-08 LAB — CBC WITH DIFFERENTIAL/PLATELET
BASO%: 0.8 % (ref 0.0–2.0)
BASOS ABS: 0 10*3/uL (ref 0.0–0.1)
EOS ABS: 0.1 10*3/uL (ref 0.0–0.5)
EOS%: 1.9 % (ref 0.0–7.0)
HEMATOCRIT: 43.5 % (ref 38.4–49.9)
HEMOGLOBIN: 14.6 g/dL (ref 13.0–17.1)
LYMPH%: 45.7 % (ref 14.0–49.0)
MCH: 35.1 pg — AB (ref 27.2–33.4)
MCHC: 33.6 g/dL (ref 32.0–36.0)
MCV: 104.7 fL — AB (ref 79.3–98.0)
MONO#: 0.4 10*3/uL (ref 0.1–0.9)
MONO%: 8 % (ref 0.0–14.0)
NEUT#: 2.3 10*3/uL (ref 1.5–6.5)
NEUT%: 43.6 % (ref 39.0–75.0)
Platelets: 222 10*3/uL (ref 140–400)
RBC: 4.16 10*6/uL — ABNORMAL LOW (ref 4.20–5.82)
RDW: 17.6 % — AB (ref 11.0–14.6)
WBC: 5.2 10*3/uL (ref 4.0–10.3)
lymph#: 2.4 10*3/uL (ref 0.9–3.3)

## 2016-10-08 MED ORDER — DEXAMETHASONE SODIUM PHOSPHATE 10 MG/ML IJ SOLN
INTRAMUSCULAR | Status: AC
  Filled 2016-10-08: qty 1

## 2016-10-08 MED ORDER — ATROPINE SULFATE 1 MG/ML IJ SOLN
INTRAMUSCULAR | Status: AC
Start: 2016-10-08 — End: 2016-10-08
  Filled 2016-10-08: qty 1

## 2016-10-08 MED ORDER — SODIUM CHLORIDE 0.9 % IV SOLN: 10.0000 mg | Freq: Once | INTRAVENOUS | Status: DC

## 2016-10-08 MED ORDER — SODIUM CHLORIDE 0.9 % IV SOLN
6.0000 mg/kg | Freq: Once | INTRAVENOUS | Status: AC
  Administered 2016-10-08: 500 mg via INTRAVENOUS
  Filled 2016-10-08: qty 20

## 2016-10-08 MED ORDER — FLUOROURACIL CHEMO INJECTION 2.5 GM/50ML
400.0000 mg/m2 | Freq: Once | INTRAVENOUS | Status: AC
  Administered 2016-10-08: 800 mg via INTRAVENOUS
  Filled 2016-10-08: qty 16

## 2016-10-08 MED ORDER — SODIUM CHLORIDE 0.9 % IV SOLN: Freq: Once | INTRAVENOUS | Status: AC

## 2016-10-08 MED ORDER — PALONOSETRON HCL INJECTION 0.25 MG/5ML
INTRAVENOUS | Status: AC
  Filled 2016-10-08: qty 5

## 2016-10-08 MED ORDER — IRINOTECAN HCL CHEMO INJECTION 100 MG/5ML
182.0000 mg/m2 | Freq: Once | INTRAVENOUS | Status: DC
  Administered 2016-10-08: 380 mg via INTRAVENOUS
  Filled 2016-10-08: qty 19

## 2016-10-08 MED ORDER — SODIUM CHLORIDE 0.9 % IV SOLN
Freq: Once | INTRAVENOUS | Status: AC
  Administered 2016-10-08: 11:00:00 via INTRAVENOUS

## 2016-10-08 MED ORDER — DEXAMETHASONE SODIUM PHOSPHATE 10 MG/ML IJ SOLN
10.0000 mg | Freq: Once | INTRAMUSCULAR | Status: AC
  Administered 2016-10-08: 10 mg via INTRAVENOUS

## 2016-10-08 MED ORDER — LEUCOVORIN CALCIUM INJECTION 350 MG
400.0000 mg/m2 | Freq: Once | INTRAVENOUS | Status: AC
  Administered 2016-10-08: 820 mg via INTRAVENOUS
  Filled 2016-10-08: qty 41

## 2016-10-08 MED ORDER — ATROPINE SULFATE 1 MG/ML IJ SOLN: 0.5000 mg | Freq: Once | INTRAMUSCULAR | Status: DC | PRN

## 2016-10-08 MED ORDER — SODIUM CHLORIDE 0.9 % IV SOLN
2430.0000 mg/m2 | INTRAVENOUS | Status: DC
  Administered 2016-10-08: 5000 mg via INTRAVENOUS
  Filled 2016-10-08: qty 100

## 2016-10-08 MED ORDER — PALONOSETRON HCL INJECTION 0.25 MG/5ML
0.2500 mg | Freq: Once | INTRAVENOUS | Status: AC
  Administered 2016-10-08: 0.25 mg via INTRAVENOUS

## 2016-10-08 NOTE — Progress Notes (Signed)
Patient reports blood in stool noted last night. He noted that it was a small amount with a watery stool. He noted it to be bright red in color.

## 2016-10-08 NOTE — Progress Notes (Signed)
Ok to treat with [facility] from 09/30/16 per MD Benay Spice

## 2016-10-08 NOTE — Patient Instructions (Signed)
[facility] Discharge Instructions for Patients Receiving Chemotherapy  Today you received the following chemotherapy agents Vectibix, Irinotecan, Leucovorin and Adrucil   To help prevent nausea and vomiting after your treatment, we encourage you to take your nausea medication as directed.    If you develop nausea and vomiting that is not controlled by your nausea medication, call the clinic.   BELOW ARE SYMPTOMS THAT SHOULD BE REPORTED IMMEDIATELY:  *FEVER GREATER THAN 100.5 F  *CHILLS WITH OR WITHOUT FEVER  NAUSEA AND VOMITING THAT IS NOT CONTROLLED WITH YOUR NAUSEA MEDICATION  *UNUSUAL SHORTNESS OF BREATH  *UNUSUAL BRUISING OR BLEEDING  TENDERNESS IN MOUTH AND THROAT WITH OR WITHOUT PRESENCE OF ULCERS  *URINARY PROBLEMS  *BOWEL PROBLEMS  UNUSUAL RASH Items with * indicate a potential emergency and should be followed up as soon as possible.  Feel free to call the clinic you have any questions or concerns. The clinic phone number is [phone].  Please show the [facility] at check-in to the Emergency Department and triage nurse.  Panitumumab Solution for Injection What is this medicine? PANITUMUMAB (pan i TOOM ue mab) is a monoclonal antibody. It is used to treat colorectal cancer. This medicine may be used for other purposes; ask your health care provider or pharmacist if you have questions. COMMON BRAND NAME(S): Vectibix What should I tell my health care provider before I take this medicine? They need to know if you have any of these conditions: -eye disease, vision problems -low levels of calcium, magnesium, or potassium in the blood -lung or breathing disease, like asthma -skin conditions or sensitivity -an unusual or allergic reaction to panitumumab, other medicines, foods, dyes, or preservatives -pregnant or trying to get pregnant -breast-feeding How should I use this medicine? This drug is given as an infusion into a vein. It is  administered in a hospital or clinic by a specially trained health care professional. Talk to your pediatrician regarding the use of this medicine in children. Special care may be needed. Overdosage: If you think you have taken too much of this medicine contact a poison control center or emergency room at once. NOTE: This medicine is only for you. Do not share this medicine with others. What if I miss a dose? It is important not to miss your dose. Call your doctor or health care professional if you are unable to keep an appointment. What may interact with this medicine? Do not take this medicine with any of the following medications: -bevacizumab This list may not describe all possible interactions. Give your health care provider a list of all the medicines, herbs, non-prescription drugs, or dietary supplements you use. Also tell them if you smoke, drink alcohol, or use illegal drugs. Some items may interact with your medicine. What should I watch for while using this medicine? Visit your doctor for checks on your progress. This drug may make you feel generally unwell. This is not uncommon, as chemotherapy can affect healthy cells as well as cancer cells. Report any side effects. Continue your course of treatment even though you feel ill unless your doctor tells you to stop. This medicine can make you more sensitive to the sun. Keep out of the sun while receiving this medicine and for 2 months after the last dose. If you cannot avoid being in the sun, wear protective clothing and use sunscreen. Do not use sun lamps or tanning beds/booths. In some cases, you may be given additional medicines to help with side effects.  Follow all directions for their use. Call your doctor or health care professional for advice if you get a fever, chills or sore throat, or other symptoms of a cold or flu. Do not treat yourself. This drug decreases your body's ability to fight infections. Try to avoid being around people  who are sick. Avoid taking products that contain aspirin, acetaminophen, ibuprofen, naproxen, or ketoprofen unless instructed by your doctor. These medicines may hide a fever. Do not become pregnant while taking this medicine and for 2 months after the last dose. Women should inform their doctor if they wish to become pregnant or think they might be pregnant. There is a potential for serious side effects to an unborn child. Talk to your health care professional or pharmacist for more information. Do not breast-feed an infant while taking this medicine or for 2 months after the last dose. What side effects may I notice from receiving this medicine? Side effects that you should report to your doctor or health care professional as soon as possible: -allergic reactions like skin rash, itching or hives, swelling of the face, lips, or tongue -breathing problems -changes in vision -eye pain -fast, irregular heartbeat -fever, chills -mouth sores -red spots on the skin -redness, blistering, peeling or loosening of the skin, including inside the mouth -signs and symptoms of kidney injury like trouble passing urine or change in the amount of urine -signs and symptoms of low blood pressure like dizziness; feeling faint or lightheaded, falls; unusually weak or tired -signs of low calcium like fast heartbeat, muscle cramps or muscle pain; pain, tingling, numbness in the hands or feet; seizures -signs and symptoms of low magnesium like muscle cramps, pain, or weakness; tremors; seizures; or fast, irregular heartbeat -signs and symptoms of low potassium like muscle cramps or muscle pain; chest pain; dizziness; feeling faint or lightheaded, falls; palpitations; breathing problems; or fast, irregular heartbeat -swelling of the ankles, feet, hands Side effects that usually do not require medical attention (report to your doctor or health care professional if they continue or are bothersome): -changes in skin like  acne, cracks, skin dryness -diarrhea -eyelash growth -headache -mouth sores -nail changes -nausea, vomiting This list may not describe all possible side effects. Call your doctor for medical advice about side effects. You may report side effects to FDA at 1-800-FDA-1088. Where should I keep my medicine? This drug is given in a hospital or clinic and will not be stored at home. NOTE: This sheet is a summary. It may not cover all possible information. If you have questions about this medicine, talk to your doctor, pharmacist, or health care provider.  2018 Elsevier/Gold Standard (2015-12-08 16:45:04)  Irinotecan injection What is this medicine? IRINOTECAN (ir in oh TEE kan ) is a chemotherapy drug. It is used to treat colon and rectal cancer. This medicine may be used for other purposes; ask your health care provider or pharmacist if you have questions. COMMON BRAND NAME(S): Camptosar What should I tell my health care provider before I take this medicine? They need to know if you have any of these conditions: -blood disorders -dehydration -diarrhea -infection (especially a virus infection such as chickenpox, cold sores, or herpes) -liver disease -low blood counts, like low white cell, platelet, or red cell counts -recent or ongoing radiation therapy -an unusual or allergic reaction to irinotecan, sorbitol, other chemotherapy, other medicines, foods, dyes, or preservatives -pregnant or trying to get pregnant -breast-feeding How should I use this medicine? This drug is given as an  infusion into a vein. It is administered in a hospital or clinic by a specially trained health care professional. Talk to your pediatrician regarding the use of this medicine in children. Special care may be needed. Overdosage: If you think you have taken too much of this medicine contact a poison control center or emergency room at once. NOTE: This medicine is only for you. Do not share this medicine with  others. What if I miss a dose? It is important not to miss your dose. Call your doctor or health care professional if you are unable to keep an appointment. What may interact with this medicine? Do not take this medicine with any of the following medications: -atazanavir -certain medicines for fungal infections like itraconazole and ketoconazole -St. John's Wort This medicine may also interact with the following medications: -dexamethasone -diuretics -laxatives -medicines for seizures like carbamazepine, mephobarbital, phenobarbital, phenytoin, primidone -medicines to increase blood counts like filgrastim, pegfilgrastim, sargramostim -prochlorperazine -vaccines This list may not describe all possible interactions. Give your health care provider a list of all the medicines, herbs, non-prescription drugs, or dietary supplements you use. Also tell them if you smoke, drink alcohol, or use illegal drugs. Some items may interact with your medicine. What should I watch for while using this medicine? Your condition will be monitored carefully while you are receiving this medicine. You will need important blood work done while you are taking this medicine. This drug may make you feel generally unwell. This is not uncommon, as chemotherapy can affect healthy cells as well as cancer cells. Report any side effects. Continue your course of treatment even though you feel ill unless your doctor tells you to stop. In some cases, you may be given additional medicines to help with side effects. Follow all directions for their use. You may get drowsy or dizzy. Do not drive, use machinery, or do anything that needs mental alertness until you know how this medicine affects you. Do not stand or sit up quickly, especially if you are an older patient. This reduces the risk of dizzy or fainting spells. Call your doctor or health care professional for advice if you get a fever, chills or sore throat, or other symptoms  of a cold or flu. Do not treat yourself. This drug decreases your body's ability to fight infections. Try to avoid being around people who are sick. This medicine may increase your risk to bruise or bleed. Call your doctor or health care professional if you notice any unusual bleeding. Be careful brushing and flossing your teeth or using a toothpick because you may get an infection or bleed more easily. If you have any dental work done, tell your dentist you are receiving this medicine. Avoid taking products that contain aspirin, acetaminophen, ibuprofen, naproxen, or ketoprofen unless instructed by your doctor. These medicines may hide a fever. Do not become pregnant while taking this medicine. Women should inform their doctor if they wish to become pregnant or think they might be pregnant. There is a potential for serious side effects to an unborn child. Talk to your health care professional or pharmacist for more information. Do not breast-feed an infant while taking this medicine. What side effects may I notice from receiving this medicine? Side effects that you should report to your doctor or health care professional as soon as possible: -allergic reactions like skin rash, itching or hives, swelling of the face, lips, or tongue -low blood counts - this medicine may decrease the number of white blood cells,  red blood cells and platelets. You may be at increased risk for infections and bleeding. -signs of infection - fever or chills, cough, sore throat, pain or difficulty passing urine -signs of decreased platelets or bleeding - bruising, pinpoint red spots on the skin, black, tarry stools, blood in the urine -signs of decreased red blood cells - unusually weak or tired, fainting spells, lightheadedness -breathing problems -chest pain -diarrhea -feeling faint or lightheaded, falls -flushing, runny nose, sweating during infusion -mouth sores or pain -pain, swelling, redness or irritation where  injected -pain, swelling, warmth in the leg -pain, tingling, numbness in the hands or feet -problems with balance, talking, walking -stomach cramps, pain -trouble passing urine or change in the amount of urine -vomiting as to be unable to hold down drinks or food -yellowing of the eyes or skin Side effects that usually do not require medical attention (report to your doctor or health care professional if they continue or are bothersome): -constipation -hair loss -headache -loss of appetite -nausea, vomiting -stomach upset This list may not describe all possible side effects. Call your doctor for medical advice about side effects. You may report side effects to FDA at 1-800-FDA-1088. Where should I keep my medicine? This drug is given in a hospital or clinic and will not be stored at home. NOTE: This sheet is a summary. It may not cover all possible information. If you have questions about this medicine, talk to your doctor, pharmacist, or health care provider.  2018 Elsevier/Gold Standard (2012-11-16 16:29:32)  Leucovorin injection What is this medicine? LEUCOVORIN (loo koe VOR in) is used to prevent or treat the harmful effects of some medicines. This medicine is used to treat anemia caused by a low amount of folic acid in the body. It is also used with 5-fluorouracil (5-FU) to treat colon cancer. This medicine may be used for other purposes; ask your health care provider or pharmacist if you have questions. What should I tell my health care provider before I take this medicine? They need to know if you have any of these conditions: -anemia from low levels of vitamin B-12 in the blood -an unusual or allergic reaction to leucovorin, folic acid, other medicines, foods, dyes, or preservatives -pregnant or trying to get pregnant -breast-feeding How should I use this medicine? This medicine is for injection into a muscle or into a vein. It is given by a health care professional in a  hospital or clinic setting. Talk to your pediatrician regarding the use of this medicine in children. Special care may be needed. Overdosage: If you think you have taken too much of this medicine contact a poison control center or emergency room at once. NOTE: This medicine is only for you. Do not share this medicine with others. What if I miss a dose? This does not apply. What may interact with this medicine? -capecitabine -fluorouracil -phenobarbital -phenytoin -primidone -trimethoprim-sulfamethoxazole This list may not describe all possible interactions. Give your health care provider a list of all the medicines, herbs, non-prescription drugs, or dietary supplements you use. Also tell them if you smoke, drink alcohol, or use illegal drugs. Some items may interact with your medicine. What should I watch for while using this medicine? Your condition will be monitored carefully while you are receiving this medicine. This medicine may increase the side effects of 5-fluorouracil, 5-FU. Tell your doctor or health care professional if you have diarrhea or mouth sores that do not get better or that get worse. What side effects may  I notice from receiving this medicine? Side effects that you should report to your doctor or health care professional as soon as possible: -allergic reactions like skin rash, itching or hives, swelling of the face, lips, or tongue -breathing problems -fever, infection -mouth sores -unusual bleeding or bruising -unusually weak or tired Side effects that usually do not require medical attention (report to your doctor or health care professional if they continue or are bothersome): -constipation or diarrhea -loss of appetite -nausea, vomiting This list may not describe all possible side effects. Call your doctor for medical advice about side effects. You may report side effects to FDA at 1-800-FDA-1088. Where should I keep my medicine? This drug is given in a  hospital or clinic and will not be stored at home. NOTE: This sheet is a summary. It may not cover all possible information. If you have questions about this medicine, talk to your doctor, pharmacist, or health care provider.  2018 Elsevier/Gold Standard (2007-11-24 16:50:29)  Fluorouracil, 5-FU injection What is this medicine? FLUOROURACIL, 5-FU (flure oh YOOR a sil) is a chemotherapy drug. It slows the growth of cancer cells. This medicine is used to treat many types of cancer like breast cancer, colon or rectal cancer, pancreatic cancer, and stomach cancer. This medicine may be used for other purposes; ask your health care provider or pharmacist if you have questions. COMMON BRAND NAME(S): Adrucil What should I tell my health care provider before I take this medicine? They need to know if you have any of these conditions: -blood disorders -dihydropyrimidine dehydrogenase (DPD) deficiency -infection (especially a virus infection such as chickenpox, cold sores, or herpes) -kidney disease -liver disease -malnourished, poor nutrition -recent or ongoing radiation therapy -an unusual or allergic reaction to fluorouracil, other chemotherapy, other medicines, foods, dyes, or preservatives -pregnant or trying to get pregnant -breast-feeding How should I use this medicine? This drug is given as an infusion or injection into a vein. It is administered in a hospital or clinic by a specially trained health care professional. Talk to your pediatrician regarding the use of this medicine in children. Special care may be needed. Overdosage: If you think you have taken too much of this medicine contact a poison control center or emergency room at once. NOTE: This medicine is only for you. Do not share this medicine with others. What if I miss a dose? It is important not to miss your dose. Call your doctor or health care professional if you are unable to keep an appointment. What may interact with this  medicine? -allopurinol -cimetidine -dapsone -digoxin -hydroxyurea -leucovorin -levamisole -medicines for seizures like ethotoin, fosphenytoin, phenytoin -medicines to increase blood counts like filgrastim, pegfilgrastim, sargramostim -medicines that treat or prevent blood clots like warfarin, enoxaparin, and dalteparin -methotrexate -metronidazole -pyrimethamine -some other chemotherapy drugs like busulfan, cisplatin, estramustine, vinblastine -trimethoprim -trimetrexate -vaccines Talk to your doctor or health care professional before taking any of these medicines: -acetaminophen -aspirin -ibuprofen -ketoprofen -naproxen This list may not describe all possible interactions. Give your health care provider a list of all the medicines, herbs, non-prescription drugs, or dietary supplements you use. Also tell them if you smoke, drink alcohol, or use illegal drugs. Some items may interact with your medicine. What should I watch for while using this medicine? Visit your doctor for checks on your progress. This drug may make you feel generally unwell. This is not uncommon, as chemotherapy can affect healthy cells as well as cancer cells. Report any side effects. Continue your course of  treatment even though you feel ill unless your doctor tells you to stop. In some cases, you may be given additional medicines to help with side effects. Follow all directions for their use. Call your doctor or health care professional for advice if you get a fever, chills or sore throat, or other symptoms of a cold or flu. Do not treat yourself. This drug decreases your body's ability to fight infections. Try to avoid being around people who are sick. This medicine may increase your risk to bruise or bleed. Call your doctor or health care professional if you notice any unusual bleeding. Be careful brushing and flossing your teeth or using a toothpick because you may get an infection or bleed more easily. If you  have any dental work done, tell your dentist you are receiving this medicine. Avoid taking products that contain aspirin, acetaminophen, ibuprofen, naproxen, or ketoprofen unless instructed by your doctor. These medicines may hide a fever. Do not become pregnant while taking this medicine. Women should inform their doctor if they wish to become pregnant or think they might be pregnant. There is a potential for serious side effects to an unborn child. Talk to your health care professional or pharmacist for more information. Do not breast-feed an infant while taking this medicine. Men should inform their doctor if they wish to father a child. This medicine may lower sperm counts. Do not treat diarrhea with over the counter products. Contact your doctor if you have diarrhea that lasts more than 2 days or if it is severe and watery. This medicine can make you more sensitive to the sun. Keep out of the sun. If you cannot avoid being in the sun, wear protective clothing and use sunscreen. Do not use sun lamps or tanning beds/booths. What side effects may I notice from receiving this medicine? Side effects that you should report to your doctor or health care professional as soon as possible: -allergic reactions like skin rash, itching or hives, swelling of the face, lips, or tongue -low blood counts - this medicine may decrease the number of white blood cells, red blood cells and platelets. You may be at increased risk for infections and bleeding. -signs of infection - fever or chills, cough, sore throat, pain or difficulty passing urine -signs of decreased platelets or bleeding - bruising, pinpoint red spots on the skin, black, tarry stools, blood in the urine -signs of decreased red blood cells - unusually weak or tired, fainting spells, lightheadedness -breathing problems -changes in vision -chest pain -mouth sores -nausea and vomiting -pain, swelling, redness at site where injected -pain, tingling,  numbness in the hands or feet -redness, swelling, or sores on hands or feet -stomach pain -unusual bleeding Side effects that usually do not require medical attention (report to your doctor or health care professional if they continue or are bothersome): -changes in finger or toe nails -diarrhea -dry or itchy skin -hair loss -headache -loss of appetite -sensitivity of eyes to the light -stomach upset -unusually teary eyes This list may not describe all possible side effects. Call your doctor for medical advice about side effects. You may report side effects to FDA at 1-800-FDA-1088. Where should I keep my medicine? This drug is given in a hospital or clinic and will not be stored at home. NOTE: This sheet is a summary. It may not cover all possible information. If you have questions about this medicine, talk to your doctor, pharmacist, or health care provider.  2018 Elsevier/Gold Standard (2007-09-23 13:53:16)

## 2016-10-09 ENCOUNTER — Telehealth: Payer: Self-pay | Admitting: *Deleted

## 2016-10-09 MED ORDER — MINOCYCLINE HCL 100 MG PO CAPS: 100.0000 mg | ORAL_CAPSULE | Freq: Two times a day (BID) | ORAL | 2 refills | Status: DC

## 2016-10-09 NOTE — Telephone Encounter (Signed)
Called pt, reviewed chemo side effects and management. He denies any diarrhea, nausea or rash. Instructed him to begin minocycline BID for rash prophylaxis. He voiced understanding. Reviewed # to call for symptom management. Pt requested adjustment to chemo appt so he can have treatment and office visit same day. Message to schedulers.

## 2016-10-09 NOTE — Telephone Encounter (Signed)
-----   Message from Veverly Fells, RN sent at 10/08/2016  5:42 PM EDT ----- Regarding: First-time Follow-up: MD SHERRILL Contact: [phone] [facility], a patient of MD Benay Spice received Irinotecan and Vectibix for the first time today. He is not a new patient to chemo. He tolerated his treatment well with no complaints.

## 2016-10-10 ENCOUNTER — Ambulatory Visit (HOSPITAL_BASED_OUTPATIENT_CLINIC_OR_DEPARTMENT_OTHER): Payer: BLUE CROSS/BLUE SHIELD

## 2016-10-10 VITALS — BP 127/90 | HR 70 | Temp 98.2°F | Resp 16

## 2016-10-10 DIAGNOSIS — Z452 Encounter for adjustment and management of vascular access device: Secondary | ICD-10-CM | POA: Diagnosis not present

## 2016-10-10 DIAGNOSIS — C20 Malignant neoplasm of rectum: Secondary | ICD-10-CM

## 2016-10-10 MED ORDER — SODIUM CHLORIDE 0.9% FLUSH
10.0000 mL | INTRAVENOUS | Status: DC | PRN
  Administered 2016-10-10: 10 mL
  Filled 2016-10-10: qty 10

## 2016-10-10 MED ORDER — HEPARIN SOD (PORK) LOCK FLUSH 100 UNIT/ML IV SOLN
500.0000 [IU] | Freq: Once | INTRAVENOUS | Status: AC | PRN
  Administered 2016-10-10: 500 [IU]
  Filled 2016-10-10: qty 5

## 2016-10-11 ENCOUNTER — Telehealth: Payer: Self-pay | Admitting: Oncology

## 2016-10-11 NOTE — Telephone Encounter (Signed)
sw pt to confirm 5/23 appts per sch msg

## 2016-10-20 ENCOUNTER — Other Ambulatory Visit: Payer: Self-pay | Admitting: Oncology

## 2016-10-21 ENCOUNTER — Telehealth: Payer: Self-pay | Admitting: *Deleted

## 2016-10-21 NOTE — Telephone Encounter (Signed)
Pt is requesting to move appts back to Mondays for next cycle. Pt has arranged for transportation on Mondays. Will review with MD.  OK to schedule next treatment for 6/4 per MD. Message to schedulers.

## 2016-10-22 ENCOUNTER — Other Ambulatory Visit: Payer: BLUE CROSS/BLUE SHIELD

## 2016-10-22 ENCOUNTER — Ambulatory Visit: Payer: BLUE CROSS/BLUE SHIELD | Admitting: Oncology

## 2016-10-23 ENCOUNTER — Other Ambulatory Visit (HOSPITAL_BASED_OUTPATIENT_CLINIC_OR_DEPARTMENT_OTHER): Payer: BLUE CROSS/BLUE SHIELD

## 2016-10-23 ENCOUNTER — Telehealth: Payer: Self-pay | Admitting: Oncology

## 2016-10-23 ENCOUNTER — Ambulatory Visit (HOSPITAL_BASED_OUTPATIENT_CLINIC_OR_DEPARTMENT_OTHER): Payer: BLUE CROSS/BLUE SHIELD | Admitting: Oncology

## 2016-10-23 ENCOUNTER — Ambulatory Visit (HOSPITAL_BASED_OUTPATIENT_CLINIC_OR_DEPARTMENT_OTHER): Payer: BLUE CROSS/BLUE SHIELD

## 2016-10-23 VITALS — BP 138/86 | HR 62 | Temp 98.2°F | Resp 17 | Wt 179.5 lb

## 2016-10-23 DIAGNOSIS — Z5112 Encounter for antineoplastic immunotherapy: Secondary | ICD-10-CM | POA: Diagnosis not present

## 2016-10-23 DIAGNOSIS — C20 Malignant neoplasm of rectum: Secondary | ICD-10-CM | POA: Diagnosis not present

## 2016-10-23 DIAGNOSIS — R21 Rash and other nonspecific skin eruption: Secondary | ICD-10-CM | POA: Diagnosis not present

## 2016-10-23 DIAGNOSIS — D709 Neutropenia, unspecified: Secondary | ICD-10-CM | POA: Diagnosis not present

## 2016-10-23 DIAGNOSIS — Z95828 Presence of other vascular implants and grafts: Secondary | ICD-10-CM

## 2016-10-23 LAB — CBC WITH DIFFERENTIAL/PLATELET
BASO%: 0.5 % (ref 0.0–2.0)
Basophils Absolute: 0 10*3/uL (ref 0.0–0.1)
EOS%: 2.9 % (ref 0.0–7.0)
Eosinophils Absolute: 0.1 10*3/uL (ref 0.0–0.5)
HCT: 42.4 % (ref 38.4–49.9)
HGB: 13.8 g/dL (ref 13.0–17.1)
LYMPH#: 2.2 10*3/uL (ref 0.9–3.3)
LYMPH%: 59.6 % — ABNORMAL HIGH (ref 14.0–49.0)
MCH: 34 pg — AB (ref 27.2–33.4)
MCHC: 32.5 g/dL (ref 32.0–36.0)
MCV: 104.4 fL — ABNORMAL HIGH (ref 79.3–98.0)
MONO#: 0.4 10*3/uL (ref 0.1–0.9)
MONO%: 11.8 % (ref 0.0–14.0)
NEUT#: 0.9 10*3/uL — ABNORMAL LOW (ref 1.5–6.5)
NEUT%: 25.2 % — AB (ref 39.0–75.0)
Platelets: 172 10*3/uL (ref 140–400)
RBC: 4.06 10*6/uL — AB (ref 4.20–5.82)
RDW: 15.5 % — AB (ref 11.0–14.6)
WBC: 3.7 10*3/uL — ABNORMAL LOW (ref 4.0–10.3)

## 2016-10-23 LAB — COMPREHENSIVE METABOLIC PANEL
ALT: 10 U/L (ref 0–55)
AST: 17 U/L (ref 5–34)
Albumin: 3.6 g/dL (ref 3.5–5.0)
Alkaline Phosphatase: 77 U/L (ref 40–150)
Anion Gap: 11 mEq/L (ref 3–11)
BUN: 17.1 mg/dL (ref 7.0–26.0)
CHLORIDE: 106 meq/L (ref 98–109)
CO2: 25 mEq/L (ref 22–29)
Calcium: 9 mg/dL (ref 8.4–10.4)
Creatinine: 1.2 mg/dL (ref 0.7–1.3)
EGFR: 73 mL/min/{1.73_m2} — ABNORMAL LOW (ref >90)
GLUCOSE: 92 mg/dL (ref 70–140)
POTASSIUM: 4 meq/L (ref 3.5–5.1)
Sodium: 143 mEq/L (ref 136–145)
Total Bilirubin: 0.45 mg/dL (ref 0.20–1.20)
Total Protein: 7.6 g/dL (ref 6.4–8.3)

## 2016-10-23 LAB — MAGNESIUM: MAGNESIUM: 2.1 mg/dL (ref 1.5–2.5)

## 2016-10-23 MED ORDER — SODIUM CHLORIDE 0.9 % IV SOLN
Freq: Once | INTRAVENOUS | Status: AC
  Administered 2016-10-23: 10:00:00 via INTRAVENOUS

## 2016-10-23 MED ORDER — HEPARIN SOD (PORK) LOCK FLUSH 100 UNIT/ML IV SOLN
500.0000 [IU] | Freq: Once | INTRAVENOUS | Status: AC | PRN
  Administered 2016-10-23: 500 [IU]
  Filled 2016-10-23: qty 5

## 2016-10-23 MED ORDER — SODIUM CHLORIDE 0.9% FLUSH
10.0000 mL | INTRAVENOUS | Status: DC | PRN
  Administered 2016-10-23: 10 mL
  Filled 2016-10-23: qty 10

## 2016-10-23 MED ORDER — SODIUM CHLORIDE 0.9 % IV SOLN
6.0000 mg/kg | Freq: Once | INTRAVENOUS | Status: AC
  Administered 2016-10-23: 500 mg via INTRAVENOUS
  Filled 2016-10-23: qty 20

## 2016-10-23 NOTE — Telephone Encounter (Signed)
sch appts per 5/23 LOS. LT to see pt in infusion rm 6/4 due to unable to sync all appts reasonably. Pt aware of upcoming appts

## 2016-10-23 NOTE — Patient Instructions (Addendum)
[facility] Discharge Instructions for Patients Receiving Chemotherapy  Today you received the following chemotherapy agents Vectibix  To help prevent nausea and vomiting after your treatment, we encourage you to take your nausea medication as directed.   If you develop nausea and vomiting that is not controlled by your nausea medication, call the clinic.   BELOW ARE SYMPTOMS THAT SHOULD BE REPORTED IMMEDIATELY:  *FEVER GREATER THAN 100.5 F  *CHILLS WITH OR WITHOUT FEVER  NAUSEA AND VOMITING THAT IS NOT CONTROLLED WITH YOUR NAUSEA MEDICATION  *UNUSUAL SHORTNESS OF BREATH  *UNUSUAL BRUISING OR BLEEDING  TENDERNESS IN MOUTH AND THROAT WITH OR WITHOUT PRESENCE OF ULCERS  *URINARY PROBLEMS  *BOWEL PROBLEMS  UNUSUAL RASH Items with * indicate a potential emergency and should be followed up as soon as possible.  Feel free to call the clinic you have any questions or concerns. The clinic phone number is [phone].  Please show the CHEMO ALERT CARD at check-in to the Emergency Department and triage nurse.   

## 2016-10-23 NOTE — Progress Notes (Signed)
Dr. Benay Spice to infusion to assess pt. Per Dr. Benay Spice okay to proceed with treatment with today's labs (WBC 3.7, ANC 0.9), with Panitumumab only. Pt will begin Nuelasa with next Folfiri cycle per Dr. Benay Spice. Pt verbalizes understanding. pt denies any s/s of infection. Pt educated to monitor for s/s of infection and to call clinic if any symptoms occur. Pt verbalizes understanding.

## 2016-10-23 NOTE — Progress Notes (Signed)
[facility] OFFICE PROGRESS NOTE   Diagnosis: Colon cancer  INTERVAL HISTORY:   [name] returns as scheduled. He completed a first cycle of FOLFIRI/panitumumab on 10/08/2016. No nausea, mouth sores, or diarrhea. He feels well. He reports a mild rash over the face. He is taking minocycline.  Objective:  Vital signs in last 24 hours:  There were no vitals taken for this visit.    HEENT: No thrush or ulcers Resp: Lungs clear bilaterally Cardio: Regular rate and rhythm GI: No hepatosplenomegaly, nontender Vascular: No leg edema  Skin: Mild erythematous rash at the malar areas. Few erythematous lesions at the upper back   Portacath/PICC-without erythema  Lab Results:  Lab Results  Component Value Date   WBC 3.7 (L) 10/23/2016   HGB 13.8 10/23/2016   HCT 42.4 10/23/2016   MCV 104.4 (H) 10/23/2016   PLT 172 10/23/2016   NEUTROABS 0.9 (L) 10/23/2016    CMP     Component Value Date/Time   NA 143 10/23/2016 0735   K 4.0 10/23/2016 0735   CL 109 07/30/2015 1844   CO2 25 10/23/2016 0735   GLUCOSE 92 10/23/2016 0735   BUN 17.1 10/23/2016 0735   CREATININE 1.2 10/23/2016 0735   CALCIUM 9.0 10/23/2016 0735   PROT 7.6 10/23/2016 0735   ALBUMIN 3.6 10/23/2016 0735   AST 17 10/23/2016 0735   ALT 10 10/23/2016 0735   ALKPHOS 77 10/23/2016 0735   BILITOT 0.45 10/23/2016 0735   GFRNONAA >60 07/30/2015 1844   GFRAA >60 07/30/2015 1844    Lab Results  Component Value Date   CEA1 3.24 09/30/2016    Medications: I have reviewed the patient's current medications.  Assessment/Plan: 1. Rectal cancer, invasive adenocarcinoma confirmed at colonoscopy 05/22/2015, EUS staging 06/01/2015 revealed a uT3,N1 lesion at 7 cm from the anal verge  Staging CT abdomen/pelvis 05/15/2015 confirmed a mass at the rectosigmoid colon, a suspicious perirectal lymph node, and a 6 mm right liver lesion felt to most likely be a cyst  CT chest 05/30/2015 with indeterminate  pulmonary nodules and multiple liver lesions suspicious for metastases  Ultrasound 06/13/2014 with no liver lesions identified  MRI abdomen 06/21/2015 consistent with multiple liver metastases  CT biopsy of liver lesion 06/26/2015. Pathology showed metastatic adenocarcinoma consistent with colonic primary. MSI-stable, low mutation burden, no  RAS or BRAF mutation  Cycle 1 FOLFOX 07/10/2015  Cycle 2 FOLFOX plus Avastin 07/24/2015 with Neulasta support  Cycle 3 FOLFOX plus Avastin 08/07/2015  Cycle 4 FOLFOX plus Avastin 08/21/2015  cycle 5 FOLFOX plus Avastin 09/04/2015  Restaging CTs 09/15/2015 revealed a decrease in the size of liver lesions, decreased rectal primary, stable/decreased size of tiny lung nodules  Cycle 6 FOLFOX plus Avastin 09/18/2015  Cycle 7 FOLFOX plus Avastin 10/02/2015  Cycle 8 FOLFOX plus Avastin 10/16/2015  Cycle 9 FOLFOX plus Avastin 11/06/2015  Cycle 10 FOLFOX plus Avastin 11/20/2015  CTs 12/07/2015-mild decrease in wall thickening at the rectum, decrease in tiny hepatic metastases, stable lung nodules, no evidence of progressive metastatic disease, new left lower lobe infiltrate  Treatment changed to every three-week Avastin with every other week Xeloda beginning 12/11/2015  Restaging CTs 04/12/2016-interval increase in size of adjacent right upper lobe pulmonary nodules and right lower lobe pulmonary nodule; unchanged 5 mm lesion right hepatic lobe; no additional visualized hepatic metastasis. Grossly unchanged wall thickening of the rectum.  Continuation of Xeloda every other week and every three-week AvastinXeloda placed on hold 05/06/2016 due to hand-foot syndrome  Xeloda  resumed 05/28/2016 1500 mg every morning and 1000 mg every afternoon 7 days on/7 days off  CT 09/27/2016-enlargement of lung nodules and new liver lesions, stable rectal mass  Cycle 1 FOLFIRI/panitumumab 10/08/2016  Cycle 2 panitumumab 10/23/2016 (FOLFIRI held secondary to  neutropenia) 2.Multiple colon polyps on the colonoscopy 05/22/2015, tubular villous adenoma and a tubular adenoma were removed 3.Anemia-likely secondary to rectal bleeding.  4.Family history of multiple cancers including breast and prostate cancer 5.Transient right arm numbness 06/20/2015 6. Port-A-Cath placement 07/06/2015 by Dr. Marcello Moores 7. Mild neutropenia secondary to chemotherapy following cycle 1 FOLFOX. Neulasta added with cycle 2. 8. Delayed nausea following chemotherapy-emend added with cycle 3 FOLFOX 9. Early oxaliplatin neuropathy 10. 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 Xeloda resumed at a reduced dose. 11. Bilateral hand weakness. Question related to previous oxaliplatin. Referred to neurology 06/17/2016. 12. Hypertension-amlodipine started 07/08/2016    Disposition:  [name] tolerated the first cycle of FOLFIRI/panitumumab well. He has developed mild neutropenia. Chemotherapy will be held today. Neulasta will be added with the next cycle of FOLFIRI. He will be treated with single agent panitumumab today.  He will contact us for worsening of the skin rash. [name] will return for an office visit and the next cycle of chemotherapy on 11/04/2016.  25 minutes were spent with the patient today. The majority of the time was used for counseling and coordination of care.  Betsy Coder, MD  10/23/2016  10:03 AM

## 2016-11-03 ENCOUNTER — Other Ambulatory Visit: Payer: Self-pay | Admitting: Oncology

## 2016-11-04 ENCOUNTER — Other Ambulatory Visit: Payer: BLUE CROSS/BLUE SHIELD

## 2016-11-04 ENCOUNTER — Ambulatory Visit (HOSPITAL_BASED_OUTPATIENT_CLINIC_OR_DEPARTMENT_OTHER): Payer: BLUE CROSS/BLUE SHIELD | Admitting: Nurse Practitioner

## 2016-11-04 ENCOUNTER — Ambulatory Visit (HOSPITAL_BASED_OUTPATIENT_CLINIC_OR_DEPARTMENT_OTHER): Payer: BLUE CROSS/BLUE SHIELD

## 2016-11-04 ENCOUNTER — Telehealth: Payer: Self-pay | Admitting: Oncology

## 2016-11-04 VITALS — BP 132/86 | HR 61 | Temp 98.3°F | Resp 16

## 2016-11-04 DIAGNOSIS — C787 Secondary malignant neoplasm of liver and intrahepatic bile duct: Secondary | ICD-10-CM

## 2016-11-04 DIAGNOSIS — Z5112 Encounter for antineoplastic immunotherapy: Secondary | ICD-10-CM | POA: Diagnosis not present

## 2016-11-04 DIAGNOSIS — Z5111 Encounter for antineoplastic chemotherapy: Secondary | ICD-10-CM

## 2016-11-04 DIAGNOSIS — C20 Malignant neoplasm of rectum: Secondary | ICD-10-CM

## 2016-11-04 DIAGNOSIS — R21 Rash and other nonspecific skin eruption: Secondary | ICD-10-CM | POA: Diagnosis not present

## 2016-11-04 LAB — CBC WITH DIFFERENTIAL/PLATELET
BASO%: 0.6 % (ref 0.0–2.0)
BASOS ABS: 0 10*3/uL (ref 0.0–0.1)
EOS%: 2.1 % (ref 0.0–7.0)
Eosinophils Absolute: 0.1 10*3/uL (ref 0.0–0.5)
HCT: 44.4 % (ref 38.4–49.9)
HGB: 14.5 g/dL (ref 13.0–17.1)
LYMPH%: 45 % (ref 14.0–49.0)
MCH: 34.2 pg — AB (ref 27.2–33.4)
MCHC: 32.7 g/dL (ref 32.0–36.0)
MCV: 104.7 fL — ABNORMAL HIGH (ref 79.3–98.0)
MONO#: 0.5 10*3/uL (ref 0.1–0.9)
MONO%: 8.4 % (ref 0.0–14.0)
NEUT#: 2.7 10*3/uL (ref 1.5–6.5)
NEUT%: 43.9 % (ref 39.0–75.0)
Platelets: 159 10*3/uL (ref 140–400)
RBC: 4.24 10*6/uL (ref 4.20–5.82)
RDW: 15.9 % — ABNORMAL HIGH (ref 11.0–14.6)
WBC: 6.2 10*3/uL (ref 4.0–10.3)
lymph#: 2.8 10*3/uL (ref 0.9–3.3)

## 2016-11-04 LAB — MAGNESIUM: MAGNESIUM: 2 mg/dL (ref 1.5–2.5)

## 2016-11-04 LAB — COMPREHENSIVE METABOLIC PANEL
ALT: 15 U/L (ref 0–55)
AST: 22 U/L (ref 5–34)
Albumin: 3.4 g/dL — ABNORMAL LOW (ref 3.5–5.0)
Alkaline Phosphatase: 79 U/L (ref 40–150)
Anion Gap: 12 mEq/L — ABNORMAL HIGH (ref 3–11)
BUN: 16.4 mg/dL (ref 7.0–26.0)
CALCIUM: 9 mg/dL (ref 8.4–10.4)
CHLORIDE: 107 meq/L (ref 98–109)
CO2: 24 mEq/L (ref 22–29)
Creatinine: 1.3 mg/dL (ref 0.7–1.3)
EGFR: 68 mL/min/{1.73_m2} — ABNORMAL LOW (ref >90)
GLUCOSE: 112 mg/dL (ref 70–140)
POTASSIUM: 4.2 meq/L (ref 3.5–5.1)
Sodium: 142 mEq/L (ref 136–145)
Total Bilirubin: 0.3 mg/dL (ref 0.20–1.20)
Total Protein: 7.4 g/dL (ref 6.4–8.3)

## 2016-11-04 MED ORDER — ATROPINE SULFATE 1 MG/ML IJ SOLN: 0.5000 mg | Freq: Once | INTRAMUSCULAR | Status: DC | PRN

## 2016-11-04 MED ORDER — MINOCYCLINE HCL 100 MG PO CAPS: 100.0000 mg | ORAL_CAPSULE | Freq: Two times a day (BID) | ORAL | 2 refills | Status: DC

## 2016-11-04 MED ORDER — SODIUM CHLORIDE 0.9 % IV SOLN
2450.0000 mg/m2 | INTRAVENOUS | Status: DC
  Administered 2016-11-04: 5000 mg via INTRAVENOUS
  Filled 2016-11-04: qty 100

## 2016-11-04 MED ORDER — SODIUM CHLORIDE 0.9 % IV SOLN
6.0000 mg/kg | Freq: Once | INTRAVENOUS | Status: AC
  Administered 2016-11-04: 500 mg via INTRAVENOUS
  Filled 2016-11-04: qty 20

## 2016-11-04 MED ORDER — PALONOSETRON HCL INJECTION 0.25 MG/5ML
0.2500 mg | Freq: Once | INTRAVENOUS | Status: AC
  Administered 2016-11-04: 0.25 mg via INTRAVENOUS

## 2016-11-04 MED ORDER — FLUOROURACIL CHEMO INJECTION 2.5 GM/50ML
400.0000 mg/m2 | Freq: Once | INTRAVENOUS | Status: AC
  Administered 2016-11-04: 800 mg via INTRAVENOUS
  Filled 2016-11-04: qty 16

## 2016-11-04 MED ORDER — IRINOTECAN HCL CHEMO INJECTION 100 MG/5ML
185.0000 mg/m2 | Freq: Once | INTRAVENOUS | Status: AC
  Administered 2016-11-04: 380 mg via INTRAVENOUS
  Filled 2016-11-04: qty 15

## 2016-11-04 MED ORDER — DEXAMETHASONE SODIUM PHOSPHATE 10 MG/ML IJ SOLN
10.0000 mg | Freq: Once | INTRAMUSCULAR | Status: AC
  Administered 2016-11-04: 10 mg via INTRAVENOUS

## 2016-11-04 MED ORDER — PALONOSETRON HCL INJECTION 0.25 MG/5ML
INTRAVENOUS | Status: AC
  Filled 2016-11-04: qty 5

## 2016-11-04 MED ORDER — SODIUM CHLORIDE 0.9 % IV SOLN
Freq: Once | INTRAVENOUS | Status: AC
  Administered 2016-11-04: 08:00:00 via INTRAVENOUS

## 2016-11-04 MED ORDER — LEUCOVORIN CALCIUM INJECTION 350 MG
400.0000 mg/m2 | Freq: Once | INTRAMUSCULAR | Status: AC
  Administered 2016-11-04: 820 mg via INTRAVENOUS
  Filled 2016-11-04: qty 41

## 2016-11-04 MED ORDER — DEXAMETHASONE SODIUM PHOSPHATE 10 MG/ML IJ SOLN
INTRAMUSCULAR | Status: AC
  Filled 2016-11-04: qty 1

## 2016-11-04 NOTE — Progress Notes (Signed)
[place] OFFICE PROGRESS NOTE   Diagnosis:  Colon cancer  INTERVAL HISTORY:   [name] returns as scheduled. He completed cycle 2 PANITUMUMAB 10/23/2016. FOLFIRI was held due to neutropenia. He denies nausea/vomiting. No mouth sores. No diarrhea. He notes a faint rash on his face. He continues minocycline.  Objective:  Vital signs in last 24 hours:  Temperature 98.3, heart rate 61, respirations 16, blood pressure 132/86    HEENT: No thrush or ulcers. Resp: Lungs clear bilaterally. Cardio: Regular rate and rhythm. GI: Abdomen soft and nontender. No hepatomegaly. Vascular: No leg edema. Skin: Faint acne-type rash over the face and upper back. Port-A-Cath without erythema.    Lab Results:  Lab Results  Component Value Date   WBC 6.2 11/04/2016   HGB 14.5 11/04/2016   HCT 44.4 11/04/2016   MCV 104.7 (H) 11/04/2016   PLT 159 11/04/2016   NEUTROABS 2.7 11/04/2016    Imaging:  No results found.  Medications: I have reviewed the patient's current medications.  Assessment/Plan: 1. Rectal cancer, invasive adenocarcinoma confirmed at colonoscopy 05/22/2015, EUS staging 06/01/2015 revealed a uT3,N1 lesion at 7 cm from the anal verge  Staging CT abdomen/pelvis 05/15/2015 confirmed a mass at the rectosigmoid colon, a suspicious perirectal lymph node, and a 6 mm right liver lesion felt to most likely be a cyst  CT chest 05/30/2015 with indeterminate pulmonary nodules and multiple liver lesions suspicious for metastases  Ultrasound 06/13/2014 with no liver lesions identified  MRI abdomen 06/21/2015 consistent with multiple liver metastases  CT biopsy of liver lesion 06/26/2015. Pathology showed metastatic adenocarcinoma consistent with colonic primary.MSI-stable, low mutation burden, no RAS or BRAFmutation  Cycle 1 FOLFOX 07/10/2015  Cycle 2 FOLFOX plus Avastin 07/24/2015 with Neulasta support  Cycle 3 FOLFOX plus Avastin 08/07/2015  Cycle 4  FOLFOX plus Avastin 08/21/2015  cycle 5 FOLFOX plus Avastin 09/04/2015  Restaging CTs 09/15/2015 revealed a decrease in the size of liver lesions, decreased rectal primary, stable/decreased size of tiny lung nodules  Cycle 6 FOLFOX plus Avastin 09/18/2015  Cycle 7 FOLFOX plus Avastin 10/02/2015  Cycle 8 FOLFOX plus Avastin 10/16/2015  Cycle 9 FOLFOX plus Avastin 11/06/2015  Cycle 10 FOLFOX plus Avastin 11/20/2015  CTs 12/07/2015-mild decrease in wall thickening at the rectum, decrease in tiny hepatic metastases, stable lung nodules, no evidence of progressive metastatic disease, new left lower lobe infiltrate  Treatment changed to every three-week Avastin with every other week Xeloda beginning 12/11/2015  Restaging CTs 04/12/2016-interval increase in size of adjacent right upper lobe pulmonary nodules and right lower lobe pulmonary nodule; unchanged 5 mm lesion right hepatic lobe; no additional visualized hepatic metastasis. Grossly unchanged wall thickening of the rectum.  Continuation of Xeloda every other week and every three-week AvastinXeloda placed on hold 05/06/2016 due to hand-foot syndrome  Xeloda resumed 05/28/2016 1500 mg every morning and 1000 mg every afternoon 7 days on/7 days off  CT 09/27/2016-enlargement of lung nodules and new liver lesions, stable rectal mass  Cycle 1 FOLFIRI/panitumumab 10/08/2016  Cycle 2 panitumumab 10/23/2016 (FOLFIRI held secondary to neutropenia)  Cycle 3 FOLFIRI/PANITUMUMAB 11/04/2016, Neulasta added 2.Multiple colon polyps on the colonoscopy 05/22/2015, tubular villous adenoma and a tubular adenoma were removed 3.Anemia-likely secondary to rectal bleeding.  4.Family history of multiple cancers including breast and prostate cancer 5.Transient right arm numbness 06/20/2015 6. Port-A-Cath placement 07/06/2015 by Dr. Marcello Moores 7. Mild neutropenia secondary to chemotherapy following cycle 1 FOLFOX. Neulasta added with cycle 2. 8. Delayed  nausea following chemotherapy-emend added with  cycle 3 FOLFOX 9. Early oxaliplatin neuropathy 10. 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 Xeloda resumed at a reduced dose. 11. Bilateral hand weakness. Question related to previous oxaliplatin. Referred to neurology 06/17/2016. 12. Hypertension-amlodipine started 07/08/2016 13. Rash secondary to PANITUMUMAB. He continues minocycline. 14. Neutropenia following cycle 1 FOLFIRI/PANITUMUMAB. Neulasta added beginning 11/06/2016.     Disposition: [name] appears stable. He completed cycle 2 PANITUMUMAB 10/23/2016. FOLFIRI was held due to neutropenia. Plan to proceed with FOLFIRI/PANITUMUMAB today as scheduled. He will receive Neulasta on the day of pump discontinuation. Potential toxicities associated with Neulasta reviewed. He is agreeable to proceed. He will return for a follow-up visit and treatment in 2 weeks. He will contact the office in the interim with any problems.    Ned Card ANP/GNP-BC   11/04/2016  10:26 AM

## 2016-11-04 NOTE — Telephone Encounter (Signed)
Appointments per Lab, flush, Chemo and follow up with [facility] was scheduled per 11/04/16 los. Neulasta Injection, per 11/04/16 los was scheduled for 12/05/16, due to [place] on 12/04/16. Patient was given a copy of the AVS report and appointment schedule per 11/04/16 los.

## 2016-11-04 NOTE — Patient Instructions (Signed)
[facility] Discharge Instructions for Patients Receiving Chemotherapy  Today you received the following chemotherapy agents Vectibix, Irinotecan, Leucovorin and Adrucil   To help prevent nausea and vomiting after your treatment, we encourage you to take your nausea medication as directed. No Zofran for 3 days. Take Compazine instead.    If you develop nausea and vomiting that is not controlled by your nausea medication, call the clinic.   BELOW ARE SYMPTOMS THAT SHOULD BE REPORTED IMMEDIATELY:  *FEVER GREATER THAN 100.5 F  *CHILLS WITH OR WITHOUT FEVER  NAUSEA AND VOMITING THAT IS NOT CONTROLLED WITH YOUR NAUSEA MEDICATION  *UNUSUAL SHORTNESS OF BREATH  *UNUSUAL BRUISING OR BLEEDING  TENDERNESS IN MOUTH AND THROAT WITH OR WITHOUT PRESENCE OF ULCERS  *URINARY PROBLEMS  *BOWEL PROBLEMS  UNUSUAL RASH Items with * indicate a potential emergency and should be followed up as soon as possible.  Feel free to call the clinic you have any questions or concerns. The clinic phone number is [phone].  Please show the [place] at check-in to the Emergency Department and triage nurse.

## 2016-11-06 ENCOUNTER — Ambulatory Visit: Payer: BLUE CROSS/BLUE SHIELD

## 2016-11-06 ENCOUNTER — Ambulatory Visit (HOSPITAL_BASED_OUTPATIENT_CLINIC_OR_DEPARTMENT_OTHER): Payer: BLUE CROSS/BLUE SHIELD

## 2016-11-06 DIAGNOSIS — C20 Malignant neoplasm of rectum: Secondary | ICD-10-CM | POA: Diagnosis not present

## 2016-11-06 MED ORDER — HEPARIN SOD (PORK) LOCK FLUSH 100 UNIT/ML IV SOLN
500.0000 [IU] | Freq: Once | INTRAVENOUS | Status: AC | PRN
  Administered 2016-11-06: 500 [IU]
  Filled 2016-11-06: qty 5

## 2016-11-06 MED ORDER — PEGFILGRASTIM INJECTION 6 MG/0.6ML ~~LOC~~: 6.0000 mg | PREFILLED_SYRINGE | Freq: Once | SUBCUTANEOUS | Status: DC

## 2016-11-06 MED ORDER — SODIUM CHLORIDE 0.9% FLUSH
10.0000 mL | INTRAVENOUS | Status: DC | PRN
  Administered 2016-11-06: 10 mL
  Filled 2016-11-06: qty 10

## 2016-11-06 NOTE — Progress Notes (Signed)
Patient did not receive Neulasta today.  Insurance approval is pending, per pharmacy.  Details explained to patient. Verbalized understanding.  Ned Card, NP is aware.

## 2016-11-17 ENCOUNTER — Other Ambulatory Visit: Payer: Self-pay | Admitting: Oncology

## 2016-11-18 ENCOUNTER — Ambulatory Visit (HOSPITAL_BASED_OUTPATIENT_CLINIC_OR_DEPARTMENT_OTHER): Payer: BLUE CROSS/BLUE SHIELD

## 2016-11-18 ENCOUNTER — Other Ambulatory Visit: Payer: Self-pay | Admitting: *Deleted

## 2016-11-18 ENCOUNTER — Telehealth: Payer: Self-pay | Admitting: Oncology

## 2016-11-18 ENCOUNTER — Other Ambulatory Visit (HOSPITAL_BASED_OUTPATIENT_CLINIC_OR_DEPARTMENT_OTHER): Payer: BLUE CROSS/BLUE SHIELD

## 2016-11-18 ENCOUNTER — Ambulatory Visit (HOSPITAL_BASED_OUTPATIENT_CLINIC_OR_DEPARTMENT_OTHER): Payer: BLUE CROSS/BLUE SHIELD | Admitting: Oncology

## 2016-11-18 VITALS — BP 133/79 | HR 77 | Temp 98.2°F | Resp 18 | Ht 72.0 in | Wt 181.5 lb

## 2016-11-18 DIAGNOSIS — C20 Malignant neoplasm of rectum: Secondary | ICD-10-CM

## 2016-11-18 DIAGNOSIS — Z5111 Encounter for antineoplastic chemotherapy: Secondary | ICD-10-CM

## 2016-11-18 DIAGNOSIS — Z95828 Presence of other vascular implants and grafts: Secondary | ICD-10-CM

## 2016-11-18 DIAGNOSIS — R21 Rash and other nonspecific skin eruption: Secondary | ICD-10-CM | POA: Diagnosis not present

## 2016-11-18 DIAGNOSIS — Z5112 Encounter for antineoplastic immunotherapy: Secondary | ICD-10-CM

## 2016-11-18 LAB — CBC WITH DIFFERENTIAL/PLATELET
BASO%: 0.7 % (ref 0.0–2.0)
BASOS ABS: 0 10*3/uL (ref 0.0–0.1)
EOS ABS: 0.2 10*3/uL (ref 0.0–0.5)
EOS%: 3.9 % (ref 0.0–7.0)
HEMATOCRIT: 44.9 % (ref 38.4–49.9)
HGB: 14.9 g/dL (ref 13.0–17.1)
LYMPH#: 2.1 10*3/uL (ref 0.9–3.3)
LYMPH%: 48 % (ref 14.0–49.0)
MCH: 34.1 pg — ABNORMAL HIGH (ref 27.2–33.4)
MCHC: 33.1 g/dL (ref 32.0–36.0)
MCV: 102.9 fL — AB (ref 79.3–98.0)
MONO#: 0.4 10*3/uL (ref 0.1–0.9)
MONO%: 8.1 % (ref 0.0–14.0)
NEUT#: 1.7 10*3/uL (ref 1.5–6.5)
NEUT%: 39.3 % (ref 39.0–75.0)
PLATELETS: 168 10*3/uL (ref 140–400)
RBC: 4.36 10*6/uL (ref 4.20–5.82)
RDW: 16.6 % — ABNORMAL HIGH (ref 11.0–14.6)
WBC: 4.4 10*3/uL (ref 4.0–10.3)

## 2016-11-18 LAB — COMPREHENSIVE METABOLIC PANEL
ALT: 15 U/L (ref 0–55)
AST: 23 U/L (ref 5–34)
Albumin: 3.5 g/dL (ref 3.5–5.0)
Alkaline Phosphatase: 80 U/L (ref 40–150)
Anion Gap: 8 mEq/L (ref 3–11)
BUN: 14.7 mg/dL (ref 7.0–26.0)
CALCIUM: 9.4 mg/dL (ref 8.4–10.4)
CHLORIDE: 107 meq/L (ref 98–109)
CO2: 26 meq/L (ref 22–29)
CREATININE: 1.2 mg/dL (ref 0.7–1.3)
EGFR: 72 mL/min/{1.73_m2} — ABNORMAL LOW (ref >90)
GLUCOSE: 112 mg/dL (ref 70–140)
POTASSIUM: 4 meq/L (ref 3.5–5.1)
SODIUM: 142 meq/L (ref 136–145)
Total Bilirubin: 0.5 mg/dL (ref 0.20–1.20)
Total Protein: 7.6 g/dL (ref 6.4–8.3)

## 2016-11-18 LAB — MAGNESIUM: Magnesium: 1.9 mg/dl (ref 1.5–2.5)

## 2016-11-18 MED ORDER — ATROPINE SULFATE 1 MG/ML IJ SOLN
0.5000 mg | Freq: Once | INTRAMUSCULAR | Status: AC | PRN
  Administered 2016-11-18: 0.5 mg via INTRAVENOUS

## 2016-11-18 MED ORDER — PALONOSETRON HCL INJECTION 0.25 MG/5ML
0.2500 mg | Freq: Once | INTRAVENOUS | Status: AC
Start: 2016-11-18 — End: 2016-11-18
  Administered 2016-11-18: 0.25 mg via INTRAVENOUS

## 2016-11-18 MED ORDER — SODIUM CHLORIDE 0.9 % IV SOLN: Freq: Once | INTRAVENOUS | Status: AC

## 2016-11-18 MED ORDER — SODIUM CHLORIDE 0.9 % IJ SOLN
10.0000 mL | INTRAMUSCULAR | Status: DC | PRN
  Administered 2016-11-18: 10 mL via INTRAVENOUS
  Filled 2016-11-18: qty 10

## 2016-11-18 MED ORDER — ATROPINE SULFATE 1 MG/ML IJ SOLN
INTRAMUSCULAR | Status: AC
  Filled 2016-11-18: qty 1

## 2016-11-18 MED ORDER — PANITUMUMAB CHEMO INJECTION 100 MG/5ML
6.0000 mg/kg | Freq: Once | INTRAVENOUS | Status: AC
  Administered 2016-11-18: 500 mg via INTRAVENOUS
  Filled 2016-11-18: qty 20

## 2016-11-18 MED ORDER — FLUOROURACIL CHEMO INJECTION 2.5 GM/50ML
400.0000 mg/m2 | Freq: Once | INTRAVENOUS | Status: AC
  Administered 2016-11-18: 800 mg via INTRAVENOUS
  Filled 2016-11-18: qty 16

## 2016-11-18 MED ORDER — IRINOTECAN HCL CHEMO INJECTION 100 MG/5ML
185.0000 mg/m2 | Freq: Once | INTRAVENOUS | Status: AC
  Administered 2016-11-18: 380 mg via INTRAVENOUS
  Filled 2016-11-18: qty 15

## 2016-11-18 MED ORDER — DEXAMETHASONE SODIUM PHOSPHATE 10 MG/ML IJ SOLN
INTRAMUSCULAR | Status: AC
  Filled 2016-11-18: qty 1

## 2016-11-18 MED ORDER — AMLODIPINE BESYLATE 5 MG PO TABS: 5.0000 mg | ORAL_TABLET | Freq: Every day | ORAL | 2 refills | Status: DC

## 2016-11-18 MED ORDER — DEXTROSE 5 % IV SOLN
400.0000 mg/m2 | Freq: Once | INTRAVENOUS | Status: AC
  Administered 2016-11-18: 820 mg via INTRAVENOUS
  Filled 2016-11-18: qty 41

## 2016-11-18 MED ORDER — PROCHLORPERAZINE MALEATE 10 MG PO TABS: ORAL_TABLET | ORAL | 2 refills | Status: DC

## 2016-11-18 MED ORDER — SODIUM CHLORIDE 0.9 % IV SOLN
Freq: Once | INTRAVENOUS | Status: AC
  Administered 2016-11-18: 12:00:00 via INTRAVENOUS

## 2016-11-18 MED ORDER — SODIUM CHLORIDE 0.9 % IV SOLN
2450.0000 mg/m2 | INTRAVENOUS | Status: DC
  Administered 2016-11-18: 5000 mg via INTRAVENOUS
  Filled 2016-11-18: qty 100

## 2016-11-18 MED ORDER — PALONOSETRON HCL INJECTION 0.25 MG/5ML
INTRAVENOUS | Status: AC
Start: 2016-11-18 — End: 2016-11-18
  Filled 2016-11-18: qty 5

## 2016-11-18 MED ORDER — DEXAMETHASONE SODIUM PHOSPHATE 10 MG/ML IJ SOLN
10.0000 mg | Freq: Once | INTRAMUSCULAR | Status: AC
  Administered 2016-11-18: 10 mg via INTRAVENOUS

## 2016-11-18 NOTE — Progress Notes (Signed)
[place] OFFICE PROGRESS NOTE   Diagnosis: Rectal cancer  INTERVAL HISTORY:   [name] returns as scheduled. He completed another cycle of FOLFIRI/panitumumab on 11/04/2016. No nausea/vomiting or diarrhea following chemotherapy. He has developed dark areas over the tongue. The rash has progressed over the chest and face. The rash is nonpruritic. He is taking minocycline.  Objective:  Vital signs in last 24 hours:  Blood pressure 133/79, pulse 77, temperature 98.2 F (36.8 C), temperature source Oral, resp. rate 18, height 6' (1.829 m), weight 181 lb 8 oz (82.3 kg), SpO2 100 %.    HEENT: No thrush or ulcers. Hyperpigmentation at the buccal mucosa, palate, and tongue Resp: Lungs clear bilaterally Cardio: Regular rate and rhythm GI: No hepatosplenomegaly, nontender Vascular: No leg edema  Skin: Acne type rash over the upper chest, back, and face, dryness of the skin at the abdominal wall   Portacath/PICC-without erythema  Lab Results:  Lab Results  Component Value Date   WBC 4.4 11/18/2016   HGB 14.9 11/18/2016   HCT 44.9 11/18/2016   MCV 102.9 (H) 11/18/2016   PLT 168 11/18/2016   NEUTROABS 1.7 11/18/2016    CMP     Component Value Date/Time   NA 142 11/18/2016 0951   K 4.0 11/18/2016 0951   CL 109 07/30/2015 1844   CO2 26 11/18/2016 0951   GLUCOSE 112 11/18/2016 0951   BUN 14.7 11/18/2016 0951   CREATININE 1.2 11/18/2016 0951   CALCIUM 9.4 11/18/2016 0951   PROT 7.6 11/18/2016 0951   ALBUMIN 3.5 11/18/2016 0951   AST 23 11/18/2016 0951   ALT 15 11/18/2016 0951   ALKPHOS 80 11/18/2016 0951   BILITOT 0.50 11/18/2016 0951   GFRNONAA >60 07/30/2015 1844   GFRAA >60 07/30/2015 1844    Lab Results  Component Value Date   CEA1 3.24 09/30/2016     Medications: I have reviewed the patient's current medications.  Assessment/Plan: 1. Rectal cancer, invasive adenocarcinoma confirmed at colonoscopy 05/22/2015, EUS staging 06/01/2015  revealed a uT3,N1 lesion at 7 cm from the anal verge  Staging CT abdomen/pelvis 05/15/2015 confirmed a mass at the rectosigmoid colon, a suspicious perirectal lymph node, and a 6 mm right liver lesion felt to most likely be a cyst  CT chest 05/30/2015 with indeterminate pulmonary nodules and multiple liver lesions suspicious for metastases  Ultrasound 06/13/2014 with no liver lesions identified  MRI abdomen 06/21/2015 consistent with multiple liver metastases  CT biopsy of liver lesion 06/26/2015. Pathology showed metastatic adenocarcinoma consistent with colonic primary.MSI-stable, low mutation burden, no RAS or BRAFmutation  Cycle 1 FOLFOX 07/10/2015  Cycle 2 FOLFOX plus Avastin 07/24/2015 with Neulasta support  Cycle 3 FOLFOX plus Avastin 08/07/2015  Cycle 4 FOLFOX plus Avastin 08/21/2015  cycle 5 FOLFOX plus Avastin 09/04/2015  Restaging CTs 09/15/2015 revealed a decrease in the size of liver lesions, decreased rectal primary, stable/decreased size of tiny lung nodules  Cycle 6 FOLFOX plus Avastin 09/18/2015  Cycle 7 FOLFOX plus Avastin 10/02/2015  Cycle 8 FOLFOX plus Avastin 10/16/2015  Cycle 9 FOLFOX plus Avastin 11/06/2015  Cycle 10 FOLFOX plus Avastin 11/20/2015  CTs 12/07/2015-mild decrease in wall thickening at the rectum, decrease in tiny hepatic metastases, stable lung nodules, no evidence of progressive metastatic disease, new left lower lobe infiltrate  Treatment changed to every three-week Avastin with every other week Xeloda beginning 12/11/2015  Restaging CTs 04/12/2016-interval increase in size of adjacent right upper lobe pulmonary nodules and right lower lobe pulmonary nodule; unchanged  5 mm lesion right hepatic lobe; no additional visualized hepatic metastasis. Grossly unchanged wall thickening of the rectum.  Continuation of Xeloda every other week and every three-week AvastinXeloda placed on hold 05/06/2016 due to hand-foot syndrome  Xeloda resumed  05/28/2016 1500 mg every morning and 1000 mg every afternoon 7 days on/7 days off  CT 09/27/2016-enlargement of lung nodules and new liver lesions, stable rectal mass  Cycle 1 FOLFIRI/panitumumab 10/08/2016  Cycle 2 panitumumab 10/23/2016 (FOLFIRI held secondary to neutropenia)  Cycle 3 FOLFIRI/PANITUMUMAB 11/04/2016  Cycle 4 FOLFIRI/panitumumab 11/18/2016 2.Multiple colon polyps on the colonoscopy 05/22/2015, tubular villous adenoma and a tubular adenoma were removed 3.Anemia-likely secondary to rectal bleeding.  4.Family history of multiple cancers including breast and prostate cancer 5.Transient right arm numbness 06/20/2015 6. Port-A-Cath placement 07/06/2015 by Dr. Marcello Moores 7. Mild neutropenia secondary to chemotherapy following cycle 1 FOLFOX. Neulasta added with cycle 2. 8. Delayed nausea following chemotherapy-emend added with cycle 3 FOLFOX 9. Early oxaliplatin neuropathy 10. 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 Xeloda resumed at a reduced dose. 11. Bilateral hand weakness. Question related to previous oxaliplatin. Referred to neurology 06/17/2016. 12. Hypertension-amlodipine started 07/08/2016 13. Rash secondary to PANITUMUMAB. He continues minocycline. 14. Neutropenia following cycle 1 FOLFIRI/PANITUMUMAB.      Disposition:  [name] appears stable. He will contact us if the skin rash progresses and we will prescribed fluticasone cream to use in the most affected areas. He will complete a third treatment with FOLFIRI today. He has not received Neulasta secondary to lack of insurance approval. He knows to contact us for a fever.  He will return for an office visit and cycle 4 FOLFIRI on 12/02/2016.  15 minutes were spent with the patient today. The majority of the time was used for counseling and coordination of care.  Donneta Romberg, MD  11/18/2016  11:23 AM

## 2016-11-18 NOTE — Patient Instructions (Signed)
[facility] Discharge Instructions for Patients Receiving Chemotherapy  Today you received the following chemotherapy agents Vectibix, Irinotecan, Leucovorin and Adrucil   To help prevent nausea and vomiting after your treatment, we encourage you to take your nausea medication as directed. No Zofran for 3 days. Take Compazine instead.    If you develop nausea and vomiting that is not controlled by your nausea medication, call the clinic.   BELOW ARE SYMPTOMS THAT SHOULD BE REPORTED IMMEDIATELY:  *FEVER GREATER THAN 100.5 F  *CHILLS WITH OR WITHOUT FEVER  NAUSEA AND VOMITING THAT IS NOT CONTROLLED WITH YOUR NAUSEA MEDICATION  *UNUSUAL SHORTNESS OF BREATH  *UNUSUAL BRUISING OR BLEEDING  TENDERNESS IN MOUTH AND THROAT WITH OR WITHOUT PRESENCE OF ULCERS  *URINARY PROBLEMS  *BOWEL PROBLEMS  UNUSUAL RASH Items with * indicate a potential emergency and should be followed up as soon as possible.  Feel free to call the clinic you have any questions or concerns. The clinic phone number is [phone].  Please show the [place] at check-in to the Emergency Department and triage nurse.

## 2016-11-18 NOTE — Telephone Encounter (Signed)
Appointments scheduled per 11/18/16 los. 12/05/16 Injection cancelled per 11/18/16 los. Patient was given a copy of the AVS report and appointment schedule, per 11/18/16 los.

## 2016-11-20 ENCOUNTER — Ambulatory Visit (HOSPITAL_BASED_OUTPATIENT_CLINIC_OR_DEPARTMENT_OTHER): Payer: BLUE CROSS/BLUE SHIELD

## 2016-11-20 ENCOUNTER — Ambulatory Visit: Payer: BLUE CROSS/BLUE SHIELD

## 2016-11-20 VITALS — BP 127/86 | HR 74 | Temp 98.5°F | Resp 18

## 2016-11-20 DIAGNOSIS — C20 Malignant neoplasm of rectum: Secondary | ICD-10-CM

## 2016-11-20 DIAGNOSIS — Z452 Encounter for adjustment and management of vascular access device: Secondary | ICD-10-CM

## 2016-11-20 MED ORDER — SODIUM CHLORIDE 0.9% FLUSH
10.0000 mL | INTRAVENOUS | Status: DC | PRN
  Administered 2016-11-20: 10 mL
  Filled 2016-11-20: qty 10

## 2016-11-20 MED ORDER — HEPARIN SOD (PORK) LOCK FLUSH 100 UNIT/ML IV SOLN
500.0000 [IU] | Freq: Once | INTRAVENOUS | Status: AC | PRN
  Administered 2016-11-20: 500 [IU]
  Filled 2016-11-20: qty 5

## 2016-11-20 NOTE — Progress Notes (Signed)
Per MD note 11/18/16, no nuelasta at this time.

## 2016-11-20 NOTE — Patient Instructions (Signed)
Implanted Port Home Guide An implanted port is a type of central line that is placed under the skin. Central lines are used to provide IV access when treatment or nutrition needs to be given through a person's veins. Implanted ports are used for long-term IV access. An implanted port may be placed because:  You need IV medicine that would be irritating to the small veins in your hands or arms.  You need long-term IV medicines, such as antibiotics.  You need IV nutrition for a long period.  You need frequent blood draws for lab tests.  You need dialysis.  Implanted ports are usually placed in the chest area, but they can also be placed in the upper arm, the abdomen, or the leg. An implanted port has two main parts:  Reservoir. The reservoir is round and will appear as a small, raised area under your skin. The reservoir is the part where a needle is inserted to give medicines or draw blood.  Catheter. The catheter is a thin, flexible tube that extends from the reservoir. The catheter is placed into a large vein. Medicine that is inserted into the reservoir goes into the catheter and then into the vein.  How will I care for my incision site? Do not get the incision site wet. Bathe or shower as directed by your health care provider. How is my port accessed? Special steps must be taken to access the port:  Before the port is accessed, a numbing cream can be placed on the skin. This helps numb the skin over the port site.  Your health care provider uses a sterile technique to access the port. ? Your health care provider must put on a mask and sterile gloves. ? The skin over your port is cleaned carefully with an antiseptic and allowed to dry. ? The port is gently pinched between sterile gloves, and a needle is inserted into the port.  Only "non-coring" port needles should be used to access the port. Once the port is accessed, a blood return should be checked. This helps ensure that the port  is in the vein and is not clogged.  If your port needs to remain accessed for a constant infusion, a clear (transparent) bandage will be placed over the needle site. The bandage and needle will need to be changed every week, or as directed by your health care provider.  Keep the bandage covering the needle clean and dry. Do not get it wet. Follow your health care provider's instructions on how to take a shower or bath while the port is accessed.  If your port does not need to stay accessed, no bandage is needed over the port.  What is flushing? Flushing helps keep the port from getting clogged. Follow your health care provider's instructions on how and when to flush the port. Ports are usually flushed with saline solution or a medicine called heparin. The need for flushing will depend on how the port is used.  If the port is used for intermittent medicines or blood draws, the port will need to be flushed: ? After medicines have been given. ? After blood has been drawn. ? As part of routine maintenance.  If a constant infusion is running, the port may not need to be flushed.  How long will my port stay implanted? The port can stay in for as long as your health care provider thinks it is needed. When it is time for the port to come out, surgery will be   done to remove it. The procedure is similar to the one performed when the port was put in. When should I seek immediate medical care? When you have an implanted port, you should seek immediate medical care if:  You notice a bad smell coming from the incision site.  You have swelling, redness, or drainage at the incision site.  You have more swelling or pain at the port site or the surrounding area.  You have a fever that is not controlled with medicine.  This information is not intended to replace advice given to you by your health care provider. Make sure you discuss any questions you have with your health care provider. Document  Released: 05/20/2005 Document Revised: 10/26/2015 Document Reviewed: 01/25/2013 Elsevier Interactive Patient Education  2017 [employer]  

## 2016-12-01 ENCOUNTER — Other Ambulatory Visit: Payer: Self-pay | Admitting: Oncology

## 2016-12-02 ENCOUNTER — Other Ambulatory Visit (HOSPITAL_BASED_OUTPATIENT_CLINIC_OR_DEPARTMENT_OTHER): Payer: BLUE CROSS/BLUE SHIELD

## 2016-12-02 ENCOUNTER — Ambulatory Visit: Payer: BLUE CROSS/BLUE SHIELD

## 2016-12-02 ENCOUNTER — Ambulatory Visit (HOSPITAL_BASED_OUTPATIENT_CLINIC_OR_DEPARTMENT_OTHER): Payer: BLUE CROSS/BLUE SHIELD | Admitting: Nurse Practitioner

## 2016-12-02 ENCOUNTER — Telehealth: Payer: Self-pay | Admitting: Nurse Practitioner

## 2016-12-02 VITALS — BP 128/76 | HR 76 | Temp 97.8°F | Resp 20 | Ht 72.0 in | Wt 185.1 lb

## 2016-12-02 DIAGNOSIS — D701 Agranulocytosis secondary to cancer chemotherapy: Secondary | ICD-10-CM | POA: Diagnosis not present

## 2016-12-02 DIAGNOSIS — R21 Rash and other nonspecific skin eruption: Secondary | ICD-10-CM | POA: Diagnosis not present

## 2016-12-02 DIAGNOSIS — G620 Drug-induced polyneuropathy: Secondary | ICD-10-CM

## 2016-12-02 DIAGNOSIS — C20 Malignant neoplasm of rectum: Secondary | ICD-10-CM | POA: Diagnosis not present

## 2016-12-02 DIAGNOSIS — Z95828 Presence of other vascular implants and grafts: Secondary | ICD-10-CM

## 2016-12-02 LAB — COMPREHENSIVE METABOLIC PANEL
ALT: 15 U/L (ref 0–55)
ANION GAP: 11 meq/L (ref 3–11)
AST: 20 U/L (ref 5–34)
Albumin: 3.3 g/dL — ABNORMAL LOW (ref 3.5–5.0)
Alkaline Phosphatase: 77 U/L (ref 40–150)
BILIRUBIN TOTAL: 0.46 mg/dL (ref 0.20–1.20)
BUN: 13.5 mg/dL (ref 7.0–26.0)
CHLORIDE: 108 meq/L (ref 98–109)
CO2: 24 meq/L (ref 22–29)
Calcium: 9.1 mg/dL (ref 8.4–10.4)
Creatinine: 1.2 mg/dL (ref 0.7–1.3)
EGFR: 76 mL/min/{1.73_m2} — AB (ref >90)
Glucose: 107 mg/dl (ref 70–140)
POTASSIUM: 3.8 meq/L (ref 3.5–5.1)
SODIUM: 143 meq/L (ref 136–145)
Total Protein: 7.1 g/dL (ref 6.4–8.3)

## 2016-12-02 LAB — CBC WITH DIFFERENTIAL/PLATELET
BASO%: 1.1 % (ref 0.0–2.0)
Basophils Absolute: 0 10*3/uL (ref 0.0–0.1)
EOS ABS: 0.1 10*3/uL (ref 0.0–0.5)
EOS%: 2.4 % (ref 0.0–7.0)
HCT: 41.1 % (ref 38.4–49.9)
HGB: 13.7 g/dL (ref 13.0–17.1)
LYMPH%: 59.4 % — AB (ref 14.0–49.0)
MCH: 34.3 pg — AB (ref 27.2–33.4)
MCHC: 33.2 g/dL (ref 32.0–36.0)
MCV: 103.1 fL — AB (ref 79.3–98.0)
MONO#: 0.3 10*3/uL (ref 0.1–0.9)
MONO%: 11.9 % (ref 0.0–14.0)
NEUT#: 0.7 10*3/uL — ABNORMAL LOW (ref 1.5–6.5)
NEUT%: 25.2 % — AB (ref 39.0–75.0)
PLATELETS: 146 10*3/uL (ref 140–400)
RBC: 3.99 10*6/uL — AB (ref 4.20–5.82)
RDW: 16.6 % — ABNORMAL HIGH (ref 11.0–14.6)
WBC: 2.7 10*3/uL — ABNORMAL LOW (ref 4.0–10.3)
lymph#: 1.6 10*3/uL (ref 0.9–3.3)

## 2016-12-02 LAB — MAGNESIUM: Magnesium: 1.6 mg/dl (ref 1.5–2.5)

## 2016-12-02 LAB — CEA (IN HOUSE-CHCC)

## 2016-12-02 MED ORDER — PANTOPRAZOLE SODIUM 40 MG PO TBEC: 40.0000 mg | DELAYED_RELEASE_TABLET | Freq: Every day | ORAL | 3 refills | Status: DC

## 2016-12-02 MED ORDER — SODIUM CHLORIDE 0.9 % IJ SOLN
10.0000 mL | INTRAMUSCULAR | Status: DC | PRN
  Administered 2016-12-02: 10 mL via INTRAVENOUS
  Filled 2016-12-02: qty 10

## 2016-12-02 MED ORDER — HEPARIN SOD (PORK) LOCK FLUSH 100 UNIT/ML IV SOLN
500.0000 [IU] | Freq: Once | INTRAVENOUS | Status: AC | PRN
  Administered 2016-12-02: 500 [IU] via INTRAVENOUS
  Filled 2016-12-02: qty 5

## 2016-12-02 MED ORDER — SODIUM CHLORIDE 0.9 % IJ SOLN
10.0000 mL | INTRAMUSCULAR | Status: DC | PRN
Start: 2016-12-02 — End: 2016-12-02
  Administered 2016-12-02: 10 mL via INTRAVENOUS
  Filled 2016-12-02: qty 10

## 2016-12-02 NOTE — Progress Notes (Addendum)
[place] OFFICE PROGRESS NOTE   Diagnosis:  Rectal cancer  INTERVAL HISTORY:   Hunter Walker returns as scheduled. He completed cycle 4 FOLFIRI/PANITUMUMAB 11/18/2016. He denies nausea/vomiting. No mouth sores. No diarrhea. Skin rash is improved in some areas. He continues minocycline. No hand or foot pain or redness. Neuropathy symptoms are better.  Objective:  Vital signs in last 24 hours:  Blood pressure 128/76, pulse 76, temperature 97.8 F (36.6 C), temperature source Oral, resp. rate 20, height 6' (1.829 m), weight 185 lb 1.6 oz (84 kg), SpO2 100 %.    HEENT: No thrush or ulcers. Resp: Lungs clear bilaterally. Cardio: Regular rate and rhythm. GI: Abdomen soft and nontender. No hepatomegaly. Vascular: No leg edema. Skin: Skin in general has a dry appearance. An acne type rash is scattered over the face and trunk. Port-A-Cath without erythema.  Lab Results:  Lab Results  Component Value Date   WBC 2.7 (L) 12/02/2016   HGB 13.7 12/02/2016   HCT 41.1 12/02/2016   MCV 103.1 (H) 12/02/2016   PLT 146 12/02/2016   NEUTROABS 0.7 (L) 12/02/2016    Imaging:  No results found.  Medications: I have reviewed the patient's current medications.  Assessment/Plan: 1. Rectal cancer, invasive adenocarcinoma confirmed at colonoscopy 05/22/2015, EUS staging 06/01/2015 revealed a uT3,N1 lesion at 7 cm from the anal verge  Staging CT abdomen/pelvis 05/15/2015 confirmed a mass at the rectosigmoid colon, a suspicious perirectal lymph node, and a 6 mm right liver lesion felt to most likely be a cyst  CT chest 05/30/2015 with indeterminate pulmonary nodules and multiple liver lesions suspicious for metastases  Ultrasound 06/13/2014 with no liver lesions identified  MRI abdomen 06/21/2015 consistent with multiple liver metastases  CT biopsy of liver lesion 06/26/2015. Pathology showed metastatic adenocarcinoma consistent with colonic primary.MSI-stable, low mutation  burden, no RAS or BRAFmutation  Cycle 1 FOLFOX 07/10/2015  Cycle 2 FOLFOX plus Avastin 07/24/2015 with Neulasta support  Cycle 3 FOLFOX plus Avastin 08/07/2015  Cycle 4 FOLFOX plus Avastin 08/21/2015  cycle 5 FOLFOX plus Avastin 09/04/2015  Restaging CTs 09/15/2015 revealed a decrease in the size of liver lesions, decreased rectal primary, stable/decreased size of tiny lung nodules  Cycle 6 FOLFOX plus Avastin 09/18/2015  Cycle 7 FOLFOX plus Avastin 10/02/2015  Cycle 8 FOLFOX plus Avastin 10/16/2015  Cycle 9 FOLFOX plus Avastin 11/06/2015  Cycle 10 FOLFOX plus Avastin 11/20/2015  CTs 12/07/2015-mild decrease in wall thickening at the rectum, decrease in tiny hepatic metastases, stable lung nodules, no evidence of progressive metastatic disease, new left lower lobe infiltrate  Treatment changed to every three-week Avastin with every other week Xeloda beginning 12/11/2015  Restaging CTs 04/12/2016-interval increase in size of adjacent right upper lobe pulmonary nodules and right lower lobe pulmonary nodule; unchanged 5 mm lesion right hepatic lobe; no additional visualized hepatic metastasis. Grossly unchanged wall thickening of the rectum.  Continuation of Xeloda every other week and every three-week AvastinXeloda placed on hold 05/06/2016 due to hand-foot syndrome  Xeloda resumed 05/28/2016 1500 mg every morning and 1000 mg every afternoon 7 days on/7 days off  CT 09/27/2016-enlargement of lung nodules and new liver lesions, stable rectal mass  Cycle 1 FOLFIRI/panitumumab 10/08/2016  Cycle 2 panitumumab 10/23/2016 (FOLFIRI held secondary to neutropenia)  Cycle 3 FOLFIRI/PANITUMUMAB 11/04/2016  Cycle 4 FOLFIRI/panitumumab 11/18/2016 2.Multiple colon polyps on the colonoscopy 05/22/2015, tubular villous adenoma and a tubular adenoma were removed 3.Anemia-likely secondary to rectal bleeding.  4.Family history of multiple cancers including breast and prostate  cancer 5.Transient right arm numbness 06/20/2015 6. Port-A-Cath placement 07/06/2015 by Dr. Marcello Moores 7. Mild neutropenia secondary to chemotherapy following cycle 1 FOLFOX. Neulasta added with cycle 2. 8. Delayed nausea following chemotherapy-emend added with cycle 3 FOLFOX 9. Early oxaliplatin neuropathy 10. 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 Xeloda resumed at a reduced dose. 11. Bilateral hand weakness. Question related to previous oxaliplatin. Referred to neurology 06/17/2016. 12. Hypertension-amlodipine started 07/08/2016 13. Rash secondary to PANITUMUMAB. He continues minocycline. 14. Neutropenia following cycle 1 FOLFIRI/PANITUMUMAB; neutropenia following cycle 4 FOLFIRI/PANITUMUMAB.    Disposition: Hunter Walker appears stable. He has completed 4 cycles of FOLFIRI/PANITUMUMAB. He is neutropenic on labs today. Treatment will be held and rescheduled for 1 week with plans to dose reduce the irinotecan if we remain unable to give Neulasta due to lack of insurance approval. We reviewed neutropenic precautions. He understands to contact the office with fever, chills, other signs of infection.  We will see him in follow-up on 12/23/2016. He will contact the office in the interim as outlined above or with any other problems.  Patient seen with Dr. Benay Spice.    Ned Card ANP/GNP-BC   12/02/2016  8:54 AM  This was a shared visit with Ned Card. Chemotherapy will be held today secondary to neutropenia. His insurance company will not approve Neulasta. The irinotecan will be dose reduced with the next cycle of chemotherapy.  Julieanne Manson, M.D.

## 2016-12-02 NOTE — Telephone Encounter (Signed)
Scheduled appt per 7/2 los - Gave patient AVS and calender per los.  

## 2016-12-02 NOTE — Patient Instructions (Signed)

## 2016-12-03 ENCOUNTER — Other Ambulatory Visit: Payer: Self-pay | Admitting: Internal Medicine

## 2016-12-05 ENCOUNTER — Ambulatory Visit: Payer: BLUE CROSS/BLUE SHIELD

## 2016-12-09 ENCOUNTER — Ambulatory Visit (HOSPITAL_BASED_OUTPATIENT_CLINIC_OR_DEPARTMENT_OTHER): Payer: BLUE CROSS/BLUE SHIELD

## 2016-12-09 ENCOUNTER — Other Ambulatory Visit (HOSPITAL_BASED_OUTPATIENT_CLINIC_OR_DEPARTMENT_OTHER): Payer: BLUE CROSS/BLUE SHIELD

## 2016-12-09 ENCOUNTER — Telehealth: Payer: Self-pay | Admitting: Oncology

## 2016-12-09 VITALS — BP 106/70 | HR 69 | Temp 98.2°F | Resp 18

## 2016-12-09 DIAGNOSIS — C20 Malignant neoplasm of rectum: Secondary | ICD-10-CM | POA: Diagnosis not present

## 2016-12-09 DIAGNOSIS — Z95828 Presence of other vascular implants and grafts: Secondary | ICD-10-CM

## 2016-12-09 LAB — CBC WITH DIFFERENTIAL/PLATELET
BASO%: 0.2 % (ref 0.0–2.0)
BASOS ABS: 0 10*3/uL (ref 0.0–0.1)
EOS ABS: 0.1 10*3/uL (ref 0.0–0.5)
EOS%: 3.2 % (ref 0.0–7.0)
HEMATOCRIT: 42.8 % (ref 38.4–49.9)
HEMOGLOBIN: 14.1 g/dL (ref 13.0–17.1)
LYMPH#: 2.5 10*3/uL (ref 0.9–3.3)
LYMPH%: 60.5 % — ABNORMAL HIGH (ref 14.0–49.0)
MCH: 33.8 pg — AB (ref 27.2–33.4)
MCHC: 32.9 g/dL (ref 32.0–36.0)
MCV: 102.6 fL — AB (ref 79.3–98.0)
MONO#: 0.5 10*3/uL (ref 0.1–0.9)
MONO%: 12 % (ref 0.0–14.0)
NEUT#: 1 10*3/uL — ABNORMAL LOW (ref 1.5–6.5)
NEUT%: 24.1 % — ABNORMAL LOW (ref 39.0–75.0)
Platelets: 180 10*3/uL (ref 140–400)
RBC: 4.17 10*6/uL — ABNORMAL LOW (ref 4.20–5.82)
RDW: 16.6 % — AB (ref 11.0–14.6)
WBC: 4.1 10*3/uL (ref 4.0–10.3)

## 2016-12-09 LAB — COMPREHENSIVE METABOLIC PANEL
ALBUMIN: 3.5 g/dL (ref 3.5–5.0)
ALK PHOS: 87 U/L (ref 40–150)
ALT: 25 U/L (ref 0–55)
AST: 31 U/L (ref 5–34)
Anion Gap: 10 mEq/L (ref 3–11)
BUN: 13.4 mg/dL (ref 7.0–26.0)
CALCIUM: 9.5 mg/dL (ref 8.4–10.4)
CO2: 24 mEq/L (ref 22–29)
Chloride: 107 mEq/L (ref 98–109)
Creatinine: 1.2 mg/dL (ref 0.7–1.3)
EGFR: 73 mL/min/{1.73_m2} — ABNORMAL LOW (ref >90)
Glucose: 108 mg/dl (ref 70–140)
POTASSIUM: 4 meq/L (ref 3.5–5.1)
Sodium: 141 mEq/L (ref 136–145)
Total Bilirubin: 0.45 mg/dL (ref 0.20–1.20)
Total Protein: 7.4 g/dL (ref 6.4–8.3)

## 2016-12-09 LAB — MAGNESIUM: MAGNESIUM: 2 mg/dL (ref 1.5–2.5)

## 2016-12-09 MED ORDER — HEPARIN SOD (PORK) LOCK FLUSH 100 UNIT/ML IV SOLN
500.0000 [IU] | Freq: Once | INTRAVENOUS | Status: AC | PRN
  Administered 2016-12-09: 500 [IU] via INTRAVENOUS
  Filled 2016-12-09: qty 5

## 2016-12-09 MED ORDER — SODIUM CHLORIDE 0.9 % IJ SOLN
10.0000 mL | INTRAMUSCULAR | Status: DC | PRN
  Administered 2016-12-09: 10 mL via INTRAVENOUS
  Filled 2016-12-09: qty 10

## 2016-12-09 NOTE — Telephone Encounter (Signed)
lvm to inform pt of 7/16 appt per sch msg

## 2016-12-09 NOTE — Patient Instructions (Signed)
Neutropenia Neutropenia is a condition that occurs when you have a lower-than-normal level of a type of white blood cell (neutrophil) in your body. Neutrophils are made in the spongy center of large bones (bone marrow) and they fight infections. Neutrophils are your body's main defense against bacterial and fungal infections. The fewer neutrophils you have and the longer your body remains without them, the greater your risk of getting a severe infection. What are the causes? This condition can occur if your body uses up or destroys neutrophils faster than your bone marrow can make them. This problem may happen because of:  Bacterial or fungal infection.  Allergic disorders.  Reactions to some medicines.  Autoimmune disease.  An enlarged spleen.  This condition can also occur if your bone marrow does not produce enough neutrophils. This problem may be caused by:  Cancer.  Cancer treatments, such as radiation or chemotherapy.  Viral infections.  Medicines, such as phenytoin.  Vitamin B12 deficiency.  Diseases of the bone marrow.  Environmental toxins, such as insecticides.  What are the signs or symptoms? This condition does not usually cause symptoms. If symptoms are present, they are usually caused by an underlying infection. Symptoms of an infection may include:  Fever.  Chills.  Swollen glands.  Oral or anal ulcers.  Cough and shortness of breath.  Rash.  Skin infection.  Fatigue.  How is this diagnosed? Your health care provider may suspect neutropenia if you have:  A condition that may cause neutropenia.  Symptoms of infection, especially fever.  Frequent and unusual infections.  You will have a medical history and physical exam. Tests will also be done, such as:  A complete blood count (CBC).  A procedure to collect a sample of bone marrow for examination (bone marrow biopsy).  A chest X-ray.  A urine culture.  A blood culture.  How is this  treated? Treatment depends on the underlying cause and severity of your condition. Mild neutropenia may not require treatment. Treatment may include medicines, such as:  Antibiotic medicine given through an IV tube.  Antiviral medicines.  Antifungal medicines.  A medicine to increase neutrophil production (colony-stimulating factor). You may get this drug through an IV tube or by injection.  Steroids given through an IV tube.  If an underlying condition is causing neutropenia, you may need treatment for that condition. If medicines you are taking are causing neutropenia, your health care provider may have you stop taking those medicines. Follow these instructions at home: Medicines  Take over-the-counter and prescription medicines only as told by your health care provider.  Get a seasonal flu shot (influenza vaccine). Lifestyle  Do not eat unpasteurized foods.Do not eat unwashed raw fruits or vegetables.  Avoid exposure to groups of people or children.  Avoid being around people who are sick.  Avoid being around dirt or dust, such as in construction areas or gardens.  Do not provide direct care for pets. Avoid animal droppings. Do not clean litter boxes and bird cages. Hygiene   Bathe daily.  Clean the area between the genitals and the anus (perineal area) after you urinate or have a bowel movement. If you are male, wipe from front to back.  Brush your teeth with a soft toothbrush before and after meals.  Do not use a razor that has a blade. Use an electric razor to remove hair.  Wash your hands often. Make sure others who come in contact with you also wash their hands. If soap and water  are not available, use hand sanitizer. General instructions  Do not have sex unless your health care provider has approved.  Take actions to avoid cuts and burns. For example: ? Be cautious when you use knives. Always cut away from yourself. ? Keep knives in protective sheaths or  guards when not in use. ? Use oven mitts when you cook with a hot stove, oven, or grill. ? Stand a safe distance away from open fires.  Avoid people who received a vaccine in the past 30 days if that vaccine contained a live version of the germ (live vaccine). You should not get a live vaccine. Common live vaccines are varicella, measles, mumps, and rubella.  Do not share food utensils.  Do not use tampons, enemas, or rectal suppositories unless your health care provider has approved.  Keep all appointments as told by your health care provider. This is important. Contact a health care provider if:  You have a fever.  You have chills or you start to shake.  You have: ? A sore throat. ? A warm, red, or tender area on your skin. ? A cough. ? Frequent or painful urination. ? Vaginal discharge or itching.  You develop: ? Sores in your mouth or anus. ? Swollen lymph nodes. ? Red streaks on the skin. ? A rash.  You feel: ? Nauseous or you vomit. ? Very fatigued. ? Short of breath. This information is not intended to replace advice given to you by your health care provider. Make sure you discuss any questions you have with your health care provider. Document Released: 11/09/2001 Document Revised: 10/26/2015 Document Reviewed: 11/30/2014 Elsevier Interactive Patient Education  2018 [employer]  

## 2016-12-09 NOTE — Progress Notes (Signed)
Called Dr. Benay Spice with Neutrophils of 1., treatment canceled.

## 2016-12-11 ENCOUNTER — Other Ambulatory Visit: Payer: BLUE CROSS/BLUE SHIELD

## 2016-12-16 ENCOUNTER — Ambulatory Visit: Payer: BLUE CROSS/BLUE SHIELD | Admitting: Nurse Practitioner

## 2016-12-16 ENCOUNTER — Ambulatory Visit: Payer: BLUE CROSS/BLUE SHIELD

## 2016-12-16 ENCOUNTER — Other Ambulatory Visit: Payer: BLUE CROSS/BLUE SHIELD

## 2016-12-16 ENCOUNTER — Ambulatory Visit (HOSPITAL_BASED_OUTPATIENT_CLINIC_OR_DEPARTMENT_OTHER): Payer: BLUE CROSS/BLUE SHIELD

## 2016-12-16 ENCOUNTER — Other Ambulatory Visit (HOSPITAL_BASED_OUTPATIENT_CLINIC_OR_DEPARTMENT_OTHER): Payer: BLUE CROSS/BLUE SHIELD

## 2016-12-16 VITALS — BP 111/74 | HR 68 | Temp 97.8°F

## 2016-12-16 DIAGNOSIS — Z5111 Encounter for antineoplastic chemotherapy: Secondary | ICD-10-CM | POA: Diagnosis not present

## 2016-12-16 DIAGNOSIS — C20 Malignant neoplasm of rectum: Secondary | ICD-10-CM

## 2016-12-16 DIAGNOSIS — Z5112 Encounter for antineoplastic immunotherapy: Secondary | ICD-10-CM

## 2016-12-16 LAB — COMPREHENSIVE METABOLIC PANEL
ALT: 19 U/L (ref 0–55)
ANION GAP: 8 meq/L (ref 3–11)
AST: 26 U/L (ref 5–34)
Albumin: 3.5 g/dL (ref 3.5–5.0)
Alkaline Phosphatase: 84 U/L (ref 40–150)
BUN: 10.9 mg/dL (ref 7.0–26.0)
CHLORIDE: 108 meq/L (ref 98–109)
CO2: 26 meq/L (ref 22–29)
CREATININE: 1.2 mg/dL (ref 0.7–1.3)
Calcium: 9.2 mg/dL (ref 8.4–10.4)
EGFR: 75 mL/min/{1.73_m2} — ABNORMAL LOW (ref >90)
GLUCOSE: 86 mg/dL (ref 70–140)
Potassium: 4.2 mEq/L (ref 3.5–5.1)
SODIUM: 142 meq/L (ref 136–145)
Total Bilirubin: 0.57 mg/dL (ref 0.20–1.20)
Total Protein: 7.2 g/dL (ref 6.4–8.3)

## 2016-12-16 LAB — CBC WITH DIFFERENTIAL/PLATELET
BASO%: 1 % (ref 0.0–2.0)
Basophils Absolute: 0.1 10*3/uL (ref 0.0–0.1)
EOS%: 1.4 % (ref 0.0–7.0)
Eosinophils Absolute: 0.1 10*3/uL (ref 0.0–0.5)
HCT: 42.7 % (ref 38.4–49.9)
HGB: 14 g/dL (ref 13.0–17.1)
LYMPH%: 40.5 % (ref 14.0–49.0)
MCH: 34.1 pg — ABNORMAL HIGH (ref 27.2–33.4)
MCHC: 32.8 g/dL (ref 32.0–36.0)
MCV: 103.9 fL — ABNORMAL HIGH (ref 79.3–98.0)
MONO#: 0.5 10*3/uL (ref 0.1–0.9)
MONO%: 10 % (ref 0.0–14.0)
NEUT#: 2.4 10*3/uL (ref 1.5–6.5)
NEUT%: 47.1 % (ref 39.0–75.0)
PLATELETS: 155 10*3/uL (ref 140–400)
RBC: 4.11 10*6/uL — AB (ref 4.20–5.82)
RDW: 16.8 % — ABNORMAL HIGH (ref 11.0–14.6)
WBC: 5.1 10*3/uL (ref 4.0–10.3)
lymph#: 2.1 10*3/uL (ref 0.9–3.3)

## 2016-12-16 LAB — MAGNESIUM: Magnesium: 1.7 mg/dl (ref 1.5–2.5)

## 2016-12-16 MED ORDER — ATROPINE SULFATE 1 MG/ML IJ SOLN
INTRAMUSCULAR | Status: AC
  Filled 2016-12-16: qty 1

## 2016-12-16 MED ORDER — SODIUM CHLORIDE 0.9 % IV SOLN
Freq: Once | INTRAVENOUS | Status: AC
  Administered 2016-12-16: 09:00:00 via INTRAVENOUS

## 2016-12-16 MED ORDER — ATROPINE SULFATE 1 MG/ML IJ SOLN
0.5000 mg | Freq: Once | INTRAMUSCULAR | Status: AC | PRN
  Administered 2016-12-16: 0.5 mg via INTRAVENOUS

## 2016-12-16 MED ORDER — FLUOROURACIL CHEMO INJECTION 2.5 GM/50ML
300.0000 mg/m2 | Freq: Once | INTRAVENOUS | Status: AC
  Administered 2016-12-16: 600 mg via INTRAVENOUS
  Filled 2016-12-16: qty 12

## 2016-12-16 MED ORDER — LEUCOVORIN CALCIUM INJECTION 350 MG
300.0000 mg/m2 | Freq: Once | INTRAMUSCULAR | Status: AC
  Administered 2016-12-16: 616 mg via INTRAVENOUS
  Filled 2016-12-16: qty 30.8

## 2016-12-16 MED ORDER — DEXAMETHASONE SODIUM PHOSPHATE 10 MG/ML IJ SOLN
10.0000 mg | Freq: Once | INTRAMUSCULAR | Status: AC
  Administered 2016-12-16: 10 mg via INTRAVENOUS

## 2016-12-16 MED ORDER — PANITUMUMAB CHEMO INJECTION 100 MG/5ML
6.0000 mg/kg | Freq: Once | INTRAVENOUS | Status: AC
  Administered 2016-12-16: 500 mg via INTRAVENOUS
  Filled 2016-12-16: qty 5

## 2016-12-16 MED ORDER — PALONOSETRON HCL INJECTION 0.25 MG/5ML
INTRAVENOUS | Status: AC
  Filled 2016-12-16: qty 5

## 2016-12-16 MED ORDER — DEXAMETHASONE SODIUM PHOSPHATE 10 MG/ML IJ SOLN
INTRAMUSCULAR | Status: AC
  Filled 2016-12-16: qty 1

## 2016-12-16 MED ORDER — PALONOSETRON HCL INJECTION 0.25 MG/5ML
0.2500 mg | Freq: Once | INTRAVENOUS | Status: AC
  Administered 2016-12-16: 0.25 mg via INTRAVENOUS

## 2016-12-16 MED ORDER — IRINOTECAN HCL CHEMO INJECTION 100 MG/5ML
125.0000 mg/m2 | Freq: Once | INTRAVENOUS | Status: AC
  Administered 2016-12-16: 260 mg via INTRAVENOUS
  Filled 2016-12-16: qty 5

## 2016-12-16 MED ORDER — SODIUM CHLORIDE 0.9 % IV SOLN
2400.0000 mg/m2 | INTRAVENOUS | Status: DC
  Administered 2016-12-16: 4900 mg via INTRAVENOUS
  Filled 2016-12-16: qty 98

## 2016-12-16 NOTE — Patient Instructions (Signed)
[facility] Discharge Instructions for Patients Receiving Chemotherapy  Today you received the following chemotherapy agents Vectibix, Irinotecan, Leucovorin and Adrucil   To help prevent nausea and vomiting after your treatment, we encourage you to take your nausea medication as directed. No Zofran for 3 days. Take Compazine instead.    If you develop nausea and vomiting that is not controlled by your nausea medication, call the clinic.   BELOW ARE SYMPTOMS THAT SHOULD BE REPORTED IMMEDIATELY:  *FEVER GREATER THAN 100.5 F  *CHILLS WITH OR WITHOUT FEVER  NAUSEA AND VOMITING THAT IS NOT CONTROLLED WITH YOUR NAUSEA MEDICATION  *UNUSUAL SHORTNESS OF BREATH  *UNUSUAL BRUISING OR BLEEDING  TENDERNESS IN MOUTH AND THROAT WITH OR WITHOUT PRESENCE OF ULCERS  *URINARY PROBLEMS  *BOWEL PROBLEMS  UNUSUAL RASH Items with * indicate a potential emergency and should be followed up as soon as possible.  Feel free to call the clinic you have any questions or concerns. The clinic phone number is [phone].  Please show the [place] at check-in to the Emergency Department and triage nurse.

## 2016-12-18 ENCOUNTER — Ambulatory Visit (HOSPITAL_BASED_OUTPATIENT_CLINIC_OR_DEPARTMENT_OTHER): Payer: BLUE CROSS/BLUE SHIELD

## 2016-12-18 DIAGNOSIS — Z452 Encounter for adjustment and management of vascular access device: Secondary | ICD-10-CM | POA: Diagnosis not present

## 2016-12-18 DIAGNOSIS — Z95828 Presence of other vascular implants and grafts: Secondary | ICD-10-CM

## 2016-12-18 DIAGNOSIS — C20 Malignant neoplasm of rectum: Secondary | ICD-10-CM

## 2016-12-18 MED ORDER — HEPARIN SOD (PORK) LOCK FLUSH 100 UNIT/ML IV SOLN
500.0000 [IU] | Freq: Once | INTRAVENOUS | Status: AC | PRN
  Administered 2016-12-18: 500 [IU] via INTRAVENOUS
  Filled 2016-12-18: qty 5

## 2016-12-18 MED ORDER — SODIUM CHLORIDE 0.9 % IJ SOLN
10.0000 mL | INTRAMUSCULAR | Status: DC | PRN
  Administered 2016-12-18: 10 mL via INTRAVENOUS
  Filled 2016-12-18: qty 10

## 2016-12-18 NOTE — Patient Instructions (Signed)

## 2016-12-19 ENCOUNTER — Telehealth: Payer: Self-pay | Admitting: Oncology

## 2016-12-19 NOTE — Telephone Encounter (Signed)
Patient called to cancel his appointment on Monday 7/23 he had his infusion on 7/16 and needs to be scheduled for 7/30

## 2016-12-19 NOTE — Telephone Encounter (Signed)
sw pt to confirm r/s 7/23 appt to 7/31 at 0800 per sch msg. Unable to sch 7/30 as requested due to no availability in infusion on this date

## 2016-12-23 ENCOUNTER — Other Ambulatory Visit: Payer: BLUE CROSS/BLUE SHIELD

## 2016-12-23 ENCOUNTER — Ambulatory Visit: Payer: BLUE CROSS/BLUE SHIELD | Admitting: Oncology

## 2016-12-23 ENCOUNTER — Ambulatory Visit: Payer: BLUE CROSS/BLUE SHIELD | Admitting: Internal Medicine

## 2016-12-23 ENCOUNTER — Ambulatory Visit: Payer: BLUE CROSS/BLUE SHIELD

## 2016-12-29 ENCOUNTER — Other Ambulatory Visit: Payer: Self-pay | Admitting: Oncology

## 2016-12-31 ENCOUNTER — Ambulatory Visit (HOSPITAL_BASED_OUTPATIENT_CLINIC_OR_DEPARTMENT_OTHER): Payer: BLUE CROSS/BLUE SHIELD | Admitting: Oncology

## 2016-12-31 ENCOUNTER — Telehealth: Payer: Self-pay

## 2016-12-31 ENCOUNTER — Other Ambulatory Visit: Payer: Self-pay

## 2016-12-31 ENCOUNTER — Ambulatory Visit (HOSPITAL_BASED_OUTPATIENT_CLINIC_OR_DEPARTMENT_OTHER): Payer: BLUE CROSS/BLUE SHIELD

## 2016-12-31 ENCOUNTER — Other Ambulatory Visit (HOSPITAL_BASED_OUTPATIENT_CLINIC_OR_DEPARTMENT_OTHER): Payer: BLUE CROSS/BLUE SHIELD

## 2016-12-31 VITALS — BP 123/86 | HR 72 | Temp 97.7°F | Resp 18 | Ht 72.0 in | Wt 184.7 lb

## 2016-12-31 DIAGNOSIS — R21 Rash and other nonspecific skin eruption: Secondary | ICD-10-CM | POA: Diagnosis not present

## 2016-12-31 DIAGNOSIS — C20 Malignant neoplasm of rectum: Secondary | ICD-10-CM

## 2016-12-31 DIAGNOSIS — Z5111 Encounter for antineoplastic chemotherapy: Secondary | ICD-10-CM | POA: Diagnosis not present

## 2016-12-31 DIAGNOSIS — Z5112 Encounter for antineoplastic immunotherapy: Secondary | ICD-10-CM

## 2016-12-31 DIAGNOSIS — G620 Drug-induced polyneuropathy: Secondary | ICD-10-CM

## 2016-12-31 DIAGNOSIS — Z95828 Presence of other vascular implants and grafts: Secondary | ICD-10-CM

## 2016-12-31 LAB — CBC WITH DIFFERENTIAL/PLATELET
BASO%: 0.6 % (ref 0.0–2.0)
BASOS ABS: 0 10*3/uL (ref 0.0–0.1)
EOS%: 4.7 % (ref 0.0–7.0)
Eosinophils Absolute: 0.2 10*3/uL (ref 0.0–0.5)
HEMATOCRIT: 44 % (ref 38.4–49.9)
HEMOGLOBIN: 14.4 g/dL (ref 13.0–17.1)
LYMPH#: 1.4 10*3/uL (ref 0.9–3.3)
LYMPH%: 35.1 % (ref 14.0–49.0)
MCH: 33.3 pg (ref 27.2–33.4)
MCHC: 32.6 g/dL (ref 32.0–36.0)
MCV: 102.3 fL — ABNORMAL HIGH (ref 79.3–98.0)
MONO#: 0.4 10*3/uL (ref 0.1–0.9)
MONO%: 9.8 % (ref 0.0–14.0)
NEUT#: 2.1 10*3/uL (ref 1.5–6.5)
NEUT%: 49.8 % (ref 39.0–75.0)
Platelets: 187 10*3/uL (ref 140–400)
RBC: 4.3 10*6/uL (ref 4.20–5.82)
RDW: 16.9 % — AB (ref 11.0–14.6)
WBC: 4.1 10*3/uL (ref 4.0–10.3)

## 2016-12-31 LAB — COMPREHENSIVE METABOLIC PANEL
ALBUMIN: 3.6 g/dL (ref 3.5–5.0)
ALK PHOS: 88 U/L (ref 40–150)
ALT: 17 U/L (ref 0–55)
AST: 24 U/L (ref 5–34)
Anion Gap: 7 mEq/L (ref 3–11)
BILIRUBIN TOTAL: 0.46 mg/dL (ref 0.20–1.20)
BUN: 12.5 mg/dL (ref 7.0–26.0)
CALCIUM: 9.1 mg/dL (ref 8.4–10.4)
CO2: 26 mEq/L (ref 22–29)
CREATININE: 1.1 mg/dL (ref 0.7–1.3)
Chloride: 107 mEq/L (ref 98–109)
EGFR: 79 mL/min/{1.73_m2} — ABNORMAL LOW (ref >90)
Glucose: 117 mg/dl (ref 70–140)
POTASSIUM: 4.4 meq/L (ref 3.5–5.1)
Sodium: 140 mEq/L (ref 136–145)
TOTAL PROTEIN: 7.2 g/dL (ref 6.4–8.3)

## 2016-12-31 LAB — MAGNESIUM: MAGNESIUM: 1.6 mg/dL (ref 1.5–2.5)

## 2016-12-31 MED ORDER — SODIUM CHLORIDE 0.9 % IV SOLN
Freq: Once | INTRAVENOUS | Status: AC
  Administered 2016-12-31: 10:00:00 via INTRAVENOUS

## 2016-12-31 MED ORDER — ATROPINE SULFATE 1 MG/ML IJ SOLN
INTRAMUSCULAR | Status: AC
  Filled 2016-12-31: qty 1

## 2016-12-31 MED ORDER — DEXAMETHASONE SODIUM PHOSPHATE 10 MG/ML IJ SOLN
INTRAMUSCULAR | Status: AC
  Filled 2016-12-31: qty 1

## 2016-12-31 MED ORDER — PALONOSETRON HCL INJECTION 0.25 MG/5ML
INTRAVENOUS | Status: AC
  Filled 2016-12-31: qty 5

## 2016-12-31 MED ORDER — DEXAMETHASONE SODIUM PHOSPHATE 10 MG/ML IJ SOLN
10.0000 mg | Freq: Once | INTRAMUSCULAR | Status: AC
  Administered 2016-12-31: 10 mg via INTRAVENOUS

## 2016-12-31 MED ORDER — SODIUM CHLORIDE 0.9 % IV SOLN
6.0000 mg/kg | Freq: Once | INTRAVENOUS | Status: AC
  Administered 2016-12-31: 500 mg via INTRAVENOUS
  Filled 2016-12-31: qty 20

## 2016-12-31 MED ORDER — FLUOROURACIL CHEMO INJECTION 2.5 GM/50ML
300.0000 mg/m2 | Freq: Once | INTRAVENOUS | Status: AC
  Administered 2016-12-31: 600 mg via INTRAVENOUS
  Filled 2016-12-31: qty 12

## 2016-12-31 MED ORDER — SODIUM CHLORIDE 0.9% FLUSH
10.0000 mL | INTRAVENOUS | Status: DC | PRN
  Filled 2016-12-31: qty 10

## 2016-12-31 MED ORDER — HEPARIN SOD (PORK) LOCK FLUSH 100 UNIT/ML IV SOLN
500.0000 [IU] | Freq: Once | INTRAVENOUS | Status: DC | PRN
  Filled 2016-12-31: qty 5

## 2016-12-31 MED ORDER — IRINOTECAN HCL CHEMO INJECTION 100 MG/5ML
125.0000 mg/m2 | Freq: Once | INTRAVENOUS | Status: AC
  Administered 2016-12-31: 260 mg via INTRAVENOUS
  Filled 2016-12-31: qty 5

## 2016-12-31 MED ORDER — ATROPINE SULFATE 1 MG/ML IJ SOLN
0.5000 mg | Freq: Once | INTRAMUSCULAR | Status: AC | PRN
  Administered 2016-12-31: 0.5 mg via INTRAVENOUS

## 2016-12-31 MED ORDER — SODIUM CHLORIDE 0.9 % IV SOLN
2400.0000 mg/m2 | INTRAVENOUS | Status: DC
  Administered 2016-12-31: 4900 mg via INTRAVENOUS
  Filled 2016-12-31: qty 98

## 2016-12-31 MED ORDER — PALONOSETRON HCL INJECTION 0.25 MG/5ML
0.2500 mg | Freq: Once | INTRAVENOUS | Status: AC
  Administered 2016-12-31: 0.25 mg via INTRAVENOUS

## 2016-12-31 MED ORDER — LEUCOVORIN CALCIUM INJECTION 350 MG
300.0000 mg/m2 | Freq: Once | INTRAVENOUS | Status: AC
  Administered 2016-12-31: 616 mg via INTRAVENOUS
  Filled 2016-12-31: qty 30.8

## 2016-12-31 MED ORDER — SODIUM CHLORIDE 0.9 % IJ SOLN
10.0000 mL | INTRAMUSCULAR | Status: AC | PRN
  Administered 2016-12-31: 10 mL via INTRAVENOUS
  Filled 2016-12-31: qty 10

## 2016-12-31 NOTE — Telephone Encounter (Signed)
appts made and avs printed for patient,contrast was given

## 2016-12-31 NOTE — Patient Instructions (Signed)
Implanted Port Home Guide An implanted port is a type of central line that is placed under the skin. Central lines are used to provide IV access when treatment or nutrition needs to be given through a person's veins. Implanted ports are used for long-term IV access. An implanted port may be placed because:  You need IV medicine that would be irritating to the small veins in your hands or arms.  You need long-term IV medicines, such as antibiotics.  You need IV nutrition for a long period.  You need frequent blood draws for lab tests.  You need dialysis.  Implanted ports are usually placed in the chest area, but they can also be placed in the upper arm, the abdomen, or the leg. An implanted port has two main parts:  Reservoir. The reservoir is round and will appear as a small, raised area under your skin. The reservoir is the part where a needle is inserted to give medicines or draw blood.  Catheter. The catheter is a thin, flexible tube that extends from the reservoir. The catheter is placed into a large vein. Medicine that is inserted into the reservoir goes into the catheter and then into the vein.  How will I care for my incision site? Do not get the incision site wet. Bathe or shower as directed by your health care provider. How is my port accessed? Special steps must be taken to access the port:  Before the port is accessed, a numbing cream can be placed on the skin. This helps numb the skin over the port site.  Your health care provider uses a sterile technique to access the port. ? Your health care provider must put on a mask and sterile gloves. ? The skin over your port is cleaned carefully with an antiseptic and allowed to dry. ? The port is gently pinched between sterile gloves, and a needle is inserted into the port.  Only "non-coring" port needles should be used to access the port. Once the port is accessed, a blood return should be checked. This helps ensure that the port  is in the vein and is not clogged.  If your port needs to remain accessed for a constant infusion, a clear (transparent) bandage will be placed over the needle site. The bandage and needle will need to be changed every week, or as directed by your health care provider.  Keep the bandage covering the needle clean and dry. Do not get it wet. Follow your health care provider's instructions on how to take a shower or bath while the port is accessed.  If your port does not need to stay accessed, no bandage is needed over the port.  What is flushing? Flushing helps keep the port from getting clogged. Follow your health care provider's instructions on how and when to flush the port. Ports are usually flushed with saline solution or a medicine called heparin. The need for flushing will depend on how the port is used.  If the port is used for intermittent medicines or blood draws, the port will need to be flushed: ? After medicines have been given. ? After blood has been drawn. ? As part of routine maintenance.  If a constant infusion is running, the port may not need to be flushed.  How long will my port stay implanted? The port can stay in for as long as your health care provider thinks it is needed. When it is time for the port to come out, surgery will be   done to remove it. The procedure is similar to the one performed when the port was put in. When should I seek immediate medical care? When you have an implanted port, you should seek immediate medical care if:  You notice a bad smell coming from the incision site.  You have swelling, redness, or drainage at the incision site.  You have more swelling or pain at the port site or the surrounding area.  You have a fever that is not controlled with medicine.  This information is not intended to replace advice given to you by your health care provider. Make sure you discuss any questions you have with your health care provider. Document  Released: 05/20/2005 Document Revised: 10/26/2015 Document Reviewed: 01/25/2013 Elsevier Interactive Patient Education  2017 [employer]  

## 2016-12-31 NOTE — Progress Notes (Signed)
[facility] OFFICE PROGRESS NOTE   Diagnosis: Colon cancer  INTERVAL HISTORY:   Hunter Walker returns as scheduled radio completed another cycle of FOLFIRI/panitumumab 12/16/2016. No nausea/vomiting or diarrhea following chemotherapy. He had an episode of diarrhea today. He has a rash over the face and trunk. He is using minocycline and lotion. The neuropathy symptoms have improved.  Objective:  Vital signs in last 24 hours:  Blood pressure 123/86, pulse 72, temperature 97.7 F (36.5 C), temperature source Oral, resp. rate 18, height 6' (1.829 m), weight 184 lb 11.2 oz (83.8 kg), SpO2 100 %.    HEENT: Mild conjunctival erythema, hyperpigmentation of the tongue and buccal mucosa. No thrush or ulcers Resp: Lungs clear bilaterally Cardio: Regular rate and rhythm GI: No hepatomegaly, nontender Vascular: No leg edema  Skin: Dry and hyperpigmented rash over the trunk with superficial flaking. Mild facial rash.   Portacath/PICC-without erythema  Lab Results:  Lab Results  Component Value Date   WBC 4.1 12/31/2016   HGB 14.4 12/31/2016   HCT 44.0 12/31/2016   MCV 102.3 (H) 12/31/2016   PLT 187 12/31/2016   NEUTROABS 2.1 12/31/2016    CMP     Component Value Date/Time   NA 140 12/31/2016 0805   K 4.4 12/31/2016 0805   CL 109 07/30/2015 1844   CO2 26 12/31/2016 0805   GLUCOSE 117 12/31/2016 0805   BUN 12.5 12/31/2016 0805   CREATININE 1.1 12/31/2016 0805   CALCIUM 9.1 12/31/2016 0805   PROT 7.2 12/31/2016 0805   ALBUMIN 3.6 12/31/2016 0805   AST 24 12/31/2016 0805   ALT 17 12/31/2016 0805   ALKPHOS 88 12/31/2016 0805   BILITOT 0.46 12/31/2016 0805   GFRNONAA >60 07/30/2015 1844   GFRAA >60 07/30/2015 1844    Lab Results  Component Value Date   CEA1 <1.00 12/02/2016     Medications: I have reviewed the patient's current medications.  Assessment/Plan: 1. Rectal cancer, invasive adenocarcinoma confirmed at colonoscopy 05/22/2015, EUS staging  06/01/2015 revealed a uT3,N1 lesion at 7 cm from the anal verge  Staging CT abdomen/pelvis 05/15/2015 confirmed a mass at the rectosigmoid colon, a suspicious perirectal lymph node, and a 6 mm right liver lesion felt to most likely be a cyst  CT chest 05/30/2015 with indeterminate pulmonary nodules and multiple liver lesions suspicious for metastases  Ultrasound 06/13/2014 with no liver lesions identified  MRI abdomen 06/21/2015 consistent with multiple liver metastases  CT biopsy of liver lesion 06/26/2015. Pathology showed metastatic adenocarcinoma consistent with colonic primary.MSI-stable, low mutation burden, no RAS or BRAFmutation  Cycle 1 FOLFOX 07/10/2015  Cycle 2 FOLFOX plus Avastin 07/24/2015 with Neulasta support  Cycle 3 FOLFOX plus Avastin 08/07/2015  Cycle 4 FOLFOX plus Avastin 08/21/2015  cycle 5 FOLFOX plus Avastin 09/04/2015  Restaging CTs 09/15/2015 revealed a decrease in the size of liver lesions, decreased rectal primary, stable/decreased size of tiny lung nodules  Cycle 6 FOLFOX plus Avastin 09/18/2015  Cycle 7 FOLFOX plus Avastin 10/02/2015  Cycle 8 FOLFOX plus Avastin 10/16/2015  Cycle 9 FOLFOX plus Avastin 11/06/2015  Cycle 10 FOLFOX plus Avastin 11/20/2015  CTs 12/07/2015-mild decrease in wall thickening at the rectum, decrease in tiny hepatic metastases, stable lung nodules, no evidence of progressive metastatic disease, new left lower lobe infiltrate  Treatment changed to every three-week Avastin with every other week Xeloda beginning 12/11/2015  Restaging CTs 04/12/2016-interval increase in size of adjacent right upper lobe pulmonary nodules and right lower lobe pulmonary nodule; unchanged 5 mm  lesion right hepatic lobe; no additional visualized hepatic metastasis. Grossly unchanged wall thickening of the rectum.  Continuation of Xeloda every other week and every three-week AvastinXeloda placed on hold 05/06/2016 due to hand-foot  syndrome  Xeloda resumed 05/28/2016 1500 mg every morning and 1000 mg every afternoon 7 days on/7 days off  CT 09/27/2016-enlargement of lung nodules and new liver lesions, stable rectal mass  Cycle 1 FOLFIRI/panitumumab 10/08/2016  Cycle 2 panitumumab 10/23/2016 (FOLFIRI held secondary to neutropenia)  Cycle 3 FOLFIRI/PANITUMUMAB 11/04/2016  Cycle 4 FOLFIRI/panitumumab 11/18/2016  Cycle 5 FOLFIRI/panitumumab 12/16/2016  Cycle 6 FOLFIRI/panitumumab 12/31/2016 2.Multiple colon polyps on the colonoscopy 05/22/2015, tubular villous adenoma and a tubular adenoma were removed 3.Anemia-likely secondary to rectal bleeding.  4.Family history of multiple cancers including breast and prostate cancer 5.Transient right arm numbness 06/20/2015 6. Port-A-Cath placement 07/06/2015 by Dr. Marcello Moores 7. Mild neutropenia secondary to chemotherapy following cycle 1 FOLFOX. Neulasta added with cycle 2. 8. Delayed nausea following chemotherapy-emend added with cycle 3 FOLFOX 9. Early oxaliplatin neuropathy 10. 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 Xeloda resumed at a reduced dose. 11. Bilateral hand weakness. Question related to previous oxaliplatin. Referred to neurology 06/17/2016. 12. Hypertension-amlodipine started 07/08/2016 13. Rash secondary to PANITUMUMAB. He continues minocycline. 14. Neutropenia following cycle 1 FOLFIRI/PANITUMUMAB; neutropenia following cycle 4 FOLFIRI/PANITUMUMAB.    Disposition:  Hunter Walker appears stable. He will complete a 6 treatment with panitumumab today. He will complete cycle 5 FOLFIRI today. Hunter Walker we scheduled for restaging CTs after this cycle.  He will return for an office visit and chemotherapy in 2 weeks. He will continue minocycline and skin moisturizers for the panitumumab rash.  15 minutes were spent with the patient today. The majority of the time was used for counseling and coordination of care.  Donneta Romberg, MD  12/31/2016  9:04 AM

## 2016-12-31 NOTE — Patient Instructions (Addendum)
[facility] Discharge Instructions for Patients Receiving Chemotherapy PUMP DISCONNECTION 3:30 pm on Thursday 01/02/2017  Today you received the following chemotherapy agents Vectibix, Irinotecan, Leucovorin and Adrucil   To help prevent nausea and vomiting after your treatment, we encourage you to take your nausea medication as directed. No Zofran for 3 days. Take Compazine instead.    If you develop nausea and vomiting that is not controlled by your nausea medication, call the clinic.   BELOW ARE SYMPTOMS THAT SHOULD BE REPORTED IMMEDIATELY:  *FEVER GREATER THAN 100.5 F  *CHILLS WITH OR WITHOUT FEVER  NAUSEA AND VOMITING THAT IS NOT CONTROLLED WITH YOUR NAUSEA MEDICATION  *UNUSUAL SHORTNESS OF BREATH  *UNUSUAL BRUISING OR BLEEDING  TENDERNESS IN MOUTH AND THROAT WITH OR WITHOUT PRESENCE OF ULCERS  *URINARY PROBLEMS  *BOWEL PROBLEMS  UNUSUAL RASH Items with * indicate a potential emergency and should be followed up as soon as possible.  Feel free to call the clinic you have any questions or concerns. The clinic phone number is [phone].  Please show the [place] at check-in to the Emergency Department and triage nurse.

## 2017-01-02 ENCOUNTER — Ambulatory Visit (HOSPITAL_BASED_OUTPATIENT_CLINIC_OR_DEPARTMENT_OTHER): Payer: Medicare Other

## 2017-01-02 DIAGNOSIS — C20 Malignant neoplasm of rectum: Secondary | ICD-10-CM

## 2017-01-02 DIAGNOSIS — Z95828 Presence of other vascular implants and grafts: Secondary | ICD-10-CM

## 2017-01-02 DIAGNOSIS — Z452 Encounter for adjustment and management of vascular access device: Secondary | ICD-10-CM

## 2017-01-02 MED ORDER — SODIUM CHLORIDE 0.9 % IJ SOLN
10.0000 mL | INTRAMUSCULAR | Status: DC | PRN
  Administered 2017-01-02: 10 mL via INTRAVENOUS
  Filled 2017-01-02: qty 10

## 2017-01-02 MED ORDER — HEPARIN SOD (PORK) LOCK FLUSH 100 UNIT/ML IV SOLN
500.0000 [IU] | Freq: Once | INTRAVENOUS | Status: AC | PRN
  Administered 2017-01-02: 500 [IU] via INTRAVENOUS
  Filled 2017-01-02: qty 5

## 2017-01-02 NOTE — Patient Instructions (Signed)

## 2017-01-12 ENCOUNTER — Other Ambulatory Visit: Payer: Self-pay | Admitting: Oncology

## 2017-01-13 ENCOUNTER — Ambulatory Visit: Payer: Medicare Other

## 2017-01-13 ENCOUNTER — Ambulatory Visit (HOSPITAL_BASED_OUTPATIENT_CLINIC_OR_DEPARTMENT_OTHER): Payer: Medicare Other | Admitting: Nurse Practitioner

## 2017-01-13 ENCOUNTER — Telehealth: Payer: Self-pay | Admitting: Emergency Medicine

## 2017-01-13 ENCOUNTER — Other Ambulatory Visit (HOSPITAL_BASED_OUTPATIENT_CLINIC_OR_DEPARTMENT_OTHER): Payer: Medicare Other

## 2017-01-13 ENCOUNTER — Ambulatory Visit (HOSPITAL_BASED_OUTPATIENT_CLINIC_OR_DEPARTMENT_OTHER): Payer: Medicare Other

## 2017-01-13 VITALS — BP 127/89 | HR 71 | Temp 98.7°F | Resp 18 | Ht 72.0 in | Wt 187.1 lb

## 2017-01-13 DIAGNOSIS — G620 Drug-induced polyneuropathy: Secondary | ICD-10-CM

## 2017-01-13 DIAGNOSIS — C20 Malignant neoplasm of rectum: Secondary | ICD-10-CM

## 2017-01-13 DIAGNOSIS — L271 Localized skin eruption due to drugs and medicaments taken internally: Secondary | ICD-10-CM

## 2017-01-13 DIAGNOSIS — Z95828 Presence of other vascular implants and grafts: Secondary | ICD-10-CM

## 2017-01-13 LAB — CBC WITH DIFFERENTIAL/PLATELET
BASO%: 0.7 % (ref 0.0–2.0)
BASOS ABS: 0 10*3/uL (ref 0.0–0.1)
EOS ABS: 0.1 10*3/uL (ref 0.0–0.5)
EOS%: 2.7 % (ref 0.0–7.0)
HEMATOCRIT: 42.7 % (ref 38.4–49.9)
HEMOGLOBIN: 14.3 g/dL (ref 13.0–17.1)
LYMPH#: 2 10*3/uL (ref 0.9–3.3)
LYMPH%: 41.9 % (ref 14.0–49.0)
MCH: 34.2 pg — AB (ref 27.2–33.4)
MCHC: 33.5 g/dL (ref 32.0–36.0)
MCV: 101.9 fL — AB (ref 79.3–98.0)
MONO#: 0.4 10*3/uL (ref 0.1–0.9)
MONO%: 7.9 % (ref 0.0–14.0)
NEUT#: 2.2 10*3/uL (ref 1.5–6.5)
NEUT%: 46.8 % (ref 39.0–75.0)
Platelets: 159 10*3/uL (ref 140–400)
RBC: 4.19 10*6/uL — ABNORMAL LOW (ref 4.20–5.82)
RDW: 17 % — AB (ref 11.0–14.6)
WBC: 4.7 10*3/uL (ref 4.0–10.3)

## 2017-01-13 LAB — COMPREHENSIVE METABOLIC PANEL
ALT: 15 U/L (ref 0–55)
ANION GAP: 8 meq/L (ref 3–11)
AST: 20 U/L (ref 5–34)
Albumin: 3.5 g/dL (ref 3.5–5.0)
Alkaline Phosphatase: 95 U/L (ref 40–150)
BUN: 11.6 mg/dL (ref 7.0–26.0)
CALCIUM: 9.9 mg/dL (ref 8.4–10.4)
CHLORIDE: 107 meq/L (ref 98–109)
CO2: 27 mEq/L (ref 22–29)
CREATININE: 1 mg/dL (ref 0.7–1.3)
EGFR: 88 mL/min/{1.73_m2} — AB (ref >90)
Glucose: 107 mg/dl (ref 70–140)
Potassium: 4.3 mEq/L (ref 3.5–5.1)
Sodium: 141 mEq/L (ref 136–145)
Total Bilirubin: 0.43 mg/dL (ref 0.20–1.20)
Total Protein: 7.2 g/dL (ref 6.4–8.3)

## 2017-01-13 LAB — MAGNESIUM: MAGNESIUM: 1.6 mg/dL (ref 1.5–2.5)

## 2017-01-13 MED ORDER — AMLODIPINE BESYLATE 5 MG PO TABS: 5.0000 mg | ORAL_TABLET | Freq: Every day | ORAL | 2 refills | Status: DC

## 2017-01-13 MED ORDER — PANTOPRAZOLE SODIUM 40 MG PO TBEC: 40.0000 mg | DELAYED_RELEASE_TABLET | Freq: Every day | ORAL | 3 refills | Status: DC

## 2017-01-13 MED ORDER — MINOCYCLINE HCL 100 MG PO CAPS: 100.0000 mg | ORAL_CAPSULE | Freq: Two times a day (BID) | ORAL | 2 refills | Status: DC

## 2017-01-13 MED ORDER — SODIUM CHLORIDE 0.9 % IJ SOLN
10.0000 mL | INTRAMUSCULAR | Status: DC | PRN
  Administered 2017-01-13: 10 mL via INTRAVENOUS
  Filled 2017-01-13: qty 10

## 2017-01-13 MED ORDER — SODIUM CHLORIDE 0.9% FLUSH
10.0000 mL | INTRAVENOUS | Status: DC | PRN
  Administered 2017-01-13: 10 mL via INTRAVENOUS
  Filled 2017-01-13: qty 10

## 2017-01-13 MED ORDER — PROCHLORPERAZINE MALEATE 10 MG PO TABS: ORAL_TABLET | ORAL | 2 refills | Status: DC

## 2017-01-13 MED ORDER — HEPARIN SOD (PORK) LOCK FLUSH 100 UNIT/ML IV SOLN
500.0000 [IU] | Freq: Once | INTRAVENOUS | Status: AC
  Administered 2017-01-13: 500 [IU] via INTRAVENOUS
  Filled 2017-01-13: qty 5

## 2017-01-13 NOTE — Progress Notes (Signed)
[name]   Diagnosis:  Colon cancer  INTERVAL HISTORY:   [name] returns as scheduled. He completed cycle 6 FOLFIRI/PANITUMUMAB 12/31/2016. He denies nausea/vomiting. No mouth sores. No diarrhea. Skin rash is unchanged. He notes that his skin is very dry.  Objective:  Vital signs in last 24 hours:  Blood pressure 127/89, pulse 71, temperature 98.7 F (37.1 C), temperature source Oral, resp. rate 18, height 6' (1.829 m), weight 187 lb 1.6 oz (84.9 kg), SpO2 100 %.    HEENT: No thrush or ulcers. Resp: Lungs clear bilaterally. Cardio: Regular rate and rhythm. GI: Abdomen soft and nontender. No hepatosplenomegaly. Vascular: No leg edema.  Skin: Acne type rash on the face. Skin in general has a very dry appearance. Port-A-Cath without erythema.    Lab Results:  Lab Results  Component Value Date   WBC 4.7 01/13/2017   HGB 14.3 01/13/2017   HCT 42.7 01/13/2017   MCV 101.9 (H) 01/13/2017   PLT 159 01/13/2017   NEUTROABS 2.2 01/13/2017    Imaging:  No results found.  Medications: I have reviewed the patient's current medications.  Assessment/Plan: 1. Rectal cancer, invasive adenocarcinoma confirmed at colonoscopy 05/22/2015, EUS staging 06/01/2015 revealed a uT3,N1 lesion at 7 cm from the anal verge  Staging CT abdomen/pelvis 05/15/2015 confirmed a mass at the rectosigmoid colon, a suspicious perirectal lymph node, and a 6 mm right liver lesion felt to most likely be a cyst  CT chest 05/30/2015 with indeterminate pulmonary nodules and multiple liver lesions suspicious for metastases  Ultrasound 06/13/2014 with no liver lesions identified  MRI abdomen 06/21/2015 consistent with multiple liver metastases  CT biopsy of liver lesion 06/26/2015. Pathology showed metastatic adenocarcinoma consistent with colonic primary.MSI-stable, low mutation burden, no RAS or BRAFmutation  Cycle 1 FOLFOX 07/10/2015  Cycle 2 FOLFOX plus  Avastin 07/24/2015 with Neulasta support  Cycle 3 FOLFOX plus Avastin 08/07/2015  Cycle 4 FOLFOX plus Avastin 08/21/2015  cycle 5 FOLFOX plus Avastin 09/04/2015  Restaging CTs 09/15/2015 revealed a decrease in the size of liver lesions, decreased rectal primary, stable/decreased size of tiny lung nodules  Cycle 6 FOLFOX plus Avastin 09/18/2015  Cycle 7 FOLFOX plus Avastin 10/02/2015  Cycle 8 FOLFOX plus Avastin 10/16/2015  Cycle 9 FOLFOX plus Avastin 11/06/2015  Cycle 10 FOLFOX plus Avastin 11/20/2015  CTs 12/07/2015-mild decrease in wall thickening at the rectum, decrease in tiny hepatic metastases, stable lung nodules, no evidence of progressive metastatic disease, new left lower lobe infiltrate  Treatment changed to every three-week Avastin with every other week Xeloda beginning 12/11/2015  Restaging CTs 04/12/2016-interval increase in size of adjacent right upper lobe pulmonary nodules and right lower lobe pulmonary nodule; unchanged 5 mm lesion right hepatic lobe; no additional visualized hepatic metastasis. Grossly unchanged wall thickening of the rectum.  Continuation of Xeloda every other week and every three-week AvastinXeloda placed on hold 05/06/2016 due to hand-foot syndrome  Xeloda resumed 05/28/2016 1500 mg every morning and 1000 mg every afternoon 7 days on/7 days off  CT 09/27/2016-enlargement of lung nodules and new liver lesions, stable rectal mass  Cycle 1 FOLFIRI/panitumumab 10/08/2016  Cycle 2 panitumumab 10/23/2016 (FOLFIRI held secondary to neutropenia)  Cycle 3 FOLFIRI/PANITUMUMAB 11/04/2016  Cycle 4 FOLFIRI/panitumumab 11/18/2016  Cycle 5 FOLFIRI/panitumumab 12/16/2016  Cycle 6 FOLFIRI/panitumumab 12/31/2016 2.Multiple colon polyps on the colonoscopy 05/22/2015, tubular villous adenoma and a tubular adenoma were removed 3.Anemia-likely secondary to rectal bleeding.  4.Family history of multiple cancers including breast and prostate  cancer  5.Transient right arm numbness 06/20/2015 6. Port-A-Cath placement 07/06/2015 by Dr. Marcello Moores 7. Mild neutropenia secondary to chemotherapy following cycle 1 FOLFOX. Neulasta added with cycle 2. 8. Delayed nausea following chemotherapy-emend added with cycle 3 FOLFOX 9. Early oxaliplatin neuropathy 10. 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 Xeloda resumed at a reduced dose. 11. Bilateral hand weakness. Question related to previous oxaliplatin. Referred to neurology 06/17/2016. 12. Hypertension-amlodipine started 07/08/2016 13. Rash secondary to PANITUMUMAB. He continues minocycline. 14. Neutropenia following cycle 1 FOLFIRI/PANITUMUMAB; neutropenia following cycle 4 FOLFIRI/PANITUMUMAB.     Disposition: Hunter Walker appears stable. He has completed 6 cycles of FOLFIRI/PANITUMUMAB. The plan was for restaging CTs last week. Unfortunately this did not occur. Mr. Mrozek would like to hold today's treatment and have the CT scans done later this week. He will return for a follow-up visit in one week to review the results.  For the skin rash he will continue minocycline. We also discussed beginning a moisturizing lotion.  Plan reviewed with Dr. Benay Spice.  Ned Card ANP/GNP-BC   01/13/2017  10:10 AM

## 2017-01-13 NOTE — Telephone Encounter (Signed)
Radiology Scheduling called to make a appt for ct for patient. Patient made aware of appt being Thursday August 16th at 12:30.

## 2017-01-13 NOTE — Addendum Note (Signed)
Addended by: Hughie Closs on: 01/13/2017 11:10 AM   Modules accepted: Orders, SmartSet

## 2017-01-16 ENCOUNTER — Ambulatory Visit (HOSPITAL_COMMUNITY)
Admission: RE | Admit: 2017-01-16 | Discharge: 2017-01-16 | Disposition: A | Discharge status: Home / Self Care | Payer: Medicare Other | Source: Ambulatory Visit | Attending: Oncology | Admitting: Oncology

## 2017-01-16 DIAGNOSIS — I700 Atherosclerosis of aorta: Secondary | ICD-10-CM | POA: Insufficient documentation

## 2017-01-16 DIAGNOSIS — C20 Malignant neoplasm of rectum: Secondary | ICD-10-CM | POA: Diagnosis not present

## 2017-01-16 DIAGNOSIS — K760 Fatty (change of) liver, not elsewhere classified: Secondary | ICD-10-CM | POA: Diagnosis not present

## 2017-01-16 DIAGNOSIS — I2510 Atherosclerotic heart disease of native coronary artery without angina pectoris: Secondary | ICD-10-CM | POA: Diagnosis not present

## 2017-01-16 DIAGNOSIS — C772 Secondary and unspecified malignant neoplasm of intra-abdominal lymph nodes: Secondary | ICD-10-CM | POA: Insufficient documentation

## 2017-01-16 DIAGNOSIS — K769 Liver disease, unspecified: Secondary | ICD-10-CM | POA: Insufficient documentation

## 2017-01-16 DIAGNOSIS — R918 Other nonspecific abnormal finding of lung field: Secondary | ICD-10-CM | POA: Diagnosis not present

## 2017-01-16 MED ORDER — IOPAMIDOL (ISOVUE-300) INJECTION 61%
100.0000 mL | Freq: Once | INTRAVENOUS | Status: AC | PRN
  Administered 2017-01-16: 100 mL via INTRAVENOUS

## 2017-01-16 MED ORDER — IOPAMIDOL (ISOVUE-300) INJECTION 61%
INTRAVENOUS | Status: AC
  Filled 2017-01-16: qty 100

## 2017-01-17 ENCOUNTER — Telehealth: Payer: Self-pay

## 2017-01-17 NOTE — Telephone Encounter (Signed)
Call placed to patient to review his recent CT results and his upcoming appointments. Patient appreciative of call back.

## 2017-01-20 ENCOUNTER — Telehealth: Payer: Self-pay | Admitting: Oncology

## 2017-01-20 ENCOUNTER — Ambulatory Visit: Payer: Medicare Other | Admitting: Oncology

## 2017-01-20 ENCOUNTER — Other Ambulatory Visit: Payer: Medicare Other

## 2017-01-20 NOTE — Telephone Encounter (Signed)
sw pt to confirm r/s appt to 8/28 at 0800 per sch msg

## 2017-01-21 ENCOUNTER — Ambulatory Visit: Payer: Medicare Other

## 2017-01-24 ENCOUNTER — Other Ambulatory Visit: Payer: Self-pay | Admitting: *Deleted

## 2017-01-24 MED ORDER — BISACODYL 5 MG PO TBEC: 5.0000 mg | DELAYED_RELEASE_TABLET | Freq: Every day | ORAL | 5 refills | Status: DC | PRN

## 2017-01-28 ENCOUNTER — Ambulatory Visit (HOSPITAL_BASED_OUTPATIENT_CLINIC_OR_DEPARTMENT_OTHER): Payer: Medicare Other

## 2017-01-28 ENCOUNTER — Ambulatory Visit (HOSPITAL_BASED_OUTPATIENT_CLINIC_OR_DEPARTMENT_OTHER): Payer: Medicare Other | Admitting: Oncology

## 2017-01-28 ENCOUNTER — Other Ambulatory Visit (HOSPITAL_BASED_OUTPATIENT_CLINIC_OR_DEPARTMENT_OTHER): Payer: Medicare Other

## 2017-01-28 ENCOUNTER — Telehealth: Payer: Self-pay | Admitting: Oncology

## 2017-01-28 VITALS — BP 131/82 | HR 61 | Temp 98.2°F | Resp 18 | Ht 72.0 in | Wt 185.9 lb

## 2017-01-28 DIAGNOSIS — C20 Malignant neoplasm of rectum: Secondary | ICD-10-CM

## 2017-01-28 DIAGNOSIS — L271 Localized skin eruption due to drugs and medicaments taken internally: Secondary | ICD-10-CM | POA: Diagnosis not present

## 2017-01-28 DIAGNOSIS — Z95828 Presence of other vascular implants and grafts: Secondary | ICD-10-CM

## 2017-01-28 DIAGNOSIS — G620 Drug-induced polyneuropathy: Secondary | ICD-10-CM

## 2017-01-28 DIAGNOSIS — Z5111 Encounter for antineoplastic chemotherapy: Secondary | ICD-10-CM | POA: Diagnosis not present

## 2017-01-28 DIAGNOSIS — Z5112 Encounter for antineoplastic immunotherapy: Secondary | ICD-10-CM | POA: Diagnosis not present

## 2017-01-28 DIAGNOSIS — Z452 Encounter for adjustment and management of vascular access device: Secondary | ICD-10-CM | POA: Diagnosis not present

## 2017-01-28 LAB — CBC WITH DIFFERENTIAL/PLATELET
BASO%: 0.7 % (ref 0.0–2.0)
Basophils Absolute: 0 10*3/uL (ref 0.0–0.1)
EOS ABS: 0.1 10*3/uL (ref 0.0–0.5)
EOS%: 2.1 % (ref 0.0–7.0)
HCT: 44.6 % (ref 38.4–49.9)
HGB: 14.7 g/dL (ref 13.0–17.1)
LYMPH%: 34.3 % (ref 14.0–49.0)
MCH: 33.6 pg — ABNORMAL HIGH (ref 27.2–33.4)
MCHC: 33 g/dL (ref 32.0–36.0)
MCV: 101.9 fL — AB (ref 79.3–98.0)
MONO#: 0.5 10*3/uL (ref 0.1–0.9)
MONO%: 9.4 % (ref 0.0–14.0)
NEUT%: 53.5 % (ref 39.0–75.0)
NEUTROS ABS: 3.1 10*3/uL (ref 1.5–6.5)
PLATELETS: 173 10*3/uL (ref 140–400)
RBC: 4.38 10*6/uL (ref 4.20–5.82)
RDW: 17.1 % — ABNORMAL HIGH (ref 11.0–14.6)
WBC: 5.8 10*3/uL (ref 4.0–10.3)
lymph#: 2 10*3/uL (ref 0.9–3.3)

## 2017-01-28 LAB — COMPREHENSIVE METABOLIC PANEL
ALBUMIN: 3.5 g/dL (ref 3.5–5.0)
ALK PHOS: 98 U/L (ref 40–150)
ALT: 23 U/L (ref 0–55)
ANION GAP: 7 meq/L (ref 3–11)
AST: 26 U/L (ref 5–34)
BUN: 10.4 mg/dL (ref 7.0–26.0)
CALCIUM: 9.4 mg/dL (ref 8.4–10.4)
CHLORIDE: 108 meq/L (ref 98–109)
CO2: 24 mEq/L (ref 22–29)
Creatinine: 1.1 mg/dL (ref 0.7–1.3)
EGFR: 86 mL/min/{1.73_m2} — AB (ref >90)
Glucose: 95 mg/dl (ref 70–140)
POTASSIUM: 4.1 meq/L (ref 3.5–5.1)
Sodium: 140 mEq/L (ref 136–145)
Total Bilirubin: 0.45 mg/dL (ref 0.20–1.20)
Total Protein: 7.2 g/dL (ref 6.4–8.3)

## 2017-01-28 LAB — CEA (IN HOUSE-CHCC): CEA (CHCC-IN HOUSE): 1.12 ng/mL (ref 0.00–5.00)

## 2017-01-28 LAB — MAGNESIUM: MAGNESIUM: 1.7 mg/dL (ref 1.5–2.5)

## 2017-01-28 MED ORDER — IRINOTECAN HCL CHEMO INJECTION 100 MG/5ML
125.0000 mg/m2 | Freq: Once | INTRAVENOUS | Status: AC
  Administered 2017-01-28: 260 mg via INTRAVENOUS
  Filled 2017-01-28: qty 5

## 2017-01-28 MED ORDER — DEXAMETHASONE SODIUM PHOSPHATE 10 MG/ML IJ SOLN
INTRAMUSCULAR | Status: AC
  Filled 2017-01-28: qty 1

## 2017-01-28 MED ORDER — ALTEPLASE 2 MG IJ SOLR
2.0000 mg | Freq: Once | INTRAMUSCULAR | Status: AC | PRN
  Administered 2017-01-28: 2 mg
  Filled 2017-01-28: qty 2

## 2017-01-28 MED ORDER — SODIUM CHLORIDE 0.9 % IV SOLN
2400.0000 mg/m2 | INTRAVENOUS | Status: DC
  Administered 2017-01-28: 4900 mg via INTRAVENOUS
  Filled 2017-01-28: qty 98

## 2017-01-28 MED ORDER — SODIUM CHLORIDE 0.9 % IV SOLN
Freq: Once | INTRAVENOUS | Status: AC
  Administered 2017-01-28: 11:00:00 via INTRAVENOUS

## 2017-01-28 MED ORDER — SODIUM CHLORIDE 0.9 % IV SOLN
6.0000 mg/kg | Freq: Once | INTRAVENOUS | Status: AC
  Administered 2017-01-28: 500 mg via INTRAVENOUS
  Filled 2017-01-28: qty 20

## 2017-01-28 MED ORDER — FLUOROURACIL CHEMO INJECTION 2.5 GM/50ML
300.0000 mg/m2 | Freq: Once | INTRAVENOUS | Status: AC
  Administered 2017-01-28: 600 mg via INTRAVENOUS
  Filled 2017-01-28: qty 12

## 2017-01-28 MED ORDER — ATROPINE SULFATE 1 MG/ML IJ SOLN
0.5000 mg | Freq: Once | INTRAMUSCULAR | Status: AC | PRN
  Administered 2017-01-28: 0.5 mg via INTRAVENOUS

## 2017-01-28 MED ORDER — PALONOSETRON HCL INJECTION 0.25 MG/5ML
INTRAVENOUS | Status: AC
  Filled 2017-01-28: qty 5

## 2017-01-28 MED ORDER — PALONOSETRON HCL INJECTION 0.25 MG/5ML
0.2500 mg | Freq: Once | INTRAVENOUS | Status: AC
  Administered 2017-01-28: 0.25 mg via INTRAVENOUS

## 2017-01-28 MED ORDER — DEXAMETHASONE SODIUM PHOSPHATE 10 MG/ML IJ SOLN
10.0000 mg | Freq: Once | INTRAMUSCULAR | Status: AC
Start: 2017-01-28 — End: 2017-01-28
  Administered 2017-01-28: 10 mg via INTRAVENOUS

## 2017-01-28 MED ORDER — ATROPINE SULFATE 1 MG/ML IJ SOLN
INTRAMUSCULAR | Status: AC
  Filled 2017-01-28: qty 1

## 2017-01-28 MED ORDER — SODIUM CHLORIDE 0.9 % IJ SOLN
10.0000 mL | INTRAMUSCULAR | Status: DC | PRN
  Administered 2017-01-28: 10 mL via INTRAVENOUS
  Filled 2017-01-28: qty 10

## 2017-01-28 MED ORDER — LEUCOVORIN CALCIUM INJECTION 350 MG
300.0000 mg/m2 | Freq: Once | INTRAVENOUS | Status: AC
  Administered 2017-01-28: 616 mg via INTRAVENOUS
  Filled 2017-01-28: qty 30.8

## 2017-01-28 NOTE — Telephone Encounter (Signed)
Spoke with patient regarding his upcoming appts.   Hunter Walker regarding adding his treatment to 9/10.

## 2017-01-28 NOTE — Progress Notes (Signed)
Blood return noted before, during and after Adrucil push.  

## 2017-01-28 NOTE — Patient Instructions (Signed)
[facility] Discharge Instructions for Patients Receiving Chemotherapy  Today you received the following chemotherapy agents: Vectibix, Irinotecan, Leucovorin and Adrucil   To help prevent nausea and vomiting after your treatment, we encourage you to take your nausea medication as directed.    If you develop nausea and vomiting that is not controlled by your nausea medication, call the clinic.   BELOW ARE SYMPTOMS THAT SHOULD BE REPORTED IMMEDIATELY:  *FEVER GREATER THAN 100.5 F  *CHILLS WITH OR WITHOUT FEVER  NAUSEA AND VOMITING THAT IS NOT CONTROLLED WITH YOUR NAUSEA MEDICATION  *UNUSUAL SHORTNESS OF BREATH  *UNUSUAL BRUISING OR BLEEDING  TENDERNESS IN MOUTH AND THROAT WITH OR WITHOUT PRESENCE OF ULCERS  *URINARY PROBLEMS  *BOWEL PROBLEMS  UNUSUAL RASH Items with * indicate a potential emergency and should be followed up as soon as possible.  Feel free to call the clinic you have any questions or concerns. The clinic phone number is [phone].  Please show the [facility] at check-in to the Emergency Department and triage nurse.

## 2017-01-28 NOTE — Progress Notes (Signed)
[facility] OFFICE PROGRESS NOTE   Diagnosis: Rectal cancer  INTERVAL HISTORY:   Hunter Walker returns as scheduled. He was last treated with FOLFIRI/panitumumab on 12/31/2016. No nausea or diarrhea following chemotherapy. He continues to have dryness and a rash over the face and trunk. No skin breakdown at the hands or feet. Neuropathy symptoms have improved. He complains of malaise. He noted I erythema this morning. No change in vision.  Objective:  Vital signs in last 24 hours:  Blood pressure 131/82, pulse 61, temperature 98.2 F (36.8 C), temperature source Oral, resp. rate 18, height 6' (1.829 m), weight 185 lb 14.4 oz (84.3 kg), SpO2 100 %.    HEENT: Mild conjunctival erythema, no thrush or ulcers Resp: Lungs clear bilaterally Cardio: Regular rate and rhythm GI: No hepatosplenomegaly, no mass, nontender Vascular: No leg edema  Skin: Dry hyperpigmentation over the trunk diffusely. Hyperpigmentation of the hands. No skin breakdown. Mild acne type rash over the face.   Portacath/PICC-without erythema  Lab Results:  Lab Results  Component Value Date   WBC 5.8 01/28/2017   HGB 14.7 01/28/2017   HCT 44.6 01/28/2017   MCV 101.9 (H) 01/28/2017   PLT 173 01/28/2017   NEUTROABS 3.1 01/28/2017    CMP     Component Value Date/Time   NA 140 01/28/2017 0802   K 4.1 01/28/2017 0802   CL 109 07/30/2015 1844   CO2 24 01/28/2017 0802   GLUCOSE 95 01/28/2017 0802   BUN 10.4 01/28/2017 0802   CREATININE 1.1 01/28/2017 0802   CALCIUM 9.4 01/28/2017 0802   PROT 7.2 01/28/2017 0802   ALBUMIN 3.5 01/28/2017 0802   AST 26 01/28/2017 0802   ALT 23 01/28/2017 0802   ALKPHOS 98 01/28/2017 0802   BILITOT 0.45 01/28/2017 0802   GFRNONAA >60 07/30/2015 1844   GFRAA >60 07/30/2015 1844    Lab Results  Component Value Date   CEA1 <1.00 12/02/2016    Medications: I have reviewed the patient's current medications.  Assessment/Plan: 1. Rectal cancer, invasive  adenocarcinoma confirmed at colonoscopy 05/22/2015, EUS staging 06/01/2015 revealed a uT3,N1 lesion at 7 cm from the anal verge  Staging CT abdomen/pelvis 05/15/2015 confirmed a mass at the rectosigmoid colon, a suspicious perirectal lymph node, and a 6 mm right liver lesion felt to most likely be a cyst  CT chest 05/30/2015 with indeterminate pulmonary nodules and multiple liver lesions suspicious for metastases  Ultrasound 06/13/2014 with no liver lesions identified  MRI abdomen 06/21/2015 consistent with multiple liver metastases  CT biopsy of liver lesion 06/26/2015. Pathology showed metastatic adenocarcinoma consistent with colonic primary.MSI-stable, low mutation burden, no RAS or BRAFmutation  Cycle 1 FOLFOX 07/10/2015  Cycle 2 FOLFOX plus Avastin 07/24/2015 with Neulasta support  Cycle 3 FOLFOX plus Avastin 08/07/2015  Cycle 4 FOLFOX plus Avastin 08/21/2015  cycle 5 FOLFOX plus Avastin 09/04/2015  Restaging CTs 09/15/2015 revealed a decrease in the size of liver lesions, decreased rectal primary, stable/decreased size of tiny lung nodules  Cycle 6 FOLFOX plus Avastin 09/18/2015  Cycle 7 FOLFOX plus Avastin 10/02/2015  Cycle 8 FOLFOX plus Avastin 10/16/2015  Cycle 9 FOLFOX plus Avastin 11/06/2015  Cycle 10 FOLFOX plus Avastin 11/20/2015  CTs 12/07/2015-mild decrease in wall thickening at the rectum, decrease in tiny hepatic metastases, stable lung nodules, no evidence of progressive metastatic disease, new left lower lobe infiltrate  Treatment changed to every three-week Avastin with every other week Xeloda beginning 12/11/2015  Restaging CTs 04/12/2016-interval increase in size of adjacent  right upper lobe pulmonary nodules and right lower lobe pulmonary nodule; unchanged 5 mm lesion right hepatic lobe; no additional visualized hepatic metastasis. Grossly unchanged wall thickening of the rectum.  Continuation of Xeloda every other week and every three-week  AvastinXeloda placed on hold 05/06/2016 due to hand-foot syndrome  Xeloda resumed 05/28/2016 1500 mg every morning and 1000 mg every afternoon 7 days on/7 days off  CT 09/27/2016-enlargement of lung nodules and new liver lesions, stable rectal mass  Cycle 1 FOLFIRI/panitumumab 10/08/2016  Cycle 2 panitumumab 10/23/2016 (FOLFIRI held secondary to neutropenia)  Cycle 3 FOLFIRI/PANITUMUMAB 11/04/2016  Cycle 4 FOLFIRI/panitumumab 11/18/2016  Cycle 5 FOLFIRI/panitumumab 12/16/2016  Cycle 6 FOLFIRI/panitumumab 12/31/2016  CTs 01/16/2017-decreased size of pulmonary nodules and liver lesions. Decreased perirectal lymph node 2.Multiple colon polyps on the colonoscopy 05/22/2015, tubular villous adenoma and a tubular adenoma were removed 3.Anemia-likely secondary to rectal bleeding.  4.Family history of multiple cancers including breast and prostate cancer 5.Transient right arm numbness 06/20/2015 6. Port-A-Cath placement 07/06/2015 by Dr. Marcello Moores 7. Mild neutropenia secondary to chemotherapy following cycle 1 FOLFOX. Neulasta added with cycle 2. 8. Delayed nausea following chemotherapy-emend added with cycle 3 FOLFOX 9. Early oxaliplatin neuropathy 10. 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 Xeloda resumed at a reduced dose. 11. Bilateral hand weakness. Question related to previous oxaliplatin. Referred to neurology 06/17/2016. 12. Hypertension-amlodipine started 07/08/2016 13. Rash secondary to PANITUMUMAB. He continues minocycline. 14. Neutropenia following cycle 1 FOLFIRI/PANITUMUMAB; neutropenia following cycle 4 FOLFIRI/PANITUMUMAB.  Disposition:  Hunter Walker has completed 6 cycles of FOLFIRI/panitumumab. He has tolerated the chemotherapy well. The restaging CTs reveal a response to therapy. The plan is to continue FOLFIRI/panitumumab. He has mild conjunctival erythema today. This is likely secondary to panitumumab. He will contact us if this  progresses. I reviewed the restaging CT images. Hunter Walker will return for an office visit and chemotherapy in 2 weeks.  Donneta Romberg, MD  01/28/2017  9:03 AM

## 2017-01-30 ENCOUNTER — Ambulatory Visit (HOSPITAL_BASED_OUTPATIENT_CLINIC_OR_DEPARTMENT_OTHER): Payer: Medicare Other

## 2017-01-30 VITALS — BP 117/75 | HR 73 | Temp 98.8°F | Resp 18

## 2017-01-30 DIAGNOSIS — Z452 Encounter for adjustment and management of vascular access device: Secondary | ICD-10-CM

## 2017-01-30 DIAGNOSIS — C20 Malignant neoplasm of rectum: Secondary | ICD-10-CM | POA: Diagnosis not present

## 2017-01-30 MED ORDER — HEPARIN SOD (PORK) LOCK FLUSH 100 UNIT/ML IV SOLN
500.0000 [IU] | Freq: Once | INTRAVENOUS | Status: AC | PRN
  Administered 2017-01-30: 500 [IU]
  Filled 2017-01-30: qty 5

## 2017-01-30 MED ORDER — SODIUM CHLORIDE 0.9% FLUSH
10.0000 mL | INTRAVENOUS | Status: DC | PRN
  Administered 2017-01-30: 10 mL
  Filled 2017-01-30: qty 10

## 2017-02-04 ENCOUNTER — Telehealth: Payer: Self-pay | Admitting: Oncology

## 2017-02-04 NOTE — Telephone Encounter (Signed)
Spoke with patient regarding his upcoming appts on 9/10.

## 2017-02-09 ENCOUNTER — Other Ambulatory Visit: Payer: Self-pay | Admitting: Oncology

## 2017-02-10 ENCOUNTER — Ambulatory Visit (HOSPITAL_BASED_OUTPATIENT_CLINIC_OR_DEPARTMENT_OTHER): Payer: Medicare Other

## 2017-02-10 ENCOUNTER — Telehealth: Payer: Self-pay | Admitting: Oncology

## 2017-02-10 ENCOUNTER — Other Ambulatory Visit (HOSPITAL_BASED_OUTPATIENT_CLINIC_OR_DEPARTMENT_OTHER): Payer: Medicare Other

## 2017-02-10 ENCOUNTER — Ambulatory Visit (HOSPITAL_BASED_OUTPATIENT_CLINIC_OR_DEPARTMENT_OTHER): Payer: Medicare Other | Admitting: Oncology

## 2017-02-10 ENCOUNTER — Encounter: Payer: Self-pay | Admitting: Oncology

## 2017-02-10 VITALS — BP 120/86 | HR 77 | Temp 98.2°F | Resp 18 | Ht 72.0 in | Wt 188.0 lb

## 2017-02-10 DIAGNOSIS — Z5111 Encounter for antineoplastic chemotherapy: Secondary | ICD-10-CM

## 2017-02-10 DIAGNOSIS — C20 Malignant neoplasm of rectum: Secondary | ICD-10-CM

## 2017-02-10 DIAGNOSIS — L271 Localized skin eruption due to drugs and medicaments taken internally: Secondary | ICD-10-CM

## 2017-02-10 DIAGNOSIS — Z5112 Encounter for antineoplastic immunotherapy: Secondary | ICD-10-CM

## 2017-02-10 DIAGNOSIS — Z95828 Presence of other vascular implants and grafts: Secondary | ICD-10-CM

## 2017-02-10 DIAGNOSIS — G620 Drug-induced polyneuropathy: Secondary | ICD-10-CM

## 2017-02-10 LAB — CBC WITH DIFFERENTIAL/PLATELET
BASO%: 0.4 % (ref 0.0–2.0)
Basophils Absolute: 0 10*3/uL (ref 0.0–0.1)
EOS ABS: 0.2 10*3/uL (ref 0.0–0.5)
EOS%: 3.7 % (ref 0.0–7.0)
HCT: 42.3 % (ref 38.4–49.9)
HEMOGLOBIN: 13.9 g/dL (ref 13.0–17.1)
LYMPH#: 2.1 10*3/uL (ref 0.9–3.3)
LYMPH%: 45.6 % (ref 14.0–49.0)
MCH: 33.5 pg — ABNORMAL HIGH (ref 27.2–33.4)
MCHC: 32.9 g/dL (ref 32.0–36.0)
MCV: 101.9 fL — AB (ref 79.3–98.0)
MONO#: 0.3 10*3/uL (ref 0.1–0.9)
MONO%: 6.8 % (ref 0.0–14.0)
NEUT#: 2 10*3/uL (ref 1.5–6.5)
NEUT%: 43.5 % (ref 39.0–75.0)
Platelets: 155 10*3/uL (ref 140–400)
RBC: 4.15 10*6/uL — ABNORMAL LOW (ref 4.20–5.82)
RDW: 15.8 % — AB (ref 11.0–14.6)
WBC: 4.6 10*3/uL (ref 4.0–10.3)

## 2017-02-10 LAB — MAGNESIUM: Magnesium: 1.9 mg/dl (ref 1.5–2.5)

## 2017-02-10 LAB — COMPREHENSIVE METABOLIC PANEL
ALT: 18 U/L (ref 0–55)
AST: 23 U/L (ref 5–34)
Albumin: 3.5 g/dL (ref 3.5–5.0)
Alkaline Phosphatase: 92 U/L (ref 40–150)
Anion Gap: 9 mEq/L (ref 3–11)
BILIRUBIN TOTAL: 0.5 mg/dL (ref 0.20–1.20)
BUN: 10.8 mg/dL (ref 7.0–26.0)
CO2: 23 meq/L (ref 22–29)
Calcium: 9.4 mg/dL (ref 8.4–10.4)
Chloride: 108 mEq/L (ref 98–109)
Creatinine: 1.1 mg/dL (ref 0.7–1.3)
EGFR: 84 mL/min/{1.73_m2} — AB (ref >90)
Glucose: 109 mg/dl (ref 70–140)
POTASSIUM: 4.1 meq/L (ref 3.5–5.1)
Sodium: 140 mEq/L (ref 136–145)
TOTAL PROTEIN: 7.3 g/dL (ref 6.4–8.3)

## 2017-02-10 MED ORDER — IRINOTECAN HCL CHEMO INJECTION 100 MG/5ML
125.0000 mg/m2 | Freq: Once | INTRAVENOUS | Status: AC
  Administered 2017-02-10: 260 mg via INTRAVENOUS
  Filled 2017-02-10: qty 8

## 2017-02-10 MED ORDER — SODIUM CHLORIDE 0.9 % IV SOLN
2400.0000 mg/m2 | INTRAVENOUS | Status: DC
  Administered 2017-02-10: 4900 mg via INTRAVENOUS
  Filled 2017-02-10: qty 98

## 2017-02-10 MED ORDER — SODIUM CHLORIDE 0.9 % IV SOLN: Freq: Once | INTRAVENOUS | Status: AC

## 2017-02-10 MED ORDER — LEUCOVORIN CALCIUM INJECTION 350 MG
300.0000 mg/m2 | Freq: Once | INTRAVENOUS | Status: AC
  Administered 2017-02-10: 616 mg via INTRAVENOUS
  Filled 2017-02-10: qty 30.8

## 2017-02-10 MED ORDER — SODIUM CHLORIDE 0.9 % IJ SOLN
10.0000 mL | INTRAMUSCULAR | Status: DC | PRN
  Administered 2017-02-10: 10 mL via INTRAVENOUS
  Filled 2017-02-10: qty 10

## 2017-02-10 MED ORDER — ATROPINE SULFATE 1 MG/ML IJ SOLN
0.5000 mg | Freq: Once | INTRAMUSCULAR | Status: AC | PRN
  Administered 2017-02-10: 0.5 mg via INTRAVENOUS

## 2017-02-10 MED ORDER — PALONOSETRON HCL INJECTION 0.25 MG/5ML
0.2500 mg | Freq: Once | INTRAVENOUS | Status: AC
  Administered 2017-02-10: 0.25 mg via INTRAVENOUS

## 2017-02-10 MED ORDER — SODIUM CHLORIDE 0.9 % IV SOLN
6.0000 mg/kg | Freq: Once | INTRAVENOUS | Status: AC
  Administered 2017-02-10: 500 mg via INTRAVENOUS
  Filled 2017-02-10: qty 20

## 2017-02-10 MED ORDER — ATROPINE SULFATE 1 MG/ML IJ SOLN
INTRAMUSCULAR | Status: AC
Start: 2017-02-10 — End: 2017-02-10
  Filled 2017-02-10: qty 1

## 2017-02-10 MED ORDER — PALONOSETRON HCL INJECTION 0.25 MG/5ML
INTRAVENOUS | Status: AC
  Filled 2017-02-10: qty 5

## 2017-02-10 MED ORDER — FLUOROURACIL CHEMO INJECTION 2.5 GM/50ML
300.0000 mg/m2 | Freq: Once | INTRAVENOUS | Status: AC
  Administered 2017-02-10: 600 mg via INTRAVENOUS
  Filled 2017-02-10: qty 12

## 2017-02-10 MED ORDER — SODIUM CHLORIDE 0.9 % IV SOLN
Freq: Once | INTRAVENOUS | Status: AC
  Administered 2017-02-10: 12:00:00 via INTRAVENOUS

## 2017-02-10 MED ORDER — DEXAMETHASONE SODIUM PHOSPHATE 10 MG/ML IJ SOLN
10.0000 mg | Freq: Once | INTRAMUSCULAR | Status: AC
  Administered 2017-02-10: 10 mg via INTRAVENOUS

## 2017-02-10 MED ORDER — DEXAMETHASONE SODIUM PHOSPHATE 10 MG/ML IJ SOLN
INTRAMUSCULAR | Status: AC
  Filled 2017-02-10: qty 1

## 2017-02-10 NOTE — Progress Notes (Signed)
[facility] Cancer Follow up:    Hunter Koch, MD [place] [address]   DIAGNOSIS: Cancer Staging Rectal cancer [facility]) - adenocarcinoma Staging form: Colon and Rectum, AJCC 7th Edition - Clinical: T3, N1, M1 - Signed by Ladell Pier, MD on 06/29/2015 - Pathologic: Stage Unknown ([place], NX, M1) - Signed by Ladell Pier, MD on 06/29/2015   SUMMARY OF ONCOLOGIC HISTORY:   Rectal cancer ([place]) - adenocarcinoma   06/15/2015 Initial Diagnosis    Rectal cancer ([place]) - adenocarcinoma      07/11/2015 Miscellaneous    [facility] One Testing scanned to media       CURRENT THERAPY: FOLFIRI/panitumumab status post 6 cycles  INTERVAL HISTORY: Hunter Walker 66 y.o. male returns for routine follow-up prior to his seventh cycle of chemotherapy. The patient continues to tolerate his treatment well. He denies nausea, vomiting, diarrhea following chemotherapy. He continues to have dry skin an rash over his face and trunk. This is unchanged. He continues to have erythema to his conjunctiva which is unchanged. Denies visual disturbances. Neuropathy symptoms have improved. The patient is here for evaluation prior to cycle 7 of his chemotherapy.   Patient Active Problem List   Diagnosis Date Noted  . Encounter for antineoplastic chemotherapy 02/10/2017  . Goals of care, counseling/discussion 09/30/2016  . Routine general medical examination at a health care facility 06/25/2016  . Port catheter in place 09/29/2015  . GERD (gastroesophageal reflux disease) 06/29/2015  . Rectal cancer ([place]) - adenocarcinoma 06/15/2015    has No Known Allergies.  MEDICAL HISTORY: Past Medical History:  Diagnosis Date  . Allergic rhinitis   . Cancer [facility])    rectal cancer  . GERD (gastroesophageal reflux disease)   . Hemorrhoids     SURGICAL HISTORY: Past Surgical History:  Procedure Laterality Date  . EUS N/A 06/01/2015   Procedure: LOWER ENDOSCOPIC  ULTRASOUND (EUS);  Surgeon: Milus Banister, MD;  Location: [facility];  Service: Endoscopy;  Laterality: N/A;  . LIVER BIOPSY  06/26/2015  . NO PAST SURGERIES    . PORTACATH PLACEMENT Right 07/06/2015   Procedure: INSERTION PORT-A-CATH;  Surgeon: Leighton Ruff, MD;  Location: [facility];  Service: General;  Laterality: Right;    SOCIAL HISTORY: Social History   Social History  . Marital status: Married    Spouse name: N/A  . Number of children: 2  . Years of education: college   Occupational History  . retired Pharmacist, hospital    Social History Main Topics  . Smoking status: Former Smoker    Packs/day: 1.00    Years: 10.00    Types: Cigarettes    Quit date: 06/03/1978  . Smokeless tobacco: Never Used  . Alcohol use 0.0 oz/week     Comment: occ  . Drug use: No  . Sexual activity: Not on file   Other Topics Concern  . Not on file   Social History Narrative   Married, wife Sharyn Lull.  Lives in a 2 story home.     #2 daughters: Mendel Ryder and Waynard Reeds   Retired Pharmacist, hospital in 2017.  Education: college          FAMILY HISTORY: Family History  Problem Relation Age of Onset  . Breast cancer Sister        x 2  . Diabetes Mother   . Kidney disease Mother   . Prostate cancer Father   . Diabetes Father   . Prostate cancer Brother   .  Diabetes Sister   . Heart disease Brother   . Colon cancer Neg Hx     Review of Systems  Constitutional: Negative.   HENT:   Negative for mouth sores, sore throat and trouble swallowing.        Conjunctival erythema. No itching or drainage.  Respiratory: Negative.   Cardiovascular: Negative.   Gastrointestinal: Negative.   Genitourinary: Negative.    Musculoskeletal: Negative.   Skin:       Dry skin rash, unchanged  Neurological: Negative.   Hematological: Negative.   Psychiatric/Behavioral: Negative.       PHYSICAL EXAMINATION  ECOG PERFORMANCE STATUS: 1 - Symptomatic but completely ambulatory  Vitals:   02/10/17 0950  BP: 120/86   Pulse: 77  Resp: 18  Temp: 98.2 F (36.8 C)  SpO2: 100%    Physical Exam  Constitutional: He is oriented to person, place, and time and well-developed, well-nourished, and in no distress. No distress.  HENT:  Head: Normocephalic and atraumatic.  Mouth/Throat: Oropharynx is clear and moist. No oropharyngeal exudate.  Eyes: Right eye exhibits no discharge. Left eye exhibits no discharge. No scleral icterus.  Conjunctiva with mild erythema. No drainage.  Neck: Normal range of motion. Neck supple.  Cardiovascular: Normal rate, regular rhythm, normal heart sounds and intact distal pulses.   Pulmonary/Chest: Effort normal and breath sounds normal. No respiratory distress. He has no wheezes.  Abdominal: Soft. Bowel sounds are normal. He exhibits no distension and no mass. There is no tenderness.  Musculoskeletal: Normal range of motion. He exhibits no edema.  Lymphadenopathy:    He has no cervical adenopathy.  Neurological: He is alert and oriented to person, place, and time. He exhibits normal muscle tone. Gait normal.  Skin: He is not diaphoretic.  dry hyperpigmentation over the trunk diffusely. Hyperpigmentation of the hands. No skin breakdown. Mild acne type rash over the face.  Psychiatric: Mood, memory, affect and judgment normal.  Vitals reviewed.   LABORATORY DATA:  CBC    Component Value Date/Time   WBC 4.6 02/10/2017 0902   WBC 13.4 (H) 07/30/2015 1844   RBC 4.15 (L) 02/10/2017 0902   RBC 3.45 (L) 07/30/2015 1844   HGB 13.9 02/10/2017 0902   HCT 42.3 02/10/2017 0902   PLT 155 02/10/2017 0902   MCV 101.9 (H) 02/10/2017 0902   MCH 33.5 (H) 02/10/2017 0902   MCH 24.9 (L) 07/30/2015 1844   MCHC 32.9 02/10/2017 0902   MCHC 30.7 07/30/2015 1844   RDW 15.8 (H) 02/10/2017 0902   LYMPHSABS 2.1 02/10/2017 0902   MONOABS 0.3 02/10/2017 0902   EOSABS 0.2 02/10/2017 0902   BASOSABS 0.0 02/10/2017 0902    CMP     Component Value Date/Time   NA 140 02/10/2017 0902   K  4.1 02/10/2017 0902   CL 109 07/30/2015 1844   CO2 23 02/10/2017 0902   GLUCOSE 109 02/10/2017 0902   BUN 10.8 02/10/2017 0902   CREATININE 1.1 02/10/2017 0902   CALCIUM 9.4 02/10/2017 0902   PROT 7.3 02/10/2017 0902   ALBUMIN 3.5 02/10/2017 0902   AST 23 02/10/2017 0902   ALT 18 02/10/2017 0902   ALKPHOS 92 02/10/2017 0902   BILITOT 0.50 02/10/2017 0902   GFRNONAA >60 07/30/2015 1844   GFRAA >60 07/30/2015 1844    RADIOGRAPHIC STUDIES:  No results found.  ASSESSMENT and THERAPY PLAN:   Rectal cancer ([place]) - adenocarcinoma His is a 66 year old male with metastatic rectal carcinoma currently on treatment with FOLFIRI/panitumumab status post  6 cycles. He continues to tolerate his treatment well. Labs have been reviewed and are adequate for treatment. Recommend he proceed with cycle 4 of his treatment as scheduled today.  He has mild conjunctival erythema likely due to panitumumab. He will contact us if this progresses.  Follow-up will be in 2 weeks for evaluation prior to cycle 8 of his chemotherapy.   Orders Placed This Encounter  Procedures  . CBC with Differential/Platelet    Standing Status:   Future    Standing Expiration Date:   02/10/2018  . Comprehensive metabolic panel    Standing Status:   Future    Standing Expiration Date:   02/10/2018  . Magnesium    Standing Status:   Future    Standing Expiration Date:   02/10/2018  . CEA    Standing Status:   Future    Standing Expiration Date:   02/10/2018    All questions were answered. The patient knows to call the clinic with any problems, questions or concerns. We can certainly see the patient much sooner if necessary.  Mikey Bussing, NP 02/10/2017

## 2017-02-10 NOTE — Patient Instructions (Signed)
[facility] Discharge Instructions for Patients Receiving Chemotherapy  Today you received the following chemotherapy agents: Vectibix, Irinotecan, Leucovorin and Adrucil   To help prevent nausea and vomiting after your treatment, we encourage you to take your nausea medication as directed.    If you develop nausea and vomiting that is not controlled by your nausea medication, call the clinic.   BELOW ARE SYMPTOMS THAT SHOULD BE REPORTED IMMEDIATELY:  *FEVER GREATER THAN 100.5 F  *CHILLS WITH OR WITHOUT FEVER  NAUSEA AND VOMITING THAT IS NOT CONTROLLED WITH YOUR NAUSEA MEDICATION  *UNUSUAL SHORTNESS OF BREATH  *UNUSUAL BRUISING OR BLEEDING  TENDERNESS IN MOUTH AND THROAT WITH OR WITHOUT PRESENCE OF ULCERS  *URINARY PROBLEMS  *BOWEL PROBLEMS  UNUSUAL RASH Items with * indicate a potential emergency and should be followed up as soon as possible.  Feel free to call the clinic you have any questions or concerns. The clinic phone number is [phone].  Please show the [facility] at check-in to the Emergency Department and triage nurse.

## 2017-02-10 NOTE — Assessment & Plan Note (Addendum)
His is a 66 year old male with metastatic rectal carcinoma currently on treatment with FOLFIRI/panitumumab status post 6 cycles. He continues to tolerate his treatment well. Labs have been reviewed and are adequate for treatment. Recommend he proceed with cycle 4 of his treatment as scheduled today.  He has mild conjunctival erythema likely due to panitumumab. He will contact us if this progresses.  Follow-up will be in 2 weeks for evaluation prior to cycle 8 of his chemotherapy.

## 2017-02-10 NOTE — Telephone Encounter (Signed)
Gave patient avs report and appointments for September and October. Patient made aware of potential conflict with [facility] appointment on 10/8.

## 2017-02-12 ENCOUNTER — Ambulatory Visit (HOSPITAL_BASED_OUTPATIENT_CLINIC_OR_DEPARTMENT_OTHER): Payer: Medicare Other

## 2017-02-12 VITALS — BP 127/87 | HR 78 | Temp 98.5°F | Resp 18

## 2017-02-12 DIAGNOSIS — C20 Malignant neoplasm of rectum: Secondary | ICD-10-CM

## 2017-02-12 DIAGNOSIS — Z452 Encounter for adjustment and management of vascular access device: Secondary | ICD-10-CM

## 2017-02-12 MED ORDER — SODIUM CHLORIDE 0.9% FLUSH
10.0000 mL | INTRAVENOUS | Status: DC | PRN
  Administered 2017-02-12: 10 mL
  Filled 2017-02-12: qty 10

## 2017-02-12 MED ORDER — HEPARIN SOD (PORK) LOCK FLUSH 100 UNIT/ML IV SOLN
500.0000 [IU] | Freq: Once | INTRAVENOUS | Status: AC | PRN
  Administered 2017-02-12: 500 [IU]
  Filled 2017-02-12: qty 5

## 2017-02-23 ENCOUNTER — Other Ambulatory Visit: Payer: Self-pay | Admitting: Oncology

## 2017-02-24 ENCOUNTER — Ambulatory Visit (HOSPITAL_BASED_OUTPATIENT_CLINIC_OR_DEPARTMENT_OTHER): Payer: Medicare Other

## 2017-02-24 ENCOUNTER — Telehealth: Payer: Self-pay | Admitting: Oncology

## 2017-02-24 ENCOUNTER — Ambulatory Visit (HOSPITAL_BASED_OUTPATIENT_CLINIC_OR_DEPARTMENT_OTHER): Payer: Medicare Other | Admitting: Nurse Practitioner

## 2017-02-24 ENCOUNTER — Other Ambulatory Visit (HOSPITAL_BASED_OUTPATIENT_CLINIC_OR_DEPARTMENT_OTHER): Payer: Medicare Other

## 2017-02-24 VITALS — BP 120/85 | HR 72 | Temp 97.7°F | Resp 18 | Ht 72.0 in | Wt 190.2 lb

## 2017-02-24 DIAGNOSIS — Z95828 Presence of other vascular implants and grafts: Secondary | ICD-10-CM

## 2017-02-24 DIAGNOSIS — L271 Localized skin eruption due to drugs and medicaments taken internally: Secondary | ICD-10-CM | POA: Diagnosis not present

## 2017-02-24 DIAGNOSIS — Z5111 Encounter for antineoplastic chemotherapy: Secondary | ICD-10-CM | POA: Diagnosis not present

## 2017-02-24 DIAGNOSIS — C20 Malignant neoplasm of rectum: Secondary | ICD-10-CM

## 2017-02-24 DIAGNOSIS — D701 Agranulocytosis secondary to cancer chemotherapy: Secondary | ICD-10-CM | POA: Diagnosis not present

## 2017-02-24 DIAGNOSIS — Z5112 Encounter for antineoplastic immunotherapy: Secondary | ICD-10-CM

## 2017-02-24 LAB — COMPREHENSIVE METABOLIC PANEL
ALT: 15 U/L (ref 0–55)
ANION GAP: 8 meq/L (ref 3–11)
AST: 21 U/L (ref 5–34)
Albumin: 3.5 g/dL (ref 3.5–5.0)
Alkaline Phosphatase: 95 U/L (ref 40–150)
BUN: 14.3 mg/dL (ref 7.0–26.0)
CHLORIDE: 109 meq/L (ref 98–109)
CO2: 25 meq/L (ref 22–29)
Calcium: 9.4 mg/dL (ref 8.4–10.4)
Creatinine: 1.1 mg/dL (ref 0.7–1.3)
EGFR: 80 mL/min/{1.73_m2} — AB (ref >90)
GLUCOSE: 124 mg/dL (ref 70–140)
Potassium: 3.8 mEq/L (ref 3.5–5.1)
SODIUM: 141 meq/L (ref 136–145)
Total Bilirubin: 0.5 mg/dL (ref 0.20–1.20)
Total Protein: 7.4 g/dL (ref 6.4–8.3)

## 2017-02-24 LAB — MAGNESIUM: Magnesium: 1.8 mg/dl (ref 1.5–2.5)

## 2017-02-24 LAB — CBC WITH DIFFERENTIAL/PLATELET
BASO%: 0.8 % (ref 0.0–2.0)
Basophils Absolute: 0 10*3/uL (ref 0.0–0.1)
EOS ABS: 0.1 10*3/uL (ref 0.0–0.5)
EOS%: 2.7 % (ref 0.0–7.0)
HEMATOCRIT: 42.8 % (ref 38.4–49.9)
HEMOGLOBIN: 14.1 g/dL (ref 13.0–17.1)
LYMPH#: 2 10*3/uL (ref 0.9–3.3)
LYMPH%: 53.4 % — ABNORMAL HIGH (ref 14.0–49.0)
MCH: 33.6 pg — ABNORMAL HIGH (ref 27.2–33.4)
MCHC: 32.9 g/dL (ref 32.0–36.0)
MCV: 101.9 fL — AB (ref 79.3–98.0)
MONO#: 0.3 10*3/uL (ref 0.1–0.9)
MONO%: 9.2 % (ref 0.0–14.0)
NEUT%: 33.9 % — ABNORMAL LOW (ref 39.0–75.0)
NEUTROS ABS: 1.3 10*3/uL — AB (ref 1.5–6.5)
PLATELETS: 145 10*3/uL (ref 140–400)
RBC: 4.2 10*6/uL (ref 4.20–5.82)
RDW: 15.2 % — AB (ref 11.0–14.6)
WBC: 3.7 10*3/uL — AB (ref 4.0–10.3)

## 2017-02-24 MED ORDER — SODIUM CHLORIDE 0.9 % IV SOLN
6.0000 mg/kg | Freq: Once | INTRAVENOUS | Status: AC
  Administered 2017-02-24: 500 mg via INTRAVENOUS
  Filled 2017-02-24: qty 20

## 2017-02-24 MED ORDER — IRINOTECAN HCL CHEMO INJECTION 100 MG/5ML
125.0000 mg/m2 | Freq: Once | INTRAVENOUS | Status: AC
  Administered 2017-02-24: 260 mg via INTRAVENOUS
  Filled 2017-02-24: qty 10

## 2017-02-24 MED ORDER — FLUOROURACIL CHEMO INJECTION 2.5 GM/50ML
300.0000 mg/m2 | Freq: Once | INTRAVENOUS | Status: AC
  Administered 2017-02-24: 600 mg via INTRAVENOUS
  Filled 2017-02-24: qty 12

## 2017-02-24 MED ORDER — ATROPINE SULFATE 1 MG/ML IJ SOLN
INTRAMUSCULAR | Status: AC
  Filled 2017-02-24: qty 1

## 2017-02-24 MED ORDER — PALONOSETRON HCL INJECTION 0.25 MG/5ML
0.2500 mg | Freq: Once | INTRAVENOUS | Status: AC
  Administered 2017-02-24: 0.25 mg via INTRAVENOUS

## 2017-02-24 MED ORDER — DEXAMETHASONE SODIUM PHOSPHATE 10 MG/ML IJ SOLN
10.0000 mg | Freq: Once | INTRAMUSCULAR | Status: AC
  Administered 2017-02-24: 10 mg via INTRAVENOUS

## 2017-02-24 MED ORDER — PALONOSETRON HCL INJECTION 0.25 MG/5ML
INTRAVENOUS | Status: AC
  Filled 2017-02-24: qty 5

## 2017-02-24 MED ORDER — DEXAMETHASONE SODIUM PHOSPHATE 10 MG/ML IJ SOLN
INTRAMUSCULAR | Status: AC
  Filled 2017-02-24: qty 1

## 2017-02-24 MED ORDER — ATROPINE SULFATE 1 MG/ML IJ SOLN
0.5000 mg | Freq: Once | INTRAMUSCULAR | Status: AC | PRN
  Administered 2017-02-24: 0.5 mg via INTRAVENOUS

## 2017-02-24 MED ORDER — SODIUM CHLORIDE 0.9 % IJ SOLN
10.0000 mL | INTRAMUSCULAR | Status: DC | PRN
  Administered 2017-02-24: 10 mL via INTRAVENOUS
  Filled 2017-02-24: qty 10

## 2017-02-24 MED ORDER — SODIUM CHLORIDE 0.9 % IV SOLN
2400.0000 mg/m2 | INTRAVENOUS | Status: DC
  Administered 2017-02-24: 4900 mg via INTRAVENOUS
  Filled 2017-02-24: qty 98

## 2017-02-24 MED ORDER — LEUCOVORIN CALCIUM INJECTION 350 MG
300.0000 mg/m2 | Freq: Once | INTRAMUSCULAR | Status: AC
  Administered 2017-02-24: 616 mg via INTRAVENOUS
  Filled 2017-02-24: qty 30.8

## 2017-02-24 MED ORDER — SODIUM CHLORIDE 0.9 % IV SOLN
Freq: Once | INTRAVENOUS | Status: AC
  Administered 2017-02-24: 10:00:00 via INTRAVENOUS

## 2017-02-24 NOTE — Patient Instructions (Signed)
Implanted Port Home Guide An implanted port is a type of central line that is placed under the skin. Central lines are used to provide IV access when treatment or nutrition needs to be given through a person's veins. Implanted ports are used for long-term IV access. An implanted port may be placed because:  You need IV medicine that would be irritating to the small veins in your hands or arms.  You need long-term IV medicines, such as antibiotics.  You need IV nutrition for a long period.  You need frequent blood draws for lab tests.  You need dialysis.  Implanted ports are usually placed in the chest area, but they can also be placed in the upper arm, the abdomen, or the leg. An implanted port has two main parts:  Reservoir. The reservoir is round and will appear as a small, raised area under your skin. The reservoir is the part where a needle is inserted to give medicines or draw blood.  Catheter. The catheter is a thin, flexible tube that extends from the reservoir. The catheter is placed into a large vein. Medicine that is inserted into the reservoir goes into the catheter and then into the vein.  How will I care for my incision site? Do not get the incision site wet. Bathe or shower as directed by your health care provider. How is my port accessed? Special steps must be taken to access the port:  Before the port is accessed, a numbing cream can be placed on the skin. This helps numb the skin over the port site.  Your health care provider uses a sterile technique to access the port. ? Your health care provider must put on a mask and sterile gloves. ? The skin over your port is cleaned carefully with an antiseptic and allowed to dry. ? The port is gently pinched between sterile gloves, and a needle is inserted into the port.  Only "non-coring" port needles should be used to access the port. Once the port is accessed, a blood return should be checked. This helps ensure that the port  is in the vein and is not clogged.  If your port needs to remain accessed for a constant infusion, a clear (transparent) bandage will be placed over the needle site. The bandage and needle will need to be changed every week, or as directed by your health care provider.  Keep the bandage covering the needle clean and dry. Do not get it wet. Follow your health care provider's instructions on how to take a shower or bath while the port is accessed.  If your port does not need to stay accessed, no bandage is needed over the port.  What is flushing? Flushing helps keep the port from getting clogged. Follow your health care provider's instructions on how and when to flush the port. Ports are usually flushed with saline solution or a medicine called heparin. The need for flushing will depend on how the port is used.  If the port is used for intermittent medicines or blood draws, the port will need to be flushed: ? After medicines have been given. ? After blood has been drawn. ? As part of routine maintenance.  If a constant infusion is running, the port may not need to be flushed.  How long will my port stay implanted? The port can stay in for as long as your health care provider thinks it is needed. When it is time for the port to come out, surgery will be   done to remove it. The procedure is similar to the one performed when the port was put in. When should I seek immediate medical care? When you have an implanted port, you should seek immediate medical care if:  You notice a bad smell coming from the incision site.  You have swelling, redness, or drainage at the incision site.  You have more swelling or pain at the port site or the surrounding area.  You have a fever that is not controlled with medicine.  This information is not intended to replace advice given to you by your health care provider. Make sure you discuss any questions you have with your health care provider. Document  Released: 05/20/2005 Document Revised: 10/26/2015 Document Reviewed: 01/25/2013 Elsevier Interactive Patient Education  2017 [employer]  

## 2017-02-24 NOTE — Patient Instructions (Signed)
[facility] Discharge Instructions for Patients Receiving Chemotherapy  Today you received the following chemotherapy agents: Irinotecan, Leucovorin, Adrucil, Vectibix  To help prevent nausea and vomiting after your treatment, we encourage you to take your nausea medication as directed.    If you develop nausea and vomiting that is not controlled by your nausea medication, call the clinic.   BELOW ARE SYMPTOMS THAT SHOULD BE REPORTED IMMEDIATELY:  *FEVER GREATER THAN 100.5 F  *CHILLS WITH OR WITHOUT FEVER  NAUSEA AND VOMITING THAT IS NOT CONTROLLED WITH YOUR NAUSEA MEDICATION  *UNUSUAL SHORTNESS OF BREATH  *UNUSUAL BRUISING OR BLEEDING  TENDERNESS IN MOUTH AND THROAT WITH OR WITHOUT PRESENCE OF ULCERS  *URINARY PROBLEMS  *BOWEL PROBLEMS  UNUSUAL RASH Items with * indicate a potential emergency and should be followed up as soon as possible.  Feel free to call the clinic should you have any questions or concerns. The clinic phone number is [phone].  Please show the [place] [facility] at check-in to the Emergency Department and triage nurse.

## 2017-02-24 NOTE — Progress Notes (Signed)
[facility] OFFICE PROGRESS NOTE   Diagnosis:  Rectal cancer  INTERVAL HISTORY:   [name] returns as scheduled. He completed cycle 8 FOLFIRI/PANITUMUMAB 02/10/2017. He denies nausea/vomiting. Her mouth sores. No diarrhea. Skin rash is better. Eyes are intermittently red. He reports a good appetite.  Objective:  Vital signs in last 24 hours:  Blood pressure 120/85, pulse 72, temperature 97.7 F (36.5 C), temperature source Oral, resp. rate 18, height 6' (1.829 m), weight 190 lb 3.2 oz (86.3 kg), SpO2 100 %.    HEENT: Bilateral conjunctival erythema. No thrush or ulcers. Resp: Distant breath sounds. No respiratory distress. Cardio: Regular rate and rhythm. GI: Abdomen soft and nontender. No hepatosplenomegaly. Vascular: No leg edema. Skin: Skin overall is dry. Acne type rash on the face. Port-A-Cath without erythema.    Lab Results:  Lab Results  Component Value Date   WBC 4.6 02/10/2017   HGB 13.9 02/10/2017   HCT 42.3 02/10/2017   MCV 101.9 (H) 02/10/2017   PLT 155 02/10/2017   NEUTROABS 2.0 02/10/2017    Imaging:  No results found.  Medications: I have reviewed the patient's current medications.  Assessment/Plan: 1. Rectal cancer, invasive adenocarcinoma confirmed at colonoscopy 05/22/2015, EUS staging 06/01/2015 revealed a uT3,N1 lesion at 7 cm from the anal verge  Staging CT abdomen/pelvis 05/15/2015 confirmed a mass at the rectosigmoid colon, a suspicious perirectal lymph node, and a 6 mm right liver lesion felt to most likely be a cyst  CT chest 05/30/2015 with indeterminate pulmonary nodules and multiple liver lesions suspicious for metastases  Ultrasound 06/13/2014 with no liver lesions identified  MRI abdomen 06/21/2015 consistent with multiple liver metastases  CT biopsy of liver lesion 06/26/2015. Pathology showed metastatic adenocarcinoma consistent with colonic primary.MSI-stable, low mutation burden, no RAS or  BRAFmutation  Cycle 1 FOLFOX 07/10/2015  Cycle 2 FOLFOX plus Avastin 07/24/2015 with Neulasta support  Cycle 3 FOLFOX plus Avastin 08/07/2015  Cycle 4 FOLFOX plus Avastin 08/21/2015  cycle 5 FOLFOX plus Avastin 09/04/2015  Restaging CTs 09/15/2015 revealed a decrease in the size of liver lesions, decreased rectal primary, stable/decreased size of tiny lung nodules  Cycle 6 FOLFOX plus Avastin 09/18/2015  Cycle 7 FOLFOX plus Avastin 10/02/2015  Cycle 8 FOLFOX plus Avastin 10/16/2015  Cycle 9 FOLFOX plus Avastin 11/06/2015  Cycle 10 FOLFOX plus Avastin 11/20/2015  CTs 12/07/2015-mild decrease in wall thickening at the rectum, decrease in tiny hepatic metastases, stable lung nodules, no evidence of progressive metastatic disease, new left lower lobe infiltrate  Treatment changed to every three-week Avastin with every other week Xeloda beginning 12/11/2015  Restaging CTs 04/12/2016-interval increase in size of adjacent right upper lobe pulmonary nodules and right lower lobe pulmonary nodule; unchanged 5 mm lesion right hepatic lobe; no additional visualized hepatic metastasis. Grossly unchanged wall thickening of the rectum.  Continuation of Xeloda every other week and every three-week AvastinXeloda placed on hold 05/06/2016 due to hand-foot syndrome  Xeloda resumed 05/28/2016 1500 mg every morning and 1000 mg every afternoon 7 days on/7 days off  CT 09/27/2016-enlargement of lung nodules and new liver lesions, stable rectal mass  Cycle 1 FOLFIRI/panitumumab 10/08/2016  Cycle 2 panitumumab 10/23/2016 (FOLFIRI held secondary to neutropenia)  Cycle 3 FOLFIRI/PANITUMUMAB 11/04/2016  Cycle 4 FOLFIRI/panitumumab 11/18/2016  Cycle 5 FOLFIRI/panitumumab 12/16/2016  Cycle 6 FOLFIRI/panitumumab 12/31/2016  CTs 01/16/2017-decreased size of pulmonary nodules and liver lesions. Decreased perirectal lymph node  Cycle 7 FOLFIRI/PANITUMUMAB 01/28/2017  Cycle 8 FOLFIRI/PANITUMUMAB  02/10/2017  Cycle 9 FOLFIRI/PANITUMUMAB 02/24/2017 2.Multiple  colon polyps on the colonoscopy 05/22/2015, tubular villous adenoma and a tubular adenoma were removed 3.Anemia-likely secondary to rectal bleeding.  4.Family history of multiple cancers including breast and prostate cancer 5.Transient right arm numbness 06/20/2015 6. Port-A-Cath placement 07/06/2015 by Dr. Marcello Moores 7. Mild neutropenia secondary to chemotherapy following cycle 1 FOLFOX. Neulasta added with cycle 2. 8. Delayed nausea following chemotherapy-emend added with cycle 3 FOLFOX 9. Early oxaliplatin neuropathy 10. 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 Xeloda resumed at a reduced dose. 11. Bilateral hand weakness. Question related to previous oxaliplatin. Referred to neurology 06/17/2016. 12. Hypertension-amlodipine started 07/08/2016 13. Rash secondary to PANITUMUMAB. He continues minocycline. 14. Neutropenia following cycle 1 FOLFIRI/PANITUMUMAB; neutropenia following cycle 4 FOLFIRI/PANITUMUMAB.  Disposition: [name] appears stable. He has completed 9 cycles of FOLFIRI/PANITUMUMAB. Plan to proceed with cycle 10 today as scheduled.  He has very mild neutropenia on labs today. He understands to contact the office with fever, chills, other signs of infection.  He will return for a follow-up visit and the next cycle of chemotherapy in 2 weeks. He will contact the office in the interim as outlined above or with any other problems.  Plan reviewed with Dr. Benay Spice.     Hunter Walker ANP/GNP-BC   02/24/2017  9:30 AM

## 2017-02-24 NOTE — Progress Notes (Signed)
Per Ned Card NP, Okay to proceed with treatment today with ANC 1.3.  Blood return noted before, during and after Adrucil push.

## 2017-02-24 NOTE — Telephone Encounter (Signed)
Appointments complete per 9/24 los. Patient will get print out in infusion.

## 2017-02-26 ENCOUNTER — Ambulatory Visit: Payer: BLUE CROSS/BLUE SHIELD | Admitting: Neurology

## 2017-02-26 ENCOUNTER — Ambulatory Visit (HOSPITAL_BASED_OUTPATIENT_CLINIC_OR_DEPARTMENT_OTHER): Payer: Medicare Other

## 2017-02-26 VITALS — BP 121/81 | HR 74 | Temp 98.4°F | Resp 18

## 2017-02-26 DIAGNOSIS — Z452 Encounter for adjustment and management of vascular access device: Secondary | ICD-10-CM | POA: Diagnosis not present

## 2017-02-26 DIAGNOSIS — C20 Malignant neoplasm of rectum: Secondary | ICD-10-CM

## 2017-02-26 MED ORDER — HEPARIN SOD (PORK) LOCK FLUSH 100 UNIT/ML IV SOLN
500.0000 [IU] | Freq: Once | INTRAVENOUS | Status: AC | PRN
  Administered 2017-02-26: 500 [IU]
  Filled 2017-02-26: qty 5

## 2017-02-26 MED ORDER — SODIUM CHLORIDE 0.9% FLUSH
10.0000 mL | INTRAVENOUS | Status: DC | PRN
  Administered 2017-02-26: 10 mL
  Filled 2017-02-26: qty 10

## 2017-03-04 ENCOUNTER — Other Ambulatory Visit: Payer: Self-pay | Admitting: Oncology

## 2017-03-10 ENCOUNTER — Telehealth: Payer: Self-pay | Admitting: Oncology

## 2017-03-10 ENCOUNTER — Ambulatory Visit: Payer: BLUE CROSS/BLUE SHIELD | Admitting: Internal Medicine

## 2017-03-10 ENCOUNTER — Ambulatory Visit (HOSPITAL_BASED_OUTPATIENT_CLINIC_OR_DEPARTMENT_OTHER): Payer: Medicare Other

## 2017-03-10 ENCOUNTER — Other Ambulatory Visit: Payer: Self-pay

## 2017-03-10 ENCOUNTER — Other Ambulatory Visit (HOSPITAL_BASED_OUTPATIENT_CLINIC_OR_DEPARTMENT_OTHER): Payer: Medicare Other

## 2017-03-10 ENCOUNTER — Ambulatory Visit (HOSPITAL_BASED_OUTPATIENT_CLINIC_OR_DEPARTMENT_OTHER): Payer: Medicare Other | Admitting: Oncology

## 2017-03-10 VITALS — BP 146/79 | HR 83 | Temp 98.4°F | Resp 20 | Ht 72.0 in | Wt 190.8 lb

## 2017-03-10 DIAGNOSIS — C20 Malignant neoplasm of rectum: Secondary | ICD-10-CM

## 2017-03-10 DIAGNOSIS — L271 Localized skin eruption due to drugs and medicaments taken internally: Secondary | ICD-10-CM

## 2017-03-10 DIAGNOSIS — Z95828 Presence of other vascular implants and grafts: Secondary | ICD-10-CM

## 2017-03-10 DIAGNOSIS — D701 Agranulocytosis secondary to cancer chemotherapy: Secondary | ICD-10-CM

## 2017-03-10 DIAGNOSIS — Z5112 Encounter for antineoplastic immunotherapy: Secondary | ICD-10-CM

## 2017-03-10 DIAGNOSIS — Z23 Encounter for immunization: Secondary | ICD-10-CM

## 2017-03-10 LAB — COMPREHENSIVE METABOLIC PANEL
ALT: 19 U/L (ref 0–55)
AST: 22 U/L (ref 5–34)
Albumin: 3.6 g/dL (ref 3.5–5.0)
Alkaline Phosphatase: 102 U/L (ref 40–150)
Anion Gap: 7 mEq/L (ref 3–11)
BILIRUBIN TOTAL: 0.54 mg/dL (ref 0.20–1.20)
BUN: 17.3 mg/dL (ref 7.0–26.0)
CO2: 26 meq/L (ref 22–29)
CREATININE: 1.2 mg/dL (ref 0.7–1.3)
Calcium: 9.1 mg/dL (ref 8.4–10.4)
Chloride: 107 mEq/L (ref 98–109)
EGFR: 75 mL/min/{1.73_m2} — AB (ref >90)
GLUCOSE: 104 mg/dL (ref 70–140)
Potassium: 4 mEq/L (ref 3.5–5.1)
Sodium: 140 mEq/L (ref 136–145)
TOTAL PROTEIN: 7.3 g/dL (ref 6.4–8.3)

## 2017-03-10 LAB — CBC WITH DIFFERENTIAL/PLATELET
BASO%: 0.8 % (ref 0.0–2.0)
Basophils Absolute: 0 10*3/uL (ref 0.0–0.1)
EOS%: 2.2 % (ref 0.0–7.0)
Eosinophils Absolute: 0.1 10*3/uL (ref 0.0–0.5)
HCT: 42.3 % (ref 38.4–49.9)
HEMOGLOBIN: 13.9 g/dL (ref 13.0–17.1)
LYMPH%: 52 % — AB (ref 14.0–49.0)
MCH: 33.5 pg — ABNORMAL HIGH (ref 27.2–33.4)
MCHC: 32.8 g/dL (ref 32.0–36.0)
MCV: 102.3 fL — ABNORMAL HIGH (ref 79.3–98.0)
MONO#: 0.3 10*3/uL (ref 0.1–0.9)
MONO%: 10.5 % (ref 0.0–14.0)
NEUT%: 34.5 % — ABNORMAL LOW (ref 39.0–75.0)
NEUTROS ABS: 1.1 10*3/uL — AB (ref 1.5–6.5)
Platelets: 161 10*3/uL (ref 140–400)
RBC: 4.13 10*6/uL — AB (ref 4.20–5.82)
RDW: 16.2 % — AB (ref 11.0–14.6)
WBC: 3.3 10*3/uL — AB (ref 4.0–10.3)
lymph#: 1.7 10*3/uL (ref 0.9–3.3)

## 2017-03-10 LAB — MAGNESIUM: Magnesium: 1.6 mg/dl (ref 1.5–2.5)

## 2017-03-10 LAB — CEA (IN HOUSE-CHCC): CEA (CHCC-In House): 1.22 ng/mL (ref 0.00–5.00)

## 2017-03-10 MED ORDER — INFLUENZA VAC SPLIT QUAD 0.5 ML IM SUSY
0.5000 mL | PREFILLED_SYRINGE | Freq: Once | INTRAMUSCULAR | Status: AC
  Administered 2017-03-10: 0.5 mL via INTRAMUSCULAR
  Filled 2017-03-10: qty 0.5

## 2017-03-10 MED ORDER — SODIUM CHLORIDE 0.9 % IV SOLN
Freq: Once | INTRAVENOUS | Status: AC
  Administered 2017-03-10: 11:00:00 via INTRAVENOUS

## 2017-03-10 MED ORDER — SODIUM CHLORIDE 0.9 % IV SOLN
6.0000 mg/kg | Freq: Once | INTRAVENOUS | Status: AC
  Administered 2017-03-10: 500 mg via INTRAVENOUS
  Filled 2017-03-10: qty 20

## 2017-03-10 MED ORDER — DEXTROSE 5 % IV SOLN
300.0000 mg/m2 | Freq: Once | INTRAVENOUS | Status: AC
  Administered 2017-03-10: 616 mg via INTRAVENOUS
  Filled 2017-03-10: qty 30.8

## 2017-03-10 MED ORDER — SODIUM CHLORIDE 0.9 % IJ SOLN
10.0000 mL | INTRAMUSCULAR | Status: DC | PRN
  Administered 2017-03-10: 10 mL via INTRAVENOUS
  Filled 2017-03-10: qty 10

## 2017-03-10 MED ORDER — SODIUM CHLORIDE 0.9 % IV SOLN
2400.0000 mg/m2 | INTRAVENOUS | Status: DC
  Administered 2017-03-10: 4900 mg via INTRAVENOUS
  Filled 2017-03-10: qty 98

## 2017-03-10 NOTE — Patient Instructions (Addendum)
[facility] Discharge Instructions for Patients Receiving Chemotherapy  Today you received the following chemotherapy agents Leucovorin; 5-Flurouracil; Vectibix  Verbally reviewed neutropenic precautions.   To help prevent nausea and vomiting after your treatment, we encourage you to take your nausea medication as directed    If you develop nausea and vomiting that is not controlled by your nausea medication, call the clinic.   BELOW ARE SYMPTOMS THAT SHOULD BE REPORTED IMMEDIATELY:  *FEVER GREATER THAN 100.5 F  *CHILLS WITH OR WITHOUT FEVER  NAUSEA AND VOMITING THAT IS NOT CONTROLLED WITH YOUR NAUSEA MEDICATION  *UNUSUAL SHORTNESS OF BREATH  *UNUSUAL BRUISING OR BLEEDING  TENDERNESS IN MOUTH AND THROAT WITH OR WITHOUT PRESENCE OF ULCERS  *URINARY PROBLEMS  *BOWEL PROBLEMS  UNUSUAL RASH Items with * indicate a potential emergency and should be followed up as soon as possible.  Feel free to call the clinic should you have any questions or concerns. The clinic phone number is [phone].  Please show the [place] at check-in to the Emergency Department and triage nurse.

## 2017-03-10 NOTE — Progress Notes (Signed)
Per Dr. Benay Spice okay to proceed with treatment today with ANC 1.1 and WBC 3.3, No pre medications Irinotecan or Adrucil bolus per Dr. Benay Spice, pt aware and verbalizes understanding.

## 2017-03-10 NOTE — Patient Instructions (Signed)
Implanted Port Home Guide An implanted port is a type of central line that is placed under the skin. Central lines are used to provide IV access when treatment or nutrition needs to be given through a person's veins. Implanted ports are used for long-term IV access. An implanted port may be placed because:  You need IV medicine that would be irritating to the small veins in your hands or arms.  You need long-term IV medicines, such as antibiotics.  You need IV nutrition for a long period.  You need frequent blood draws for lab tests.  You need dialysis.  Implanted ports are usually placed in the chest area, but they can also be placed in the upper arm, the abdomen, or the leg. An implanted port has two main parts:  Reservoir. The reservoir is round and will appear as a small, raised area under your skin. The reservoir is the part where a needle is inserted to give medicines or draw blood.  Catheter. The catheter is a thin, flexible tube that extends from the reservoir. The catheter is placed into a large vein. Medicine that is inserted into the reservoir goes into the catheter and then into the vein.  How will I care for my incision site? Do not get the incision site wet. Bathe or shower as directed by your health care provider. How is my port accessed? Special steps must be taken to access the port:  Before the port is accessed, a numbing cream can be placed on the skin. This helps numb the skin over the port site.  Your health care provider uses a sterile technique to access the port. ? Your health care provider must put on a mask and sterile gloves. ? The skin over your port is cleaned carefully with an antiseptic and allowed to dry. ? The port is gently pinched between sterile gloves, and a needle is inserted into the port.  Only "non-coring" port needles should be used to access the port. Once the port is accessed, a blood return should be checked. This helps ensure that the port  is in the vein and is not clogged.  If your port needs to remain accessed for a constant infusion, a clear (transparent) bandage will be placed over the needle site. The bandage and needle will need to be changed every week, or as directed by your health care provider.  Keep the bandage covering the needle clean and dry. Do not get it wet. Follow your health care provider's instructions on how to take a shower or bath while the port is accessed.  If your port does not need to stay accessed, no bandage is needed over the port.  What is flushing? Flushing helps keep the port from getting clogged. Follow your health care provider's instructions on how and when to flush the port. Ports are usually flushed with saline solution or a medicine called heparin. The need for flushing will depend on how the port is used.  If the port is used for intermittent medicines or blood draws, the port will need to be flushed: ? After medicines have been given. ? After blood has been drawn. ? As part of routine maintenance.  If a constant infusion is running, the port may not need to be flushed.  How long will my port stay implanted? The port can stay in for as long as your health care provider thinks it is needed. When it is time for the port to come out, surgery will be   done to remove it. The procedure is similar to the one performed when the port was put in. When should I seek immediate medical care? When you have an implanted port, you should seek immediate medical care if:  You notice a bad smell coming from the incision site.  You have swelling, redness, or drainage at the incision site.  You have more swelling or pain at the port site or the surrounding area.  You have a fever that is not controlled with medicine.  This information is not intended to replace advice given to you by your health care provider. Make sure you discuss any questions you have with your health care provider. Document  Released: 05/20/2005 Document Revised: 10/26/2015 Document Reviewed: 01/25/2013 Elsevier Interactive Patient Education  2017 [employer]  

## 2017-03-10 NOTE — Telephone Encounter (Signed)
Scheduled appts per 10/8 sch msg. Patient is getting an updated schedule in infusion.

## 2017-03-10 NOTE — Progress Notes (Signed)
[name] OFFICE PROGRESS NOTE   Diagnosis: Rectal cancer  INTERVAL HISTORY:   Hunter Walker returns as scheduled. He completed another cycle of FOLFIRI/panitumumab 02/24/2017. No nausea or diarrhea. Neuropathy symptoms have improved. The skin rash is drying. No fever. Good appetite. He reports fatigue after a busy weekend. He reports having prolonged arm pain after receiving Neulasta in the past.  Objective:  Vital signs in last 24 hours:  Blood pressure (!) 146/79, pulse 83, temperature 98.4 F (36.9 C), temperature source Oral, resp. rate 20, height 6' (1.829 m), weight 190 lb 12.8 oz (86.5 kg), SpO2 100 %.    HEENT: No thrush or ulcers Resp: Lungs clear bilaterally Cardio: Regular rate and rhythm GI: No hepatosplenomegaly, nontender Vascular: No leg edema  Skin: Dry hyperpigmented rash over the trunk with superficial flaking. Hyperpigmentation of the hands without skin breakdown.   Portacath/PICC-without erythema  Lab Results:  Lab Results  Component Value Date   WBC 3.3 (L) 03/10/2017   HGB 13.9 03/10/2017   HCT 42.3 03/10/2017   MCV 102.3 (H) 03/10/2017   PLT 161 03/10/2017   NEUTROABS 1.1 (L) 03/10/2017    CMP     Component Value Date/Time   NA 141 02/24/2017 0840   K 3.8 02/24/2017 0840   CL 109 07/30/2015 1844   CO2 25 02/24/2017 0840   GLUCOSE 124 02/24/2017 0840   BUN 14.3 02/24/2017 0840   CREATININE 1.1 02/24/2017 0840   CALCIUM 9.4 02/24/2017 0840   PROT 7.4 02/24/2017 0840   ALBUMIN 3.5 02/24/2017 0840   AST 21 02/24/2017 0840   ALT 15 02/24/2017 0840   ALKPHOS 95 02/24/2017 0840   BILITOT 0.50 02/24/2017 0840   GFRNONAA >60 07/30/2015 1844   GFRAA >60 07/30/2015 1844    Lab Results  Component Value Date   CEA1 1.12 01/28/2017     Medications: I have reviewed the patient's current medications.  Assessment/Plan: 1. Rectal cancer, invasive adenocarcinoma confirmed at colonoscopy 05/22/2015, EUS staging 06/01/2015  revealed a uT3,N1 lesion at 7 cm from the anal verge  Staging CT abdomen/pelvis 05/15/2015 confirmed a mass at the rectosigmoid colon, a suspicious perirectal lymph node, and a 6 mm right liver lesion felt to most likely be a cyst  CT chest 05/30/2015 with indeterminate pulmonary nodules and multiple liver lesions suspicious for metastases  Ultrasound 06/13/2014 with no liver lesions identified  MRI abdomen 06/21/2015 consistent with multiple liver metastases  CT biopsy of liver lesion 06/26/2015. Pathology showed metastatic adenocarcinoma consistent with colonic primary.MSI-stable, low mutation burden, no RAS or BRAFmutation  Cycle 1 FOLFOX 07/10/2015  Cycle 2 FOLFOX plus Avastin 07/24/2015 with Neulasta support  Cycle 3 FOLFOX plus Avastin 08/07/2015  Cycle 4 FOLFOX plus Avastin 08/21/2015  cycle 5 FOLFOX plus Avastin 09/04/2015  Restaging CTs 09/15/2015 revealed a decrease in the size of liver lesions, decreased rectal primary, stable/decreased size of tiny lung nodules  Cycle 6 FOLFOX plus Avastin 09/18/2015  Cycle 7 FOLFOX plus Avastin 10/02/2015  Cycle 8 FOLFOX plus Avastin 10/16/2015  Cycle 9 FOLFOX plus Avastin 11/06/2015  Cycle 10 FOLFOX plus Avastin 11/20/2015  CTs 12/07/2015-mild decrease in wall thickening at the rectum, decrease in tiny hepatic metastases, stable lung nodules, no evidence of progressive metastatic disease, new left lower lobe infiltrate  Treatment changed to every three-week Avastin with every other week Xeloda beginning 12/11/2015  Restaging CTs 04/12/2016-interval increase in size of adjacent right upper lobe pulmonary nodules and right lower lobe pulmonary nodule; unchanged 5 mm lesion   right hepatic lobe; no additional visualized hepatic metastasis. Grossly unchanged wall thickening of the rectum.  Continuation of Xeloda every other week and every three-week AvastinXeloda placed on hold 05/06/2016 due to hand-foot syndrome  Xeloda resumed  05/28/2016 1500 mg every morning and 1000 mg every afternoon 7 days on/7 days off  CT 09/27/2016-enlargement of lung nodules and new liver lesions, stable rectal mass  Cycle 1 FOLFIRI/panitumumab 10/08/2016  Cycle 2 panitumumab 10/23/2016 (FOLFIRI held secondary to neutropenia)  Cycle 3 FOLFIRI/PANITUMUMAB 11/04/2016  Cycle 4 FOLFIRI/panitumumab 11/18/2016  Cycle 5 FOLFIRI/panitumumab 12/16/2016  Cycle 6 FOLFIRI/panitumumab 12/31/2016  CTs 01/16/2017-decreased size of pulmonary nodules and liver lesions. Decreased perirectal lymph node  Cycle 7 FOLFIRI/PANITUMUMAB 01/28/2017  Cycle 8 FOLFIRI/PANITUMUMAB 02/10/2017  Cycle 9 FOLFIRI/PANITUMUMAB 02/24/2017  Cycle 10 FOLFIRI/panitumumab 03/10/2017 (irinotecan held secondary to neutropenia)  2.Multiple colon polyps on the colonoscopy 05/22/2015, tubular villous adenoma and a tubular adenoma were removed 3.Anemia-likely secondary to rectal bleeding.  4.Family history of multiple cancers including breast and prostate cancer 5.Transient right arm numbness 06/20/2015 6. Port-A-Cath placement 07/06/2015 by Dr. Marcello Moores 7. Mild neutropenia secondary to chemotherapy following cycle 1 FOLFOX. Neulasta added with cycle 2. 8. Delayed nausea following chemotherapy-emend added with cycle 3 FOLFOX 9. Early oxaliplatin neuropathy 10. 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 Xeloda resumed at a reduced dose. 11. Bilateral hand weakness. Question related to previous oxaliplatin. Referred to neurology 06/17/2016. 12. Hypertension-amlodipine started 07/08/2016 13. Rash secondary to PANITUMUMAB. He continues minocycline. 14. Neutropenia following cycle 1 FOLFIRI/PANITUMUMAB; neutropenia following cycle 4 FOLFIRI/PANITUMUMAB.    Disposition:  Hunter Walker appears unchanged. He is tolerating the chemotherapy well. He will complete another cycle of FOLFIRI/panitumumab today. We will hold irinotecan secondary to  neutropenia. He will return for an office visit and chemotherapy in 2 weeks.  Hunter Walker received an influenza vaccine today.  We will plan for a restaging CT evaluation after cycle 12 FOLFIRI/panitumumab.  25 minutes were spent with the patient today. The majority of the time was used for counseling and coordination of care.  Donneta Romberg, MD  03/10/2017  10:37 AM

## 2017-03-12 ENCOUNTER — Ambulatory Visit (HOSPITAL_BASED_OUTPATIENT_CLINIC_OR_DEPARTMENT_OTHER): Payer: Medicare Other

## 2017-03-12 VITALS — BP 124/85 | HR 77 | Temp 98.1°F | Resp 18

## 2017-03-12 DIAGNOSIS — C20 Malignant neoplasm of rectum: Secondary | ICD-10-CM

## 2017-03-12 MED ORDER — SODIUM CHLORIDE 0.9% FLUSH
10.0000 mL | INTRAVENOUS | Status: DC | PRN
  Administered 2017-03-12: 10 mL
  Filled 2017-03-12: qty 10

## 2017-03-12 MED ORDER — HEPARIN SOD (PORK) LOCK FLUSH 100 UNIT/ML IV SOLN
500.0000 [IU] | Freq: Once | INTRAVENOUS | Status: AC | PRN
  Administered 2017-03-12: 500 [IU]
  Filled 2017-03-12: qty 5

## 2017-03-18 ENCOUNTER — Ambulatory Visit (INDEPENDENT_AMBULATORY_CARE_PROVIDER_SITE_OTHER): Payer: Medicare Other | Admitting: Internal Medicine

## 2017-03-18 ENCOUNTER — Encounter: Payer: Self-pay | Admitting: Internal Medicine

## 2017-03-18 DIAGNOSIS — I10 Essential (primary) hypertension: Secondary | ICD-10-CM | POA: Diagnosis not present

## 2017-03-18 NOTE — Patient Instructions (Signed)
We are not making any changes today.    

## 2017-03-18 NOTE — Progress Notes (Signed)
   Subjective:    Patient ID: Hunter Walker, male    DOB: 1950-09-30, 66 y.o.   MRN: [id number]  HPI The patient is a 66 YO man coming in for follow up of his blood pressure. He is currently undergoing chemotherapy for his rectal cancer which is metastatic. He has been having good BP control. Denies headaches, chest pains. No lightheadedness or dizziness. No side effects from his amlodipine.   Review of Systems  Constitutional: Positive for fatigue. Negative for activity change, appetite change, chills, fever and unexpected weight change.  Respiratory: Negative for cough, chest tightness and shortness of breath.   Cardiovascular: Negative for chest pain, palpitations and leg swelling.  Gastrointestinal: Negative for abdominal distention, abdominal pain, constipation, diarrhea, nausea and vomiting.  Musculoskeletal: Negative.   Skin: Negative.   Neurological: Negative.   Psychiatric/Behavioral: Negative.       Objective:   Physical Exam  Constitutional: He is oriented to person, place, and time. He appears well-developed and well-nourished.  HENT:  Head: Normocephalic and atraumatic.  Eyes: EOM are normal.  Neck: Normal range of motion.  Cardiovascular: Normal rate and regular rhythm.   Pulmonary/Chest: Effort normal and breath sounds normal. No respiratory distress. He has no wheezes. He has no rales.  Abdominal: Soft. Bowel sounds are normal. He exhibits no distension. There is no tenderness. There is no rebound.  Musculoskeletal: He exhibits no edema.  Neurological: He is alert and oriented to person, place, and time. Coordination normal.  Skin: Skin is warm and dry.   Vitals:   03/18/17 0950  BP: 100/70  Pulse: 77  Temp: 97.9 F (36.6 C)  TempSrc: Oral  SpO2: 97%  Weight: 189 lb (85.7 kg)  Height: 6' (1.829 m)      Assessment & Plan:

## 2017-03-20 DIAGNOSIS — I10 Essential (primary) hypertension: Secondary | ICD-10-CM | POA: Insufficient documentation

## 2017-03-20 NOTE — Assessment & Plan Note (Signed)
Taking amlodipine 5 mg daily. BP is normal, no symptoms of pre-syncope or lightheadedness. If he develops these symptoms he is asked to stop amlodipine and call us.

## 2017-03-21 ENCOUNTER — Other Ambulatory Visit: Payer: Self-pay | Admitting: Oncology

## 2017-03-24 ENCOUNTER — Other Ambulatory Visit (HOSPITAL_BASED_OUTPATIENT_CLINIC_OR_DEPARTMENT_OTHER): Payer: Medicare Other

## 2017-03-24 ENCOUNTER — Ambulatory Visit (HOSPITAL_BASED_OUTPATIENT_CLINIC_OR_DEPARTMENT_OTHER): Payer: Medicare Other

## 2017-03-24 ENCOUNTER — Ambulatory Visit (HOSPITAL_BASED_OUTPATIENT_CLINIC_OR_DEPARTMENT_OTHER): Payer: Medicare Other | Admitting: Oncology

## 2017-03-24 VITALS — BP 118/72 | HR 80 | Temp 97.6°F | Resp 18 | Ht 72.0 in | Wt 189.7 lb

## 2017-03-24 DIAGNOSIS — Z5112 Encounter for antineoplastic immunotherapy: Secondary | ICD-10-CM | POA: Diagnosis present

## 2017-03-24 DIAGNOSIS — G620 Drug-induced polyneuropathy: Secondary | ICD-10-CM

## 2017-03-24 DIAGNOSIS — C20 Malignant neoplasm of rectum: Secondary | ICD-10-CM

## 2017-03-24 DIAGNOSIS — Z5111 Encounter for antineoplastic chemotherapy: Secondary | ICD-10-CM | POA: Diagnosis not present

## 2017-03-24 DIAGNOSIS — C787 Secondary malignant neoplasm of liver and intrahepatic bile duct: Secondary | ICD-10-CM

## 2017-03-24 DIAGNOSIS — Z95828 Presence of other vascular implants and grafts: Secondary | ICD-10-CM

## 2017-03-24 DIAGNOSIS — L271 Localized skin eruption due to drugs and medicaments taken internally: Secondary | ICD-10-CM | POA: Diagnosis not present

## 2017-03-24 LAB — CBC WITH DIFFERENTIAL/PLATELET
BASO%: 0.9 % (ref 0.0–2.0)
BASOS ABS: 0 10*3/uL (ref 0.0–0.1)
EOS%: 2.5 % (ref 0.0–7.0)
Eosinophils Absolute: 0.1 10*3/uL (ref 0.0–0.5)
HCT: 45.8 % (ref 38.4–49.9)
HGB: 15 g/dL (ref 13.0–17.1)
LYMPH%: 34.8 % (ref 14.0–49.0)
MCH: 33.6 pg — AB (ref 27.2–33.4)
MCHC: 32.8 g/dL (ref 32.0–36.0)
MCV: 102.4 fL — ABNORMAL HIGH (ref 79.3–98.0)
MONO#: 0.5 10*3/uL (ref 0.1–0.9)
MONO%: 9.1 % (ref 0.0–14.0)
NEUT#: 2.9 10*3/uL (ref 1.5–6.5)
NEUT%: 52.7 % (ref 39.0–75.0)
Platelets: 180 10*3/uL (ref 140–400)
RBC: 4.47 10*6/uL (ref 4.20–5.82)
RDW: 16.2 % — AB (ref 11.0–14.6)
WBC: 5.4 10*3/uL (ref 4.0–10.3)
lymph#: 1.9 10*3/uL (ref 0.9–3.3)

## 2017-03-24 LAB — COMPREHENSIVE METABOLIC PANEL
ALT: 21 U/L (ref 0–55)
AST: 23 U/L (ref 5–34)
Albumin: 3.6 g/dL (ref 3.5–5.0)
Alkaline Phosphatase: 99 U/L (ref 40–150)
Anion Gap: 9 mEq/L (ref 3–11)
BUN: 12.8 mg/dL (ref 7.0–26.0)
CHLORIDE: 106 meq/L (ref 98–109)
CO2: 25 mEq/L (ref 22–29)
Calcium: 9.1 mg/dL (ref 8.4–10.4)
Creatinine: 1.2 mg/dL (ref 0.7–1.3)
EGFR: >60 mL/min/{1.73_m2} (ref >60)
GLUCOSE: 115 mg/dL (ref 70–140)
POTASSIUM: 3.9 meq/L (ref 3.5–5.1)
SODIUM: 141 meq/L (ref 136–145)
Total Bilirubin: 0.47 mg/dL (ref 0.20–1.20)
Total Protein: 7.3 g/dL (ref 6.4–8.3)

## 2017-03-24 LAB — MAGNESIUM: Magnesium: 1.7 mg/dl (ref 1.5–2.5)

## 2017-03-24 MED ORDER — FLUOROURACIL CHEMO INJECTION 2.5 GM/50ML
300.0000 mg/m2 | Freq: Once | INTRAVENOUS | Status: AC
  Administered 2017-03-24: 600 mg via INTRAVENOUS
  Filled 2017-03-24: qty 12

## 2017-03-24 MED ORDER — ATROPINE SULFATE 1 MG/ML IJ SOLN
INTRAMUSCULAR | Status: AC
  Filled 2017-03-24: qty 1

## 2017-03-24 MED ORDER — SODIUM CHLORIDE 0.9 % IJ SOLN
10.0000 mL | INTRAMUSCULAR | Status: AC | PRN
Start: 2017-03-24 — End: ?
  Administered 2017-03-24: 10 mL via INTRAVENOUS
  Filled 2017-03-24: qty 10

## 2017-03-24 MED ORDER — SODIUM CHLORIDE 0.9 % IV SOLN
2450.0000 mg/m2 | INTRAVENOUS | Status: DC
  Administered 2017-03-24: 5000 mg via INTRAVENOUS
  Filled 2017-03-24: qty 100

## 2017-03-24 MED ORDER — IRINOTECAN HCL CHEMO INJECTION 100 MG/5ML
125.0000 mg/m2 | Freq: Once | INTRAVENOUS | Status: AC
  Administered 2017-03-24: 260 mg via INTRAVENOUS
  Filled 2017-03-24: qty 13

## 2017-03-24 MED ORDER — SODIUM CHLORIDE 0.9 % IV SOLN
Freq: Once | INTRAVENOUS | Status: AC
  Administered 2017-03-24: 11:00:00 via INTRAVENOUS

## 2017-03-24 MED ORDER — DEXAMETHASONE SODIUM PHOSPHATE 10 MG/ML IJ SOLN
INTRAMUSCULAR | Status: AC
  Filled 2017-03-24: qty 1

## 2017-03-24 MED ORDER — PALONOSETRON HCL INJECTION 0.25 MG/5ML
0.2500 mg | Freq: Once | INTRAVENOUS | Status: AC
  Administered 2017-03-24: 0.25 mg via INTRAVENOUS

## 2017-03-24 MED ORDER — SODIUM CHLORIDE 0.9 % IV SOLN: Freq: Once | INTRAVENOUS | Status: DC

## 2017-03-24 MED ORDER — SODIUM CHLORIDE 0.9 % IV SOLN
6.0000 mg/kg | Freq: Once | INTRAVENOUS | Status: AC
  Administered 2017-03-24: 500 mg via INTRAVENOUS
  Filled 2017-03-24: qty 5

## 2017-03-24 MED ORDER — PALONOSETRON HCL INJECTION 0.25 MG/5ML
INTRAVENOUS | Status: AC
  Filled 2017-03-24: qty 5

## 2017-03-24 MED ORDER — ATROPINE SULFATE 1 MG/ML IJ SOLN
0.5000 mg | Freq: Once | INTRAMUSCULAR | Status: AC | PRN
  Administered 2017-03-24: 0.5 mg via INTRAVENOUS

## 2017-03-24 MED ORDER — LEUCOVORIN CALCIUM INJECTION 350 MG
300.0000 mg/m2 | Freq: Once | INTRAVENOUS | Status: AC
  Administered 2017-03-24: 616 mg via INTRAVENOUS
  Filled 2017-03-24: qty 30.8

## 2017-03-24 MED ORDER — DEXAMETHASONE SODIUM PHOSPHATE 10 MG/ML IJ SOLN
10.0000 mg | Freq: Once | INTRAMUSCULAR | Status: AC
  Administered 2017-03-24: 10 mg via INTRAVENOUS

## 2017-03-24 NOTE — Progress Notes (Signed)
[place] OFFICE PROGRESS NOTE   Diagnosis: Rectal cancer  INTERVAL HISTORY:   Mr. Renovato returns as scheduled. He completed a cycle of 5-FU/panitumumab on 03/10/2017. No nausea or diarrhea. Neuropathy symptoms have improved. He continues to have dryness of the trunk and extremities. He has intermittent conjunctival erythema. This is not painful. No vision change.  Objective:  Vital signs in last 24 hours:  Blood pressure 118/72, pulse 80, temperature 97.6 F (36.4 C), temperature source Oral, resp. rate 18, height 6' (1.829 m), weight 189 lb 11.2 oz (86 kg), SpO2 100 %.    HEENT: No thrush or ulcers, right greater than left conjunctival erythema Resp: Lungs clear bilaterally Cardio: Regular rate and rhythm GI: No hepatosplenomegaly, nontender Vascular: No leg edema  Skin: Hyperpigmentation of the hands and feet with dry desquamation over the trunk and extremities.   Portacath/PICC-without erythema  Lab Results:  Lab Results  Component Value Date   WBC 5.4 03/24/2017   HGB 15.0 03/24/2017   HCT 45.8 03/24/2017   MCV 102.4 (H) 03/24/2017   PLT 180 03/24/2017   NEUTROABS 2.9 03/24/2017    CMP     Component Value Date/Time   NA 141 03/24/2017 0841   K 3.9 03/24/2017 0841   CL 109 07/30/2015 1844   CO2 25 03/24/2017 0841   GLUCOSE 115 03/24/2017 0841   BUN 12.8 03/24/2017 0841   CREATININE 1.2 03/24/2017 0841   CALCIUM 9.1 03/24/2017 0841   PROT 7.3 03/24/2017 0841   ALBUMIN 3.6 03/24/2017 0841   AST 23 03/24/2017 0841   ALT 21 03/24/2017 0841   ALKPHOS 99 03/24/2017 0841   BILITOT 0.47 03/24/2017 0841   GFRNONAA >60 07/30/2015 1844   GFRAA >60 07/30/2015 1844    Lab Results  Component Value Date   CEA1 1.22 03/10/2017     Medications: I have reviewed the patient's current medications.  Assessment/Plan: 1. Rectal cancer, invasive adenocarcinoma confirmed at colonoscopy 05/22/2015, EUS staging 06/01/2015 revealed a uT3,N1 lesion at 7  cm from the anal verge  Staging CT abdomen/pelvis 05/15/2015 confirmed a mass at the rectosigmoid colon, a suspicious perirectal lymph node, and a 6 mm right liver lesion felt to most likely be a cyst  CT chest 05/30/2015 with indeterminate pulmonary nodules and multiple liver lesions suspicious for metastases  Ultrasound 06/13/2014 with no liver lesions identified  MRI abdomen 06/21/2015 consistent with multiple liver metastases  CT biopsy of liver lesion 06/26/2015. Pathology showed metastatic adenocarcinoma consistent with colonic primary.MSI-stable, low mutation burden, no RAS or BRAFmutation  Cycle 1 FOLFOX 07/10/2015  Cycle 2 FOLFOX plus Avastin 07/24/2015 with Neulasta support  Cycle 3 FOLFOX plus Avastin 08/07/2015  Cycle 4 FOLFOX plus Avastin 08/21/2015  cycle 5 FOLFOX plus Avastin 09/04/2015  Restaging CTs 09/15/2015 revealed a decrease in the size of liver lesions, decreased rectal primary, stable/decreased size of tiny lung nodules  Cycle 6 FOLFOX plus Avastin 09/18/2015  Cycle 7 FOLFOX plus Avastin 10/02/2015  Cycle 8 FOLFOX plus Avastin 10/16/2015  Cycle 9 FOLFOX plus Avastin 11/06/2015  Cycle 10 FOLFOX plus Avastin 11/20/2015  CTs 12/07/2015-mild decrease in wall thickening at the rectum, decrease in tiny hepatic metastases, stable lung nodules, no evidence of progressive metastatic disease, new left lower lobe infiltrate  Treatment changed to every three-week Avastin with every other week Xeloda beginning 12/11/2015  Restaging CTs 04/12/2016-interval increase in size of adjacent right upper lobe pulmonary nodules and right lower lobe pulmonary nodule; unchanged 5 mm lesion right hepatic lobe; no  additional visualized hepatic metastasis. Grossly unchanged wall thickening of the rectum.  Continuation of Xeloda every other week and every three-week AvastinXeloda placed on hold 05/06/2016 due to hand-foot syndrome  Xeloda resumed 05/28/2016 1500 mg every  morning and 1000 mg every afternoon 7 days on/7 days off  CT 09/27/2016-enlargement of lung nodules and new liver lesions, stable rectal mass  Cycle 1 FOLFIRI/panitumumab 10/08/2016  Cycle 2 panitumumab 10/23/2016 (FOLFIRI held secondary to neutropenia)  Cycle 3 FOLFIRI/PANITUMUMAB 11/04/2016  Cycle 4 FOLFIRI/panitumumab 11/18/2016  Cycle 5 FOLFIRI/panitumumab 12/16/2016  Cycle 6 FOLFIRI/panitumumab 12/31/2016  CTs 01/16/2017-decreased size of pulmonary nodules and liver lesions. Decreased perirectal lymph node  Cycle 7 FOLFIRI/PANITUMUMAB 01/28/2017  Cycle 8FOLFIRI/PANITUMUMAB 02/10/2017  Cycle 9 FOLFIRI/PANITUMUMAB 02/24/2017  Cycle 10 FOLFIRI/panitumumab 03/10/2017 (irinotecan held secondary to neutropenia)   Cycle 11 FOLFIRI/panitumumab 03/24/2017 2.Multiple colon polyps on the colonoscopy 05/22/2015, tubular villous adenoma and a tubular adenoma were removed 3.Anemia-likely secondary to rectal bleeding.  4.Family history of multiple cancers including breast and prostate cancer 5.Transient right arm numbness 06/20/2015 6. Port-A-Cath placement 07/06/2015 by Dr. Marcello Moores 7. Mild neutropenia secondary to chemotherapy following cycle 1 FOLFOX. Neulasta added with cycle 2. 8. Delayed nausea following chemotherapy-emend added with cycle 3 FOLFOX 9. Early oxaliplatin neuropathy 10. 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 Xeloda resumed at a reduced dose. 11. Bilateral hand weakness. Question related to previous oxaliplatin. Referred to neurology 06/17/2016. 12. Hypertension-amlodipine started 07/08/2016 13. Rash secondary to PANITUMUMAB. He continues minocycline. 14. Neutropenia following cycle 1 FOLFIRI/PANITUMUMAB; neutropenia following cycle 4 FOLFIRI/PANITUMUMAB.    Disposition:  Hunter Walker appears stable. The white count has recovered. He will complete another treatment with FOLFIRI/panitumumab today. He will return for an office  visit and treatment in 2 weeks. The plan is to schedule a restaging CT evaluation after cycle 12.  15 minutes were spent with the patient today. The majority of the time was used for counseling and coordination of care.  Donneta Romberg, MD  03/24/2017  10:17 AM

## 2017-03-24 NOTE — Patient Instructions (Signed)
[facility] Discharge Instructions for Patients Receiving Chemotherapy  Today you received the following chemotherapy agents Vectibix, Irinotecan, Leucovorin, 5FU.   To help prevent nausea and vomiting after your treatment, we encourage you to take your nausea medication as prescribed.   If you develop nausea and vomiting that is not controlled by your nausea medication, call the clinic.   BELOW ARE SYMPTOMS THAT SHOULD BE REPORTED IMMEDIATELY:  *FEVER GREATER THAN 100.5 F  *CHILLS WITH OR WITHOUT FEVER  NAUSEA AND VOMITING THAT IS NOT CONTROLLED WITH YOUR NAUSEA MEDICATION  *UNUSUAL SHORTNESS OF BREATH  *UNUSUAL BRUISING OR BLEEDING  TENDERNESS IN MOUTH AND THROAT WITH OR WITHOUT PRESENCE OF ULCERS  *URINARY PROBLEMS  *BOWEL PROBLEMS  UNUSUAL RASH Items with * indicate a potential emergency and should be followed up as soon as possible.  Feel free to call the clinic should you have any questions or concerns. The clinic phone number is [phone].  Please show the [place] at check-in to the Emergency Department and triage nurse.

## 2017-03-24 NOTE — Patient Instructions (Signed)
Implanted Port Home Guide An implanted port is a type of central line that is placed under the skin. Central lines are used to provide IV access when treatment or nutrition needs to be given through a person's veins. Implanted ports are used for long-term IV access. An implanted port may be placed because:  You need IV medicine that would be irritating to the small veins in your hands or arms.  You need long-term IV medicines, such as antibiotics.  You need IV nutrition for a long period.  You need frequent blood draws for lab tests.  You need dialysis.  Implanted ports are usually placed in the chest area, but they can also be placed in the upper arm, the abdomen, or the leg. An implanted port has two main parts:  Reservoir. The reservoir is round and will appear as a small, raised area under your skin. The reservoir is the part where a needle is inserted to give medicines or draw blood.  Catheter. The catheter is a thin, flexible tube that extends from the reservoir. The catheter is placed into a large vein. Medicine that is inserted into the reservoir goes into the catheter and then into the vein.  How will I care for my incision site? Do not get the incision site wet. Bathe or shower as directed by your health care provider. How is my port accessed? Special steps must be taken to access the port:  Before the port is accessed, a numbing cream can be placed on the skin. This helps numb the skin over the port site.  Your health care provider uses a sterile technique to access the port. ? Your health care provider must put on a mask and sterile gloves. ? The skin over your port is cleaned carefully with an antiseptic and allowed to dry. ? The port is gently pinched between sterile gloves, and a needle is inserted into the port.  Only "non-coring" port needles should be used to access the port. Once the port is accessed, a blood return should be checked. This helps ensure that the port  is in the vein and is not clogged.  If your port needs to remain accessed for a constant infusion, a clear (transparent) bandage will be placed over the needle site. The bandage and needle will need to be changed every week, or as directed by your health care provider.  Keep the bandage covering the needle clean and dry. Do not get it wet. Follow your health care provider's instructions on how to take a shower or bath while the port is accessed.  If your port does not need to stay accessed, no bandage is needed over the port.  What is flushing? Flushing helps keep the port from getting clogged. Follow your health care provider's instructions on how and when to flush the port. Ports are usually flushed with saline solution or a medicine called heparin. The need for flushing will depend on how the port is used.  If the port is used for intermittent medicines or blood draws, the port will need to be flushed: ? After medicines have been given. ? After blood has been drawn. ? As part of routine maintenance.  If a constant infusion is running, the port may not need to be flushed.  How long will my port stay implanted? The port can stay in for as long as your health care provider thinks it is needed. When it is time for the port to come out, surgery will be   done to remove it. The procedure is similar to the one performed when the port was put in. When should I seek immediate medical care? When you have an implanted port, you should seek immediate medical care if:  You notice a bad smell coming from the incision site.  You have swelling, redness, or drainage at the incision site.  You have more swelling or pain at the port site or the surrounding area.  You have a fever that is not controlled with medicine.  This information is not intended to replace advice given to you by your health care provider. Make sure you discuss any questions you have with your health care provider. Document  Released: 05/20/2005 Document Revised: 10/26/2015 Document Reviewed: 01/25/2013 Elsevier Interactive Patient Education  2017 [employer]  

## 2017-03-26 ENCOUNTER — Ambulatory Visit (HOSPITAL_BASED_OUTPATIENT_CLINIC_OR_DEPARTMENT_OTHER): Payer: Medicare Other

## 2017-03-26 VITALS — BP 131/80 | HR 75 | Temp 98.1°F | Resp 17

## 2017-03-26 DIAGNOSIS — C20 Malignant neoplasm of rectum: Secondary | ICD-10-CM | POA: Diagnosis not present

## 2017-03-26 MED ORDER — SODIUM CHLORIDE 0.9% FLUSH
10.0000 mL | INTRAVENOUS | Status: DC | PRN
  Administered 2017-03-26: 10 mL
  Filled 2017-03-26: qty 10

## 2017-03-26 MED ORDER — HEPARIN SOD (PORK) LOCK FLUSH 100 UNIT/ML IV SOLN
500.0000 [IU] | Freq: Once | INTRAVENOUS | Status: AC | PRN
  Administered 2017-03-26: 500 [IU]
  Filled 2017-03-26: qty 5

## 2017-03-26 NOTE — Patient Instructions (Signed)
Implanted Port Home Guide An implanted port is a type of central line that is placed under the skin. Central lines are used to provide IV access when treatment or nutrition needs to be given through a person's veins. Implanted ports are used for long-term IV access. An implanted port may be placed because:  You need IV medicine that would be irritating to the small veins in your hands or arms.  You need long-term IV medicines, such as antibiotics.  You need IV nutrition for a long period.  You need frequent blood draws for lab tests.  You need dialysis.  Implanted ports are usually placed in the chest area, but they can also be placed in the upper arm, the abdomen, or the leg. An implanted port has two main parts:  Reservoir. The reservoir is round and will appear as a small, raised area under your skin. The reservoir is the part where a needle is inserted to give medicines or draw blood.  Catheter. The catheter is a thin, flexible tube that extends from the reservoir. The catheter is placed into a large vein. Medicine that is inserted into the reservoir goes into the catheter and then into the vein.  How will I care for my incision site? Do not get the incision site wet. Bathe or shower as directed by your health care provider. How is my port accessed? Special steps must be taken to access the port:  Before the port is accessed, a numbing cream can be placed on the skin. This helps numb the skin over the port site.  Your health care provider uses a sterile technique to access the port. ? Your health care provider must put on a mask and sterile gloves. ? The skin over your port is cleaned carefully with an antiseptic and allowed to dry. ? The port is gently pinched between sterile gloves, and a needle is inserted into the port.  Only "non-coring" port needles should be used to access the port. Once the port is accessed, a blood return should be checked. This helps ensure that the port  is in the vein and is not clogged.  If your port needs to remain accessed for a constant infusion, a clear (transparent) bandage will be placed over the needle site. The bandage and needle will need to be changed every week, or as directed by your health care provider.  Keep the bandage covering the needle clean and dry. Do not get it wet. Follow your health care provider's instructions on how to take a shower or bath while the port is accessed.  If your port does not need to stay accessed, no bandage is needed over the port.  What is flushing? Flushing helps keep the port from getting clogged. Follow your health care provider's instructions on how and when to flush the port. Ports are usually flushed with saline solution or a medicine called heparin. The need for flushing will depend on how the port is used.  If the port is used for intermittent medicines or blood draws, the port will need to be flushed: ? After medicines have been given. ? After blood has been drawn. ? As part of routine maintenance.  If a constant infusion is running, the port may not need to be flushed.  How long will my port stay implanted? The port can stay in for as long as your health care provider thinks it is needed. When it is time for the port to come out, surgery will be   done to remove it. The procedure is similar to the one performed when the port was put in. When should I seek immediate medical care? When you have an implanted port, you should seek immediate medical care if:  You notice a bad smell coming from the incision site.  You have swelling, redness, or drainage at the incision site.  You have more swelling or pain at the port site or the surrounding area.  You have a fever that is not controlled with medicine.  This information is not intended to replace advice given to you by your health care provider. Make sure you discuss any questions you have with your health care provider. Document  Released: 05/20/2005 Document Revised: 10/26/2015 Document Reviewed: 01/25/2013 Elsevier Interactive Patient Education  2017 [employer]  

## 2017-04-06 ENCOUNTER — Other Ambulatory Visit: Payer: Self-pay | Admitting: Oncology

## 2017-04-07 ENCOUNTER — Ambulatory Visit (HOSPITAL_BASED_OUTPATIENT_CLINIC_OR_DEPARTMENT_OTHER): Payer: Medicare Other

## 2017-04-07 ENCOUNTER — Encounter: Payer: Self-pay | Admitting: Nurse Practitioner

## 2017-04-07 ENCOUNTER — Ambulatory Visit (HOSPITAL_BASED_OUTPATIENT_CLINIC_OR_DEPARTMENT_OTHER): Payer: Medicare Other | Admitting: Nurse Practitioner

## 2017-04-07 ENCOUNTER — Telehealth: Payer: Self-pay | Admitting: Oncology

## 2017-04-07 ENCOUNTER — Other Ambulatory Visit (HOSPITAL_BASED_OUTPATIENT_CLINIC_OR_DEPARTMENT_OTHER): Payer: Medicare Other

## 2017-04-07 VITALS — BP 105/71 | HR 73 | Temp 97.9°F | Resp 18 | Ht 72.0 in | Wt 192.2 lb

## 2017-04-07 DIAGNOSIS — Z5112 Encounter for antineoplastic immunotherapy: Secondary | ICD-10-CM | POA: Diagnosis present

## 2017-04-07 DIAGNOSIS — C20 Malignant neoplasm of rectum: Secondary | ICD-10-CM | POA: Diagnosis not present

## 2017-04-07 DIAGNOSIS — Z5111 Encounter for antineoplastic chemotherapy: Secondary | ICD-10-CM | POA: Diagnosis not present

## 2017-04-07 DIAGNOSIS — L271 Localized skin eruption due to drugs and medicaments taken internally: Secondary | ICD-10-CM | POA: Diagnosis not present

## 2017-04-07 DIAGNOSIS — C787 Secondary malignant neoplasm of liver and intrahepatic bile duct: Secondary | ICD-10-CM | POA: Diagnosis not present

## 2017-04-07 DIAGNOSIS — G620 Drug-induced polyneuropathy: Secondary | ICD-10-CM

## 2017-04-07 DIAGNOSIS — Z95828 Presence of other vascular implants and grafts: Secondary | ICD-10-CM

## 2017-04-07 LAB — CBC WITH DIFFERENTIAL/PLATELET
BASO%: 0.8 % (ref 0.0–2.0)
Basophils Absolute: 0 10*3/uL (ref 0.0–0.1)
EOS%: 2.8 % (ref 0.0–7.0)
Eosinophils Absolute: 0.1 10*3/uL (ref 0.0–0.5)
HCT: 44.2 % (ref 38.4–49.9)
HGB: 14.6 g/dL (ref 13.0–17.1)
LYMPH%: 33.5 % (ref 14.0–49.0)
MCH: 33.6 pg — ABNORMAL HIGH (ref 27.2–33.4)
MCHC: 33 g/dL (ref 32.0–36.0)
MCV: 101.7 fL — ABNORMAL HIGH (ref 79.3–98.0)
MONO#: 0.4 10*3/uL (ref 0.1–0.9)
MONO%: 8.3 % (ref 0.0–14.0)
NEUT%: 54.6 % (ref 39.0–75.0)
NEUTROS ABS: 2.7 10*3/uL (ref 1.5–6.5)
PLATELETS: 171 10*3/uL (ref 140–400)
RBC: 4.35 10*6/uL (ref 4.20–5.82)
RDW: 16.1 % — ABNORMAL HIGH (ref 11.0–14.6)
WBC: 5 10*3/uL (ref 4.0–10.3)
lymph#: 1.7 10*3/uL (ref 0.9–3.3)

## 2017-04-07 LAB — COMPREHENSIVE METABOLIC PANEL
ALBUMIN: 3.4 g/dL — AB (ref 3.5–5.0)
ALK PHOS: 98 U/L (ref 40–150)
ALT: 15 U/L (ref 0–55)
AST: 17 U/L (ref 5–34)
Anion Gap: 7 mEq/L (ref 3–11)
BILIRUBIN TOTAL: 0.41 mg/dL (ref 0.20–1.20)
BUN: 14.2 mg/dL (ref 7.0–26.0)
CALCIUM: 8.9 mg/dL (ref 8.4–10.4)
CO2: 28 mEq/L (ref 22–29)
CREATININE: 1.2 mg/dL (ref 0.7–1.3)
Chloride: 107 mEq/L (ref 98–109)
EGFR: >60 mL/min/{1.73_m2} (ref >60)
GLUCOSE: 90 mg/dL (ref 70–140)
POTASSIUM: 4.5 meq/L (ref 3.5–5.1)
SODIUM: 142 meq/L (ref 136–145)
TOTAL PROTEIN: 7 g/dL (ref 6.4–8.3)

## 2017-04-07 LAB — CEA (IN HOUSE-CHCC): CEA (CHCC-IN HOUSE): 1.29 ng/mL (ref 0.00–5.00)

## 2017-04-07 LAB — MAGNESIUM: Magnesium: 1.7 mg/dl (ref 1.5–2.5)

## 2017-04-07 MED ORDER — ATROPINE SULFATE 1 MG/ML IJ SOLN
0.5000 mg | Freq: Once | INTRAMUSCULAR | Status: AC | PRN
  Administered 2017-04-07: 0.5 mg via INTRAVENOUS

## 2017-04-07 MED ORDER — SODIUM CHLORIDE 0.9 % IV SOLN
Freq: Once | INTRAVENOUS | Status: AC
  Administered 2017-04-07: 12:00:00 via INTRAVENOUS

## 2017-04-07 MED ORDER — PALONOSETRON HCL INJECTION 0.25 MG/5ML
0.2500 mg | Freq: Once | INTRAVENOUS | Status: AC
  Administered 2017-04-07: 0.25 mg via INTRAVENOUS

## 2017-04-07 MED ORDER — DEXTROSE 5 % IV SOLN
125.0000 mg/m2 | Freq: Once | INTRAVENOUS | Status: AC
  Administered 2017-04-07: 260 mg via INTRAVENOUS
  Filled 2017-04-07: qty 13

## 2017-04-07 MED ORDER — SODIUM CHLORIDE 0.9 % IJ SOLN
10.0000 mL | INTRAMUSCULAR | Status: DC | PRN
  Administered 2017-04-07: 10 mL via INTRAVENOUS
  Filled 2017-04-07: qty 10

## 2017-04-07 MED ORDER — PALONOSETRON HCL INJECTION 0.25 MG/5ML
INTRAVENOUS | Status: AC
  Filled 2017-04-07: qty 5

## 2017-04-07 MED ORDER — ATROPINE SULFATE 1 MG/ML IJ SOLN
INTRAMUSCULAR | Status: AC
  Filled 2017-04-07: qty 1

## 2017-04-07 MED ORDER — LEUCOVORIN CALCIUM INJECTION 350 MG
300.0000 mg/m2 | Freq: Once | INTRAVENOUS | Status: AC
  Administered 2017-04-07: 616 mg via INTRAVENOUS
  Filled 2017-04-07: qty 30.8

## 2017-04-07 MED ORDER — ALTEPLASE 2 MG IJ SOLR
2.0000 mg | Freq: Once | INTRAMUSCULAR | Status: DC | PRN
  Filled 2017-04-07: qty 2

## 2017-04-07 MED ORDER — SODIUM CHLORIDE 0.9 % IV SOLN
2450.0000 mg/m2 | INTRAVENOUS | Status: DC
  Administered 2017-04-07: 5000 mg via INTRAVENOUS
  Filled 2017-04-07: qty 100

## 2017-04-07 MED ORDER — FLUOROURACIL CHEMO INJECTION 2.5 GM/50ML
300.0000 mg/m2 | Freq: Once | INTRAVENOUS | Status: AC
  Administered 2017-04-07: 600 mg via INTRAVENOUS
  Filled 2017-04-07: qty 12

## 2017-04-07 MED ORDER — SODIUM CHLORIDE 0.9 % IV SOLN
6.0000 mg/kg | Freq: Once | INTRAVENOUS | Status: AC
  Administered 2017-04-07: 500 mg via INTRAVENOUS
  Filled 2017-04-07: qty 5

## 2017-04-07 MED ORDER — SODIUM CHLORIDE 0.9 % IV SOLN
Freq: Once | INTRAVENOUS | Status: AC
  Administered 2017-04-07: 12:00:00 via INTRAVENOUS
  Filled 2017-04-07: qty 5

## 2017-04-07 NOTE — Progress Notes (Signed)
Flushed and repositioned needle with no blood return. Labs drawn peripherally. Dolphus Jenny desk nurse called [facility]) and will have an RN to [employer]. Porsche Cates LPN

## 2017-04-07 NOTE — Patient Instructions (Signed)
[facility] Discharge Instructions for Patients Receiving Chemotherapy  Today you received the following chemotherapy agents Vectibix, Irinotecan, Leucovorin, 5FU.   To help prevent nausea and vomiting after your treatment, we encourage you to take your nausea medication as prescribed.   If you develop nausea and vomiting that is not controlled by your nausea medication, call the clinic.   BELOW ARE SYMPTOMS THAT SHOULD BE REPORTED IMMEDIATELY:  *FEVER GREATER THAN 100.5 F  *CHILLS WITH OR WITHOUT FEVER  NAUSEA AND VOMITING THAT IS NOT CONTROLLED WITH YOUR NAUSEA MEDICATION  *UNUSUAL SHORTNESS OF BREATH  *UNUSUAL BRUISING OR BLEEDING  TENDERNESS IN MOUTH AND THROAT WITH OR WITHOUT PRESENCE OF ULCERS  *URINARY PROBLEMS  *BOWEL PROBLEMS  UNUSUAL RASH Items with * indicate a potential emergency and should be followed up as soon as possible.  Feel free to call the clinic should you have any questions or concerns. The clinic phone number is [phone].  Please show the [place] at check-in to the Emergency Department and triage nurse.

## 2017-04-07 NOTE — Progress Notes (Signed)
[place] OFFICE PROGRESS NOTE   Diagnosis: Rectal cancer  INTERVAL HISTORY:   [name] returns as scheduled.  He completed a cycle of FOLFIRI/Panitumumab 03/24/2017.  He had nausea beginning the day upon discontinuation and lasting for about 10 days.  One episode of vomiting.  No mouth sores.  No diarrhea.  Skin rash varies.  Skin remains very dry.  He has intermittent pain in the feet.  Objective:  Vital signs in last 24 hours:  Blood pressure 105/71, pulse 73, temperature 97.9 F (36.6 C), temperature source Oral, resp. rate 18, height 6' (1.829 m), weight 192 lb 3.2 oz (87.2 kg), SpO2 99 %.    HEENT: No thrush or ulcers. Resp: Lungs clear bilaterally. Cardio: Regular rate and rhythm. GI: Abdomen soft and nontender.  No hepatomegaly. Vascular: No leg edema. Skin: Skin in general has a very dry appearance.  Soles with dry desquamation. Port-A-Cath without erythema.   Lab Results:  Lab Results  Component Value Date   WBC 5.0 04/07/2017   HGB 14.6 04/07/2017   HCT 44.2 04/07/2017   MCV 101.7 (H) 04/07/2017   PLT 171 04/07/2017   NEUTROABS 2.7 04/07/2017    Imaging:  No results found.  Medications: I have reviewed the patient's current medications.  Assessment/Plan: 1. Rectal cancer, invasive adenocarcinoma confirmed at colonoscopy 05/22/2015, EUS staging 06/01/2015 revealed a uT3,N1 lesion at 7 cm from the anal verge  Staging CT abdomen/pelvis 05/15/2015 confirmed a mass at the rectosigmoid colon, a suspicious perirectal lymph node, and a 6 mm right liver lesion felt to most likely be a cyst  CT chest 05/30/2015 with indeterminate pulmonary nodules and multiple liver lesions suspicious for metastases  Ultrasound 06/13/2014 with no liver lesions identified  MRI abdomen 06/21/2015 consistent with multiple liver metastases  CT biopsy of liver lesion 06/26/2015. Pathology showed metastatic adenocarcinoma consistent with colonic  primary.MSI-stable, low mutation burden, no RAS or BRAFmutation  Cycle 1 FOLFOX 07/10/2015  Cycle 2 FOLFOX plus Avastin 07/24/2015 with Neulasta support  Cycle 3 FOLFOX plus Avastin 08/07/2015  Cycle 4 FOLFOX plus Avastin 08/21/2015  cycle 5 FOLFOX plus Avastin 09/04/2015  Restaging CTs 09/15/2015 revealed a decrease in the size of liver lesions, decreased rectal primary, stable/decreased size of tiny lung nodules  Cycle 6 FOLFOX plus Avastin 09/18/2015  Cycle 7 FOLFOX plus Avastin 10/02/2015  Cycle 8 FOLFOX plus Avastin 10/16/2015  Cycle 9 FOLFOX plus Avastin 11/06/2015  Cycle 10 FOLFOX plus Avastin 11/20/2015  CTs 12/07/2015-mild decrease in wall thickening at the rectum, decrease in tiny hepatic metastases, stable lung nodules, no evidence of progressive metastatic disease, new left lower lobe infiltrate  Treatment changed to every three-week Avastin with every other week Xeloda beginning 12/11/2015  Restaging CTs 04/12/2016-interval increase in size of adjacent right upper lobe pulmonary nodules and right lower lobe pulmonary nodule; unchanged 5 mm lesion right hepatic lobe; no additional visualized hepatic metastasis. Grossly unchanged wall thickening of the rectum.  Continuation of Xeloda every other week and every three-week AvastinXeloda placed on hold 05/06/2016 due to hand-foot syndrome  Xeloda resumed 05/28/2016 1500 mg every morning and 1000 mg every afternoon 7 days on/7 days off  CT 09/27/2016-enlargement of lung nodules and new liver lesions, stable rectal mass  Cycle 1 FOLFIRI/panitumumab 10/08/2016  Cycle 2 panitumumab 10/23/2016 (FOLFIRI held secondary to neutropenia)  Cycle 3 FOLFIRI/PANITUMUMAB 11/04/2016  Cycle 4 FOLFIRI/panitumumab 11/18/2016  Cycle 5 FOLFIRI/panitumumab 12/16/2016  Cycle 6 FOLFIRI/panitumumab 12/31/2016  CTs 01/16/2017-decreased size of pulmonary nodules and liver lesions.  Decreased perirectal lymph node  Cycle 7  FOLFIRI/PANITUMUMAB 01/28/2017  Cycle 8FOLFIRI/PANITUMUMAB 02/10/2017  Cycle 9 FOLFIRI/PANITUMUMAB 02/24/2017  Cycle 10 FOLFIRI/panitumumab 03/10/2017 (irinotecan held secondary to neutropenia)   Cycle 11 FOLFIRI/panitumumab 03/24/2017  Cycle 12 FOLFIRI/Panitumumab 04/07/2017 2.Multiple colon polyps on the colonoscopy 05/22/2015, tubular villous adenoma and a tubular adenoma were removed 3.Anemia-likely secondary to rectal bleeding.  4.Family history of multiple cancers including breast and prostate cancer 5.Transient right arm numbness 06/20/2015 6. Port-A-Cath placement 07/06/2015 by Dr. Marcello Moores 7. Mild neutropenia secondary to chemotherapy following cycle 1 FOLFOX. Neulasta added with cycle 2. 8. Delayed nausea following chemotherapy-emend added with cycle 3 FOLFOX; Emend added with cycle 12 FOLFIRI. 9. Early oxaliplatin neuropathy 10. 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 Xeloda resumed at a reduced dose. 11. Bilateral hand weakness. Question related to previous oxaliplatin. Referred to neurology 06/17/2016. 12. Hypertension-amlodipine started 07/08/2016 13. Rash secondary to PANITUMUMAB. He continues minocycline. 14. Neutropenia following cycle 1 FOLFIRI/PANITUMUMAB; neutropenia following cycle 4 FOLFIRI/PANITUMUMAB.   Disposition: Hunter Walker appears stable.  He has completed 11 cycles of FOLFIRI/Panitumumab.  Plan to proceed with cycle 12 today as scheduled.  He had significant delayed nausea following the last cycle.  We will add Emend to the premedication regimen.  He understands to contact the office if he has nausea despite this change.  He is scheduled for restaging CT scans 04/25/2017.  We will see him in follow-up on 04/28/2017.  He will contact the office in the interim as outlined above or with any other problems.  Plan reviewed with Dr. Benay Spice.    Ned Card ANP/GNP-BC   04/07/2017  10:40 AM

## 2017-04-07 NOTE — Telephone Encounter (Signed)
Left voicemail regarding appt changed per 11/5 sch msg.

## 2017-04-09 ENCOUNTER — Ambulatory Visit (HOSPITAL_BASED_OUTPATIENT_CLINIC_OR_DEPARTMENT_OTHER): Payer: Medicare Other

## 2017-04-09 VITALS — BP 123/88 | HR 76 | Temp 97.8°F | Resp 20

## 2017-04-09 DIAGNOSIS — Z452 Encounter for adjustment and management of vascular access device: Secondary | ICD-10-CM

## 2017-04-09 DIAGNOSIS — C20 Malignant neoplasm of rectum: Secondary | ICD-10-CM | POA: Diagnosis not present

## 2017-04-09 MED ORDER — SODIUM CHLORIDE 0.9% FLUSH
10.0000 mL | INTRAVENOUS | Status: DC | PRN
  Administered 2017-04-09: 10 mL
  Filled 2017-04-09: qty 10

## 2017-04-09 MED ORDER — HEPARIN SOD (PORK) LOCK FLUSH 100 UNIT/ML IV SOLN
500.0000 [IU] | Freq: Once | INTRAVENOUS | Status: AC | PRN
  Administered 2017-04-09: 500 [IU]
  Filled 2017-04-09: qty 5

## 2017-04-09 NOTE — Patient Instructions (Signed)
Implanted Port Home Guide An implanted port is a type of central line that is placed under the skin. Central lines are used to provide IV access when treatment or nutrition needs to be given through a person's veins. Implanted ports are used for long-term IV access. An implanted port may be placed because:  You need IV medicine that would be irritating to the small veins in your hands or arms.  You need long-term IV medicines, such as antibiotics.  You need IV nutrition for a long period.  You need frequent blood draws for lab tests.  You need dialysis.  Implanted ports are usually placed in the chest area, but they can also be placed in the upper arm, the abdomen, or the leg. An implanted port has two main parts:  Reservoir. The reservoir is round and will appear as a small, raised area under your skin. The reservoir is the part where a needle is inserted to give medicines or draw blood.  Catheter. The catheter is a thin, flexible tube that extends from the reservoir. The catheter is placed into a large vein. Medicine that is inserted into the reservoir goes into the catheter and then into the vein.  How will I care for my incision site? Do not get the incision site wet. Bathe or shower as directed by your health care provider. How is my port accessed? Special steps must be taken to access the port:  Before the port is accessed, a numbing cream can be placed on the skin. This helps numb the skin over the port site.  Your health care provider uses a sterile technique to access the port. ? Your health care provider must put on a mask and sterile gloves. ? The skin over your port is cleaned carefully with an antiseptic and allowed to dry. ? The port is gently pinched between sterile gloves, and a needle is inserted into the port.  Only "non-coring" port needles should be used to access the port. Once the port is accessed, a blood return should be checked. This helps ensure that the port  is in the vein and is not clogged.  If your port needs to remain accessed for a constant infusion, a clear (transparent) bandage will be placed over the needle site. The bandage and needle will need to be changed every week, or as directed by your health care provider.  Keep the bandage covering the needle clean and dry. Do not get it wet. Follow your health care provider's instructions on how to take a shower or bath while the port is accessed.  If your port does not need to stay accessed, no bandage is needed over the port.  What is flushing? Flushing helps keep the port from getting clogged. Follow your health care provider's instructions on how and when to flush the port. Ports are usually flushed with saline solution or a medicine called heparin. The need for flushing will depend on how the port is used.  If the port is used for intermittent medicines or blood draws, the port will need to be flushed: ? After medicines have been given. ? After blood has been drawn. ? As part of routine maintenance.  If a constant infusion is running, the port may not need to be flushed.  How long will my port stay implanted? The port can stay in for as long as your health care provider thinks it is needed. When it is time for the port to come out, surgery will be   done to remove it. The procedure is similar to the one performed when the port was put in. When should I seek immediate medical care? When you have an implanted port, you should seek immediate medical care if:  You notice a bad smell coming from the incision site.  You have swelling, redness, or drainage at the incision site.  You have more swelling or pain at the port site or the surrounding area.  You have a fever that is not controlled with medicine.  This information is not intended to replace advice given to you by your health care provider. Make sure you discuss any questions you have with your health care provider. Document  Released: 05/20/2005 Document Revised: 10/26/2015 Document Reviewed: 01/25/2013 Elsevier Interactive Patient Education  2017 [employer]  

## 2017-04-25 ENCOUNTER — Ambulatory Visit (HOSPITAL_COMMUNITY)
Admission: RE | Admit: 2017-04-25 | Discharge: 2017-04-25 | Disposition: A | Discharge status: Home / Self Care | Payer: Medicare Other | Source: Ambulatory Visit | Attending: Oncology | Admitting: Oncology

## 2017-04-25 ENCOUNTER — Other Ambulatory Visit: Payer: Medicare Other

## 2017-04-25 DIAGNOSIS — R918 Other nonspecific abnormal finding of lung field: Secondary | ICD-10-CM | POA: Insufficient documentation

## 2017-04-25 DIAGNOSIS — C20 Malignant neoplasm of rectum: Secondary | ICD-10-CM | POA: Diagnosis not present

## 2017-04-25 DIAGNOSIS — C218 Malignant neoplasm of overlapping sites of rectum, anus and anal canal: Secondary | ICD-10-CM | POA: Diagnosis not present

## 2017-04-25 DIAGNOSIS — R911 Solitary pulmonary nodule: Secondary | ICD-10-CM | POA: Diagnosis not present

## 2017-04-25 MED ORDER — IOPAMIDOL (ISOVUE-300) INJECTION 61%
INTRAVENOUS | Status: AC
  Filled 2017-04-25: qty 100

## 2017-04-25 MED ORDER — IOPAMIDOL (ISOVUE-300) INJECTION 61%
100.0000 mL | Freq: Once | INTRAVENOUS | Status: AC | PRN
Start: 2017-04-25 — End: 2017-04-25
  Administered 2017-04-25: 100 mL via INTRAVENOUS

## 2017-04-28 ENCOUNTER — Ambulatory Visit (HOSPITAL_BASED_OUTPATIENT_CLINIC_OR_DEPARTMENT_OTHER): Payer: Medicare Other | Admitting: Nurse Practitioner

## 2017-04-28 ENCOUNTER — Ambulatory Visit (HOSPITAL_BASED_OUTPATIENT_CLINIC_OR_DEPARTMENT_OTHER): Payer: Medicare Other

## 2017-04-28 ENCOUNTER — Encounter: Payer: Self-pay | Admitting: Nurse Practitioner

## 2017-04-28 ENCOUNTER — Ambulatory Visit: Payer: Medicare Other

## 2017-04-28 ENCOUNTER — Other Ambulatory Visit (HOSPITAL_BASED_OUTPATIENT_CLINIC_OR_DEPARTMENT_OTHER): Payer: Medicare Other

## 2017-04-28 ENCOUNTER — Telehealth: Payer: Self-pay | Admitting: Nurse Practitioner

## 2017-04-28 VITALS — BP 135/81 | HR 75 | Temp 97.8°F | Resp 18 | Ht 72.0 in | Wt 190.6 lb

## 2017-04-28 DIAGNOSIS — C20 Malignant neoplasm of rectum: Secondary | ICD-10-CM

## 2017-04-28 DIAGNOSIS — C787 Secondary malignant neoplasm of liver and intrahepatic bile duct: Secondary | ICD-10-CM | POA: Diagnosis not present

## 2017-04-28 DIAGNOSIS — L271 Localized skin eruption due to drugs and medicaments taken internally: Secondary | ICD-10-CM

## 2017-04-28 DIAGNOSIS — Z5112 Encounter for antineoplastic immunotherapy: Secondary | ICD-10-CM

## 2017-04-28 DIAGNOSIS — G620 Drug-induced polyneuropathy: Secondary | ICD-10-CM

## 2017-04-28 DIAGNOSIS — Z95828 Presence of other vascular implants and grafts: Secondary | ICD-10-CM

## 2017-04-28 LAB — COMPREHENSIVE METABOLIC PANEL
ALK PHOS: 109 U/L (ref 40–150)
ALT: 23 U/L (ref 0–55)
AST: 24 U/L (ref 5–34)
Albumin: 3.4 g/dL — ABNORMAL LOW (ref 3.5–5.0)
Anion Gap: 9 mEq/L (ref 3–11)
BUN: 11.5 mg/dL (ref 7.0–26.0)
CALCIUM: 8.8 mg/dL (ref 8.4–10.4)
CO2: 23 mEq/L (ref 22–29)
CREATININE: 1.2 mg/dL (ref 0.7–1.3)
Chloride: 109 mEq/L (ref 98–109)
EGFR: >60 mL/min/{1.73_m2} (ref >60)
Glucose: 96 mg/dl (ref 70–140)
Potassium: 4 mEq/L (ref 3.5–5.1)
Sodium: 140 mEq/L (ref 136–145)
TOTAL PROTEIN: 7.1 g/dL (ref 6.4–8.3)
Total Bilirubin: 0.29 mg/dL (ref 0.20–1.20)

## 2017-04-28 LAB — CBC WITH DIFFERENTIAL/PLATELET
BASO%: 0.4 % (ref 0.0–2.0)
Basophils Absolute: 0 10*3/uL (ref 0.0–0.1)
EOS%: 2.4 % (ref 0.0–7.0)
Eosinophils Absolute: 0.1 10*3/uL (ref 0.0–0.5)
HEMATOCRIT: 43.2 % (ref 38.4–49.9)
HEMOGLOBIN: 14.2 g/dL (ref 13.0–17.1)
LYMPH#: 2.3 10*3/uL (ref 0.9–3.3)
LYMPH%: 50.1 % — ABNORMAL HIGH (ref 14.0–49.0)
MCH: 33.1 pg (ref 27.2–33.4)
MCHC: 32.9 g/dL (ref 32.0–36.0)
MCV: 100.7 fL — ABNORMAL HIGH (ref 79.3–98.0)
MONO#: 0.5 10*3/uL (ref 0.1–0.9)
MONO%: 11.4 % (ref 0.0–14.0)
NEUT#: 1.6 10*3/uL (ref 1.5–6.5)
NEUT%: 35.7 % — ABNORMAL LOW (ref 39.0–75.0)
Platelets: 195 10*3/uL (ref 140–400)
RBC: 4.29 10*6/uL (ref 4.20–5.82)
RDW: 15.3 % — AB (ref 11.0–14.6)
WBC: 4.6 10*3/uL (ref 4.0–10.3)

## 2017-04-28 LAB — MAGNESIUM: MAGNESIUM: 1.6 mg/dL (ref 1.5–2.5)

## 2017-04-28 MED ORDER — SODIUM CHLORIDE 0.9 % IJ SOLN
10.0000 mL | INTRAMUSCULAR | Status: DC | PRN
  Administered 2017-04-28: 10 mL via INTRAVENOUS
  Filled 2017-04-28: qty 10

## 2017-04-28 MED ORDER — HEPARIN SOD (PORK) LOCK FLUSH 100 UNIT/ML IV SOLN
500.0000 [IU] | Freq: Once | INTRAVENOUS | Status: AC | PRN
  Administered 2017-04-28: 500 [IU]
  Filled 2017-04-28: qty 5

## 2017-04-28 MED ORDER — SODIUM CHLORIDE 0.9 % IV SOLN
6.0000 mg/kg | Freq: Once | INTRAVENOUS | Status: AC
  Administered 2017-04-28: 500 mg via INTRAVENOUS
  Filled 2017-04-28: qty 20

## 2017-04-28 MED ORDER — SODIUM CHLORIDE 0.9 % IV SOLN
Freq: Once | INTRAVENOUS | Status: AC
  Administered 2017-04-28: 12:00:00 via INTRAVENOUS

## 2017-04-28 MED ORDER — SODIUM CHLORIDE 0.9% FLUSH
10.0000 mL | INTRAVENOUS | Status: DC | PRN
  Administered 2017-04-28: 10 mL
  Filled 2017-04-28: qty 10

## 2017-04-28 NOTE — Progress Notes (Addendum)
[place] OFFICE PROGRESS NOTE   Diagnosis: Rectal cancer  INTERVAL HISTORY:   Mr. Meas returns as scheduled.  He completed cycle 12 FOLFIRI/Panitumumab 04/07/2017.  He denies significant nausea/vomiting.  No mouth sores.  No diarrhea.  Rash is better.  He continues minocycline.  Objective:  Vital signs in last 24 hours:  Blood pressure 135/81, pulse 75, temperature 97.8 F (36.6 C), temperature source Oral, resp. rate 18, height 6' (1.829 m), weight 190 lb 9.6 oz (86.5 kg), SpO2 100 %.    HEENT: No thrush or ulcers. Resp: Lungs clear bilaterally. Cardio: Regular rate and rhythm. GI: Abdomen soft and nontender.  No hepatomegaly. Vascular: No leg edema.  Skin: Acne-like rash scattered over the face.  Palms dry appearing.  No skin breakdown. Port-A-Cath without erythema.   Lab Results:  Lab Results  Component Value Date   WBC 4.6 04/28/2017   HGB 14.2 04/28/2017   HCT 43.2 04/28/2017   MCV 100.7 (H) 04/28/2017   PLT 195 04/28/2017   NEUTROABS 1.6 04/28/2017    Imaging:  No results found.  Medications: I have reviewed the patient's current medications.  Assessment/Plan: 1. Rectal cancer, invasive adenocarcinoma confirmed at colonoscopy 05/22/2015, EUS staging 06/01/2015 revealed a uT3,N1 lesion at 7 cm from the anal verge  Staging CT abdomen/pelvis 05/15/2015 confirmed a mass at the rectosigmoid colon, a suspicious perirectal lymph node, and a 6 mm right liver lesion felt to most likely be a cyst  CT chest 05/30/2015 with indeterminate pulmonary nodules and multiple liver lesions suspicious for metastases  Ultrasound 06/13/2014 with no liver lesions identified  MRI abdomen 06/21/2015 consistent with multiple liver metastases  CT biopsy of liver lesion 06/26/2015. Pathology showed metastatic adenocarcinoma consistent with colonic primary.MSI-stable, low mutation burden, no RAS or BRAFmutation  Cycle 1 FOLFOX 07/10/2015  Cycle 2 FOLFOX  plus Avastin 07/24/2015 with Neulasta support  Cycle 3 FOLFOX plus Avastin 08/07/2015  Cycle 4 FOLFOX plus Avastin 08/21/2015  cycle 5 FOLFOX plus Avastin 09/04/2015  Restaging CTs 09/15/2015 revealed a decrease in the size of liver lesions, decreased rectal primary, stable/decreased size of tiny lung nodules  Cycle 6 FOLFOX plus Avastin 09/18/2015  Cycle 7 FOLFOX plus Avastin 10/02/2015  Cycle 8 FOLFOX plus Avastin 10/16/2015  Cycle 9 FOLFOX plus Avastin 11/06/2015  Cycle 10 FOLFOX plus Avastin 11/20/2015  CTs 12/07/2015-mild decrease in wall thickening at the rectum, decrease in tiny hepatic metastases, stable lung nodules, no evidence of progressive metastatic disease, new left lower lobe infiltrate  Treatment changed to every three-week Avastin with every other week Xeloda beginning 12/11/2015  Restaging CTs 04/12/2016-interval increase in size of adjacent right upper lobe pulmonary nodules and right lower lobe pulmonary nodule; unchanged 5 mm lesion right hepatic lobe; no additional visualized hepatic metastasis. Grossly unchanged wall thickening of the rectum.  Continuation of Xeloda every other week and every three-week AvastinXeloda placed on hold 05/06/2016 due to hand-foot syndrome  Xeloda resumed 05/28/2016 1500 mg every morning and 1000 mg every afternoon 7 days on/7 days off  CT 09/27/2016-enlargement of lung nodules and new liver lesions, stable rectal mass  Cycle 1 FOLFIRI/panitumumab 10/08/2016  Cycle 2 panitumumab 10/23/2016 (FOLFIRI held secondary to neutropenia)  Cycle 3 FOLFIRI/PANITUMUMAB 11/04/2016  Cycle 4 FOLFIRI/panitumumab 11/18/2016  Cycle 5 FOLFIRI/panitumumab 12/16/2016  Cycle 6 FOLFIRI/panitumumab 12/31/2016  CTs 01/16/2017-decreased size of pulmonary nodules and liver lesions. Decreased perirectal lymph node  Cycle 7 FOLFIRI/PANITUMUMAB 01/28/2017  Cycle 8FOLFIRI/PANITUMUMAB 02/10/2017  Cycle 9 FOLFIRI/PANITUMUMAB 02/24/2017  Cycle  10 FOLFIRI/panitumumab  03/10/2017 (irinotecan held secondary to neutropenia)  Cycle 11 FOLFIRI/panitumumab 03/24/2017  Cycle 12 FOLFIRI/Panitumumab 04/07/2017  Restaging CTs 04/25/2017-stable rectal mass with adjacent 8 mm left perirectal nodal metastasis.  Prior hepatic metastases not visualized on the current study.  Indeterminate 16 mm enhancing lesion present in segment 6, indeterminate.  Minimal residual nodularity in the right upper lobe.  No new/progressive pulmonary metastases. 2.Multiple colon polyps on the colonoscopy 05/22/2015, tubular villous adenoma and a tubular adenoma were removed 3.Anemia-likely secondary to rectal bleeding.  4.Family history of multiple cancers including breast and prostate cancer 5.Transient right arm numbness 06/20/2015 6. Port-A-Cath placement 07/06/2015 by Dr. Marcello Moores 7. Mild neutropenia secondary to chemotherapy following cycle 1 FOLFOX. Neulasta added with cycle 2. 8. Delayed nausea following chemotherapy-emend added with cycle 3 FOLFOX; Emend added with cycle 12 FOLFIRI. 9. Early oxaliplatin neuropathy 10. 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 Xeloda resumed at a reduced dose. 11. Bilateral hand weakness. Question related to previous oxaliplatin. Referred to neurology 06/17/2016. 12. Hypertension-amlodipine started 07/08/2016 13. Rash secondary to PANITUMUMAB. He continues minocycline. 14. Neutropenia following cycle 1 FOLFIRI/PANITUMUMAB; neutropenia following cycle 4 FOLFIRI/PANITUMUMAB.   Disposition: Hunter Walker appears stable.  He has completed 12 cycles of FOLFIRI/Panitumumab.  The recent restaging CT evaluation shows improvement.  Dr. Benay Spice reviewed those results with him at today's visit.  The plan is to begin maintenance therapy with Xeloda 7 days on/7 days off with continuation of Panitumumab on a 3-week schedule.  He has taken Xeloda in the past and is familiar with the potential toxicities.  He agrees  to proceed as outlined above.  He will receive Panitumumab today.  He will return for a follow-up visit in 3 weeks.  He will contact the office in the interim with any problems.  Patient seen with Dr. Benay Spice.  Ned Card ANP/GNP-BC   04/28/2017  10:33 AM This was a shared visit with Ned Card.  The restaging CTs reveal improved disease.  The plan is to switch to a maintenance Xeloda/panitumumab regimen.  We discussed the CT results with Mr. Dimmick.  He agrees to resume Xeloda.  The plan is to begin Xeloda on 05/04/2017.  Xeloda was prescribed today.  He will return for an office visit in 3 weeks.  Julieanne Manson, MD

## 2017-04-28 NOTE — Telephone Encounter (Signed)
Scheduled appt per 11/26 los - patient to get an updated schedule in the treatment area.

## 2017-04-28 NOTE — Patient Instructions (Signed)
[facility] Discharge Instructions for Patients Receiving Chemotherapy  Today you received the following chemotherapy agents: Vectibix  To help prevent nausea and vomiting after your treatment, we encourage you to take your nausea medication as prescribed.    If you develop nausea and vomiting that is not controlled by your nausea medication, call the clinic.   BELOW ARE SYMPTOMS THAT SHOULD BE REPORTED IMMEDIATELY:  *FEVER GREATER THAN 100.5 F  *CHILLS WITH OR WITHOUT FEVER  NAUSEA AND VOMITING THAT IS NOT CONTROLLED WITH YOUR NAUSEA MEDICATION  *UNUSUAL SHORTNESS OF BREATH  *UNUSUAL BRUISING OR BLEEDING  TENDERNESS IN MOUTH AND THROAT WITH OR WITHOUT PRESENCE OF ULCERS  *URINARY PROBLEMS  *BOWEL PROBLEMS  UNUSUAL RASH Items with * indicate a potential emergency and should be followed up as soon as possible.  Feel free to call the clinic should you have any questions or concerns. The clinic phone number is [phone].  Please show the CHEMO ALERT CARD at check-in to the Emergency Department and triage nurse.   

## 2017-04-29 ENCOUNTER — Telehealth: Payer: Self-pay | Admitting: Pharmacist

## 2017-04-29 DIAGNOSIS — C20 Malignant neoplasm of rectum: Secondary | ICD-10-CM

## 2017-04-29 MED ORDER — CAPECITABINE 500 MG PO TABS: ORAL_TABLET | ORAL | 0 refills | Status: DC

## 2017-04-29 MED FILL — XELODA 500 MG TABLET: 500 | 28 days supply | Qty: 98 | Fill #0

## 2017-04-29 NOTE — Telephone Encounter (Signed)
Oral Chemotherapy Pharmacist Encounter   I spoke with patient for overview of: Xeloda (capecitabine).   Pt is doing well. Counseled patient on administration, dosing, side effects, monitoring, drug-food interactions, safe handling, storage, and disposal.  Patient will take Xeloda 500mg  tablets, 4 tablets (2000mg ) by mouth in AM and 3 tabs (1500mg ) by mouth in PM, within 30 minutes of finishing meals, on days 1-7 and 15-21 of each 28 day cycle.  Patient understands his Xeloda dose is increased from when he last took it. Vectibix will be infused at 6mg /kg on day 1 of each 21 day cycle. Xeloda start date: 05/04/17  Side effects of Xeloda include but not limited to: fatigue, decreased blood counts, GI upset, diarrhea, and hand-foot syndrome. Patient has loperamide at home and will call the office if diarrhea develops.    Reviewed with patient importance of keeping a medication schedule and plan for any missed doses.  Hunter Walker voiced understanding and appreciation.   All questions answered. Medication reconciliation performed and medication/allergy list updated.  Prescription is being processed at the [facility] for $0 copayment (medicare + supplement) for a 28-day supply. Xeloda will ship today for delivery to patient's home tomorrow (04/30/17). Patient understands to wait until 05/04/17 to start.   Office appointments for 05/19/17 conformed with patient.  Patient knows to call the office with questions or concerns. Oral Oncology Clinic will continue to follow.  Thank you,  Hunter Walker, PharmD, BCPS, BCOP 04/29/2017   11:09 AM Oral Oncology Clinic [phone]

## 2017-04-29 NOTE — Telephone Encounter (Signed)
Oral Oncology Pharmacist Encounter  Received new prescription for Xeloda (capcitabine) for the maintenance treatment of metastatic rectal cancer in conjunction with Vectibix, planned duration until disease progression or unacceptable toxicity. Noted patient on previous maintenance treatment with Xeloda and Avastin. Patient did experience HFS and Xeloda dose was decreased with better toleration. Current Xeloda prescription written for 825mg /m2 per MD  Labs from 04/28/17 assessed, OK for treatment.  Current medication list in Epic reviewed, no significant DDIs with Xeloda identified.  Prescription has been e-scribed to the [facility] for benefits analysis and approval.  Oral Oncology Clinic will continue to follow for insurance authorization, copayment issues, initial counseling and start date.  Johny Drilling, PharmD, BCPS, BCOP 04/29/2017 11:00 AM Oral Oncology Clinic [phone]

## 2017-05-19 ENCOUNTER — Ambulatory Visit (HOSPITAL_BASED_OUTPATIENT_CLINIC_OR_DEPARTMENT_OTHER): Payer: Medicare Other

## 2017-05-19 ENCOUNTER — Encounter: Payer: Self-pay | Admitting: Oncology

## 2017-05-19 ENCOUNTER — Ambulatory Visit: Payer: Medicare Other

## 2017-05-19 ENCOUNTER — Other Ambulatory Visit (HOSPITAL_BASED_OUTPATIENT_CLINIC_OR_DEPARTMENT_OTHER): Payer: Medicare Other

## 2017-05-19 ENCOUNTER — Ambulatory Visit (HOSPITAL_BASED_OUTPATIENT_CLINIC_OR_DEPARTMENT_OTHER): Payer: Medicare Other | Admitting: Oncology

## 2017-05-19 VITALS — BP 124/83 | HR 72 | Temp 97.7°F | Resp 18 | Ht 72.0 in | Wt 188.0 lb

## 2017-05-19 DIAGNOSIS — G620 Drug-induced polyneuropathy: Secondary | ICD-10-CM

## 2017-05-19 DIAGNOSIS — Z5112 Encounter for antineoplastic immunotherapy: Secondary | ICD-10-CM | POA: Diagnosis not present

## 2017-05-19 DIAGNOSIS — L271 Localized skin eruption due to drugs and medicaments taken internally: Secondary | ICD-10-CM

## 2017-05-19 DIAGNOSIS — C20 Malignant neoplasm of rectum: Secondary | ICD-10-CM

## 2017-05-19 DIAGNOSIS — Z95828 Presence of other vascular implants and grafts: Secondary | ICD-10-CM

## 2017-05-19 DIAGNOSIS — C787 Secondary malignant neoplasm of liver and intrahepatic bile duct: Secondary | ICD-10-CM

## 2017-05-19 LAB — CBC WITH DIFFERENTIAL/PLATELET
BASO%: 0.6 % (ref 0.0–2.0)
Basophils Absolute: 0 10*3/uL (ref 0.0–0.1)
EOS ABS: 0.2 10*3/uL (ref 0.0–0.5)
EOS%: 3.4 % (ref 0.0–7.0)
HEMATOCRIT: 42.7 % (ref 38.4–49.9)
HGB: 14.2 g/dL (ref 13.0–17.1)
LYMPH#: 1.7 10*3/uL (ref 0.9–3.3)
LYMPH%: 31.2 % (ref 14.0–49.0)
MCH: 33.6 pg — ABNORMAL HIGH (ref 27.2–33.4)
MCHC: 33.3 g/dL (ref 32.0–36.0)
MCV: 100.9 fL — AB (ref 79.3–98.0)
MONO#: 0.5 10*3/uL (ref 0.1–0.9)
MONO%: 9.2 % (ref 0.0–14.0)
NEUT%: 55.6 % (ref 39.0–75.0)
NEUTROS ABS: 3 10*3/uL (ref 1.5–6.5)
PLATELETS: 174 10*3/uL (ref 140–400)
RBC: 4.23 10*6/uL (ref 4.20–5.82)
RDW: 15.8 % — ABNORMAL HIGH (ref 11.0–14.6)
WBC: 5.3 10*3/uL (ref 4.0–10.3)

## 2017-05-19 LAB — COMPREHENSIVE METABOLIC PANEL
ALT: 16 U/L (ref 0–55)
AST: 21 U/L (ref 5–34)
Albumin: 3.5 g/dL (ref 3.5–5.0)
Alkaline Phosphatase: 131 U/L (ref 40–150)
Anion Gap: 9 mEq/L (ref 3–11)
BUN: 14.4 mg/dL (ref 7.0–26.0)
CHLORIDE: 108 meq/L (ref 98–109)
CO2: 23 mEq/L (ref 22–29)
Calcium: 9 mg/dL (ref 8.4–10.4)
Creatinine: 1 mg/dL (ref 0.7–1.3)
EGFR: >60 mL/min/{1.73_m2} (ref >60)
GLUCOSE: 80 mg/dL (ref 70–140)
POTASSIUM: 4.1 meq/L (ref 3.5–5.1)
SODIUM: 140 meq/L (ref 136–145)
Total Bilirubin: 0.57 mg/dL (ref 0.20–1.20)
Total Protein: 7.1 g/dL (ref 6.4–8.3)

## 2017-05-19 LAB — CEA (IN HOUSE-CHCC): CEA (CHCC-In House): 1.27 ng/mL (ref 0.00–5.00)

## 2017-05-19 LAB — MAGNESIUM: Magnesium: 1.5 mg/dl (ref 1.5–2.5)

## 2017-05-19 MED ORDER — SODIUM CHLORIDE 0.9 % IV SOLN
Freq: Once | INTRAVENOUS | Status: AC
  Administered 2017-05-19: 12:00:00 via INTRAVENOUS

## 2017-05-19 MED ORDER — SODIUM CHLORIDE 0.9% FLUSH
10.0000 mL | INTRAVENOUS | Status: DC | PRN
  Administered 2017-05-19: 10 mL
  Filled 2017-05-19: qty 10

## 2017-05-19 MED ORDER — HEPARIN SOD (PORK) LOCK FLUSH 100 UNIT/ML IV SOLN
500.0000 [IU] | Freq: Once | INTRAVENOUS | Status: AC | PRN
  Administered 2017-05-19: 500 [IU]
  Filled 2017-05-19: qty 5

## 2017-05-19 MED ORDER — PANITUMUMAB CHEMO INJECTION 100 MG/5ML
6.0000 mg/kg | Freq: Once | INTRAVENOUS | Status: AC
  Administered 2017-05-19: 500 mg via INTRAVENOUS
  Filled 2017-05-19: qty 20

## 2017-05-19 MED ORDER — SODIUM CHLORIDE 0.9 % IJ SOLN
10.0000 mL | INTRAMUSCULAR | Status: DC | PRN
  Administered 2017-05-19: 10 mL via INTRAVENOUS
  Filled 2017-05-19: qty 10

## 2017-05-19 MED ORDER — CAPECITABINE 500 MG PO TABS: ORAL_TABLET | ORAL | 0 refills | Status: DC

## 2017-05-19 NOTE — Progress Notes (Signed)
[place] OFFICE PROGRESS NOTE   Diagnosis: Rectal cancer  INTERVAL HISTORY:   Hunter Walker returns as scheduled.  He feels well.  He began treatment with maintenance Xeloda/panitumumab after the last office visit here.  He started the most recent week of Xeloda on 05/18/2017.  No mouth sores or diarrhea.  Peripheral neuropathy symptoms have improved.  The skin rash has improved.  He continues minocycline.  Objective:  Vital signs in last 24 hours:  Blood pressure 124/83, pulse 72, temperature 97.7 F (36.5 C), temperature source Oral, resp. rate 18, height 6' (1.829 m), weight 188 lb (85.3 kg), SpO2 100 %.    HEENT: Hyperpigmentation, no thrush or ulcer Resp: Lungs clear bilaterally Cardio: Regular rate and rhythm GI: No hepatosplenomegaly, no mass, nontender Vascular: No leg edema  Skin: Palms without erythema or skin breakdown, diffuse dryness with flaking over the trunk.  Portacath/PICC-without erythema  Lab Results:  Lab Results  Component Value Date   WBC 5.3 05/19/2017   HGB 14.2 05/19/2017   HCT 42.7 05/19/2017   MCV 100.9 (H) 05/19/2017   PLT 174 05/19/2017   NEUTROABS 3.0 05/19/2017    CMP     Component Value Date/Time   NA 140 05/19/2017 0858   K 4.1 05/19/2017 0858   CL 109 07/30/2015 1844   CO2 23 05/19/2017 0858   GLUCOSE 80 05/19/2017 0858   BUN 14.4 05/19/2017 0858   CREATININE 1.0 05/19/2017 0858   CALCIUM 9.0 05/19/2017 0858   PROT 7.1 05/19/2017 0858   ALBUMIN 3.5 05/19/2017 0858   AST 21 05/19/2017 0858   ALT 16 05/19/2017 0858   ALKPHOS 131 05/19/2017 0858   BILITOT 0.57 05/19/2017 0858   GFRNONAA >60 07/30/2015 1844   GFRAA >60 07/30/2015 1844    Lab Results  Component Value Date   CEA1 1.29 04/07/2017   Medications: I have reviewed the patient's current medications.   Assessment/Plan: 1. Rectal cancer, invasive adenocarcinoma confirmed at colonoscopy 05/22/2015, EUS staging 06/01/2015 revealed a uT3,N1 lesion  at 7 cm from the anal verge  Staging CT abdomen/pelvis 05/15/2015 confirmed a mass at the rectosigmoid colon, a suspicious perirectal lymph node, and a 6 mm right liver lesion felt to most likely be a cyst  CT chest 05/30/2015 with indeterminate pulmonary nodules and multiple liver lesions suspicious for metastases  Ultrasound 06/13/2014 with no liver lesions identified  MRI abdomen 06/21/2015 consistent with multiple liver metastases  CT biopsy of liver lesion 06/26/2015. Pathology showed metastatic adenocarcinoma consistent with colonic primary.MSI-stable, low mutation burden, no RAS or BRAFmutation  Cycle 1 FOLFOX 07/10/2015  Cycle 2 FOLFOX plus Avastin 07/24/2015 with Neulasta support  Cycle 3 FOLFOX plus Avastin 08/07/2015  Cycle 4 FOLFOX plus Avastin 08/21/2015  cycle 5 FOLFOX plus Avastin 09/04/2015  Restaging CTs 09/15/2015 revealed a decrease in the size of liver lesions, decreased rectal primary, stable/decreased size of tiny lung nodules  Cycle 6 FOLFOX plus Avastin 09/18/2015  Cycle 7 FOLFOX plus Avastin 10/02/2015  Cycle 8 FOLFOX plus Avastin 10/16/2015  Cycle 9 FOLFOX plus Avastin 11/06/2015  Cycle 10 FOLFOX plus Avastin 11/20/2015  CTs 12/07/2015-mild decrease in wall thickening at the rectum, decrease in tiny hepatic metastases, stable lung nodules, no evidence of progressive metastatic disease, new left lower lobe infiltrate  Treatment changed to every three-week Avastin with every other week Xeloda beginning 12/11/2015  Restaging CTs 04/12/2016-interval increase in size of adjacent right upper lobe pulmonary nodules and right lower lobe pulmonary nodule; unchanged 5 mm lesion  right hepatic lobe; no additional visualized hepatic metastasis. Grossly unchanged wall thickening of the rectum.  Continuation of Xeloda every other week and every three-week AvastinXeloda placed on hold 05/06/2016 due to hand-foot syndrome  Xeloda resumed 05/28/2016 1500 mg every  morning and 1000 mg every afternoon 7 days on/7 days off  CT 09/27/2016-enlargement of lung nodules and new liver lesions, stable rectal mass  Cycle 1 FOLFIRI/panitumumab 10/08/2016  Cycle 2 panitumumab 10/23/2016 (FOLFIRI held secondary to neutropenia)  Cycle 3 FOLFIRI/PANITUMUMAB 11/04/2016  Cycle 4 FOLFIRI/panitumumab 11/18/2016  Cycle 5 FOLFIRI/panitumumab 12/16/2016  Cycle 6 FOLFIRI/panitumumab 12/31/2016  CTs 01/16/2017-decreased size of pulmonary nodules and liver lesions. Decreased perirectal lymph node  Cycle 7 FOLFIRI/PANITUMUMAB 01/28/2017  Cycle 8FOLFIRI/PANITUMUMAB 02/10/2017  Cycle 9 FOLFIRI/PANITUMUMAB 02/24/2017  Cycle 10 FOLFIRI/panitumumab 03/10/2017 (irinotecan held secondary to neutropenia)  Cycle 11 FOLFIRI/panitumumab 03/24/2017  Cycle12 FOLFIRI/Panitumumab 04/07/2017  Restaging CTs 04/25/2017-stable rectal mass with adjacent 8 mm left perirectal nodal metastasis.  Prior hepatic metastases not visualized on the current study.  Indeterminate 16 mm enhancing lesion present in segment 6, indeterminate.  Minimal residual nodularity in the right upper lobe.  No new/progressive pulmonary metastases.  Maintenance Xeloda/panitumumab 04/28/2017 2.Multiple colon polyps on the colonoscopy 05/22/2015, tubular villous adenoma and a tubular adenoma were removed 3.Anemia-likely secondary to rectal bleeding.  4.Family history of multiple cancers including breast and prostate cancer 5.Transient right arm numbness 06/20/2015 6. Port-A-Cath placement 07/06/2015 by Dr. Marcello Moores 7. Mild neutropenia secondary to chemotherapy following cycle 1 FOLFOX. Neulasta added with cycle 2. 8. Delayed nausea following chemotherapy-emend added with cycle 3 FOLFOX;Emend added with cycle 12 FOLFIRI. 9. Early oxaliplatin neuropathy 10. 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 Xeloda resumed at a reduced dose. 11. Bilateral hand weakness. Question related to  previous oxaliplatin. Referred to neurology 06/17/2016. 12. Hypertension-amlodipine started 07/08/2016 13. Rash secondary to PANITUMUMAB. He continues minocycline. 14. Neutropenia following cycle 1 FOLFIRI/PANITUMUMAB; neutropenia following cycle 4 FOLFIRI/PANITUMUMAB.   Disposition: Hunter Walker appears stable.  He will continue Xeloda/panitumumab.  He will return for an office visit in 3 weeks.  He will increase the use of skin moisturizers.  15 minutes were spent with the patient today.  The majority of the time was used for counseling and coordination of care.  Betsy Coder, MD  05/19/2017  10:19 AM

## 2017-05-19 NOTE — Patient Instructions (Signed)
[facility] Discharge Instructions for Patients Receiving Chemotherapy  Today you received the following chemotherapy agents: Vectibix.  To help prevent nausea and vomiting after your treatment, we encourage you to take your nausea medication as directed.   If you develop nausea and vomiting that is not controlled by your nausea medication, call the clinic.   BELOW ARE SYMPTOMS THAT SHOULD BE REPORTED IMMEDIATELY:  *FEVER GREATER THAN 100.5 F  *CHILLS WITH OR WITHOUT FEVER  NAUSEA AND VOMITING THAT IS NOT CONTROLLED WITH YOUR NAUSEA MEDICATION  *UNUSUAL SHORTNESS OF BREATH  *UNUSUAL BRUISING OR BLEEDING  TENDERNESS IN MOUTH AND THROAT WITH OR WITHOUT PRESENCE OF ULCERS  *URINARY PROBLEMS  *BOWEL PROBLEMS  UNUSUAL RASH Items with * indicate a potential emergency and should be followed up as soon as possible.  Feel free to call the clinic should you have any questions or concerns. The clinic phone number is [phone].  Please show the CHEMO ALERT CARD at check-in to the Emergency Department and triage nurse.   

## 2017-05-19 NOTE — Addendum Note (Signed)
Addended by: Brien Few on: 05/19/2017 07:11 PM   Modules accepted: Orders

## 2017-05-23 MED FILL — XELODA 500 MG TABLET: 500 | 28 days supply | Qty: 98 | Fill #0

## 2017-06-07 ENCOUNTER — Other Ambulatory Visit: Payer: Self-pay | Admitting: Oncology

## 2017-06-09 ENCOUNTER — Encounter: Payer: Self-pay | Admitting: Nurse Practitioner

## 2017-06-09 ENCOUNTER — Inpatient Hospital Stay: Payer: Medicare HMO

## 2017-06-09 ENCOUNTER — Other Ambulatory Visit: Payer: Self-pay | Admitting: Nurse Practitioner

## 2017-06-09 ENCOUNTER — Telehealth: Payer: Self-pay | Admitting: Nurse Practitioner

## 2017-06-09 ENCOUNTER — Inpatient Hospital Stay: Payer: Medicare HMO | Attending: Nurse Practitioner | Admitting: Nurse Practitioner

## 2017-06-09 ENCOUNTER — Other Ambulatory Visit: Payer: Self-pay | Admitting: Emergency Medicine

## 2017-06-09 VITALS — BP 145/90 | HR 73 | Temp 97.6°F | Resp 17 | Ht 72.0 in | Wt 190.2 lb

## 2017-06-09 DIAGNOSIS — T451X5S Adverse effect of antineoplastic and immunosuppressive drugs, sequela: Secondary | ICD-10-CM

## 2017-06-09 DIAGNOSIS — C20 Malignant neoplasm of rectum: Secondary | ICD-10-CM | POA: Insufficient documentation

## 2017-06-09 DIAGNOSIS — R21 Rash and other nonspecific skin eruption: Secondary | ICD-10-CM | POA: Insufficient documentation

## 2017-06-09 DIAGNOSIS — D649 Anemia, unspecified: Secondary | ICD-10-CM | POA: Diagnosis not present

## 2017-06-09 DIAGNOSIS — Z79899 Other long term (current) drug therapy: Secondary | ICD-10-CM | POA: Diagnosis not present

## 2017-06-09 DIAGNOSIS — Z5112 Encounter for antineoplastic immunotherapy: Secondary | ICD-10-CM | POA: Diagnosis not present

## 2017-06-09 DIAGNOSIS — Z95828 Presence of other vascular implants and grafts: Secondary | ICD-10-CM

## 2017-06-09 DIAGNOSIS — C787 Secondary malignant neoplasm of liver and intrahepatic bile duct: Secondary | ICD-10-CM | POA: Diagnosis not present

## 2017-06-09 DIAGNOSIS — Z8042 Family history of malignant neoplasm of prostate: Secondary | ICD-10-CM

## 2017-06-09 DIAGNOSIS — Z803 Family history of malignant neoplasm of breast: Secondary | ICD-10-CM | POA: Insufficient documentation

## 2017-06-09 LAB — CBC WITH DIFFERENTIAL/PLATELET
Abs Granulocyte: 3 10*3/uL (ref 1.5–6.5)
BASOS ABS: 0 10*3/uL (ref 0.0–0.1)
Basophils Relative: 0 %
EOS PCT: 3 %
Eosinophils Absolute: 0.2 10*3/uL (ref 0.0–0.5)
HCT: 42.2 % (ref 38.4–49.9)
Hemoglobin: 14 g/dL (ref 13.0–17.1)
LYMPHS ABS: 2.4 10*3/uL (ref 0.9–3.3)
LYMPHS PCT: 40 %
MCH: 34.6 pg — AB (ref 27.2–33.4)
MCHC: 33.2 g/dL (ref 32.0–36.0)
MCV: 104.2 fL — AB (ref 79.3–98.0)
MONO ABS: 0.4 10*3/uL (ref 0.1–0.9)
MONOS PCT: 7 %
NEUTROS PCT: 50 %
Neutro Abs: 3 10*3/uL (ref 1.5–6.5)
Platelets: 190 10*3/uL (ref 140–400)
RBC: 4.05 MIL/uL — ABNORMAL LOW (ref 4.20–5.82)
RDW: 16.5 % — ABNORMAL HIGH (ref 11.0–15.6)
WBC: 6.1 10*3/uL (ref 4.0–10.3)

## 2017-06-09 LAB — COMPREHENSIVE METABOLIC PANEL
ALK PHOS: 116 U/L (ref 40–150)
ALT: 30 U/L (ref 0–55)
ANION GAP: 7 (ref 3–11)
AST: 28 U/L (ref 5–34)
Albumin: 3.7 g/dL (ref 3.5–5.0)
BILIRUBIN TOTAL: 0.6 mg/dL (ref 0.2–1.2)
BUN: 11 mg/dL (ref 7–26)
CALCIUM: 8.9 mg/dL (ref 8.4–10.4)
CO2: 24 mmol/L (ref 22–29)
Chloride: 108 mmol/L (ref 98–109)
Creatinine, Ser: 1.03 mg/dL (ref 0.70–1.30)
GFR calc non Af Amer: >60 mL/min (ref >60)
Glucose, Bld: 109 mg/dL (ref 70–140)
POTASSIUM: 3.9 mmol/L (ref 3.5–5.1)
Sodium: 139 mmol/L (ref 136–145)
TOTAL PROTEIN: 7.2 g/dL (ref 6.4–8.3)

## 2017-06-09 LAB — MAGNESIUM: MAGNESIUM: 1.4 mg/dL — AB (ref 1.5–2.5)

## 2017-06-09 MED ORDER — PANITUMUMAB CHEMO INJECTION 100 MG/5ML
6.0000 mg/kg | Freq: Once | INTRAVENOUS | Status: AC
  Administered 2017-06-09: 500 mg via INTRAVENOUS
  Filled 2017-06-09: qty 20

## 2017-06-09 MED ORDER — SODIUM CHLORIDE 0.9% FLUSH
10.0000 mL | INTRAVENOUS | Status: DC | PRN
  Administered 2017-06-09: 10 mL
  Filled 2017-06-09: qty 10

## 2017-06-09 MED ORDER — AMLODIPINE BESYLATE 5 MG PO TABS: 5.0000 mg | ORAL_TABLET | Freq: Every day | ORAL | 2 refills | Status: DC

## 2017-06-09 MED ORDER — HEPARIN SOD (PORK) LOCK FLUSH 100 UNIT/ML IV SOLN
500.0000 [IU] | Freq: Once | INTRAVENOUS | Status: AC | PRN
  Administered 2017-06-09: 500 [IU]
  Filled 2017-06-09: qty 5

## 2017-06-09 MED ORDER — SODIUM CHLORIDE 0.9 % IJ SOLN
10.0000 mL | INTRAMUSCULAR | Status: DC | PRN
  Administered 2017-06-09: 10 mL via INTRAVENOUS
  Filled 2017-06-09: qty 10

## 2017-06-09 MED ORDER — SODIUM CHLORIDE 0.9 % IV SOLN
Freq: Once | INTRAVENOUS | Status: AC
  Administered 2017-06-09: 15:00:00 via INTRAVENOUS

## 2017-06-09 NOTE — Patient Instructions (Signed)
[facility] Discharge Instructions for Patients Receiving Chemotherapy  Today you received the following chemotherapy agents: Vectibix.  To help prevent nausea and vomiting after your treatment, we encourage you to take your nausea medication as directed.   If you develop nausea and vomiting that is not controlled by your nausea medication, call the clinic.   BELOW ARE SYMPTOMS THAT SHOULD BE REPORTED IMMEDIATELY:  *FEVER GREATER THAN 100.5 F  *CHILLS WITH OR WITHOUT FEVER  NAUSEA AND VOMITING THAT IS NOT CONTROLLED WITH YOUR NAUSEA MEDICATION  *UNUSUAL SHORTNESS OF BREATH  *UNUSUAL BRUISING OR BLEEDING  TENDERNESS IN MOUTH AND THROAT WITH OR WITHOUT PRESENCE OF ULCERS  *URINARY PROBLEMS  *BOWEL PROBLEMS  UNUSUAL RASH Items with * indicate a potential emergency and should be followed up as soon as possible.  Feel free to call the clinic should you have any questions or concerns. The clinic phone number is [phone].  Please show the CHEMO ALERT CARD at check-in to the Emergency Department and triage nurse.   

## 2017-06-09 NOTE — Progress Notes (Signed)
Per Ned Card, NP ok to treat with Mag of 1.4. He will be prescribed magnesium supplement.

## 2017-06-09 NOTE — Telephone Encounter (Signed)
Scheduled appt per 1/7 los - Gave patient AVS and calender per los.  

## 2017-06-09 NOTE — Progress Notes (Signed)
[facility] OFFICE PROGRESS NOTE   Diagnosis: Rectal cancer  INTERVAL HISTORY:   H[facility] Walker returns as scheduled.  He continues maintenance Xeloda/Panitumumab.  He denies nausea/vomiting.  No mouth sores.  No diarrhea.  He feels the skin rash is "decreasing".  He denies pain.  He has a good appetite.  Objective:  Vital signs in last 24 hours:  Blood pressure (!) 145/90, pulse 73, temperature 97.6 F (36.4 C), temperature source Oral, resp. rate 17, height 6' (1.829 m), weight 190 lb 3.2 oz (86.3 kg), SpO2 100 %.    HEENT: No thrush or ulcers. Resp: Lungs clear bilaterally. Cardio: Regular rate and rhythm. GI: Abdomen soft and nontender.  No hepatomegaly. Vascular: No leg edema.  Skin: Skin is dry appearing. Port-A-Cath without erythema.   Lab Results:  Lab Results  Component Value Date   WBC 6.1 06/09/2017   HGB 14.0 06/09/2017   HCT 42.2 06/09/2017   MCV 104.2 (H) 06/09/2017   PLT 190 06/09/2017   NEUTROABS 3.0 06/09/2017    Imaging:  No results found.  Medications: I have reviewed the patient's current medications.  Assessment/Plan: 1. Rectal cancer, invasive adenocarcinoma confirmed at colonoscopy 05/22/2015, EUS staging 06/01/2015 revealed a uT3,N1 lesion at 7 cm from the anal verge  Staging CT abdomen/pelvis 05/15/2015 confirmed a mass at the rectosigmoid colon, a suspicious perirectal lymph node, and a 6 mm right liver lesion felt to most likely be a cyst  CT chest 05/30/2015 with indeterminate pulmonary nodules and multiple liver lesions suspicious for metastases  Ultrasound 06/13/2014 with no liver lesions identified  MRI abdomen 06/21/2015 consistent with multiple liver metastases  CT biopsy of liver lesion 06/26/2015. Pathology showed metastatic adenocarcinoma consistent with colonic primary.MSI-stable, low mutation burden, no RAS or BRAFmutation  Cycle 1 FOLFOX 07/10/2015  Cycle 2 FOLFOX plus Avastin 07/24/2015 with Neulasta  support  Cycle 3 FOLFOX plus Avastin 08/07/2015  Cycle 4 FOLFOX plus Avastin 08/21/2015  cycle 5 FOLFOX plus Avastin 09/04/2015  Restaging CTs 09/15/2015 revealed a decrease in the size of liver lesions, decreased rectal primary, stable/decreased size of tiny lung nodules  Cycle 6 FOLFOX plus Avastin 09/18/2015  Cycle 7 FOLFOX plus Avastin 10/02/2015  Cycle 8 FOLFOX plus Avastin 10/16/2015  Cycle 9 FOLFOX plus Avastin 11/06/2015  Cycle 10 FOLFOX plus Avastin 11/20/2015  CTs 12/07/2015-mild decrease in wall thickening at the rectum, decrease in tiny hepatic metastases, stable lung nodules, no evidence of progressive metastatic disease, new left lower lobe infiltrate  Treatment changed to every three-week Avastin with every other week Xeloda beginning 12/11/2015  Restaging CTs 04/12/2016-interval increase in size of adjacent right upper lobe pulmonary nodules and right lower lobe pulmonary nodule; unchanged 5 mm lesion right hepatic lobe; no additional visualized hepatic metastasis. Grossly unchanged wall thickening of the rectum.  Continuation of Xeloda every other week and every three-week AvastinXeloda placed on hold 05/06/2016 due to hand-foot syndrome  Xeloda resumed 05/28/2016 1500 mg every morning and 1000 mg every afternoon 7 days on/7 days off  CT 09/27/2016-enlargement of lung nodules and new liver lesions, stable rectal mass  Cycle 1 FOLFIRI/panitumumab 10/08/2016  Cycle 2 panitumumab 10/23/2016 (FOLFIRI held secondary to neutropenia)  Cycle 3 FOLFIRI/PANITUMUMAB 11/04/2016  Cycle 4 FOLFIRI/panitumumab 11/18/2016  Cycle 5 FOLFIRI/panitumumab 12/16/2016  Cycle 6 FOLFIRI/panitumumab 12/31/2016  CTs 01/16/2017-decreased size of pulmonary nodules and liver lesions. Decreased perirectal lymph node  Cycle 7 FOLFIRI/PANITUMUMAB 01/28/2017  Cycle 8FOLFIRI/PANITUMUMAB 02/10/2017  Cycle 9 FOLFIRI/PANITUMUMAB 02/24/2017  Cycle 10 FOLFIRI/panitumumab 03/10/2017  (irinotecan  held secondary to neutropenia)  Cycle 11 FOLFIRI/panitumumab 03/24/2017  Cycle12 FOLFIRI/Panitumumab 04/07/2017  Restaging CTs 04/25/2017-stable rectal mass with adjacent 8 mm left perirectal nodal metastasis. Prior hepatic metastases not visualized on the current study. Indeterminate 16 mm enhancing lesion present in segment 6, indeterminate. Minimal residual nodularity in the right upper lobe. No new/progressive pulmonary metastases.  Maintenance Xeloda/panitumumab 04/28/2017 2.Multiple colon polyps on the colonoscopy 05/22/2015, tubular villous adenoma and a tubular adenoma were removed 3.Anemia-likely secondary to rectal bleeding.  4.Family history of multiple cancers including breast and prostate cancer 5.Transient right arm numbness 06/20/2015 6. Port-A-Cath placement 07/06/2015 by Dr. Marcello Moores 7. Mild neutropenia secondary to chemotherapy following cycle 1 FOLFOX. Neulasta added with cycle 2. 8. Delayed nausea following chemotherapy-emend added with cycle 3 FOLFOX;Emend added with cycle 12 FOLFIRI. 9. Early oxaliplatin neuropathy 10. 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 Xeloda resumed at a reduced dose. 11. Bilateral hand weakness. Question related to previous oxaliplatin. Referred to neurology 06/17/2016. 12. Hypertension-amlodipine started 07/08/2016 13. Rash secondary to PANITUMUMAB. He continues minocycline. 14. Neutropenia following cycle 1 FOLFIRI/PANITUMUMAB; neutropenia following cycle 4 FOLFIRI/PANITUMUMAB.     Disposition: [facility] Walker appears stable.  There is no clinical evidence of disease progression.  Plan to continue maintenance Xeloda/Panitumumab.  He will return for a follow-up visit and Panitumumab in 3 weeks.  He will contact the office in the interim with any problems.    Ned Card ANP/GNP-BC   06/09/2017  12:12 PM

## 2017-06-13 ENCOUNTER — Other Ambulatory Visit: Payer: Self-pay | Admitting: *Deleted

## 2017-06-13 DIAGNOSIS — C20 Malignant neoplasm of rectum: Secondary | ICD-10-CM

## 2017-06-18 ENCOUNTER — Other Ambulatory Visit: Payer: Self-pay | Admitting: Oncology

## 2017-06-18 DIAGNOSIS — C20 Malignant neoplasm of rectum: Secondary | ICD-10-CM

## 2017-06-20 ENCOUNTER — Telehealth: Payer: Self-pay | Admitting: Pharmacy Technician

## 2017-06-20 MED FILL — XELODA 500 MG TABLET: 500 | 28 days supply | Qty: 98 | Fill #0

## 2017-06-20 NOTE — Telephone Encounter (Signed)
Oral Oncology Patient Advocate Encounter  Received notification from [employer] that the patient's new insurance plan requires a prior authorization for Xeloda.  An urgent request was submitted on CoverMyMeds Key [id number] Status is pending  Oral Oncology Clinic will continue to follow.  [facility]. [name], [place] Patient [place] [phone] 06/20/2017 11:49 AM

## 2017-06-20 NOTE — Telephone Encounter (Signed)
Oral Oncology Patient Advocate Encounter  Prior Authorization for Xeloda has been approved.    Effective dates: 06/20/2017 through 06/21/2019  Oral Oncology Clinic will continue to follow.   Fabio Asa. [name], [place] Patient [place] [phone] 06/20/2017 2:42 PM

## 2017-06-23 ENCOUNTER — Telehealth: Payer: Self-pay | Admitting: Pharmacy Technician

## 2017-06-23 NOTE — Telephone Encounter (Signed)
Oral Oncology Patient Advocate Encounter  Was successful in securing patient an $ 2800 grant from Patient [facility]) to provide copayment coverage for his Xeloda.  This will keep the out of pocket expense at $0.    The billing information is as follows and has been shared with [facility].   Member ID: [id number] Group ID: [id number] RxBin: [id number] Dates of Eligibility: 03/25/2017 through 06/22/2018  [facility]. [facility], [facility] Patient [facility] [phone] 06/23/2017 10:38 AM

## 2017-06-29 ENCOUNTER — Other Ambulatory Visit: Payer: Self-pay | Admitting: Oncology

## 2017-06-30 ENCOUNTER — Inpatient Hospital Stay: Payer: Medicare HMO

## 2017-06-30 ENCOUNTER — Inpatient Hospital Stay (HOSPITAL_BASED_OUTPATIENT_CLINIC_OR_DEPARTMENT_OTHER): Payer: Medicare HMO | Admitting: Oncology

## 2017-06-30 VITALS — BP 125/80 | HR 74 | Temp 98.0°F | Resp 20 | Ht 72.0 in | Wt 190.3 lb

## 2017-06-30 DIAGNOSIS — C20 Malignant neoplasm of rectum: Secondary | ICD-10-CM

## 2017-06-30 DIAGNOSIS — Z5112 Encounter for antineoplastic immunotherapy: Secondary | ICD-10-CM | POA: Diagnosis not present

## 2017-06-30 DIAGNOSIS — R21 Rash and other nonspecific skin eruption: Secondary | ICD-10-CM | POA: Diagnosis not present

## 2017-06-30 DIAGNOSIS — C787 Secondary malignant neoplasm of liver and intrahepatic bile duct: Secondary | ICD-10-CM | POA: Diagnosis not present

## 2017-06-30 DIAGNOSIS — Z95828 Presence of other vascular implants and grafts: Secondary | ICD-10-CM

## 2017-06-30 DIAGNOSIS — Z803 Family history of malignant neoplasm of breast: Secondary | ICD-10-CM

## 2017-06-30 DIAGNOSIS — Z79899 Other long term (current) drug therapy: Secondary | ICD-10-CM | POA: Diagnosis not present

## 2017-06-30 DIAGNOSIS — D649 Anemia, unspecified: Secondary | ICD-10-CM

## 2017-06-30 DIAGNOSIS — T451X5S Adverse effect of antineoplastic and immunosuppressive drugs, sequela: Secondary | ICD-10-CM | POA: Diagnosis not present

## 2017-06-30 DIAGNOSIS — Z8042 Family history of malignant neoplasm of prostate: Secondary | ICD-10-CM

## 2017-06-30 LAB — MAGNESIUM: Magnesium: 1.5 mg/dL (ref 1.5–2.5)

## 2017-06-30 LAB — CBC WITH DIFFERENTIAL (CANCER CENTER ONLY)
BASOS ABS: 0 10*3/uL (ref 0.0–0.1)
BASOS PCT: 1 %
EOS PCT: 3 %
Eosinophils Absolute: 0.2 10*3/uL (ref 0.0–0.5)
HCT: 45.2 % (ref 38.4–49.9)
Hemoglobin: 15 g/dL (ref 13.0–17.1)
LYMPHS PCT: 39 %
Lymphs Abs: 2 10*3/uL (ref 0.9–3.3)
MCH: 34.8 pg — ABNORMAL HIGH (ref 27.2–33.4)
MCHC: 33.1 g/dL (ref 32.0–36.0)
MCV: 105.3 fL — AB (ref 79.3–98.0)
Monocytes Absolute: 0.4 10*3/uL (ref 0.1–0.9)
Monocytes Relative: 8 %
Neutro Abs: 2.5 10*3/uL (ref 1.5–6.5)
Neutrophils Relative %: 49 %
PLATELETS: 189 10*3/uL (ref 140–400)
RBC: 4.29 MIL/uL (ref 4.20–5.82)
RDW: 19.2 % — AB (ref 11.0–15.6)
WBC Count: 5.1 10*3/uL (ref 4.0–10.3)

## 2017-06-30 LAB — CMP (CANCER CENTER ONLY)
ALBUMIN: 3.7 g/dL (ref 3.5–5.0)
ALT: 19 U/L (ref 0–55)
AST: 23 U/L (ref 5–34)
Alkaline Phosphatase: 137 U/L (ref 40–150)
Anion gap: 9 (ref 3–11)
BUN: 12 mg/dL (ref 7–26)
CHLORIDE: 108 mmol/L (ref 98–109)
CO2: 25 mmol/L (ref 22–29)
CREATININE: 1.18 mg/dL (ref 0.70–1.30)
Calcium: 9.1 mg/dL (ref 8.4–10.4)
GFR, Est AFR Am: >60 mL/min (ref >60)
GLUCOSE: 101 mg/dL (ref 70–140)
POTASSIUM: 3.9 mmol/L (ref 3.5–5.1)
SODIUM: 142 mmol/L (ref 136–145)
Total Bilirubin: 0.4 mg/dL (ref 0.2–1.2)
Total Protein: 7.5 g/dL (ref 6.4–8.3)

## 2017-06-30 LAB — CEA (IN HOUSE-CHCC): CEA (CHCC-In House): 1.29 ng/mL (ref 0.00–5.00)

## 2017-06-30 MED ORDER — SODIUM CHLORIDE 0.9 % IV SOLN
6.0000 mg/kg | Freq: Once | INTRAVENOUS | Status: AC
  Administered 2017-06-30: 500 mg via INTRAVENOUS
  Filled 2017-06-30: qty 5

## 2017-06-30 MED ORDER — SODIUM CHLORIDE 0.9% FLUSH
10.0000 mL | INTRAVENOUS | Status: DC | PRN
  Administered 2017-06-30: 10 mL
  Filled 2017-06-30: qty 10

## 2017-06-30 MED ORDER — HEPARIN SOD (PORK) LOCK FLUSH 100 UNIT/ML IV SOLN
500.0000 [IU] | Freq: Once | INTRAVENOUS | Status: AC | PRN
  Administered 2017-06-30: 500 [IU]
  Filled 2017-06-30: qty 5

## 2017-06-30 MED ORDER — SODIUM CHLORIDE 0.9 % IV SOLN
Freq: Once | INTRAVENOUS | Status: AC
  Administered 2017-06-30: 10:00:00 via INTRAVENOUS

## 2017-06-30 MED ORDER — SODIUM CHLORIDE 0.9 % IJ SOLN
10.0000 mL | INTRAMUSCULAR | Status: DC | PRN
  Administered 2017-06-30: 10 mL via INTRAVENOUS
  Filled 2017-06-30: qty 10

## 2017-06-30 NOTE — Progress Notes (Signed)
Reviewed pt labs with Dr. Benay Spice and pt ok to treat with magnesium of 1.5

## 2017-06-30 NOTE — Progress Notes (Signed)
[place] OFFICE PROGRESS NOTE   Diagnosis: Rectal cancer  INTERVAL HISTORY:   Krammes returns as scheduled.  He continues Xeloda/Panitumumab.  No mouth sores, diarrhea, or hand/foot pain.  Stable skin dryness despite using moisturizers.  He reports feeling "weak ".  No shortness of breath.  Good appetite.  Objective:  Vital signs in last 24 hours:  Blood pressure 125/80, pulse 74, temperature 98 F (36.7 C), temperature source Oral, resp. rate 20, height 6' (1.829 m), weight 190 lb 4.8 oz (86.3 kg), SpO2 100 %.    HEENT: Hyperpigmentation, no thrush or ulcers Resp: Lungs clear bilaterally Cardio: Regular rate and rhythm GI: No hepatosplenomegaly, nontender Vascular: No leg edema  Skin: Diffuse dry desquamation over the trunk, hands without erythema or skin breakdown  Portacath/PICC-without erythema  Lab Results:  Lab Results  Component Value Date   WBC 5.1 06/30/2017   HGB 14.0 06/09/2017   HCT 45.2 06/30/2017   MCV 105.3 (H) 06/30/2017   PLT 189 06/30/2017   NEUTROABS 2.5 06/30/2017    CMP     Component Value Date/Time   NA 142 06/30/2017 0743   NA 140 05/19/2017 0858   K 3.9 06/30/2017 0743   K 4.1 05/19/2017 0858   CL 108 06/30/2017 0743   CO2 25 06/30/2017 0743   CO2 23 05/19/2017 0858   GLUCOSE 101 06/30/2017 0743   GLUCOSE 80 05/19/2017 0858   BUN 12 06/30/2017 0743   BUN 14.4 05/19/2017 0858   CREATININE 1.03 06/09/2017 1121   CREATININE 1.0 05/19/2017 0858   CALCIUM 9.1 06/30/2017 0743   CALCIUM 9.0 05/19/2017 0858   PROT 7.5 06/30/2017 0743   PROT 7.1 05/19/2017 0858   ALBUMIN 3.7 06/30/2017 0743   ALBUMIN 3.5 05/19/2017 0858   AST 23 06/30/2017 0743   AST 21 05/19/2017 0858   ALT 19 06/30/2017 0743   ALT 16 05/19/2017 0858   ALKPHOS 137 06/30/2017 0743   ALKPHOS 131 05/19/2017 0858   BILITOT 0.4 06/30/2017 0743   BILITOT 0.57 05/19/2017 0858   GFRNONAA >60 06/30/2017 0743   GFRAA >60 06/30/2017 0743    Lab Results    Component Value Date   CEA1 1.27 05/19/2017     Medications: I have reviewed the patient's current medications.   Assessment/Plan: 1. Rectal cancer, invasive adenocarcinoma confirmed at colonoscopy 05/22/2015, EUS staging 06/01/2015 revealed a uT3,N1 lesion at 7 cm from the anal verge  Staging CT abdomen/pelvis 05/15/2015 confirmed a mass at the rectosigmoid colon, a suspicious perirectal lymph node, and a 6 mm right liver lesion felt to most likely be a cyst  CT chest 05/30/2015 with indeterminate pulmonary nodules and multiple liver lesions suspicious for metastases  Ultrasound 06/13/2014 with no liver lesions identified  MRI abdomen 06/21/2015 consistent with multiple liver metastases  CT biopsy of liver lesion 06/26/2015. Pathology showed metastatic adenocarcinoma consistent with colonic primary.MSI-stable, low mutation burden, no RAS or BRAFmutation  Cycle 1 FOLFOX 07/10/2015  Cycle 2 FOLFOX plus Avastin 07/24/2015 with Neulasta support  Cycle 3 FOLFOX plus Avastin 08/07/2015  Cycle 4 FOLFOX plus Avastin 08/21/2015  cycle 5 FOLFOX plus Avastin 09/04/2015  Restaging CTs 09/15/2015 revealed a decrease in the size of liver lesions, decreased rectal primary, stable/decreased size of tiny lung nodules  Cycle 6 FOLFOX plus Avastin 09/18/2015  Cycle 7 FOLFOX plus Avastin 10/02/2015  Cycle 8 FOLFOX plus Avastin 10/16/2015  Cycle 9 FOLFOX plus Avastin 11/06/2015  Cycle 10 FOLFOX plus Avastin 11/20/2015  CTs 12/07/2015-mild decrease in  wall thickening at the rectum, decrease in tiny hepatic metastases, stable lung nodules, no evidence of progressive metastatic disease, new left lower lobe infiltrate  Treatment changed to every three-week Avastin with every other week Xeloda beginning 12/11/2015  Restaging CTs 04/12/2016-interval increase in size of adjacent right upper lobe pulmonary nodules and right lower lobe pulmonary nodule; unchanged 5 mm lesion right hepatic  lobe; no additional visualized hepatic metastasis. Grossly unchanged wall thickening of the rectum.  Continuation of Xeloda every other week and every three-week AvastinXeloda placed on hold 05/06/2016 due to hand-foot syndrome  Xeloda resumed 05/28/2016 1500 mg every morning and 1000 mg every afternoon 7 days on/7 days off  CT 09/27/2016-enlargement of lung nodules and new liver lesions, stable rectal mass  Cycle 1 FOLFIRI/panitumumab 10/08/2016  Cycle 2 panitumumab 10/23/2016 (FOLFIRI held secondary to neutropenia)  Cycle 3 FOLFIRI/PANITUMUMAB 11/04/2016  Cycle 4 FOLFIRI/panitumumab 11/18/2016  Cycle 5 FOLFIRI/panitumumab 12/16/2016  Cycle 6 FOLFIRI/panitumumab 12/31/2016  CTs 01/16/2017-decreased size of pulmonary nodules and liver lesions. Decreased perirectal lymph node  Cycle 7 FOLFIRI/PANITUMUMAB 01/28/2017  Cycle 8FOLFIRI/PANITUMUMAB 02/10/2017  Cycle 9 FOLFIRI/PANITUMUMAB 02/24/2017  Cycle 10 FOLFIRI/panitumumab 03/10/2017 (irinotecan held secondary to neutropenia)  Cycle 11 FOLFIRI/panitumumab 03/24/2017  Cycle12 FOLFIRI/Panitumumab 04/07/2017  Restaging CTs 04/25/2017-stable rectal mass with adjacent 8 mm left perirectal nodal metastasis. Prior hepatic metastases not visualized on the current study. Indeterminate 16 mm enhancing lesion present in segment 6, indeterminate. Minimal residual nodularity in the right upper lobe. No new/progressive pulmonary metastases.  Maintenance Xeloda/panitumumab 04/28/2017 2.Multiple colon polyps on the colonoscopy 05/22/2015, tubular villous adenoma and a tubular adenoma were removed 3.Anemia-likely secondary to rectal bleeding.  4.Family history of multiple cancers including breast and prostate cancer 5.Transient right arm numbness 06/20/2015 6. Port-A-Cath placement 07/06/2015 by Dr. Marcello Moores 7. Mild neutropenia secondary to chemotherapy following cycle 1 FOLFOX. Neulasta added with cycle 2. 8. Delayed nausea following  chemotherapy-emend added with cycle 3 FOLFOX;Emend added with cycle 12 FOLFIRI. 9. Early oxaliplatin neuropathy 10. 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 Xeloda resumed at a reduced dose. 11. Bilateral hand weakness. Question related to previous oxaliplatin. Referred to neurology 06/17/2016. 12. Hypertension-amlodipine started 07/08/2016 13. Rash secondary to PANITUMUMAB. He continues minocycline. 14. Neutropenia following cycle 1 FOLFIRI/PANITUMUMAB; neutropenia following cycle 4 FOLFIRI/PANITUMUMAB.   Disposition: [name] appears stable.  He is tolerating the Xeloda/panitumumab well.  He will return for an office visit and chemotherapy in 3 weeks.  He will contact us for progression of the malaise.  15 minutes were spent with the patient today.  The majority of the time was used for counseling and coordination of care.  Betsy Coder, MD  06/30/2017  8:44 AM

## 2017-06-30 NOTE — Patient Instructions (Signed)
[facility] Discharge Instructions for Patients Receiving Chemotherapy  Today you received the following chemotherapy agents: Vectibix.  To help prevent nausea and vomiting after your treatment, we encourage you to take your nausea medication as directed.   If you develop nausea and vomiting that is not controlled by your nausea medication, call the clinic.   BELOW ARE SYMPTOMS THAT SHOULD BE REPORTED IMMEDIATELY:  *FEVER GREATER THAN 100.5 F  *CHILLS WITH OR WITHOUT FEVER  NAUSEA AND VOMITING THAT IS NOT CONTROLLED WITH YOUR NAUSEA MEDICATION  *UNUSUAL SHORTNESS OF BREATH  *UNUSUAL BRUISING OR BLEEDING  TENDERNESS IN MOUTH AND THROAT WITH OR WITHOUT PRESENCE OF ULCERS  *URINARY PROBLEMS  *BOWEL PROBLEMS  UNUSUAL RASH Items with * indicate a potential emergency and should be followed up as soon as possible.  Feel free to call the clinic should you have any questions or concerns. The clinic phone number is [phone].  Please show the CHEMO ALERT CARD at check-in to the Emergency Department and triage nurse.   

## 2017-07-07 ENCOUNTER — Other Ambulatory Visit: Payer: Self-pay | Admitting: Pharmacist

## 2017-07-14 ENCOUNTER — Other Ambulatory Visit: Payer: Self-pay | Admitting: Oncology

## 2017-07-14 DIAGNOSIS — C20 Malignant neoplasm of rectum: Secondary | ICD-10-CM

## 2017-07-17 ENCOUNTER — Other Ambulatory Visit: Payer: Self-pay | Admitting: Emergency Medicine

## 2017-07-17 DIAGNOSIS — C20 Malignant neoplasm of rectum: Secondary | ICD-10-CM

## 2017-07-17 MED ORDER — MINOCYCLINE HCL 100 MG PO CAPS: 100.0000 mg | ORAL_CAPSULE | Freq: Two times a day (BID) | ORAL | 2 refills | Status: DC

## 2017-07-17 MED FILL — CAPECITABINE 500 MG TABLET: 500 | 28 days supply | Qty: 98 | Fill #0

## 2017-07-20 ENCOUNTER — Other Ambulatory Visit: Payer: Self-pay | Admitting: Oncology

## 2017-07-21 ENCOUNTER — Inpatient Hospital Stay: Payer: Medicare HMO | Attending: Nurse Practitioner

## 2017-07-21 ENCOUNTER — Inpatient Hospital Stay: Payer: Medicare HMO

## 2017-07-21 ENCOUNTER — Inpatient Hospital Stay (HOSPITAL_BASED_OUTPATIENT_CLINIC_OR_DEPARTMENT_OTHER): Payer: Medicare HMO | Admitting: Oncology

## 2017-07-21 ENCOUNTER — Telehealth: Payer: Self-pay | Admitting: Oncology

## 2017-07-21 VITALS — BP 128/97 | HR 76 | Temp 98.5°F | Resp 18 | Ht 72.0 in | Wt 190.2 lb

## 2017-07-21 DIAGNOSIS — R21 Rash and other nonspecific skin eruption: Secondary | ICD-10-CM | POA: Diagnosis not present

## 2017-07-21 DIAGNOSIS — Z8042 Family history of malignant neoplasm of prostate: Secondary | ICD-10-CM | POA: Diagnosis not present

## 2017-07-21 DIAGNOSIS — Z803 Family history of malignant neoplasm of breast: Secondary | ICD-10-CM | POA: Diagnosis not present

## 2017-07-21 DIAGNOSIS — C20 Malignant neoplasm of rectum: Secondary | ICD-10-CM

## 2017-07-21 DIAGNOSIS — G620 Drug-induced polyneuropathy: Secondary | ICD-10-CM | POA: Insufficient documentation

## 2017-07-21 DIAGNOSIS — C7800 Secondary malignant neoplasm of unspecified lung: Secondary | ICD-10-CM | POA: Diagnosis not present

## 2017-07-21 DIAGNOSIS — C787 Secondary malignant neoplasm of liver and intrahepatic bile duct: Secondary | ICD-10-CM | POA: Diagnosis not present

## 2017-07-21 DIAGNOSIS — D630 Anemia in neoplastic disease: Secondary | ICD-10-CM

## 2017-07-21 DIAGNOSIS — D701 Agranulocytosis secondary to cancer chemotherapy: Secondary | ICD-10-CM | POA: Diagnosis not present

## 2017-07-21 DIAGNOSIS — Z86010 Personal history of colonic polyps: Secondary | ICD-10-CM

## 2017-07-21 DIAGNOSIS — Z5112 Encounter for antineoplastic immunotherapy: Secondary | ICD-10-CM | POA: Insufficient documentation

## 2017-07-21 DIAGNOSIS — Z95828 Presence of other vascular implants and grafts: Secondary | ICD-10-CM

## 2017-07-21 LAB — CMP (CANCER CENTER ONLY)
ALBUMIN: 3.9 g/dL (ref 3.5–5.0)
ALT: 19 U/L (ref 0–55)
AST: 25 U/L (ref 5–34)
Alkaline Phosphatase: 126 U/L (ref 40–150)
Anion gap: 9 (ref 3–11)
BUN: 11 mg/dL (ref 7–26)
CHLORIDE: 107 mmol/L (ref 98–109)
CO2: 25 mmol/L (ref 22–29)
CREATININE: 1.09 mg/dL (ref 0.70–1.30)
Calcium: 9.1 mg/dL (ref 8.4–10.4)
GFR, Est AFR Am: >60 mL/min (ref >60)
GFR, Estimated: >60 mL/min (ref >60)
GLUCOSE: 113 mg/dL (ref 70–140)
POTASSIUM: 4.1 mmol/L (ref 3.5–5.1)
Sodium: 141 mmol/L (ref 136–145)
Total Bilirubin: 0.7 mg/dL (ref 0.2–1.2)
Total Protein: 7.6 g/dL (ref 6.4–8.3)

## 2017-07-21 LAB — CBC WITH DIFFERENTIAL (CANCER CENTER ONLY)
Basophils Absolute: 0 10*3/uL (ref 0.0–0.1)
Basophils Relative: 1 %
EOS ABS: 0.1 10*3/uL (ref 0.0–0.5)
EOS PCT: 3 %
HCT: 45.2 % (ref 38.4–49.9)
Hemoglobin: 15 g/dL (ref 13.0–17.1)
LYMPHS ABS: 1.9 10*3/uL (ref 0.9–3.3)
LYMPHS PCT: 41 %
MCH: 35.4 pg — AB (ref 27.2–33.4)
MCHC: 33.2 g/dL (ref 32.0–36.0)
MCV: 106.6 fL — AB (ref 79.3–98.0)
MONO ABS: 0.4 10*3/uL (ref 0.1–0.9)
MONOS PCT: 8 %
Neutro Abs: 2.2 10*3/uL (ref 1.5–6.5)
Neutrophils Relative %: 47 %
PLATELETS: 173 10*3/uL (ref 140–400)
RBC: 4.24 MIL/uL (ref 4.20–5.82)
RDW: 18 % — ABNORMAL HIGH (ref 11.0–14.6)
WBC: 4.7 10*3/uL (ref 4.0–10.3)

## 2017-07-21 LAB — MAGNESIUM: MAGNESIUM: 1.8 mg/dL (ref 1.5–2.5)

## 2017-07-21 MED ORDER — SODIUM CHLORIDE 0.9 % IV SOLN
6.0000 mg/kg | Freq: Once | INTRAVENOUS | Status: AC
  Administered 2017-07-21: 500 mg via INTRAVENOUS
  Filled 2017-07-21: qty 20

## 2017-07-21 MED ORDER — SODIUM CHLORIDE 0.9 % IJ SOLN
10.0000 mL | INTRAMUSCULAR | Status: DC | PRN
  Administered 2017-07-21: 10 mL via INTRAVENOUS
  Filled 2017-07-21: qty 10

## 2017-07-21 MED ORDER — HEPARIN SOD (PORK) LOCK FLUSH 100 UNIT/ML IV SOLN
500.0000 [IU] | Freq: Once | INTRAVENOUS | Status: AC | PRN
  Administered 2017-07-21: 500 [IU]
  Filled 2017-07-21: qty 5

## 2017-07-21 MED ORDER — SODIUM CHLORIDE 0.9 % IV SOLN
Freq: Once | INTRAVENOUS | Status: AC
  Administered 2017-07-21: 12:00:00 via INTRAVENOUS

## 2017-07-21 MED ORDER — SODIUM CHLORIDE 0.9% FLUSH
10.0000 mL | INTRAVENOUS | Status: DC | PRN
  Administered 2017-07-21: 10 mL
  Filled 2017-07-21: qty 10

## 2017-07-21 NOTE — Progress Notes (Signed)
[place] OFFICE PROGRESS NOTE   Diagnosis: Rectal cancer  INTERVAL HISTORY:   [name] returns as scheduled.  He continues Xeloda/Panitumumab.  No mouth sores or diarrhea.  No bleeding.  He has diffuse skin dryness.  No pain at the hands or feet.  Objective:  Vital signs in last 24 hours:  Blood pressure (!) 128/97, pulse 76, temperature 98.5 F (36.9 C), temperature source Oral, resp. rate 18, height 6' (1.829 m), weight 190 lb 3.2 oz (86.3 kg), SpO2 100 %.    HEENT: No thrush or ulcers Resp: Lungs clear bilaterally Cardio: Regular rate and rhythm GI: No hepatomegaly, nontender Vascular: No leg edema  Skin: Diffuse dry desquamation over the trunk.  Dryness and hyperpigmentation of the soles  Portacath/PICC-without erythema  Lab Results:  Lab Results  Component Value Date   WBC 4.7 07/21/2017   HGB 14.0 06/09/2017   HCT 45.2 07/21/2017   MCV 106.6 (H) 07/21/2017   PLT 173 07/21/2017   NEUTROABS 2.2 07/21/2017    CMP     Component Value Date/Time   NA 141 07/21/2017 0919   NA 140 05/19/2017 0858   K 4.1 07/21/2017 0919   K 4.1 05/19/2017 0858   CL 107 07/21/2017 0919   CO2 25 07/21/2017 0919   CO2 23 05/19/2017 0858   GLUCOSE 113 07/21/2017 0919   GLUCOSE 80 05/19/2017 0858   BUN 11 07/21/2017 0919   BUN 14.4 05/19/2017 0858   CREATININE 1.09 07/21/2017 0919   CREATININE 1.0 05/19/2017 0858   CALCIUM 9.1 07/21/2017 0919   CALCIUM 9.0 05/19/2017 0858   PROT 7.6 07/21/2017 0919   PROT 7.1 05/19/2017 0858   ALBUMIN 3.9 07/21/2017 0919   ALBUMIN 3.5 05/19/2017 0858   AST 25 07/21/2017 0919   AST 21 05/19/2017 0858   ALT 19 07/21/2017 0919   ALT 16 05/19/2017 0858   ALKPHOS 126 07/21/2017 0919   ALKPHOS 131 05/19/2017 0858   BILITOT 0.7 07/21/2017 0919   BILITOT 0.57 05/19/2017 0858   GFRNONAA >60 07/21/2017 0919   GFRAA >60 07/21/2017 0919    Lab Results  Component Value Date   CEA1 1.29 06/30/2017    Medications: I have  reviewed the patient's current medications.   Assessment/Plan: 1. Rectal cancer, invasive adenocarcinoma confirmed at colonoscopy 05/22/2015, EUS staging 06/01/2015 revealed a uT3,N1 lesion at 7 cm from the anal verge  Staging CT abdomen/pelvis 05/15/2015 confirmed a mass at the rectosigmoid colon, a suspicious perirectal lymph node, and a 6 mm right liver lesion felt to most likely be a cyst  CT chest 05/30/2015 with indeterminate pulmonary nodules and multiple liver lesions suspicious for metastases  Ultrasound 06/13/2014 with no liver lesions identified  MRI abdomen 06/21/2015 consistent with multiple liver metastases  CT biopsy of liver lesion 06/26/2015. Pathology showed metastatic adenocarcinoma consistent with colonic primary.MSI-stable, low mutation burden, no RAS or BRAFmutation  Cycle 1 FOLFOX 07/10/2015  Cycle 2 FOLFOX plus Avastin 07/24/2015 with Neulasta support  Cycle 3 FOLFOX plus Avastin 08/07/2015  Cycle 4 FOLFOX plus Avastin 08/21/2015  cycle 5 FOLFOX plus Avastin 09/04/2015  Restaging CTs 09/15/2015 revealed a decrease in the size of liver lesions, decreased rectal primary, stable/decreased size of tiny lung nodules  Cycle 6 FOLFOX plus Avastin 09/18/2015  Cycle 7 FOLFOX plus Avastin 10/02/2015  Cycle 8 FOLFOX plus Avastin 10/16/2015  Cycle 9 FOLFOX plus Avastin 11/06/2015  Cycle 10 FOLFOX plus Avastin 11/20/2015  CTs 12/07/2015-mild decrease in wall thickening at the rectum, decrease  in tiny hepatic metastases, stable lung nodules, no evidence of progressive metastatic disease, new left lower lobe infiltrate  Treatment changed to every three-week Avastin with every other week Xeloda beginning 12/11/2015  Restaging CTs 04/12/2016-interval increase in size of adjacent right upper lobe pulmonary nodules and right lower lobe pulmonary nodule; unchanged 5 mm lesion right hepatic lobe; no additional visualized hepatic metastasis. Grossly unchanged wall  thickening of the rectum.  Continuation of Xeloda every other week and every three-week AvastinXeloda placed on hold 05/06/2016 due to hand-foot syndrome  Xeloda resumed 05/28/2016 1500 mg every morning and 1000 mg every afternoon 7 days on/7 days off  CT 09/27/2016-enlargement of lung nodules and new liver lesions, stable rectal mass  Cycle 1 FOLFIRI/panitumumab 10/08/2016  Cycle 2 panitumumab 10/23/2016 (FOLFIRI held secondary to neutropenia)  Cycle 3 FOLFIRI/PANITUMUMAB 11/04/2016  Cycle 4 FOLFIRI/panitumumab 11/18/2016  Cycle 5 FOLFIRI/panitumumab 12/16/2016  Cycle 6 FOLFIRI/panitumumab 12/31/2016  CTs 01/16/2017-decreased size of pulmonary nodules and liver lesions. Decreased perirectal lymph node  Cycle 7 FOLFIRI/PANITUMUMAB 01/28/2017  Cycle 8FOLFIRI/PANITUMUMAB 02/10/2017  Cycle 9 FOLFIRI/PANITUMUMAB 02/24/2017  Cycle 10 FOLFIRI/panitumumab 03/10/2017 (irinotecan held secondary to neutropenia)  Cycle 11 FOLFIRI/panitumumab 03/24/2017  Cycle12 FOLFIRI/Panitumumab 04/07/2017  Restaging CTs 04/25/2017-stable rectal mass with adjacent 8 mm left perirectal nodal metastasis. Prior hepatic metastases not visualized on the current study. Indeterminate 16 mm enhancing lesion present in segment 6, indeterminate. Minimal residual nodularity in the right upper lobe. No new/progressive pulmonary metastases.  Maintenance Xeloda/panitumumab 04/28/2017 2.Multiple colon polyps on the colonoscopy 05/22/2015, tubular villous adenoma and a tubular adenoma were removed 3.Anemia-likely secondary to rectal bleeding.  4.Family history of multiple cancers including breast and prostate cancer 5.Transient right arm numbness 06/20/2015 6. Port-A-Cath placement 07/06/2015 by Dr. Marcello Moores 7. Mild neutropenia secondary to chemotherapy following cycle 1 FOLFOX. Neulasta added with cycle 2. 8. Delayed nausea following chemotherapy-emend added with cycle 3 FOLFOX;Emend added with cycle 12  FOLFIRI. 9. Early oxaliplatin neuropathy 10. 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 Xeloda resumed at a reduced dose. 11. Bilateral hand weakness. Question related to previous oxaliplatin. Referred to neurology 06/17/2016. 12. Hypertension-amlodipine started 07/08/2016 13. Rash secondary to PANITUMUMAB. He continues minocycline. 14. Neutropenia following cycle 1 FOLFIRI/PANITUMUMAB; neutropenia following cycle 4 FOLFIRI/PANITUMUMAB.   Disposition: [name] appears stable.  The plan is to continue Xeloda/Panitumumab.  We will plan for a restaging CT evaluation prior to the office visit 09/01/2017.  15 minutes were spent with the patient today.  The majority of the time was used for counseling and coordination of care.  Betsy Coder, MD  07/21/2017  11:15 AM

## 2017-07-21 NOTE — Telephone Encounter (Signed)
Scheduled appt per 2/18 los - Gave patient aVS and calender per los.

## 2017-07-21 NOTE — Patient Instructions (Signed)
[facility] Discharge Instructions for Patients Receiving Chemotherapy  Today you received the following chemotherapy agents: Vectibix.  To help prevent nausea and vomiting after your treatment, we encourage you to take your nausea medication as directed.   If you develop nausea and vomiting that is not controlled by your nausea medication, call the clinic.   BELOW ARE SYMPTOMS THAT SHOULD BE REPORTED IMMEDIATELY:  *FEVER GREATER THAN 100.5 F  *CHILLS WITH OR WITHOUT FEVER  NAUSEA AND VOMITING THAT IS NOT CONTROLLED WITH YOUR NAUSEA MEDICATION  *UNUSUAL SHORTNESS OF BREATH  *UNUSUAL BRUISING OR BLEEDING  TENDERNESS IN MOUTH AND THROAT WITH OR WITHOUT PRESENCE OF ULCERS  *URINARY PROBLEMS  *BOWEL PROBLEMS  UNUSUAL RASH Items with * indicate a potential emergency and should be followed up as soon as possible.  Feel free to call the clinic should you have any questions or concerns. The clinic phone number is [phone].  Please show the CHEMO ALERT CARD at check-in to the Emergency Department and triage nurse.   

## 2017-08-07 ENCOUNTER — Other Ambulatory Visit: Payer: Self-pay | Admitting: Oncology

## 2017-08-07 DIAGNOSIS — C20 Malignant neoplasm of rectum: Secondary | ICD-10-CM

## 2017-08-11 ENCOUNTER — Telehealth: Payer: Self-pay

## 2017-08-11 ENCOUNTER — Inpatient Hospital Stay: Payer: Medicare HMO

## 2017-08-11 ENCOUNTER — Inpatient Hospital Stay: Payer: Medicare HMO | Attending: Nurse Practitioner

## 2017-08-11 ENCOUNTER — Encounter: Payer: Self-pay | Admitting: Nurse Practitioner

## 2017-08-11 ENCOUNTER — Inpatient Hospital Stay (HOSPITAL_BASED_OUTPATIENT_CLINIC_OR_DEPARTMENT_OTHER): Payer: Medicare HMO | Admitting: Nurse Practitioner

## 2017-08-11 ENCOUNTER — Encounter: Payer: Self-pay | Admitting: *Deleted

## 2017-08-11 VITALS — BP 119/83 | HR 74 | Temp 97.9°F | Resp 18 | Ht 72.0 in | Wt 190.4 lb

## 2017-08-11 DIAGNOSIS — R21 Rash and other nonspecific skin eruption: Secondary | ICD-10-CM | POA: Insufficient documentation

## 2017-08-11 DIAGNOSIS — C20 Malignant neoplasm of rectum: Secondary | ICD-10-CM

## 2017-08-11 DIAGNOSIS — D701 Agranulocytosis secondary to cancer chemotherapy: Secondary | ICD-10-CM

## 2017-08-11 DIAGNOSIS — L853 Xerosis cutis: Secondary | ICD-10-CM

## 2017-08-11 DIAGNOSIS — C787 Secondary malignant neoplasm of liver and intrahepatic bile duct: Secondary | ICD-10-CM | POA: Diagnosis not present

## 2017-08-11 DIAGNOSIS — C7800 Secondary malignant neoplasm of unspecified lung: Secondary | ICD-10-CM | POA: Insufficient documentation

## 2017-08-11 DIAGNOSIS — Z95828 Presence of other vascular implants and grafts: Secondary | ICD-10-CM

## 2017-08-11 DIAGNOSIS — K5900 Constipation, unspecified: Secondary | ICD-10-CM | POA: Insufficient documentation

## 2017-08-11 DIAGNOSIS — Z8042 Family history of malignant neoplasm of prostate: Secondary | ICD-10-CM | POA: Diagnosis not present

## 2017-08-11 DIAGNOSIS — K6289 Other specified diseases of anus and rectum: Secondary | ICD-10-CM | POA: Diagnosis not present

## 2017-08-11 DIAGNOSIS — Z5112 Encounter for antineoplastic immunotherapy: Secondary | ICD-10-CM | POA: Diagnosis present

## 2017-08-11 LAB — CBC WITH DIFFERENTIAL (CANCER CENTER ONLY)
BASOS ABS: 0 10*3/uL (ref 0.0–0.1)
BASOS PCT: 1 %
Eosinophils Absolute: 0.2 10*3/uL (ref 0.0–0.5)
Eosinophils Relative: 4 %
HEMATOCRIT: 44.8 % (ref 38.4–49.9)
Hemoglobin: 14.9 g/dL (ref 13.0–17.1)
Lymphocytes Relative: 37 %
Lymphs Abs: 1.7 10*3/uL (ref 0.9–3.3)
MCH: 36.2 pg — ABNORMAL HIGH (ref 27.2–33.4)
MCHC: 33.3 g/dL (ref 32.0–36.0)
MCV: 108.8 fL — ABNORMAL HIGH (ref 79.3–98.0)
MONO ABS: 0.4 10*3/uL (ref 0.1–0.9)
Monocytes Relative: 8 %
NEUTROS ABS: 2.3 10*3/uL (ref 1.5–6.5)
Neutrophils Relative %: 50 %
Platelet Count: 168 10*3/uL (ref 140–400)
RBC: 4.12 MIL/uL — ABNORMAL LOW (ref 4.20–5.82)
RDW: 17.9 % — AB (ref 11.0–14.6)
WBC Count: 4.6 10*3/uL (ref 4.0–10.3)

## 2017-08-11 LAB — CMP (CANCER CENTER ONLY)
ALK PHOS: 115 U/L (ref 40–150)
ALT: 21 U/L (ref 0–55)
ANION GAP: 8 (ref 3–11)
AST: 23 U/L (ref 5–34)
Albumin: 3.7 g/dL (ref 3.5–5.0)
BUN: 13 mg/dL (ref 7–26)
CALCIUM: 8.8 mg/dL (ref 8.4–10.4)
CO2: 26 mmol/L (ref 22–29)
Chloride: 107 mmol/L (ref 98–109)
Creatinine: 1.09 mg/dL (ref 0.70–1.30)
GFR, Est AFR Am: >60 mL/min (ref >60)
GFR, Estimated: >60 mL/min (ref >60)
Glucose, Bld: 110 mg/dL (ref 70–140)
POTASSIUM: 3.9 mmol/L (ref 3.5–5.1)
SODIUM: 141 mmol/L (ref 136–145)
TOTAL PROTEIN: 7.4 g/dL (ref 6.4–8.3)
Total Bilirubin: 0.6 mg/dL (ref 0.2–1.2)

## 2017-08-11 LAB — CEA (IN HOUSE-CHCC): CEA (CHCC-In House): 3.91 ng/mL (ref 0.00–5.00)

## 2017-08-11 LAB — MAGNESIUM: Magnesium: 1.2 mg/dL — ABNORMAL LOW (ref 1.7–2.4)

## 2017-08-11 MED ORDER — HEPARIN SOD (PORK) LOCK FLUSH 100 UNIT/ML IV SOLN
500.0000 [IU] | Freq: Once | INTRAVENOUS | Status: AC | PRN
  Administered 2017-08-11: 500 [IU] via INTRAVENOUS
  Filled 2017-08-11: qty 5

## 2017-08-11 MED ORDER — SODIUM CHLORIDE 0.9 % IJ SOLN
10.0000 mL | INTRAMUSCULAR | Status: DC | PRN
  Administered 2017-08-11: 10 mL via INTRAVENOUS
  Filled 2017-08-11: qty 10

## 2017-08-11 NOTE — Progress Notes (Unsigned)
Critical magnesium 1.2 given to Wylene Simmer (RN) by Mkerr (MT) at 1113am 08/11/2017

## 2017-08-11 NOTE — Telephone Encounter (Signed)
Infussion will print avs and calender of upcoming appointment. Per 3/11 los

## 2017-08-11 NOTE — Progress Notes (Signed)
[facility] OFFICE PROGRESS NOTE   Diagnosis: Rectal cancer  INTERVAL HISTORY:   [name] returns as scheduled.  He continues Xeloda/Panitumumab.  He feels well.  Skin rash is better.  He continues to note diffuse dry skin.  Some peeling skin on the hands and feet.  No associated pain.  He denies nausea/vomiting.  No mouth sores.  No diarrhea.  He has a good appetite.  Objective:  Vital signs in last 24 hours:  Blood pressure 119/83, pulse 74, temperature 97.9 F (36.6 C), temperature source Oral, resp. rate 18, height 6' (1.829 m), weight 190 lb 6.4 oz (86.4 kg), SpO2 100 %.    HEENT: No thrush or ulcers. Resp: Lungs clear bilaterally. Cardio: Regular rate and rhythm. GI: Abdomen soft and nontender.  No hepatomegaly. Vascular: No leg edema. Neuro: Alert and oriented. Skin: Palms with hyperpigmentation, skin thickening.  No skin breakdown.  Skin in general has a dry appearance.  No significant facial rash. Port-A-Cath without erythema.   Lab Results:  Lab Results  Component Value Date   WBC 4.6 08/11/2017   HGB 14.0 06/09/2017   HCT 44.8 08/11/2017   MCV 108.8 (H) 08/11/2017   PLT 168 08/11/2017   NEUTROABS 2.3 08/11/2017    Imaging:  No results found.  Medications: I have reviewed the patient's current medications.  Assessment/Plan: 1. Rectal cancer, invasive adenocarcinoma confirmed at colonoscopy 05/22/2015, EUS staging 06/01/2015 revealed a uT3,N1 lesion at 7 cm from the anal verge  Staging CT abdomen/pelvis 05/15/2015 confirmed a mass at the rectosigmoid colon, a suspicious perirectal lymph node, and a 6 mm right liver lesion felt to most likely be a cyst  CT chest 05/30/2015 with indeterminate pulmonary nodules and multiple liver lesions suspicious for metastases  Ultrasound 06/13/2014 with no liver lesions identified  MRI abdomen 06/21/2015 consistent with multiple liver metastases  CT biopsy of liver lesion 06/26/2015. Pathology  showed metastatic adenocarcinoma consistent with colonic primary.MSI-stable, low mutation burden, no RAS or BRAFmutation  Cycle 1 FOLFOX 07/10/2015  Cycle 2 FOLFOX plus Avastin 07/24/2015 with Neulasta support  Cycle 3 FOLFOX plus Avastin 08/07/2015  Cycle 4 FOLFOX plus Avastin 08/21/2015  cycle 5 FOLFOX plus Avastin 09/04/2015  Restaging CTs 09/15/2015 revealed a decrease in the size of liver lesions, decreased rectal primary, stable/decreased size of tiny lung nodules  Cycle 6 FOLFOX plus Avastin 09/18/2015  Cycle 7 FOLFOX plus Avastin 10/02/2015  Cycle 8 FOLFOX plus Avastin 10/16/2015  Cycle 9 FOLFOX plus Avastin 11/06/2015  Cycle 10 FOLFOX plus Avastin 11/20/2015  CTs 12/07/2015-mild decrease in wall thickening at the rectum, decrease in tiny hepatic metastases, stable lung nodules, no evidence of progressive metastatic disease, new left lower lobe infiltrate  Treatment changed to every three-week Avastin with every other week Xeloda beginning 12/11/2015  Restaging CTs 04/12/2016-interval increase in size of adjacent right upper lobe pulmonary nodules and right lower lobe pulmonary nodule; unchanged 5 mm lesion right hepatic lobe; no additional visualized hepatic metastasis. Grossly unchanged wall thickening of the rectum.  Continuation of Xeloda every other week and every three-week AvastinXeloda placed on hold 05/06/2016 due to hand-foot syndrome  Xeloda resumed 05/28/2016 1500 mg every morning and 1000 mg every afternoon 7 days on/7 days off  CT 09/27/2016-enlargement of lung nodules and new liver lesions, stable rectal mass  Cycle 1 FOLFIRI/panitumumab 10/08/2016  Cycle 2 panitumumab 10/23/2016 (FOLFIRI held secondary to neutropenia)  Cycle 3 FOLFIRI/PANITUMUMAB 11/04/2016  Cycle 4 FOLFIRI/panitumumab 11/18/2016  Cycle 5 FOLFIRI/panitumumab 12/16/2016  Cycle 6  FOLFIRI/panitumumab 12/31/2016  CTs 01/16/2017-decreased size of pulmonary nodules and liver  lesions. Decreased perirectal lymph node  Cycle 7 FOLFIRI/PANITUMUMAB 01/28/2017  Cycle 8FOLFIRI/PANITUMUMAB 02/10/2017  Cycle 9 FOLFIRI/PANITUMUMAB 02/24/2017  Cycle 10 FOLFIRI/panitumumab 03/10/2017 (irinotecan held secondary to neutropenia)  Cycle 11 FOLFIRI/panitumumab 03/24/2017  Cycle12 FOLFIRI/Panitumumab 04/07/2017  Restaging CTs 04/25/2017-stable rectal mass with adjacent 8 mm left perirectal nodal metastasis. Prior hepatic metastases not visualized on the current study. Indeterminate 16 mm enhancing lesion present in segment 6, indeterminate. Minimal residual nodularity in the right upper lobe. No new/progressive pulmonary metastases.  Maintenance Xeloda/panitumumab 04/28/2017 2.Multiple colon polyps on the colonoscopy 05/22/2015, tubular villous adenoma and a tubular adenoma were removed 3.Anemia-likely secondary to rectal bleeding.  4.Family history of multiple cancers including breast and prostate cancer 5.Transient right arm numbness 06/20/2015 6. Port-A-Cath placement 07/06/2015 by Dr. Marcello Moores 7. Mild neutropenia secondary to chemotherapy following cycle 1 FOLFOX. Neulasta added with cycle 2. 8. Delayed nausea following chemotherapy-emend added with cycle 3 FOLFOX;Emend added with cycle 12 FOLFIRI. 9. Early oxaliplatin neuropathy 10. 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 Xeloda resumed at a reduced dose. 11. Bilateral hand weakness. Question related to previous oxaliplatin. Referred to neurology 06/17/2016. 12. Hypertension-amlodipine started 07/08/2016 13. Rash secondary to PANITUMUMAB. He continues minocycline. 14. Neutropenia following cycle 1 FOLFIRI/PANITUMUMAB; neutropenia following cycle 4 FOLFIRI/PANITUMUMAB.     Disposition: [name] appears stable.  There is no clinical evidence of disease progression.  The plan is to continue Xeloda/Panitumumab.  Today's treatment will be held due to his insurance company  requiring CT scans.  We are referring him for restaging CTs.  He will return for a follow-up visit on 09/01/2017.  He will contact the office in the interim with any problems.    Ned Card ANP/GNP-BC   08/11/2017  10:00 AM

## 2017-08-12 ENCOUNTER — Telehealth: Payer: Self-pay

## 2017-08-12 DIAGNOSIS — C20 Malignant neoplasm of rectum: Secondary | ICD-10-CM

## 2017-08-12 MED ORDER — MAGNESIUM OXIDE 400 (241.3 MG) MG PO TABS: 400.0000 mg | ORAL_TABLET | Freq: Two times a day (BID) | ORAL | 0 refills | Status: DC

## 2017-08-12 NOTE — Telephone Encounter (Signed)
Informed patient of CT appt.   Patient to arrive at [facility] at 10:45am on 3/18. Pick up contrast from [facility] front desk. Drink first bottle at 9:00am and second bottle at 10:00am. Nothing to eat or drink after 7:00am.   Patient voiced understanding.

## 2017-08-12 NOTE — Telephone Encounter (Addendum)
Pt voiced understanding regarding message below   ----- Message from Owens Shark, NP sent at 08/12/2017  8:48 AM EDT ----- Please let him know magnesium level is low.  Begin magnesium oxide 400 mg twice daily.

## 2017-08-13 ENCOUNTER — Telehealth: Payer: Self-pay

## 2017-08-13 NOTE — Telephone Encounter (Signed)
Patient has mychart to receive updated appointment  On 4/22, and canceled appointment for 5/13 and 6/3

## 2017-08-14 MED FILL — CAPECITABINE 500 MG TABLET: 500 | 28 days supply | Qty: 98 | Fill #0

## 2017-08-18 ENCOUNTER — Encounter (HOSPITAL_COMMUNITY): Payer: Self-pay

## 2017-08-18 ENCOUNTER — Ambulatory Visit (HOSPITAL_COMMUNITY)
Admission: RE | Admit: 2017-08-18 | Discharge: 2017-08-18 | Disposition: A | Discharge status: Home / Self Care | Payer: Medicare HMO | Source: Ambulatory Visit | Attending: Nurse Practitioner | Admitting: Nurse Practitioner

## 2017-08-18 DIAGNOSIS — R918 Other nonspecific abnormal finding of lung field: Secondary | ICD-10-CM | POA: Insufficient documentation

## 2017-08-18 DIAGNOSIS — C20 Malignant neoplasm of rectum: Secondary | ICD-10-CM | POA: Insufficient documentation

## 2017-08-18 DIAGNOSIS — N400 Enlarged prostate without lower urinary tract symptoms: Secondary | ICD-10-CM | POA: Diagnosis not present

## 2017-08-18 DIAGNOSIS — C218 Malignant neoplasm of overlapping sites of rectum, anus and anal canal: Secondary | ICD-10-CM | POA: Diagnosis not present

## 2017-08-18 DIAGNOSIS — K769 Liver disease, unspecified: Secondary | ICD-10-CM | POA: Diagnosis not present

## 2017-08-18 MED ORDER — IOPAMIDOL (ISOVUE-300) INJECTION 61%
100.0000 mL | Freq: Once | INTRAVENOUS | Status: AC | PRN
  Administered 2017-08-18: 100 mL via INTRAVENOUS

## 2017-08-18 MED ORDER — IOPAMIDOL (ISOVUE-300) INJECTION 61%
INTRAVENOUS | Status: AC
  Filled 2017-08-18: qty 100

## 2017-08-25 ENCOUNTER — Telehealth: Payer: Self-pay

## 2017-08-25 NOTE — Telephone Encounter (Signed)
Call from pt reporting pain in rectum. "it feels like it did when I first got diagnosed". Pt reports taking miralax and ducolax "with no relief". Also reports "it looks like red veins when I have a BM". Confirms constipation. States that releasing gas relieves pain. MD made aware. Per Dr. Benay Spice, pt to take colace BID along with miralax and pt to come in tomorrow am for visit. Pt voiced understanding .

## 2017-08-26 ENCOUNTER — Inpatient Hospital Stay (HOSPITAL_BASED_OUTPATIENT_CLINIC_OR_DEPARTMENT_OTHER): Payer: Medicare HMO | Admitting: Nurse Practitioner

## 2017-08-26 ENCOUNTER — Encounter: Payer: Self-pay | Admitting: Nurse Practitioner

## 2017-08-26 VITALS — BP 132/79 | HR 69 | Temp 98.2°F | Resp 17 | Ht 72.0 in | Wt 190.0 lb

## 2017-08-26 DIAGNOSIS — R21 Rash and other nonspecific skin eruption: Secondary | ICD-10-CM | POA: Diagnosis not present

## 2017-08-26 DIAGNOSIS — C787 Secondary malignant neoplasm of liver and intrahepatic bile duct: Secondary | ICD-10-CM | POA: Diagnosis not present

## 2017-08-26 DIAGNOSIS — K6289 Other specified diseases of anus and rectum: Secondary | ICD-10-CM

## 2017-08-26 DIAGNOSIS — C7800 Secondary malignant neoplasm of unspecified lung: Secondary | ICD-10-CM

## 2017-08-26 DIAGNOSIS — K5900 Constipation, unspecified: Secondary | ICD-10-CM | POA: Diagnosis not present

## 2017-08-26 DIAGNOSIS — C20 Malignant neoplasm of rectum: Secondary | ICD-10-CM

## 2017-08-26 DIAGNOSIS — L853 Xerosis cutis: Secondary | ICD-10-CM | POA: Diagnosis not present

## 2017-08-26 DIAGNOSIS — D701 Agranulocytosis secondary to cancer chemotherapy: Secondary | ICD-10-CM | POA: Diagnosis not present

## 2017-08-26 DIAGNOSIS — Z8042 Family history of malignant neoplasm of prostate: Secondary | ICD-10-CM | POA: Diagnosis not present

## 2017-08-26 MED ORDER — HYDROCODONE-ACETAMINOPHEN 5-325 MG PO TABS: 1.0000 | ORAL_TABLET | Freq: Four times a day (QID) | ORAL | 0 refills | Status: DC | PRN

## 2017-08-26 MED ORDER — HYDROCODONE-ACETAMINOPHEN 5-325 MG PO TABS
ORAL_TABLET | ORAL | Status: AC
  Filled 2017-08-26: qty 1

## 2017-08-26 MED ORDER — HYDROCODONE-ACETAMINOPHEN 5-325 MG PO TABS
1.0000 | ORAL_TABLET | Freq: Once | ORAL | Status: AC
  Administered 2017-08-26: 1 via ORAL

## 2017-08-26 NOTE — Progress Notes (Addendum)
GI Location of Tumor / Histology: Rectal Cancer   TEVON BERHANE presented  months ago with symptoms of: epigastric pain with bloody diarrhea ,nausea ,vomiting   Increased pain at the rectum.  Progression of rectal cancer.  Staging CT shows evidence of mild progression in the lungs.  Biopsies of  (if applicable) revealed:  4/31/54 Liver   05/22/15 : 1. Colon, polyp(s), cecum piece meal, polyp - MULTIPLE FRAGMENTS OF TUBULOVILLOUS ADENOMA.- NO HIGH GRADE DYSPLASIA OR MALIGNANCY. 1 of 3 FINAL for DRAVEN, NATTER ([id number]) Diagnosis(continued) 2. Colon, polyp(s), descending, polyp- TUBULAR ADENOMA.- NO HIGH GRADE DYSPLASIA OR MALIGNANCY. 3. Rectum, biopsy, rectal mass- INVASIVE ADENOCARCINOMA.  CT ABD/Pelvis 08/18/2017: New and enlarging bilateral pulmonary nodules concerning for progressive metastatic disease.  Apparent progression of wall thickening in the rectum with enlarging perirectal lymph nodes.  Past/Anticipated interventions by surgeon, if any:  EUS 06/01/15 Dr. Owens Loffler P,MD  Past/Anticipated interventions by medical oncology, if any: Ned Card NP 08/26/2017:  Plan to discontinue Xeloda/Panitumumab.  We have made a referral to radiation oncology.  He will be seen in radiation tomorrow with an anticipated start date of 09/01/2017.  We will plan to give concurrent Xeloda on the days of radiation.  We will see him in follow-up the week of 09/08/2017.  He agrees with the above plan.  He is familiar with the potential toxicities associated with Xeloda.  Weight changes, if any:  no.  1 pound weight gain since last night.  Bowel/Bladder complaints, if any: rectal bleeding with bowel movements.  Notices some spotting when having bowel movement.  Nausea / Vomiting, if any: this morning,  After waking up just nauseated,lasted 2 hours, nothing taken to relieve, has resolved. None  Pain issues, if DTO:IZTI bowel movements 4/5 on 10 scale.  Feels constipated even  after having a bowel movement.  Feels like he has to go right back to the bathroom.  Any blood per rectum: bleeding yes, with bowel movements.  Has blood spotting with bowel movements.  SAFETY ISSUES: Fatigued no  Prior radiation? NO  Pacemaker/ICD? NO  Is the patient on methotrexate? NO  Current details/Co:Married, 2 children,  former smoker 1ppd x 10 years,quit 06/03/78, occasional  alcohol use,  no drug use Mother DM,Kidney disease,Father prostate Ca,DM, Sister breast cancer,  Increased pain at the rectum, frequent bowel movements, constipated.  Will restart xeloda when radiation is started.  Allergies:NKA

## 2017-08-26 NOTE — Progress Notes (Addendum)
[facility] OFFICE PROGRESS NOTE   Diagnosis: Rectal cancer  INTERVAL HISTORY:   Hunter Walker returns as scheduled.  He continues Xeloda.  Panitumumab was held 08/11/2017.  He denies nausea/vomiting.  No mouth sores.  No diarrhea.  No hand or foot pain or redness.  He notes increased pain at the rectum.  He is having frequent bowel movements.  He feels constipated.  He is having some bleeding with bowel movements.  He continues MiraLAX and recently began a stool softener.  Skin remains dry.  Objective:  Vital signs in last 24 hours:  Blood pressure 132/79, pulse 69, temperature 98.2 F (36.8 C), temperature source Oral, resp. rate 17, height 6' (1.829 m), weight 190 lb (86.2 kg), SpO2 100 %.    HEENT: No thrush or ulcers. Lymphatics: No palpable cervical or supraclavicular lymph nodes. Resp: Lungs clear bilaterally. Cardio: Regular rate and rhythm. GI: Abdomen soft and nontender.  No hepatomegaly. Vascular: No leg edema. Neuro: Alert and oriented. Skin: Skin over the trunk with a marked dry appearance. Port-A-Cath without erythema.   Lab Results:  Lab Results  Component Value Date   WBC 4.6 08/11/2017   HGB 14.0 06/09/2017   HCT 44.8 08/11/2017   MCV 108.8 (H) 08/11/2017   PLT 168 08/11/2017   NEUTROABS 2.3 08/11/2017    Imaging:  No results found.  Medications: I have reviewed the patient's current medications.  Assessment/Plan: 1. Rectal cancer, invasive adenocarcinoma confirmed at colonoscopy 05/22/2015, EUS staging 06/01/2015 revealed a uT3,N1 lesion at 7 cm from the anal verge  Staging CT abdomen/pelvis 05/15/2015 confirmed a mass at the rectosigmoid colon, a suspicious perirectal lymph node, and a 6 mm right liver lesion felt to most likely be a cyst  CT chest 05/30/2015 with indeterminate pulmonary nodules and multiple liver lesions suspicious for metastases  Ultrasound 06/13/2014 with no liver lesions identified  MRI abdomen 06/21/2015  consistent with multiple liver metastases  CT biopsy of liver lesion 06/26/2015. Pathology showed metastatic adenocarcinoma consistent with colonic primary.MSI-stable, low mutation burden, no RAS or BRAFmutation  Cycle 1 FOLFOX 07/10/2015  Cycle 2 FOLFOX plus Avastin 07/24/2015 with Neulasta support  Cycle 3 FOLFOX plus Avastin 08/07/2015  Cycle 4 FOLFOX plus Avastin 08/21/2015  cycle 5 FOLFOX plus Avastin 09/04/2015  Restaging CTs 09/15/2015 revealed a decrease in the size of liver lesions, decreased rectal primary, stable/decreased size of tiny lung nodules  Cycle 6 FOLFOX plus Avastin 09/18/2015  Cycle 7 FOLFOX plus Avastin 10/02/2015  Cycle 8 FOLFOX plus Avastin 10/16/2015  Cycle 9 FOLFOX plus Avastin 11/06/2015  Cycle 10 FOLFOX plus Avastin 11/20/2015  CTs 12/07/2015-mild decrease in wall thickening at the rectum, decrease in tiny hepatic metastases, stable lung nodules, no evidence of progressive metastatic disease, new left lower lobe infiltrate  Treatment changed to every three-week Avastin with every other week Xeloda beginning 12/11/2015  Restaging CTs 04/12/2016-interval increase in size of adjacent right upper lobe pulmonary nodules and right lower lobe pulmonary nodule; unchanged 5 mm lesion right hepatic lobe; no additional visualized hepatic metastasis. Grossly unchanged wall thickening of the rectum.  Continuation of Xeloda every other week and every three-week AvastinXeloda placed on hold 05/06/2016 due to hand-foot syndrome  Xeloda resumed 05/28/2016 1500 mg every morning and 1000 mg every afternoon 7 days on/7 days off  CT 09/27/2016-enlargement of lung nodules and new liver lesions, stable rectal mass  Cycle 1 FOLFIRI/panitumumab 10/08/2016  Cycle 2 panitumumab 10/23/2016 (FOLFIRI held secondary to neutropenia)  Cycle 3 FOLFIRI/PANITUMUMAB 11/04/2016  Cycle 4 FOLFIRI/panitumumab 11/18/2016  Cycle 5 FOLFIRI/panitumumab 12/16/2016  Cycle 6  FOLFIRI/panitumumab 12/31/2016  CTs 01/16/2017-decreased size of pulmonary nodules and liver lesions. Decreased perirectal lymph node  Cycle 7 FOLFIRI/PANITUMUMAB 01/28/2017  Cycle 8FOLFIRI/PANITUMUMAB 02/10/2017  Cycle 9 FOLFIRI/PANITUMUMAB 02/24/2017  Cycle 10 FOLFIRI/panitumumab 03/10/2017 (irinotecan held secondary to neutropenia)  Cycle 11 FOLFIRI/panitumumab 03/24/2017  Cycle12 FOLFIRI/Panitumumab 04/07/2017  Restaging CTs 04/25/2017-stable rectal mass with adjacent 8 mm left perirectal nodal metastasis. Prior hepatic metastases not visualized on the current study. Indeterminate 16 mm enhancing lesion present in segment 6, indeterminate. Minimal residual nodularity in the right upper lobe. No new/progressive pulmonary metastases.  Maintenance Xeloda/panitumumab 04/28/2017  Restaging CTs 08/18/2017-new and enlarging bilateral lung nodules; 2 hyperattenuating lesions in the liver appear similar to prior study; progression of wall thickening in the rectum with enlarging perirectal lymph nodes. 2.Multiple colon polyps on the colonoscopy 05/22/2015, tubular villous adenoma and a tubular adenoma were removed 3.Anemia-likely secondary to rectal bleeding.  4.Family history of multiple cancers including breast and prostate cancer 5.Transient right arm numbness 06/20/2015 6. Port-A-Cath placement 07/06/2015 by Dr. Marcello Moores 7. Mild neutropenia secondary to chemotherapy following cycle 1 FOLFOX. Neulasta added with cycle 2. 8. Delayed nausea following chemotherapy-emend added with cycle 3 FOLFOX;Emend added with cycle 12 FOLFIRI. 9. Early oxaliplatin neuropathy 10. 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 Xeloda resumed at a reduced dose. 11. Bilateral hand weakness. Question related to previous oxaliplatin. Referred to neurology 06/17/2016. 12. Hypertension-amlodipine started 07/08/2016 13. Rash secondary to PANITUMUMAB. He continues minocycline. 14.  Neutropenia following cycle 1 FOLFIRI/PANITUMUMAB; neutropenia following cycle 4 FOLFIRI/PANITUMUMAB.     Disposition: Hunter Walker has increased pain at the rectum.  He understands this is due to progression of the cancer.  He also understands the restaging CTs show evidence of mild progression in the lungs.  Plan to discontinue Xeloda/Panitumumab.  We have made a referral to radiation oncology.  He will be seen in radiation tomorrow with an anticipated start date of 09/01/2017.  We will plan to give concurrent Xeloda on the days of radiation.  We will see him in follow-up the week of 09/08/2017.  He agrees with the above plan.  He is familiar with the potential toxicities associated with Xeloda.  He will continue MiraLAX and a stool softener.  For pain he was provided with a prescription for hydrocodone 5/325 1-2 tablets every 6 hours as needed.  He understands he should not drive while taking narcotics.  Patient seen with Dr. Benay Spice.  CT images reviewed on the computer with Mr. Rosencrans and his wife.  25 minutes were spent face-to-face at today's visit with the majority of that time involved in counseling/coordination of care.    Ned Card ANP/GNP-BC   08/26/2017  10:27 AM This was a shared visit with Ned Card.  We reviewed the CT images with Mr. Fitton.  There is clinical and x-ray evidence of disease progression.  He is symptomatic with rectal pain and urgency.  He will be referred for palliative radiation to the rectum with concurrent capecitabine.  We will consider resuming FOLFIRI/panitumumab or referring him for a clinical trial at the completion of radiation.  He was prescribed hydrocodone for pain.  Julieanne Manson, MD

## 2017-08-27 ENCOUNTER — Telehealth: Payer: Self-pay | Admitting: Pharmacist

## 2017-08-27 ENCOUNTER — Encounter: Payer: Self-pay | Admitting: Radiation Oncology

## 2017-08-27 ENCOUNTER — Ambulatory Visit
Admission: RE | Admit: 2017-08-27 | Discharge: 2017-08-27 | Disposition: A | Discharge status: Home / Self Care | Payer: Medicare HMO | Source: Ambulatory Visit | Attending: Radiation Oncology | Admitting: Radiation Oncology

## 2017-08-27 ENCOUNTER — Other Ambulatory Visit: Payer: Self-pay

## 2017-08-27 VITALS — BP 132/81 | HR 76 | Temp 98.1°F | Resp 20 | Ht 72.0 in | Wt 191.0 lb

## 2017-08-27 DIAGNOSIS — Z8042 Family history of malignant neoplasm of prostate: Secondary | ICD-10-CM | POA: Insufficient documentation

## 2017-08-27 DIAGNOSIS — Z79899 Other long term (current) drug therapy: Secondary | ICD-10-CM | POA: Insufficient documentation

## 2017-08-27 DIAGNOSIS — Z833 Family history of diabetes mellitus: Secondary | ICD-10-CM | POA: Insufficient documentation

## 2017-08-27 DIAGNOSIS — C20 Malignant neoplasm of rectum: Secondary | ICD-10-CM

## 2017-08-27 DIAGNOSIS — Z803 Family history of malignant neoplasm of breast: Secondary | ICD-10-CM | POA: Diagnosis not present

## 2017-08-27 DIAGNOSIS — K6289 Other specified diseases of anus and rectum: Secondary | ICD-10-CM | POA: Diagnosis not present

## 2017-08-27 DIAGNOSIS — R918 Other nonspecific abnormal finding of lung field: Secondary | ICD-10-CM | POA: Diagnosis not present

## 2017-08-27 DIAGNOSIS — Z9889 Other specified postprocedural states: Secondary | ICD-10-CM | POA: Diagnosis not present

## 2017-08-27 DIAGNOSIS — Z87891 Personal history of nicotine dependence: Secondary | ICD-10-CM | POA: Insufficient documentation

## 2017-08-27 MED ORDER — CAPECITABINE 500 MG PO TABS: 1500.0000 mg | ORAL_TABLET | Freq: Two times a day (BID) | ORAL | 0 refills | Status: DC

## 2017-08-27 NOTE — Progress Notes (Signed)
  Radiation Oncology         [phone] ________________________________  Name: Hunter Walker MRN: [id number]  Date: 08/27/2017  DOB: [birth year]  SIMULATION AND TREATMENT PLANNING NOTE  DIAGNOSIS:     ICD-10-CM   1. Rectal cancer ([place]) - adenocarcinoma C20      Site:  pelvis  NARRATIVE:  The patient was brought to the [facility].  Identity was confirmed.  All relevant records and images related to the planned course of therapy were reviewed.   Written consent to proceed with treatment was confirmed which was freely given after reviewing the details related to the planned course of therapy had been reviewed with the patient.  Then, the patient was set-up in a stable reproducible  supine position for radiation therapy.  CT images were obtained.  Surface markings were placed.    Medically necessary complex treatment device(s) for immobilization:  Vac-lock bag.   The CT images were loaded into the planning software.  Then the target and avoidance structures were contoured.  Treatment planning then occurred.  The radiation prescription was entered and confirmed.  A total of 4 complex treatment devices were fabricated which relate to the designed radiation treatment fields. Each of these customized fields/ complex treatment devices will be used on a daily basis during the radiation course. I have requested : 3D Simulation  I have requested a DVH of the following structures: target volume/ GTV, bladder, rectum, femoral heads.   PLAN:  The patient will receive 40 Gy in 16 fractions.  Special treatment procedure The patient will also receive concurrent chemotherapy during the treatment. The patient may therefore experience increased toxicity or side effects and the patient will be monitored for such problems. This may require extra lab work as necessary. This therefore constitutes a special treatment procedure.   ________________________________   Jodelle Gross, MD,  PhD

## 2017-08-27 NOTE — Progress Notes (Signed)
  Radiation Oncology         [phone] ________________________________  Name: Hunter Walker MRN: [id number]  Date: 08/27/2017  DOB: April 16, 1951  Optical Surface Tracking Plan:  Since intensity modulated radiotherapy (IMRT) and 3D conformal radiation treatment methods are predicated on accurate and precise positioning for treatment, intrafraction motion monitoring is medically necessary to ensure accurate and safe treatment delivery.  The ability to quantify intrafraction motion without excessive ionizing radiation dose can only be performed with optical surface tracking. Accordingly, surface imaging offers the opportunity to obtain 3D measurements of patient position throughout IMRT and 3D treatments without excessive radiation exposure.  I am ordering optical surface tracking for this patient's upcoming course of radiotherapy. ________________________________  Kyung Rudd, MD 08/27/2017 5:29 PM    Reference:   Particia Jasper, et al. Surface imaging-based analysis of intrafraction motion for breast radiotherapy patients.Journal of [place], n. 6, nov. 2014. ISSN [id number].   Available at: <http://www.jacmp.org/index.php/jacmp/article/view/4957>.

## 2017-08-27 NOTE — Progress Notes (Signed)
Radiation Oncology         [phone] ________________________________  Name: Hunter Walker        MRN: [id number]  Date of Service: 08/27/2017 DOB: August 02, 1950  XL:[id number], Real Cons, MD  Ladell Pier, MD     REFERRING PHYSICIAN: Ladell Pier, MD   DIAGNOSIS: The encounter diagnosis was Rectal cancer [facility]).   HISTORY OF PRESENT ILLNESS: Hunter Walker is a 67 y.o. male seen at the request of Dr. Benay Walker for a history of Stage IV adenocarcinoma of the rectum who presented with disease in the liver and lungs, who has been on systemic therapy since his diagnosis in early January 2017. He initially had a very robust response to therapy and completed FOLFOX plus Avastin in June 2017. He continued with Avastin/Xeloda and was found in November 2017 to have to have some progressive RUL dissease. He begain FOLFIRI/panitumumab after progression in the spring of 2018 and continued on maintenance Xeloda and panitumumab until he recently had a CT on 08/18/17 that revealed progressive disease in the rectum and somewhat in the lungs. He comes today to discuss options of palliative radiotherapy as he's noted increasing rectal symptoms in the last 2 weeks.     PREVIOUS RADIATION THERAPY: No   PAST MEDICAL HISTORY:  Past Medical History:  Diagnosis Date  . Allergic rhinitis   . Cancer [facility])    rectal cancer  . GERD (gastroesophageal reflux disease)   . Hemorrhoids        PAST SURGICAL HISTORY: Past Surgical History:  Procedure Laterality Date  . EUS N/A 06/01/2015   Procedure: LOWER ENDOSCOPIC ULTRASOUND (EUS);  Surgeon: Milus Banister, MD;  Location: [facility];  Service: Endoscopy;  Laterality: N/A;  . LIVER BIOPSY  06/26/2015  . NO PAST SURGERIES    . PORTACATH PLACEMENT Right 07/06/2015   Procedure: INSERTION PORT-A-CATH;  Surgeon: Leighton Ruff, MD;  Location: [facility];  Service: General;  Laterality: Right;     FAMILY HISTORY:  Family History  Problem Relation  Age of Onset  . Breast cancer Sister        x 2  . Diabetes Mother   . Kidney disease Mother   . Prostate cancer Father   . Diabetes Father   . Prostate cancer Brother   . Diabetes Sister   . Heart disease Brother   . Colon cancer Neg Hx      SOCIAL HISTORY:  reports that he quit smoking about 39 years ago. His smoking use included cigarettes. He has a 10.00 pack-year smoking history. He has never used smokeless tobacco. He reports that he drinks alcohol. He reports that he does not use drugs. The patient is married and lives in [place]. He's accompanied by his wife and is retired.   ALLERGIES: Patient has no known allergies.   MEDICATIONS:  Current Outpatient Medications  Medication Sig Dispense Refill  . amLODipine (NORVASC) 5 MG tablet Take 1 tablet (5 mg total) by mouth daily. 30 tablet 2  . bisacodyl ([employer] GENTLE LAXATIVE) 5 MG EC tablet Take 1 tablet (5 mg total) by mouth daily as needed (constipation). 30 tablet 5  . ferrous sulfate 325 (65 FE) MG tablet Take 325 mg by mouth daily with breakfast.     . gabapentin (NEURONTIN) 300 MG capsule TAKE 1-2 CAPSULES (300-600 MG TOTAL) BY MOUTH AT BEDTIME. 60 capsule 3  . HYDROcodone-acetaminophen (NORCO) 5-325 MG tablet Take 1-2 tablets by mouth every 6 (six) hours as needed for  moderate pain. 50 tablet 0  . magnesium oxide (MAG-OX) 400 (241.3 Mg) MG tablet Take 1 tablet (400 mg total) by mouth 2 (two) times daily. 60 tablet 0  . minocycline (MINOCIN) 100 MG capsule Take 1 capsule (100 mg total) by mouth 2 (two) times daily. 60 capsule 2  . pantoprazole (PROTONIX) 40 MG tablet Take 1 tablet (40 mg total) by mouth daily. 30 tablet 3  . polyethylene glycol (MIRALAX / GLYCOLAX) packet Take 17 g by mouth daily.    . prochlorperazine (COMPAZINE) 10 MG tablet TAKE 1 TABLET (10 MG TOTAL) BY MOUTH EVERY 6 (SIX) HOURS AS NEEDED FOR NAUSEA OR VOMITING. 30 tablet 2  . capecitabine (XELODA) 500 MG tablet Take 3 tablets (1,500 mg total) by mouth  2 (two) times daily after a meal. Take on days of radiation only, M-F 10 tablet 0  . NON FORMULARY     . NON FORMULARY     . Polyethylene Glycol 3350 GRAN      No current facility-administered medications for this encounter.    Facility-Administered Medications Ordered in Other Encounters  Medication Dose Route Frequency Provider Last Rate Last Dose  . sodium chloride 0.9 % injection 10 mL  10 mL Intravenous PRN Ladell Pier, MD   10 mL at 12/31/16 0811  . sodium chloride 0.9 % injection 10 mL  10 mL Intravenous PRN Ladell Pier, MD   10 mL at 03/24/17 0848     REVIEW OF SYSTEMS: On review of systems, the patient reports that he is doing okay. In the last 2 weeks, he's noted increasing rectal fullness, hematochezia, and fecal urgency. He denies any chest pain, shortness of breath, cough, fevers, chills, night sweats, unintended weight changes. He denies any diarrhea or constipation, or bladder disturbances, and denies abdominal pain, nausea or vomiting. He denies any new musculoskeletal or joint aches or pains. A complete review of systems is obtained and is otherwise negative.     PHYSICAL EXAM:  Wt Readings from Last 3 Encounters:  08/27/17 191 lb (86.6 kg)  08/26/17 190 lb (86.2 kg)  08/11/17 190 lb 6.4 oz (86.4 kg)   Temp Readings from Last 3 Encounters:  08/27/17 98.1 F (36.7 C) (Oral)  08/26/17 98.2 F (36.8 C) (Oral)  08/11/17 97.9 F (36.6 C) (Oral)   BP Readings from Last 3 Encounters:  08/27/17 132/81  08/26/17 132/79  08/11/17 119/83   Pulse Readings from Last 3 Encounters:  08/27/17 76  08/26/17 69  08/11/17 74   Pain Assessment Pain Score: 5  Pain Loc: Rectum/10  In general this is a well appearing African American male in no acute distress. He is alert and oriented x4 and appropriate throughout the examination. HEENT reveals that the patient is normocephalic, atraumatic. EOMs are intact. PERRLA. Skin is intact without any evidence of gross lesions.  Cardiopulmonary assessment is negative for acute distress and he exhibits normal effort. Rectal examination is deferred.    ECOG = 1  0 - Asymptomatic (Fully active, able to carry on all predisease activities without restriction)  1 - Symptomatic but completely ambulatory (Restricted in physically strenuous activity but ambulatory and able to carry out work of a light or sedentary nature. For example, light housework, office work)  2 - Symptomatic, <50% in bed during the day (Ambulatory and capable of all self care but unable to carry out any work activities. Up and about more than 50% of waking hours)  3 - Symptomatic, >50% in bed,  but not bedbound (Capable of only limited self-care, confined to bed or chair 50% or more of waking hours)  4 - Bedbound (Completely disabled. Cannot carry on any self-care. Totally confined to bed or chair)  5 - Death   Eustace Pen MM, Creech RH, Tormey DC, et al. [phone]). "Toxicity and response criteria of the [facility]". [place] Oncol. 5 (6): 649-55    LABORATORY DATA:  Lab Results  Component Value Date   WBC 4.6 08/11/2017   HGB 14.0 06/09/2017   HCT 44.8 08/11/2017   MCV 108.8 (H) 08/11/2017   PLT 168 08/11/2017   Lab Results  Component Value Date   NA 141 08/11/2017   K 3.9 08/11/2017   CL 107 08/11/2017   CO2 26 08/11/2017   Lab Results  Component Value Date   ALT 21 08/11/2017   AST 23 08/11/2017   ALKPHOS 115 08/11/2017   BILITOT 0.6 08/11/2017      RADIOGRAPHY: Ct Chest W Contrast  Result Date: 08/18/2017 CLINICAL DATA:  Rectal cancer. EXAM: CT CHEST, ABDOMEN, AND PELVIS WITH CONTRAST TECHNIQUE: Multidetector CT imaging of the chest, abdomen and pelvis was performed following the standard protocol during bolus administration of intravenous contrast. CONTRAST:  131mL ISOVUE-300 IOPAMIDOL (ISOVUE-300) INJECTION 61% COMPARISON:  04/25/2017 FINDINGS: CT CHEST FINDINGS Cardiovascular: The heart size is normal. No  pericardial effusion. Atherosclerotic calcification is noted in the wall of the thoracic aorta. Right Port-A-Cath tip is positioned in the distal SVC near the junction with the RA. Mediastinum/Nodes: No mediastinal lymphadenopathy. There is no hilar lymphadenopathy. The esophagus has normal imaging features. There is no axillary lymphadenopathy. Lungs/Pleura: A thin linear opacity seen anteriorly in the right upper lobe on the prior study has become multinodular/lobular in configuration since that time. This lesion measures 7 x 11 mm today (image 43/series 6). Another tiny nodular density in the posterior right upper lobe has become bilobed in the interval and has increased in size (image 39/6) measuring 3 x 5 mm today. 5 mm right lower lobe nodule (image 86/6) has increased from 2 mm previously. 8 mm nodule posterior left lung base (image 133/6) is new since prior study. Musculoskeletal: Bone windows reveal no worrisome lytic or sclerotic osseous lesions. CT ABDOMEN PELVIS FINDINGS Hepatobiliary: Geographic fatty deposition noted in the liver parenchyma. 1.6 cm focal hyperattenuating lesion identified in segment VI on the prior study is similar in the interval. No other definite focal liver lesion evident although there may be a 7 mm hyperattenuating lesion adjacent to the gallbladder on image 63/2. There is no evidence for gallstones, gallbladder wall thickening, or pericholecystic fluid. No intrahepatic or extrahepatic biliary dilation. Pancreas: Mild distention of the main pancreatic duct in the head of the pancreas is stable. Pancreas otherwise unremarkable. Spleen: No splenomegaly. No focal mass lesion. Adrenals/Urinary Tract: No adrenal nodule or mass. Kidneys unremarkable. No evidence for hydroureter. The urinary bladder appears normal for the degree of distention. Stomach/Bowel: Stomach is nondistended. No gastric wall thickening. No evidence of outlet obstruction. Duodenum is normally positioned as is the  ligament of Treitz. No small bowel wall thickening. No small bowel dilatation. The terminal ileum is normal. The appendix is normal. Right, transverse, left colon are unremarkable. Lesion in the distal sigmoid colon is again noted with circumferential wall thickening. Rectal wall in this region appears thicker than on the prior study measuring up to 14 mm diameter. Vascular/Lymphatic: Left perirectal lymph node measured previously at 8 mm short axis now measures 11  mm. 11 mm short axis lymph node to the right of the distal sigmoid colon (image 112/2) has increased from 6 mm previously. Additional small perirectal lymph nodes are evident. There is abdominal aortic atherosclerosis without aneurysm. There is no gastrohepatic or hepatoduodenal ligament lymphadenopathy. No intraperitoneal or retroperitoneal lymphadenopathy. Reproductive: Prostate gland is enlarged. Other: No intraperitoneal free fluid. Musculoskeletal: Bone windows reveal no worrisome lytic or sclerotic osseous lesions. IMPRESSION: 1. New and enlarging bilateral pulmonary nodules concerning for progressive metastatic disease. 2. Similar appearance of geographic fatty deposition within the liver parenchyma. 2 hyperattenuating lesions in the liver appears similar to prior study metastatic disease not excluded. 3. Apparent progression of wall thickening in the rectum with enlarging perirectal lymph nodes. 4. Prostatomegaly 5.  Aortic Atherosclerois (ICD10-170.0) Electronically Signed   By: Misty Stanley M.D.   On: 08/18/2017 14:50   Ct Abdomen Pelvis W Contrast  Result Date: 08/18/2017 CLINICAL DATA:  Rectal cancer. EXAM: CT CHEST, ABDOMEN, AND PELVIS WITH CONTRAST TECHNIQUE: Multidetector CT imaging of the chest, abdomen and pelvis was performed following the standard protocol during bolus administration of intravenous contrast. CONTRAST:  120mL ISOVUE-300 IOPAMIDOL (ISOVUE-300) INJECTION 61% COMPARISON:  04/25/2017 FINDINGS: CT CHEST FINDINGS  Cardiovascular: The heart size is normal. No pericardial effusion. Atherosclerotic calcification is noted in the wall of the thoracic aorta. Right Port-A-Cath tip is positioned in the distal SVC near the junction with the RA. Mediastinum/Nodes: No mediastinal lymphadenopathy. There is no hilar lymphadenopathy. The esophagus has normal imaging features. There is no axillary lymphadenopathy. Lungs/Pleura: A thin linear opacity seen anteriorly in the right upper lobe on the prior study has become multinodular/lobular in configuration since that time. This lesion measures 7 x 11 mm today (image 43/series 6). Another tiny nodular density in the posterior right upper lobe has become bilobed in the interval and has increased in size (image 39/6) measuring 3 x 5 mm today. 5 mm right lower lobe nodule (image 86/6) has increased from 2 mm previously. 8 mm nodule posterior left lung base (image 133/6) is new since prior study. Musculoskeletal: Bone windows reveal no worrisome lytic or sclerotic osseous lesions. CT ABDOMEN PELVIS FINDINGS Hepatobiliary: Geographic fatty deposition noted in the liver parenchyma. 1.6 cm focal hyperattenuating lesion identified in segment VI on the prior study is similar in the interval. No other definite focal liver lesion evident although there may be a 7 mm hyperattenuating lesion adjacent to the gallbladder on image 63/2. There is no evidence for gallstones, gallbladder wall thickening, or pericholecystic fluid. No intrahepatic or extrahepatic biliary dilation. Pancreas: Mild distention of the main pancreatic duct in the head of the pancreas is stable. Pancreas otherwise unremarkable. Spleen: No splenomegaly. No focal mass lesion. Adrenals/Urinary Tract: No adrenal nodule or mass. Kidneys unremarkable. No evidence for hydroureter. The urinary bladder appears normal for the degree of distention. Stomach/Bowel: Stomach is nondistended. No gastric wall thickening. No evidence of outlet  obstruction. Duodenum is normally positioned as is the ligament of Treitz. No small bowel wall thickening. No small bowel dilatation. The terminal ileum is normal. The appendix is normal. Right, transverse, left colon are unremarkable. Lesion in the distal sigmoid colon is again noted with circumferential wall thickening. Rectal wall in this region appears thicker than on the prior study measuring up to 14 mm diameter. Vascular/Lymphatic: Left perirectal lymph node measured previously at 8 mm short axis now measures 11 mm. 11 mm short axis lymph node to the right of the distal sigmoid colon (image 112/2) has increased from  6 mm previously. Additional small perirectal lymph nodes are evident. There is abdominal aortic atherosclerosis without aneurysm. There is no gastrohepatic or hepatoduodenal ligament lymphadenopathy. No intraperitoneal or retroperitoneal lymphadenopathy. Reproductive: Prostate gland is enlarged. Other: No intraperitoneal free fluid. Musculoskeletal: Bone windows reveal no worrisome lytic or sclerotic osseous lesions. IMPRESSION: 1. New and enlarging bilateral pulmonary nodules concerning for progressive metastatic disease. 2. Similar appearance of geographic fatty deposition within the liver parenchyma. 2 hyperattenuating lesions in the liver appears similar to prior study metastatic disease not excluded. 3. Apparent progression of wall thickening in the rectum with enlarging perirectal lymph nodes. 4. Prostatomegaly 5.  Aortic Atherosclerois (ICD10-170.0) Electronically Signed   By: Misty Stanley M.D.   On: 08/18/2017 14:50       IMPRESSION/PLAN: 1. Progressive Stage IV adenocarcinoma of the rectum with bleeding and rectal pain. Dr. Lisbeth Renshaw discusses his case and reviews his prior therapy, diagnosis, and current imaging. He discusses the concerns are that the tumor is locally starting to progress and cause symptoms of pain and bleeding. He offers the patient a palliative course of  raditoherapy. As Dr. Benay Walker is planning concurrent Xeloda, Dr. Lisbeth Renshaw recommends a course of 3 weeks. We discussed the risks, benefits, short, and long term effects of radiotherapy, and the patient is interested in proceeding. Dr. Lisbeth Renshaw discusses the delivery and logistics of radiotherapy and he will return at 2 pm this afternoon for simulation. Written consent is obtained and placed in the chart, a copy was provided to the patient. We will plan to begin treatment on 09/01/17. 2. Pulmonary nodules. The patient has pulmonary nodules on his recent CT imaging and these will be followed with short interval scan. They appeared too small at this time to otherwise to classify.  In a visit lasting 45 minutes, greater than 50% of the time was spent face to face discussing his prior course, and coordinating the patient's care.   The above documentation reflects my direct findings during this shared patient visit. Please see the separate note by Dr. Lisbeth Renshaw on this date for the remainder of the patient's plan of care.    Carola Rhine, PAC

## 2017-08-27 NOTE — Telephone Encounter (Signed)
Oral Oncology Pharmacist Encounter  Received new prescription for Xeloda (capecitabine) for the treatment of progressive, metastatic rectal cancer in conjunction with radiation, planned duration 3 weeks of radiation treatment.  Patient has been on maintenance treatment with Xeloda and Vectibix, this is been discontinued due to progression of disease. Patient will now take Xeloda concurrently with radiation for continued treatment of his disease.  Labs from 08/11/2017, okay for treatment assessed.  Current medication list in Epic reviewed, no significant DDIs with Xeloda identified.  Prescription has been e-scribed to the [facility] for benefits analysis and approval.  Oral Oncology Clinic will continue to follow for insurance authorization, copayment issues, initial counseling and start date.  Johny Drilling, PharmD, BCPS, BCOP 08/27/2017 10:17 AM Oral Oncology Clinic [phone]

## 2017-08-27 NOTE — Telephone Encounter (Signed)
Oral Chemotherapy Pharmacist Encounter   I spoke with patient for overview of: Xeloda.   Counseled patient on administration, dosing, side effects, monitoring, drug-food interactions, safe handling, storage, and disposal.  Patient will take Xeloda 500mg  tablets, 3 tablets (1500mg ) by mouth in AM and 3 tabs (1500mg ) by mouth in PM, within 30 minutes of finishing meals, on days of radiation only.  Xeloda and radiation start date: TBD, week of 09/01/17  Side effects include but not limited to: fatigue, decreased blood counts, GI upset, diarrhea, and hand-foot syndrome. Patient has loperamide at home and will call the office if diarrhea develops.    Reviewed with patient importance of keeping a medication schedule and plan for any missed doses.  Mr. Schewe voiced understanding and appreciation.   All questions answered.  Medication reconciliation performed and medication/allergy list updated.  Patient with recent discontinuation of combination maintenance therapy of Xeloda and Vectibix. Patient just received new fill of Xeloda this past week. He has #80 pills remaining, and needs #90 pills for his entire radiation course. Will update quantity to dispense with the pharmacy to ensure patient does not have surplus of Xeloda at the house.  Patient knows to call the office with questions or concerns. Oral Oncology Clinic will continue to follow.  Thank you,  Johny Drilling, PharmD, BCPS, BCOP 08/27/2017  11:41 AM Oral Oncology Clinic [phone]

## 2017-08-28 DIAGNOSIS — C20 Malignant neoplasm of rectum: Secondary | ICD-10-CM | POA: Diagnosis not present

## 2017-08-29 MED FILL — CAPECITABINE 500 MG TABLET: 500 | 2 days supply | Qty: 10 | Fill #0

## 2017-08-31 ENCOUNTER — Other Ambulatory Visit: Payer: Self-pay | Admitting: Oncology

## 2017-09-01 ENCOUNTER — Ambulatory Visit: Payer: Medicare HMO

## 2017-09-01 ENCOUNTER — Other Ambulatory Visit: Payer: Medicare HMO

## 2017-09-01 ENCOUNTER — Ambulatory Visit: Payer: Medicare HMO | Admitting: Oncology

## 2017-09-01 ENCOUNTER — Ambulatory Visit
Admission: RE | Admit: 2017-09-01 | Discharge: 2017-09-01 | Disposition: A | Discharge status: Home / Self Care | Payer: Medicare HMO | Source: Ambulatory Visit | Attending: Radiation Oncology | Admitting: Radiation Oncology

## 2017-09-01 DIAGNOSIS — C20 Malignant neoplasm of rectum: Secondary | ICD-10-CM | POA: Diagnosis not present

## 2017-09-01 DIAGNOSIS — Z510 Encounter for antineoplastic radiation therapy: Secondary | ICD-10-CM | POA: Diagnosis not present

## 2017-09-02 ENCOUNTER — Ambulatory Visit
Admission: RE | Admit: 2017-09-02 | Discharge: 2017-09-02 | Disposition: A | Discharge status: Home / Self Care | Payer: Medicare HMO | Source: Ambulatory Visit | Attending: Radiation Oncology | Admitting: Radiation Oncology

## 2017-09-02 DIAGNOSIS — C20 Malignant neoplasm of rectum: Secondary | ICD-10-CM | POA: Diagnosis not present

## 2017-09-02 DIAGNOSIS — Z510 Encounter for antineoplastic radiation therapy: Secondary | ICD-10-CM | POA: Diagnosis not present

## 2017-09-03 ENCOUNTER — Ambulatory Visit
Admission: RE | Admit: 2017-09-03 | Discharge: 2017-09-03 | Disposition: A | Discharge status: Home / Self Care | Payer: Medicare HMO | Source: Ambulatory Visit | Attending: Radiation Oncology | Admitting: Radiation Oncology

## 2017-09-03 DIAGNOSIS — C20 Malignant neoplasm of rectum: Secondary | ICD-10-CM | POA: Diagnosis not present

## 2017-09-03 DIAGNOSIS — Z510 Encounter for antineoplastic radiation therapy: Secondary | ICD-10-CM | POA: Diagnosis not present

## 2017-09-04 ENCOUNTER — Ambulatory Visit
Admission: RE | Admit: 2017-09-04 | Discharge: 2017-09-04 | Disposition: A | Discharge status: Home / Self Care | Payer: Medicare HMO | Source: Ambulatory Visit | Attending: Radiation Oncology | Admitting: Radiation Oncology

## 2017-09-04 ENCOUNTER — Ambulatory Visit: Payer: Medicare HMO | Admitting: Radiation Oncology

## 2017-09-04 DIAGNOSIS — C20 Malignant neoplasm of rectum: Secondary | ICD-10-CM | POA: Diagnosis not present

## 2017-09-04 DIAGNOSIS — Z510 Encounter for antineoplastic radiation therapy: Secondary | ICD-10-CM | POA: Diagnosis not present

## 2017-09-05 ENCOUNTER — Ambulatory Visit
Admission: RE | Admit: 2017-09-05 | Discharge: 2017-09-05 | Disposition: A | Discharge status: Home / Self Care | Payer: Medicare HMO | Source: Ambulatory Visit | Attending: Radiation Oncology | Admitting: Radiation Oncology

## 2017-09-05 DIAGNOSIS — C20 Malignant neoplasm of rectum: Secondary | ICD-10-CM | POA: Diagnosis not present

## 2017-09-05 DIAGNOSIS — Z510 Encounter for antineoplastic radiation therapy: Secondary | ICD-10-CM | POA: Diagnosis not present

## 2017-09-08 ENCOUNTER — Ambulatory Visit
Admission: RE | Admit: 2017-09-08 | Discharge: 2017-09-08 | Disposition: A | Discharge status: Home / Self Care | Payer: Medicare HMO | Source: Ambulatory Visit | Attending: Radiation Oncology | Admitting: Radiation Oncology

## 2017-09-08 DIAGNOSIS — C20 Malignant neoplasm of rectum: Secondary | ICD-10-CM | POA: Diagnosis not present

## 2017-09-08 DIAGNOSIS — Z510 Encounter for antineoplastic radiation therapy: Secondary | ICD-10-CM | POA: Diagnosis not present

## 2017-09-09 ENCOUNTER — Ambulatory Visit
Admission: RE | Admit: 2017-09-09 | Discharge: 2017-09-09 | Disposition: A | Discharge status: Home / Self Care | Payer: Medicare HMO | Source: Ambulatory Visit | Attending: Radiation Oncology | Admitting: Radiation Oncology

## 2017-09-09 DIAGNOSIS — C20 Malignant neoplasm of rectum: Secondary | ICD-10-CM | POA: Diagnosis not present

## 2017-09-09 DIAGNOSIS — Z510 Encounter for antineoplastic radiation therapy: Secondary | ICD-10-CM | POA: Diagnosis not present

## 2017-09-10 ENCOUNTER — Ambulatory Visit
Admission: RE | Admit: 2017-09-10 | Discharge: 2017-09-10 | Disposition: A | Discharge status: Home / Self Care | Payer: Medicare HMO | Source: Ambulatory Visit | Attending: Radiation Oncology | Admitting: Radiation Oncology

## 2017-09-10 DIAGNOSIS — C20 Malignant neoplasm of rectum: Secondary | ICD-10-CM | POA: Diagnosis not present

## 2017-09-10 DIAGNOSIS — Z510 Encounter for antineoplastic radiation therapy: Secondary | ICD-10-CM | POA: Diagnosis not present

## 2017-09-11 ENCOUNTER — Inpatient Hospital Stay: Payer: Medicare HMO | Attending: Nurse Practitioner

## 2017-09-11 ENCOUNTER — Encounter: Payer: Self-pay | Admitting: Nurse Practitioner

## 2017-09-11 ENCOUNTER — Ambulatory Visit
Admission: RE | Admit: 2017-09-11 | Discharge: 2017-09-11 | Disposition: A | Discharge status: Home / Self Care | Payer: Medicare HMO | Source: Ambulatory Visit | Attending: Radiation Oncology | Admitting: Radiation Oncology

## 2017-09-11 ENCOUNTER — Inpatient Hospital Stay (HOSPITAL_BASED_OUTPATIENT_CLINIC_OR_DEPARTMENT_OTHER): Payer: Medicare HMO | Admitting: Nurse Practitioner

## 2017-09-11 ENCOUNTER — Inpatient Hospital Stay: Payer: Medicare HMO

## 2017-09-11 ENCOUNTER — Telehealth: Payer: Self-pay | Admitting: Nurse Practitioner

## 2017-09-11 VITALS — BP 136/82 | HR 76 | Temp 97.6°F | Resp 18 | Wt 187.3 lb

## 2017-09-11 DIAGNOSIS — C20 Malignant neoplasm of rectum: Secondary | ICD-10-CM

## 2017-09-11 DIAGNOSIS — G893 Neoplasm related pain (acute) (chronic): Secondary | ICD-10-CM | POA: Insufficient documentation

## 2017-09-11 DIAGNOSIS — C7800 Secondary malignant neoplasm of unspecified lung: Secondary | ICD-10-CM | POA: Insufficient documentation

## 2017-09-11 DIAGNOSIS — C787 Secondary malignant neoplasm of liver and intrahepatic bile duct: Secondary | ICD-10-CM

## 2017-09-11 DIAGNOSIS — R21 Rash and other nonspecific skin eruption: Secondary | ICD-10-CM | POA: Insufficient documentation

## 2017-09-11 DIAGNOSIS — Z923 Personal history of irradiation: Secondary | ICD-10-CM | POA: Insufficient documentation

## 2017-09-11 DIAGNOSIS — Z9221 Personal history of antineoplastic chemotherapy: Secondary | ICD-10-CM | POA: Insufficient documentation

## 2017-09-11 DIAGNOSIS — D701 Agranulocytosis secondary to cancer chemotherapy: Secondary | ICD-10-CM | POA: Insufficient documentation

## 2017-09-11 DIAGNOSIS — Z510 Encounter for antineoplastic radiation therapy: Secondary | ICD-10-CM | POA: Diagnosis not present

## 2017-09-11 DIAGNOSIS — Z8042 Family history of malignant neoplasm of prostate: Secondary | ICD-10-CM | POA: Diagnosis not present

## 2017-09-11 DIAGNOSIS — Z95828 Presence of other vascular implants and grafts: Secondary | ICD-10-CM

## 2017-09-11 LAB — CBC WITH DIFFERENTIAL (CANCER CENTER ONLY)
Basophils Absolute: 0 10*3/uL (ref 0.0–0.1)
Basophils Relative: 0 %
EOS PCT: 2 %
Eosinophils Absolute: 0.1 10*3/uL (ref 0.0–0.5)
HCT: 42 % (ref 38.4–49.9)
Hemoglobin: 14 g/dL (ref 13.0–17.1)
LYMPHS ABS: 1.1 10*3/uL (ref 0.9–3.3)
LYMPHS PCT: 26 %
MCH: 36.1 pg — AB (ref 27.2–33.4)
MCHC: 33.3 g/dL (ref 32.0–36.0)
MCV: 108.5 fL — AB (ref 79.3–98.0)
MONO ABS: 0.3 10*3/uL (ref 0.1–0.9)
Monocytes Relative: 7 %
Neutro Abs: 2.7 10*3/uL (ref 1.5–6.5)
Neutrophils Relative %: 65 %
Platelet Count: 163 10*3/uL (ref 140–400)
RBC: 3.88 MIL/uL — ABNORMAL LOW (ref 4.20–5.82)
RDW: 16.1 % — AB (ref 11.0–14.6)
WBC Count: 4.1 10*3/uL (ref 4.0–10.3)

## 2017-09-11 LAB — CMP (CANCER CENTER ONLY)
ALBUMIN: 3.8 g/dL (ref 3.5–5.0)
ALT: 16 U/L (ref 0–55)
AST: 21 U/L (ref 5–34)
Alkaline Phosphatase: 105 U/L (ref 40–150)
Anion gap: 7 (ref 3–11)
BILIRUBIN TOTAL: 0.5 mg/dL (ref 0.2–1.2)
BUN: 13 mg/dL (ref 7–26)
CO2: 23 mmol/L (ref 22–29)
Calcium: 9.5 mg/dL (ref 8.4–10.4)
Chloride: 108 mmol/L (ref 98–109)
Creatinine: 1.09 mg/dL (ref 0.70–1.30)
GFR, Est AFR Am: >60 mL/min (ref >60)
GFR, Estimated: >60 mL/min (ref >60)
GLUCOSE: 113 mg/dL (ref 70–140)
POTASSIUM: 4.3 mmol/L (ref 3.5–5.1)
Sodium: 138 mmol/L (ref 136–145)
TOTAL PROTEIN: 7.5 g/dL (ref 6.4–8.3)

## 2017-09-11 LAB — MAGNESIUM: Magnesium: 2 mg/dL (ref 1.5–2.5)

## 2017-09-11 MED ORDER — SODIUM CHLORIDE 0.9 % IJ SOLN
10.0000 mL | INTRAMUSCULAR | Status: DC | PRN
  Administered 2017-09-11: 10 mL via INTRAVENOUS
  Filled 2017-09-11: qty 10

## 2017-09-11 MED ORDER — HYDROCODONE-ACETAMINOPHEN 5-325 MG PO TABS: 1.0000 | ORAL_TABLET | Freq: Four times a day (QID) | ORAL | 0 refills | Status: DC | PRN

## 2017-09-11 MED ORDER — HEPARIN SOD (PORK) LOCK FLUSH 100 UNIT/ML IV SOLN
500.0000 [IU] | Freq: Once | INTRAVENOUS | Status: AC | PRN
  Administered 2017-09-11: 500 [IU] via INTRAVENOUS
  Filled 2017-09-11: qty 5

## 2017-09-11 NOTE — Progress Notes (Signed)
[place] OFFICE PROGRESS NOTE   Diagnosis: Rectal cancer  INTERVAL HISTORY:   Hunter Walker returns as scheduled.  He began radiation/Xeloda 09/01/2017.  He denies nausea/vomiting.  No mouth sores.  No diarrhea.  No hand or foot pain or redness.  He notes he is having frequent, small, soft bowel movements.  He intermittently notes blood.  He continues to have rectal pain.  He gets partial relief with 1 hydrocodone tablet.  Objective:  Vital signs in last 24 hours:  Blood pressure 136/82, pulse 76, temperature 97.6 F (36.4 C), temperature source Oral, resp. rate 18, weight 187 lb 4.8 oz (85 kg), SpO2 100 %.    HEENT: No thrush or ulcers. Resp: Lungs clear bilaterally. Cardio: Regular rate and rhythm. GI: Abdomen is soft, nontender.  No hepatomegaly. Vascular: No leg edema. Neuro: Alert and oriented. Skin: Generalized dry skin.  Palms without erythema. Port-A-Cath without erythema.   Lab Results:  Lab Results  Component Value Date   WBC 4.1 09/11/2017   HGB 14.0 06/09/2017   HCT 42.0 09/11/2017   MCV 108.5 (H) 09/11/2017   PLT 163 09/11/2017   NEUTROABS 2.7 09/11/2017    Imaging:  No results found.  Medications: I have reviewed the patient's current medications.  Assessment/Plan: 1. Rectal cancer, invasive adenocarcinoma confirmed at colonoscopy 05/22/2015, EUS staging 06/01/2015 revealed a uT3,N1 lesion at 7 cm from the anal verge  Staging CT abdomen/pelvis 05/15/2015 confirmed a mass at the rectosigmoid colon, a suspicious perirectal lymph node, and a 6 mm right liver lesion felt to most likely be a cyst  CT chest 05/30/2015 with indeterminate pulmonary nodules and multiple liver lesions suspicious for metastases  Ultrasound 06/13/2014 with no liver lesions identified  MRI abdomen 06/21/2015 consistent with multiple liver metastases  CT biopsy of liver lesion 06/26/2015. Pathology showed metastatic adenocarcinoma consistent with colonic  primary.MSI-stable, low mutation burden, no RAS or BRAFmutation  Cycle 1 FOLFOX 07/10/2015  Cycle 2 FOLFOX plus Avastin 07/24/2015 with Neulasta support  Cycle 3 FOLFOX plus Avastin 08/07/2015  Cycle 4 FOLFOX plus Avastin 08/21/2015  cycle 5 FOLFOX plus Avastin 09/04/2015  Restaging CTs 09/15/2015 revealed a decrease in the size of liver lesions, decreased rectal primary, stable/decreased size of tiny lung nodules  Cycle 6 FOLFOX plus Avastin 09/18/2015  Cycle 7 FOLFOX plus Avastin 10/02/2015  Cycle 8 FOLFOX plus Avastin 10/16/2015  Cycle 9 FOLFOX plus Avastin 11/06/2015  Cycle 10 FOLFOX plus Avastin 11/20/2015  CTs 12/07/2015-mild decrease in wall thickening at the rectum, decrease in tiny hepatic metastases, stable lung nodules, no evidence of progressive metastatic disease, new left lower lobe infiltrate  Treatment changed to every three-week Avastin with every other week Xeloda beginning 12/11/2015  Restaging CTs 04/12/2016-interval increase in size of adjacent right upper lobe pulmonary nodules and right lower lobe pulmonary nodule; unchanged 5 mm lesion right hepatic lobe; no additional visualized hepatic metastasis. Grossly unchanged wall thickening of the rectum.  Continuation of Xeloda every other week and every three-week AvastinXeloda placed on hold 05/06/2016 due to hand-foot syndrome  Xeloda resumed 05/28/2016 1500 mg every morning and 1000 mg every afternoon 7 days on/7 days off  CT 09/27/2016-enlargement of lung nodules and new liver lesions, stable rectal mass  Cycle 1 FOLFIRI/panitumumab 10/08/2016  Cycle 2 panitumumab 10/23/2016 (FOLFIRI held secondary to neutropenia)  Cycle 3 FOLFIRI/PANITUMUMAB 11/04/2016  Cycle 4 FOLFIRI/panitumumab 11/18/2016  Cycle 5 FOLFIRI/panitumumab 12/16/2016  Cycle 6 FOLFIRI/panitumumab 12/31/2016  CTs 01/16/2017-decreased size of pulmonary nodules and liver lesions. Decreased perirectal  lymph node  Cycle 7  FOLFIRI/PANITUMUMAB 01/28/2017  Cycle 8FOLFIRI/PANITUMUMAB 02/10/2017  Cycle 9 FOLFIRI/PANITUMUMAB 02/24/2017  Cycle 10 FOLFIRI/panitumumab 03/10/2017 (irinotecan held secondary to neutropenia)  Cycle 11 FOLFIRI/panitumumab 03/24/2017  Cycle12 FOLFIRI/Panitumumab 04/07/2017  Restaging CTs 04/25/2017-stable rectal mass with adjacent 8 mm left perirectal nodal metastasis. Prior hepatic metastases not visualized on the current study. Indeterminate 16 mm enhancing lesion present in segment 6, indeterminate. Minimal residual nodularity in the right upper lobe. No new/progressive pulmonary metastases.  Maintenance Xeloda/panitumumab 04/28/2017  Restaging CTs 08/18/2017-new and enlarging bilateral lung nodules; 2 hyperattenuating lesions in the liver appear similar to prior study; progression of wall thickening in the rectum with enlarging perirectal lymph nodes.  Initiation of radiation/Xeloda 09/01/2017 2.Multiple colon polyps on the colonoscopy 05/22/2015, tubular villous adenoma and a tubular adenoma were removed 3.Anemia-likely secondary to rectal bleeding.  4.Family history of multiple cancers including breast and prostate cancer 5.Transient right arm numbness 06/20/2015 6. Port-A-Cath placement 07/06/2015 by Dr. Marcello Moores 7. Mild neutropenia secondary to chemotherapy following cycle 1 FOLFOX. Neulasta added with cycle 2. 8. Delayed nausea following chemotherapy-emend added with cycle 3 FOLFOX;Emend added with cycle 12 FOLFIRI. 9. Early oxaliplatin neuropathy 10. 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 Xeloda resumed at a reduced dose. 11. Bilateral hand weakness. Question related to previous oxaliplatin. Referred to neurology 06/17/2016. 12. Hypertension-amlodipine started 07/08/2016 13. Rash secondary to PANITUMUMAB. He continues minocycline. 14. Neutropenia following cycle 1 FOLFIRI/PANITUMUMAB; neutropenia following cycle 4  FOLFIRI/PANITUMUMAB.   Disposition: [name] appears unchanged.  He will continue radiation/Xeloda.  He notes partial relief of the rectal pain with 1 hydrocodone tablet.  He understands he can take 2 tablets if needed.  We discussed systemic treatment options following completion of radiation.  Dr. Benay Spice recommends a referral to Dr. Aleatha Borer at [facility] for clinical trial availability.  He agrees with this plan.  He will return for a follow-up visit and additional discussion on 09/30/2017.  He will contact the office in the interim with any problems.  Plan reviewed with Dr. Benay Spice.    Ned Card ANP/GNP-BC   09/11/2017  10:25 AM

## 2017-09-11 NOTE — Telephone Encounter (Signed)
Scheduled appt per 4/11 los - patient is aware of appt date and time   

## 2017-09-12 ENCOUNTER — Telehealth: Payer: Self-pay | Admitting: Oncology

## 2017-09-12 ENCOUNTER — Ambulatory Visit
Admission: RE | Admit: 2017-09-12 | Discharge: 2017-09-12 | Disposition: A | Discharge status: Home / Self Care | Payer: Medicare HMO | Source: Ambulatory Visit | Attending: Radiation Oncology | Admitting: Radiation Oncology

## 2017-09-12 DIAGNOSIS — C20 Malignant neoplasm of rectum: Secondary | ICD-10-CM | POA: Diagnosis not present

## 2017-09-12 DIAGNOSIS — Z510 Encounter for antineoplastic radiation therapy: Secondary | ICD-10-CM | POA: Diagnosis not present

## 2017-09-12 NOTE — Telephone Encounter (Signed)
Faxed records to Susie with Dr. Aleatha Borer @ [facility]. Susie will call pt with appt.

## 2017-09-15 ENCOUNTER — Ambulatory Visit
Admission: RE | Admit: 2017-09-15 | Discharge: 2017-09-15 | Disposition: A | Discharge status: Home / Self Care | Payer: Medicare HMO | Source: Ambulatory Visit | Attending: Radiation Oncology | Admitting: Radiation Oncology

## 2017-09-15 DIAGNOSIS — C20 Malignant neoplasm of rectum: Secondary | ICD-10-CM | POA: Diagnosis not present

## 2017-09-15 DIAGNOSIS — Z510 Encounter for antineoplastic radiation therapy: Secondary | ICD-10-CM | POA: Diagnosis not present

## 2017-09-16 ENCOUNTER — Ambulatory Visit
Admission: RE | Admit: 2017-09-16 | Discharge: 2017-09-16 | Disposition: A | Discharge status: Home / Self Care | Payer: Medicare HMO | Source: Ambulatory Visit | Attending: Radiation Oncology | Admitting: Radiation Oncology

## 2017-09-16 DIAGNOSIS — Z510 Encounter for antineoplastic radiation therapy: Secondary | ICD-10-CM | POA: Diagnosis not present

## 2017-09-16 DIAGNOSIS — C20 Malignant neoplasm of rectum: Secondary | ICD-10-CM | POA: Diagnosis not present

## 2017-09-17 ENCOUNTER — Ambulatory Visit
Admission: RE | Admit: 2017-09-17 | Discharge: 2017-09-17 | Disposition: A | Discharge status: Home / Self Care | Payer: Medicare HMO | Source: Ambulatory Visit | Attending: Radiation Oncology | Admitting: Radiation Oncology

## 2017-09-17 DIAGNOSIS — C20 Malignant neoplasm of rectum: Secondary | ICD-10-CM | POA: Diagnosis not present

## 2017-09-17 DIAGNOSIS — Z510 Encounter for antineoplastic radiation therapy: Secondary | ICD-10-CM | POA: Diagnosis not present

## 2017-09-18 ENCOUNTER — Ambulatory Visit
Admission: RE | Admit: 2017-09-18 | Discharge: 2017-09-18 | Disposition: A | Discharge status: Home / Self Care | Payer: Medicare HMO | Source: Ambulatory Visit | Attending: Radiation Oncology | Admitting: Radiation Oncology

## 2017-09-18 ENCOUNTER — Ambulatory Visit: Payer: Medicare Other | Admitting: Internal Medicine

## 2017-09-18 DIAGNOSIS — Z510 Encounter for antineoplastic radiation therapy: Secondary | ICD-10-CM | POA: Diagnosis not present

## 2017-09-18 DIAGNOSIS — C20 Malignant neoplasm of rectum: Secondary | ICD-10-CM | POA: Diagnosis not present

## 2017-09-19 ENCOUNTER — Ambulatory Visit
Admission: RE | Admit: 2017-09-19 | Discharge: 2017-09-19 | Disposition: A | Discharge status: Home / Self Care | Payer: Medicare HMO | Source: Ambulatory Visit | Attending: Radiation Oncology | Admitting: Radiation Oncology

## 2017-09-19 DIAGNOSIS — Z510 Encounter for antineoplastic radiation therapy: Secondary | ICD-10-CM | POA: Diagnosis not present

## 2017-09-19 DIAGNOSIS — C20 Malignant neoplasm of rectum: Secondary | ICD-10-CM | POA: Diagnosis not present

## 2017-09-22 ENCOUNTER — Other Ambulatory Visit: Payer: Medicare HMO

## 2017-09-22 ENCOUNTER — Ambulatory Visit
Admission: RE | Admit: 2017-09-22 | Discharge: 2017-09-22 | Disposition: A | Discharge status: Home / Self Care | Payer: Medicare HMO | Source: Ambulatory Visit | Attending: Radiation Oncology | Admitting: Radiation Oncology

## 2017-09-22 ENCOUNTER — Ambulatory Visit: Payer: Medicare HMO

## 2017-09-22 ENCOUNTER — Ambulatory Visit: Payer: Medicare HMO | Admitting: Oncology

## 2017-09-22 DIAGNOSIS — Z510 Encounter for antineoplastic radiation therapy: Secondary | ICD-10-CM | POA: Diagnosis not present

## 2017-09-22 DIAGNOSIS — C20 Malignant neoplasm of rectum: Secondary | ICD-10-CM | POA: Diagnosis not present

## 2017-09-23 ENCOUNTER — Ambulatory Visit: Payer: Medicare HMO

## 2017-09-23 ENCOUNTER — Encounter: Payer: Self-pay | Admitting: Radiation Oncology

## 2017-09-23 NOTE — Progress Notes (Signed)
  Radiation Oncology         [phone] ________________________________  Name: Hunter Walker MRN: [id number]  Date: 09/23/2017  DOB: [birth year]  End of Treatment Note  Diagnosis:   Rectal Cancer     Indication for treatment:  Curative       Radiation treatment dates:   09/01/2017 - 09/22/2017  Site/dose:   Rectum/ 40 Gy delivered in 16 fractions of 2.5 Gy  Beams/energy:   3D/ 15X  Narrative: The patient tolerated radiation treatment relatively well.   At the beginning of treatment the patient reported having constipation and diarrhea intermittently. He used stool softener and MiraLAX to help with constipation. By the end of treatment his bowels improved becoming more normal, but still on the softer side. He experienced 8/10 rectal pain and would use hydrocodone for relief. He denied rectal bleeding, discharge, skin irritation and nausea throughout treatment.  Plan: The patient has completed radiation treatment. The patient will return to radiation oncology clinic for routine followup in one month. I advised them to call or return sooner if they have any questions or concerns related to their recovery or treatment.  ------------------------------------------------  Jodelle Gross, MD, PhD  This document serves as a record of services personally performed by Kyung Rudd, MD. It was created on his behalf by Margit Banda, a trained medical scribe. The creation of this record is based on the scribe's personal observations and the provider's statements to them. This document has been checked and approved by the attending provider.

## 2017-09-24 ENCOUNTER — Telehealth: Payer: Self-pay | Admitting: Medical Oncology

## 2017-09-24 ENCOUNTER — Other Ambulatory Visit: Payer: Self-pay

## 2017-09-24 ENCOUNTER — Ambulatory Visit: Payer: Medicare HMO

## 2017-09-24 DIAGNOSIS — C20 Malignant neoplasm of rectum: Secondary | ICD-10-CM

## 2017-09-24 MED ORDER — HYDROCODONE-ACETAMINOPHEN 5-325 MG PO TABS: 1.0000 | ORAL_TABLET | Freq: Four times a day (QID) | ORAL | 0 refills | Status: DC | PRN

## 2017-09-24 NOTE — Telephone Encounter (Signed)
Called to inform that paper script ready for pickup

## 2017-09-24 NOTE — Telephone Encounter (Signed)
:   I need my pain med refilled ". Cc Melia Wilkes.

## 2017-09-25 ENCOUNTER — Ambulatory Visit: Payer: Medicare HMO

## 2017-09-25 ENCOUNTER — Telehealth: Payer: Self-pay

## 2017-09-25 NOTE — Telephone Encounter (Signed)
Called to inform pt that script for pain is ready for pickup

## 2017-09-30 ENCOUNTER — Telehealth: Payer: Self-pay

## 2017-09-30 ENCOUNTER — Inpatient Hospital Stay (HOSPITAL_BASED_OUTPATIENT_CLINIC_OR_DEPARTMENT_OTHER): Payer: Medicare HMO | Admitting: Oncology

## 2017-09-30 VITALS — BP 133/83 | HR 76 | Temp 98.5°F | Resp 17 | Ht 72.0 in | Wt 186.2 lb

## 2017-09-30 DIAGNOSIS — R21 Rash and other nonspecific skin eruption: Secondary | ICD-10-CM | POA: Diagnosis not present

## 2017-09-30 DIAGNOSIS — C20 Malignant neoplasm of rectum: Secondary | ICD-10-CM | POA: Diagnosis not present

## 2017-09-30 DIAGNOSIS — Z923 Personal history of irradiation: Secondary | ICD-10-CM | POA: Diagnosis not present

## 2017-09-30 DIAGNOSIS — C7800 Secondary malignant neoplasm of unspecified lung: Secondary | ICD-10-CM | POA: Diagnosis not present

## 2017-09-30 DIAGNOSIS — C787 Secondary malignant neoplasm of liver and intrahepatic bile duct: Secondary | ICD-10-CM | POA: Diagnosis not present

## 2017-09-30 DIAGNOSIS — Z9221 Personal history of antineoplastic chemotherapy: Secondary | ICD-10-CM | POA: Diagnosis not present

## 2017-09-30 DIAGNOSIS — Z8042 Family history of malignant neoplasm of prostate: Secondary | ICD-10-CM

## 2017-09-30 DIAGNOSIS — D701 Agranulocytosis secondary to cancer chemotherapy: Secondary | ICD-10-CM | POA: Diagnosis not present

## 2017-09-30 DIAGNOSIS — G893 Neoplasm related pain (acute) (chronic): Secondary | ICD-10-CM | POA: Diagnosis not present

## 2017-09-30 MED ORDER — HYDROCODONE-ACETAMINOPHEN 5-325 MG PO TABS: 1.0000 | ORAL_TABLET | Freq: Four times a day (QID) | ORAL | 0 refills | Status: DC | PRN

## 2017-09-30 MED ORDER — MINOCYCLINE HCL 100 MG PO CAPS: 100.0000 mg | ORAL_CAPSULE | Freq: Two times a day (BID) | ORAL | 2 refills | Status: DC

## 2017-09-30 NOTE — Telephone Encounter (Signed)
Printed avs and calender of upcoming appointment. Per 4/30 los 

## 2017-09-30 NOTE — Progress Notes (Signed)
OFF PATHWAY REGIMEN - Colorectal  No Change  Continue With Treatment as Ordered.   [id number]:FOLFIRI + Panitumumab q14d (**2 cycles per order sheet**):   A cycle is every 14 days:     Panitumumab      Irinotecan      Leucovorin      5-Fluorouracil      5-Fluorouracil   **Always confirm dose/schedule in your pharmacy ordering system**    Patient Characteristics: Metastatic Colorectal, Second Line, KRAS/NRAS Wild-Type, BRAF Wild-Type/Unknown, No Prior Anti-EGFR Therapy Current evidence of distant metastases<= Yes AJCC T Category: Staged < 8th Ed. AJCC N Category: Staged < 8th Ed. AJCC M Category: Staged < 8th Ed. AJCC 8 Stage Grouping: Staged < 8th Ed. BRAF Mutation Status: Wild Type (no mutation) KRAS/NRAS Mutation Status: Wild Type (no mutation) Line of therapy: Second Line  Intent of Therapy: Non-Curative / Palliative Intent, Discussed with Patient

## 2017-09-30 NOTE — Progress Notes (Signed)
[place] OFFICE PROGRESS NOTE   Diagnosis: Rectal cancer  INTERVAL HISTORY:   Hunter Walker returns as scheduled.  He completed radiation to the rectum on 09/22/2017.  He reports no improvement in the rectal pain the pain is worse with sitting.  He takes hydrocodone approximately 4 times per day.  The skin rash has resolved.  He is having bowel movements.  There is pain when he has a bowel movement.  He reports a poor appetite.  Objective:  Vital signs in last 24 hours:  Blood pressure 133/83, pulse 76, temperature 98.5 F (36.9 C), temperature source Oral, resp. rate 17, height 6' (1.829 m), weight 186 lb 3.2 oz (84.5 kg), SpO2 100 %.    HEENT: No thrush Resp: Lungs clear bilaterally Cardio: Regular rate and rhythm GI: No hepatomegaly, nontender Vascular: No leg edema  Skin: Skin over the back, palms without erythema  Portacath/PICC-without erythema  Lab Results:  Lab Results  Component Value Date   WBC 4.1 09/11/2017   HGB 14.0 09/11/2017   HCT 42.0 09/11/2017   MCV 108.5 (H) 09/11/2017   PLT 163 09/11/2017   NEUTROABS 2.7 09/11/2017    CMP     Component Value Date/Time   NA 138 09/11/2017 0925   NA 140 05/19/2017 0858   K 4.3 09/11/2017 0925   K 4.1 05/19/2017 0858   CL 108 09/11/2017 0925   CO2 23 09/11/2017 0925   CO2 23 05/19/2017 0858   GLUCOSE 113 09/11/2017 0925   GLUCOSE 80 05/19/2017 0858   BUN 13 09/11/2017 0925   BUN 14.4 05/19/2017 0858   CREATININE 1.09 09/11/2017 0925   CREATININE 1.0 05/19/2017 0858   CALCIUM 9.5 09/11/2017 0925   CALCIUM 9.0 05/19/2017 0858   PROT 7.5 09/11/2017 0925   PROT 7.1 05/19/2017 0858   ALBUMIN 3.8 09/11/2017 0925   ALBUMIN 3.5 05/19/2017 0858   AST 21 09/11/2017 0925   AST 21 05/19/2017 0858   ALT 16 09/11/2017 0925   ALT 16 05/19/2017 0858   ALKPHOS 105 09/11/2017 0925   ALKPHOS 131 05/19/2017 0858   BILITOT 0.5 09/11/2017 0925   BILITOT 0.57 05/19/2017 0858   GFRNONAA >60 09/11/2017  0925   GFRAA >60 09/11/2017 0925    Lab Results  Component Value Date   CEA1 3.91 08/11/2017    Medications: I have reviewed the patient's current medications.   Assessment/Plan: 1. Rectal cancer, invasive adenocarcinoma confirmed at colonoscopy 05/22/2015, EUS staging 06/01/2015 revealed a uT3,N1 lesion at 7 cm from the anal verge  Staging CT abdomen/pelvis 05/15/2015 confirmed a mass at the rectosigmoid colon, a suspicious perirectal lymph node, and a 6 mm right liver lesion felt to most likely be a cyst  CT chest 05/30/2015 with indeterminate pulmonary nodules and multiple liver lesions suspicious for metastases  Ultrasound 06/13/2014 with no liver lesions identified  MRI abdomen 06/21/2015 consistent with multiple liver metastases  CT biopsy of liver lesion 06/26/2015. Pathology showed metastatic adenocarcinoma consistent with colonic primary.MSI-stable, low mutation burden, no RAS or BRAFmutation  Cycle 1 FOLFOX 07/10/2015  Cycle 2 FOLFOX plus Avastin 07/24/2015 with Neulasta support  Cycle 3 FOLFOX plus Avastin 08/07/2015  Cycle 4 FOLFOX plus Avastin 08/21/2015  cycle 5 FOLFOX plus Avastin 09/04/2015  Restaging CTs 09/15/2015 revealed a decrease in the size of liver lesions, decreased rectal primary, stable/decreased size of tiny lung nodules  Cycle 6 FOLFOX plus Avastin 09/18/2015  Cycle 7 FOLFOX plus Avastin 10/02/2015  Cycle 8 FOLFOX plus Avastin 10/16/2015  Cycle 9 FOLFOX plus Avastin 11/06/2015  Cycle 10 FOLFOX plus Avastin 11/20/2015  CTs 12/07/2015-mild decrease in wall thickening at the rectum, decrease in tiny hepatic metastases, stable lung nodules, no evidence of progressive metastatic disease, new left lower lobe infiltrate  Treatment changed to every three-week Avastin with every other week Xeloda beginning 12/11/2015  Restaging CTs 04/12/2016-interval increase in size of adjacent right upper lobe pulmonary nodules and right lower lobe  pulmonary nodule; unchanged 5 mm lesion right hepatic lobe; no additional visualized hepatic metastasis. Grossly unchanged wall thickening of the rectum.  Continuation of Xeloda every other week and every three-week AvastinXeloda placed on hold 05/06/2016 due to hand-foot syndrome  Xeloda resumed 05/28/2016 1500 mg every morning and 1000 mg every afternoon 7 days on/7 days off  CT 09/27/2016-enlargement of lung nodules and new liver lesions, stable rectal mass  Cycle 1 FOLFIRI/panitumumab 10/08/2016  Cycle 2 panitumumab 10/23/2016 (FOLFIRI held secondary to neutropenia)  Cycle 3 FOLFIRI/PANITUMUMAB 11/04/2016  Cycle 4 FOLFIRI/panitumumab 11/18/2016  Cycle 5 FOLFIRI/panitumumab 12/16/2016  Cycle 6 FOLFIRI/panitumumab 12/31/2016  CTs 01/16/2017-decreased size of pulmonary nodules and liver lesions. Decreased perirectal lymph node  Cycle 7 FOLFIRI/PANITUMUMAB 01/28/2017  Cycle 8FOLFIRI/PANITUMUMAB 02/10/2017  Cycle 9 FOLFIRI/PANITUMUMAB 02/24/2017  Cycle 10 FOLFIRI/panitumumab 03/10/2017 (irinotecan held secondary to neutropenia)  Cycle 11 FOLFIRI/panitumumab 03/24/2017  Cycle12 FOLFIRI/Panitumumab 04/07/2017  Restaging CTs 04/25/2017-stable rectal mass with adjacent 8 mm left perirectal nodal metastasis. Prior hepatic metastases not visualized on the current study. Indeterminate 16 mm enhancing lesion present in segment 6, indeterminate. Minimal residual nodularity in the right upper lobe. No new/progressive pulmonary metastases.  Maintenance Xeloda/panitumumab 04/28/2017  Restaging CTs 08/18/2017-new and enlarging bilateral lung nodules; 2 hyperattenuating lesions in the liver appear similar to prior study; progression of wall thickening in the rectum with enlarging perirectal lymph nodes.  Initiation of radiation/Xeloda 09/01/2017, completed 09/22/2017 2.Multiple colon polyps on the colonoscopy 05/22/2015, tubular villous adenoma and a tubular adenoma were  removed 3.Anemia-likely secondary to rectal bleeding.  4.Family history of multiple cancers including breast and prostate cancer 5.Transient right arm numbness 06/20/2015 6. Port-A-Cath placement 07/06/2015 by Dr. Marcello Moores 7. Mild neutropenia secondary to chemotherapy following cycle 1 FOLFOX. Neulasta added with cycle 2. 8. Delayed nausea following chemotherapy-emend added with cycle 3 FOLFOX;Emend added with cycle 12 FOLFIRI. 9. Early oxaliplatin neuropathy 10. 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 Xeloda resumed at a reduced dose. 11. Bilateral hand weakness. Question related to previous oxaliplatin. Referred to neurology 06/17/2016. 12. Hypertension-amlodipine started 07/08/2016 13. Rash secondary to PANITUMUMAB. He continues minocycline. 14. Neutropenia following cycle 1 FOLFIRI/PANITUMUMAB; neutropenia following cycle 4 FOLFIRI/PANITUMUMAB.    Disposition: Hunter Walker has progressive metastatic rectal cancer.  He completed a course of palliative radiation/Xeloda on 09/22/2017.  He continues to have significant rectal pain.  He will continue hydrocodone as needed.  Hunter Walker has been treated with multiple systemic therapy regimens.  He understands remaining systemic treatment options are limited.  He did not progress while on FOLFIRI/panitumumab.  We will plan on resuming FOLFIRI/panitumumab on 10/13/2017.  We also discussed regorafenib and TAS-102.  He is scheduled to see Dr. Aleatha Borer tomorrow to discuss standard in clinical trial options.  Hunter Walker will return for an office visit on 10/13/2017.    25 minutes were spent with the patient today.  The majority of the time was used for counseling and coordination of care.  Betsy Coder, MD  09/30/2017  10:55 AM

## 2017-10-01 DIAGNOSIS — C20 Malignant neoplasm of rectum: Secondary | ICD-10-CM | POA: Diagnosis not present

## 2017-10-01 DIAGNOSIS — C787 Secondary malignant neoplasm of liver and intrahepatic bile duct: Secondary | ICD-10-CM | POA: Diagnosis not present

## 2017-10-06 ENCOUNTER — Other Ambulatory Visit: Payer: Self-pay

## 2017-10-06 ENCOUNTER — Telehealth: Payer: Self-pay

## 2017-10-06 DIAGNOSIS — C20 Malignant neoplasm of rectum: Secondary | ICD-10-CM

## 2017-10-06 MED ORDER — AMLODIPINE BESYLATE 5 MG PO TABS: 5.0000 mg | ORAL_TABLET | Freq: Every day | ORAL | 2 refills | Status: DC

## 2017-10-07 ENCOUNTER — Other Ambulatory Visit: Payer: Self-pay

## 2017-10-07 MED ORDER — BISACODYL 5 MG PO TBEC: 5.0000 mg | DELAYED_RELEASE_TABLET | Freq: Every day | ORAL | 5 refills | Status: DC | PRN

## 2017-10-09 ENCOUNTER — Encounter: Payer: Self-pay | Admitting: Pharmacist

## 2017-10-12 ENCOUNTER — Other Ambulatory Visit: Payer: Self-pay | Admitting: Oncology

## 2017-10-13 ENCOUNTER — Encounter: Payer: Self-pay | Admitting: Nurse Practitioner

## 2017-10-13 ENCOUNTER — Inpatient Hospital Stay: Payer: Medicare HMO | Attending: Nurse Practitioner | Admitting: Nurse Practitioner

## 2017-10-13 ENCOUNTER — Ambulatory Visit: Payer: Medicare HMO | Admitting: Oncology

## 2017-10-13 ENCOUNTER — Inpatient Hospital Stay: Payer: Medicare HMO

## 2017-10-13 ENCOUNTER — Other Ambulatory Visit: Payer: Medicare HMO

## 2017-10-13 ENCOUNTER — Ambulatory Visit: Payer: Medicare HMO

## 2017-10-13 ENCOUNTER — Telehealth: Payer: Self-pay | Admitting: Nurse Practitioner

## 2017-10-13 ENCOUNTER — Ambulatory Visit: Payer: Medicare HMO | Admitting: Internal Medicine

## 2017-10-13 VITALS — BP 148/93 | HR 62 | Temp 97.4°F | Resp 14 | Ht 72.0 in | Wt 186.5 lb

## 2017-10-13 DIAGNOSIS — Z95828 Presence of other vascular implants and grafts: Secondary | ICD-10-CM

## 2017-10-13 DIAGNOSIS — I10 Essential (primary) hypertension: Secondary | ICD-10-CM | POA: Insufficient documentation

## 2017-10-13 DIAGNOSIS — C20 Malignant neoplasm of rectum: Secondary | ICD-10-CM

## 2017-10-13 DIAGNOSIS — Z9221 Personal history of antineoplastic chemotherapy: Secondary | ICD-10-CM | POA: Diagnosis not present

## 2017-10-13 DIAGNOSIS — Z923 Personal history of irradiation: Secondary | ICD-10-CM | POA: Diagnosis not present

## 2017-10-13 DIAGNOSIS — Z8042 Family history of malignant neoplasm of prostate: Secondary | ICD-10-CM

## 2017-10-13 DIAGNOSIS — C7800 Secondary malignant neoplasm of unspecified lung: Secondary | ICD-10-CM | POA: Diagnosis not present

## 2017-10-13 DIAGNOSIS — Z5112 Encounter for antineoplastic immunotherapy: Secondary | ICD-10-CM | POA: Insufficient documentation

## 2017-10-13 DIAGNOSIS — Z452 Encounter for adjustment and management of vascular access device: Secondary | ICD-10-CM | POA: Insufficient documentation

## 2017-10-13 DIAGNOSIS — C787 Secondary malignant neoplasm of liver and intrahepatic bile duct: Secondary | ICD-10-CM | POA: Diagnosis not present

## 2017-10-13 DIAGNOSIS — G620 Drug-induced polyneuropathy: Secondary | ICD-10-CM | POA: Diagnosis not present

## 2017-10-13 DIAGNOSIS — R21 Rash and other nonspecific skin eruption: Secondary | ICD-10-CM | POA: Diagnosis not present

## 2017-10-13 DIAGNOSIS — Z5111 Encounter for antineoplastic chemotherapy: Secondary | ICD-10-CM | POA: Diagnosis present

## 2017-10-13 DIAGNOSIS — Z803 Family history of malignant neoplasm of breast: Secondary | ICD-10-CM

## 2017-10-13 LAB — CBC WITH DIFFERENTIAL (CANCER CENTER ONLY)
Basophils Absolute: 0 10*3/uL (ref 0.0–0.1)
Basophils Relative: 0 %
Eosinophils Absolute: 0.1 10*3/uL (ref 0.0–0.5)
Eosinophils Relative: 3 %
HEMATOCRIT: 39.5 % (ref 38.4–49.9)
HEMOGLOBIN: 13.3 g/dL (ref 13.0–17.1)
LYMPHS ABS: 1.1 10*3/uL (ref 0.9–3.3)
LYMPHS PCT: 29 %
MCH: 35.8 pg — AB (ref 27.2–33.4)
MCHC: 33.7 g/dL (ref 32.0–36.0)
MCV: 106.5 fL — AB (ref 79.3–98.0)
MONO ABS: 0.5 10*3/uL (ref 0.1–0.9)
MONOS PCT: 13 %
NEUTROS ABS: 2.1 10*3/uL (ref 1.5–6.5)
NEUTROS PCT: 55 %
Platelet Count: 190 10*3/uL (ref 140–400)
RBC: 3.71 MIL/uL — ABNORMAL LOW (ref 4.20–5.82)
RDW: 14.4 % (ref 11.0–14.6)
WBC Count: 3.8 10*3/uL — ABNORMAL LOW (ref 4.0–10.3)

## 2017-10-13 LAB — MAGNESIUM: MAGNESIUM: 1.9 mg/dL (ref 1.7–2.4)

## 2017-10-13 LAB — CMP (CANCER CENTER ONLY)
ALK PHOS: 96 U/L (ref 40–150)
ALT: 18 U/L (ref 0–55)
ANION GAP: 5 (ref 3–11)
AST: 22 U/L (ref 5–34)
Albumin: 3.8 g/dL (ref 3.5–5.0)
BUN: 13 mg/dL (ref 7–26)
CHLORIDE: 109 mmol/L (ref 98–109)
CO2: 26 mmol/L (ref 22–29)
Calcium: 9.5 mg/dL (ref 8.4–10.4)
Creatinine: 1.11 mg/dL (ref 0.70–1.30)
GFR, Estimated: >60 mL/min (ref >60)
GLUCOSE: 88 mg/dL (ref 70–140)
Potassium: 4.1 mmol/L (ref 3.5–5.1)
SODIUM: 140 mmol/L (ref 136–145)
Total Bilirubin: 0.7 mg/dL (ref 0.2–1.2)
Total Protein: 7.3 g/dL (ref 6.4–8.3)

## 2017-10-13 LAB — CEA (IN HOUSE-CHCC): CEA (CHCC-In House): 5.24 ng/mL — ABNORMAL HIGH (ref 0.00–5.00)

## 2017-10-13 MED ORDER — SODIUM CHLORIDE 0.9 % IV SOLN
Freq: Once | INTRAVENOUS | Status: AC
  Administered 2017-10-13: 11:00:00 via INTRAVENOUS

## 2017-10-13 MED ORDER — IRINOTECAN HCL CHEMO INJECTION 100 MG/5ML
125.0000 mg/m2 | Freq: Once | INTRAVENOUS | Status: AC
  Administered 2017-10-13: 260 mg via INTRAVENOUS
  Filled 2017-10-13: qty 13

## 2017-10-13 MED ORDER — ATROPINE SULFATE 1 MG/ML IJ SOLN
INTRAMUSCULAR | Status: AC
Start: 2017-10-13 — End: ?
  Filled 2017-10-13: qty 1

## 2017-10-13 MED ORDER — DEXAMETHASONE SODIUM PHOSPHATE 10 MG/ML IJ SOLN
10.0000 mg | Freq: Once | INTRAMUSCULAR | Status: AC
  Administered 2017-10-13: 10 mg via INTRAVENOUS

## 2017-10-13 MED ORDER — PALONOSETRON HCL INJECTION 0.25 MG/5ML
INTRAVENOUS | Status: AC
  Filled 2017-10-13: qty 5

## 2017-10-13 MED ORDER — SODIUM CHLORIDE 0.9 % IV SOLN
2415.0000 mg/m2 | INTRAVENOUS | Status: DC
  Administered 2017-10-13: 5000 mg via INTRAVENOUS
  Filled 2017-10-13: qty 100

## 2017-10-13 MED ORDER — ATROPINE SULFATE 1 MG/ML IJ SOLN
0.5000 mg | Freq: Once | INTRAMUSCULAR | Status: AC | PRN
  Administered 2017-10-13: 0.5 mg via INTRAVENOUS

## 2017-10-13 MED ORDER — PALONOSETRON HCL INJECTION 0.25 MG/5ML
0.2500 mg | Freq: Once | INTRAVENOUS | Status: AC
  Administered 2017-10-13: 0.25 mg via INTRAVENOUS

## 2017-10-13 MED ORDER — DEXAMETHASONE SODIUM PHOSPHATE 10 MG/ML IJ SOLN
INTRAMUSCULAR | Status: AC
  Filled 2017-10-13: qty 1

## 2017-10-13 MED ORDER — PANITUMUMAB CHEMO INJECTION 100 MG/5ML
6.0000 mg/kg | Freq: Once | INTRAVENOUS | Status: AC
  Administered 2017-10-13: 500 mg via INTRAVENOUS
  Filled 2017-10-13: qty 5

## 2017-10-13 MED ORDER — DEXTROSE 5 % IV SOLN
300.0000 mg/m2 | Freq: Once | INTRAVENOUS | Status: AC
  Administered 2017-10-13: 622 mg via INTRAVENOUS
  Filled 2017-10-13: qty 31.1

## 2017-10-13 MED ORDER — SODIUM CHLORIDE 0.9 % IJ SOLN
10.0000 mL | INTRAMUSCULAR | Status: DC | PRN
  Administered 2017-10-13: 10 mL via INTRAVENOUS
  Filled 2017-10-13: qty 10

## 2017-10-13 MED ORDER — FLUOROURACIL CHEMO INJECTION 2.5 GM/50ML
300.0000 mg/m2 | Freq: Once | INTRAVENOUS | Status: AC
  Administered 2017-10-13: 600 mg via INTRAVENOUS
  Filled 2017-10-13: qty 12

## 2017-10-13 NOTE — Progress Notes (Signed)
[place] OFFICE PROGRESS NOTE   Diagnosis: Rectal cancer  INTERVAL HISTORY:   Hunter Walker returns as scheduled.  He is no longer having rectal pain.  No rectal bleeding.  No nausea or vomiting.  He has periodic constipation.  No rash.  No neuropathy symptoms.  He denies shortness of breath.  No cough.  No fever.  He has a good appetite.  Objective:  Vital signs in last 24 hours:  Blood pressure (!) 148/93, pulse 62, temperature (!) 97.4 F (36.3 C), temperature source Oral, resp. rate 14, height 6' (1.829 m), weight 186 lb 8 oz (84.6 kg), SpO2 97 %.    HEENT: No thrush or ulcers. Resp: Lungs clear bilaterally. Cardio: Regular rate and rhythm. GI: Abdomen soft and nontender.  No hepatomegaly. Vascular: No leg edema. Neuro: Alert and oriented. Skin: No rash.  Palms without erythema. Port-A-Cath without erythema.   Lab Results:  Lab Results  Component Value Date   WBC 3.8 (L) 10/13/2017   HGB 13.3 10/13/2017   HCT 39.5 10/13/2017   MCV 106.5 (H) 10/13/2017   PLT 190 10/13/2017   NEUTROABS 2.1 10/13/2017    Imaging:  No results found.  Medications: I have reviewed the patient's current medications.  Assessment/Plan: 1. Rectal cancer, invasive adenocarcinoma confirmed at colonoscopy 05/22/2015, EUS staging 06/01/2015 revealed a uT3,N1 lesion at 7 cm from the anal verge  Staging CT abdomen/pelvis 05/15/2015 confirmed a mass at the rectosigmoid colon, a suspicious perirectal lymph node, and a 6 mm right liver lesion felt to most likely be a cyst  CT chest 05/30/2015 with indeterminate pulmonary nodules and multiple liver lesions suspicious for metastases  Ultrasound 06/13/2014 with no liver lesions identified  MRI abdomen 06/21/2015 consistent with multiple liver metastases  CT biopsy of liver lesion 06/26/2015. Pathology showed metastatic adenocarcinoma consistent with colonic primary.MSI-stable, low mutation burden, no RAS or  BRAFmutation  Cycle 1 FOLFOX 07/10/2015  Cycle 2 FOLFOX plus Avastin 07/24/2015 with Neulasta support  Cycle 3 FOLFOX plus Avastin 08/07/2015  Cycle 4 FOLFOX plus Avastin 08/21/2015  cycle 5 FOLFOX plus Avastin 09/04/2015  Restaging CTs 09/15/2015 revealed a decrease in the size of liver lesions, decreased rectal primary, stable/decreased size of tiny lung nodules  Cycle 6 FOLFOX plus Avastin 09/18/2015  Cycle 7 FOLFOX plus Avastin 10/02/2015  Cycle 8 FOLFOX plus Avastin 10/16/2015  Cycle 9 FOLFOX plus Avastin 11/06/2015  Cycle 10 FOLFOX plus Avastin 11/20/2015  CTs 12/07/2015-mild decrease in wall thickening at the rectum, decrease in tiny hepatic metastases, stable lung nodules, no evidence of progressive metastatic disease, new left lower lobe infiltrate  Treatment changed to every three-week Avastin with every other week Xeloda beginning 12/11/2015  Restaging CTs 04/12/2016-interval increase in size of adjacent right upper lobe pulmonary nodules and right lower lobe pulmonary nodule; unchanged 5 mm lesion right hepatic lobe; no additional visualized hepatic metastasis. Grossly unchanged wall thickening of the rectum.  Continuation of Xeloda every other week and every three-week AvastinXeloda placed on hold 05/06/2016 due to hand-foot syndrome  Xeloda resumed 05/28/2016 1500 mg every morning and 1000 mg every afternoon 7 days on/7 days off  CT 09/27/2016-enlargement of lung nodules and new liver lesions, stable rectal mass  Cycle 1 FOLFIRI/panitumumab 10/08/2016  Cycle 2 panitumumab 10/23/2016 (FOLFIRI held secondary to neutropenia)  Cycle 3 FOLFIRI/PANITUMUMAB 11/04/2016  Cycle 4 FOLFIRI/panitumumab 11/18/2016  Cycle 5 FOLFIRI/panitumumab 12/16/2016  Cycle 6 FOLFIRI/panitumumab 12/31/2016  CTs 01/16/2017-decreased size of pulmonary nodules and liver lesions. Decreased perirectal lymph node  Cycle 7 FOLFIRI/PANITUMUMAB 01/28/2017  Cycle 8FOLFIRI/PANITUMUMAB  02/10/2017  Cycle 9 FOLFIRI/PANITUMUMAB 02/24/2017  Cycle 10 FOLFIRI/panitumumab 03/10/2017 (irinotecan held secondary to neutropenia)  Cycle 11 FOLFIRI/panitumumab 03/24/2017  Cycle12 FOLFIRI/Panitumumab 04/07/2017  Restaging CTs 04/25/2017-stable rectal mass with adjacent 8 mm left perirectal nodal metastasis. Prior hepatic metastases not visualized on the current study. Indeterminate 16 mm enhancing lesion present in segment 6, indeterminate. Minimal residual nodularity in the right upper lobe. No new/progressive pulmonary metastases.  Maintenance Xeloda/panitumumab 04/28/2017  Restaging CTs 08/18/2017-new and enlarging bilateral lung nodules; 2 hyperattenuating lesions in the liver appear similar to prior study; progression of wall thickening in the rectum with enlarging perirectal lymph nodes.  Initiation of radiation/Xeloda 09/01/2017, completed 09/22/2017  Cycle 1 FOLFIRI/Panitumumab 10/13/2017 2.Multiple colon polyps on the colonoscopy 05/22/2015, tubular villous adenoma and a tubular adenoma were removed 3.Anemia-likely secondary to rectal bleeding.  4.Family history of multiple cancers including breast and prostate cancer 5.Transient right arm numbness 06/20/2015 6. Port-A-Cath placement 07/06/2015 by Dr. Marcello Moores 7. Mild neutropenia secondary to chemotherapy following cycle 1 FOLFOX. Neulasta added with cycle 2. 8. Delayed nausea following chemotherapy-emend added with cycle 3 FOLFOX;Emend added with cycle 12 FOLFIRI. 9. Early oxaliplatin neuropathy 10. 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 Xeloda resumed at a reduced dose. 11. Bilateral hand weakness. Question related to previous oxaliplatin. Referred to neurology 06/17/2016. 12. Hypertension-amlodipine started 07/08/2016 13. Rash secondary to PANITUMUMAB. He continues minocycline. 14. Neutropenia following cycle 1 FOLFIRI/PANITUMUMAB; neutropenia following cycle 4  FOLFIRI/PANITUMUMAB.   Disposition: Hunter Walker appears stable.  The plan is to resume treatment with FOLFIRI/Panitumumab.  We reviewed potential toxicities.  He agrees to proceed.  Labs from today reviewed.  Counts are adequate for treatment.  Rectal pain has resolved.  He will return for lab, follow-up and cycle 2 FOLFIRI/Panitumumab in 2 weeks.  He will contact the office in the interim with any problems.      Ned Card ANP/GNP-BC   10/13/2017  9:48 AM

## 2017-10-13 NOTE — Telephone Encounter (Signed)
Scheduled appt per 5/13 los - unable to schedule treatment for 5/28 due to capped day - logged - will contact patient when appt is added.

## 2017-10-13 NOTE — Patient Instructions (Signed)
[facility] Discharge Instructions for Patients Receiving Chemotherapy  Today you received the following chemotherapy agents Irinotecan, Leucovorin, 5FU  To help prevent nausea and vomiting after your treatment, we encourage you to take your nausea medication as prescribed.   If you develop nausea and vomiting that is not controlled by your nausea medication, call the clinic.   BELOW ARE SYMPTOMS THAT SHOULD BE REPORTED IMMEDIATELY:  *FEVER GREATER THAN 100.5 F  *CHILLS WITH OR WITHOUT FEVER  NAUSEA AND VOMITING THAT IS NOT CONTROLLED WITH YOUR NAUSEA MEDICATION  *UNUSUAL SHORTNESS OF BREATH  *UNUSUAL BRUISING OR BLEEDING  TENDERNESS IN MOUTH AND THROAT WITH OR WITHOUT PRESENCE OF ULCERS  *URINARY PROBLEMS  *BOWEL PROBLEMS  UNUSUAL RASH Items with * indicate a potential emergency and should be followed up as soon as possible.  Feel free to call the clinic should you have any questions or concerns. The clinic phone number is [phone].  Please show the [place] at check-in to the Emergency Department and triage nurse.

## 2017-10-15 ENCOUNTER — Ambulatory Visit: Payer: Medicare HMO | Admitting: Internal Medicine

## 2017-10-15 ENCOUNTER — Telehealth: Payer: Self-pay | Admitting: Oncology

## 2017-10-15 ENCOUNTER — Inpatient Hospital Stay: Payer: Medicare HMO

## 2017-10-15 VITALS — BP 138/78 | HR 72 | Temp 98.6°F | Resp 18

## 2017-10-15 DIAGNOSIS — Z452 Encounter for adjustment and management of vascular access device: Secondary | ICD-10-CM | POA: Diagnosis not present

## 2017-10-15 DIAGNOSIS — C7800 Secondary malignant neoplasm of unspecified lung: Secondary | ICD-10-CM | POA: Diagnosis not present

## 2017-10-15 DIAGNOSIS — R21 Rash and other nonspecific skin eruption: Secondary | ICD-10-CM | POA: Diagnosis not present

## 2017-10-15 DIAGNOSIS — I10 Essential (primary) hypertension: Secondary | ICD-10-CM | POA: Diagnosis not present

## 2017-10-15 DIAGNOSIS — C20 Malignant neoplasm of rectum: Secondary | ICD-10-CM | POA: Diagnosis not present

## 2017-10-15 DIAGNOSIS — Z95828 Presence of other vascular implants and grafts: Secondary | ICD-10-CM

## 2017-10-15 DIAGNOSIS — Z923 Personal history of irradiation: Secondary | ICD-10-CM | POA: Diagnosis not present

## 2017-10-15 DIAGNOSIS — Z9221 Personal history of antineoplastic chemotherapy: Secondary | ICD-10-CM | POA: Diagnosis not present

## 2017-10-15 DIAGNOSIS — C787 Secondary malignant neoplasm of liver and intrahepatic bile duct: Secondary | ICD-10-CM | POA: Diagnosis not present

## 2017-10-15 DIAGNOSIS — G620 Drug-induced polyneuropathy: Secondary | ICD-10-CM | POA: Diagnosis not present

## 2017-10-15 MED ORDER — SODIUM CHLORIDE 0.9 % IJ SOLN
10.0000 mL | INTRAMUSCULAR | Status: DC | PRN
Start: 2017-10-15 — End: 2017-10-15
  Administered 2017-10-15: 10 mL via INTRAVENOUS
  Filled 2017-10-15: qty 10

## 2017-10-15 MED ORDER — HEPARIN SOD (PORK) LOCK FLUSH 100 UNIT/ML IV SOLN
500.0000 [IU] | Freq: Once | INTRAVENOUS | Status: AC | PRN
  Administered 2017-10-15: 500 [IU] via INTRAVENOUS
  Filled 2017-10-15: qty 5

## 2017-10-15 NOTE — Telephone Encounter (Signed)
Scheduled appt per 5/13 loa - pt unable to come in week of 5/28 - appts scheduled to the next week and all other appts adjusted,.- pt is aware and pick up an updated schedule today when he comes in for pump d/c

## 2017-10-16 ENCOUNTER — Ambulatory Visit (INDEPENDENT_AMBULATORY_CARE_PROVIDER_SITE_OTHER): Payer: Medicare HMO | Admitting: Internal Medicine

## 2017-10-16 ENCOUNTER — Encounter: Payer: Self-pay | Admitting: Internal Medicine

## 2017-10-16 VITALS — BP 110/80 | HR 75 | Temp 98.5°F | Ht 72.0 in | Wt 185.0 lb

## 2017-10-16 DIAGNOSIS — I10 Essential (primary) hypertension: Secondary | ICD-10-CM

## 2017-10-16 DIAGNOSIS — Z0000 Encounter for general adult medical examination without abnormal findings: Secondary | ICD-10-CM | POA: Diagnosis not present

## 2017-10-16 NOTE — Assessment & Plan Note (Signed)
BP at goal on amlodipine. Recent labs okay. Can hold if needed during chemo for low BP.

## 2017-10-16 NOTE — Progress Notes (Signed)
   Subjective:    Patient ID: Hunter Walker, male    DOB: 08/25/1950, 67 y.o.   MRN: [id number]  HPI The patient is a 67 YO man coming in for physical.   PMH, [place], social history reviewed and updated  Review of Systems  Constitutional: Positive for activity change. Negative for appetite change, fatigue, fever and unexpected weight change.  HENT: Negative.   Eyes: Negative.   Respiratory: Negative for cough, chest tightness and shortness of breath.   Cardiovascular: Negative for chest pain, palpitations and leg swelling.  Gastrointestinal: Positive for constipation and nausea. Negative for abdominal distention, abdominal pain, diarrhea and vomiting.  Musculoskeletal: Negative.   Skin: Negative.   Neurological: Negative.   Psychiatric/Behavioral: Negative.       Objective:   Physical Exam  Constitutional: He is oriented to person, place, and time. He appears well-developed and well-nourished.  HENT:  Head: Normocephalic and atraumatic.  Eyes: EOM are normal.  Neck: Normal range of motion.  Cardiovascular: Normal rate and regular rhythm.  Pulmonary/Chest: Effort normal and breath sounds normal. No respiratory distress. He has no wheezes. He has no rales.  Abdominal: Soft. Bowel sounds are normal. He exhibits no distension. There is no tenderness. There is no rebound.  Musculoskeletal: He exhibits no edema.  Neurological: He is alert and oriented to person, place, and time. Coordination normal.  Skin: Skin is warm and dry.  Psychiatric: He has a normal mood and affect.   Vitals:   10/16/17 0908  BP: 110/80  Pulse: 75  Temp: 98.5 F (36.9 C)  TempSrc: Oral  SpO2: 99%  Weight: 185 lb (83.9 kg)  Height: 6' (1.829 m)      Assessment & Plan:

## 2017-10-16 NOTE — Patient Instructions (Signed)
We will not make any changes today.   

## 2017-10-16 NOTE — Assessment & Plan Note (Signed)
No shots today per patient request as actively going through chemo currently. Declines hep c and hiv screening need today. Counseled about sun safety and mental health care. Given 10 year screening recommendations.

## 2017-10-22 ENCOUNTER — Ambulatory Visit: Payer: Self-pay | Admitting: Radiation Oncology

## 2017-10-30 ENCOUNTER — Ambulatory Visit: Payer: Medicare HMO | Admitting: Oncology

## 2017-10-30 ENCOUNTER — Ambulatory Visit: Payer: Medicare HMO

## 2017-11-02 ENCOUNTER — Other Ambulatory Visit: Payer: Self-pay | Admitting: Oncology

## 2017-11-03 ENCOUNTER — Other Ambulatory Visit: Payer: Medicare HMO

## 2017-11-03 ENCOUNTER — Inpatient Hospital Stay: Payer: Medicare HMO

## 2017-11-03 ENCOUNTER — Ambulatory Visit: Payer: Medicare HMO | Admitting: Oncology

## 2017-11-03 ENCOUNTER — Inpatient Hospital Stay: Payer: Medicare HMO | Attending: Nurse Practitioner | Admitting: Oncology

## 2017-11-03 ENCOUNTER — Telehealth: Payer: Self-pay | Admitting: Oncology

## 2017-11-03 ENCOUNTER — Ambulatory Visit: Payer: Medicare HMO

## 2017-11-03 VITALS — BP 131/85 | HR 61 | Temp 97.8°F | Resp 17 | Ht 72.0 in | Wt 189.3 lb

## 2017-11-03 DIAGNOSIS — C20 Malignant neoplasm of rectum: Secondary | ICD-10-CM | POA: Insufficient documentation

## 2017-11-03 DIAGNOSIS — Z452 Encounter for adjustment and management of vascular access device: Secondary | ICD-10-CM | POA: Diagnosis present

## 2017-11-03 DIAGNOSIS — Z923 Personal history of irradiation: Secondary | ICD-10-CM

## 2017-11-03 DIAGNOSIS — C787 Secondary malignant neoplasm of liver and intrahepatic bile duct: Secondary | ICD-10-CM | POA: Diagnosis not present

## 2017-11-03 DIAGNOSIS — Z79899 Other long term (current) drug therapy: Secondary | ICD-10-CM | POA: Diagnosis not present

## 2017-11-03 DIAGNOSIS — L270 Generalized skin eruption due to drugs and medicaments taken internally: Secondary | ICD-10-CM | POA: Insufficient documentation

## 2017-11-03 DIAGNOSIS — G620 Drug-induced polyneuropathy: Secondary | ICD-10-CM | POA: Diagnosis not present

## 2017-11-03 DIAGNOSIS — D701 Agranulocytosis secondary to cancer chemotherapy: Secondary | ICD-10-CM | POA: Insufficient documentation

## 2017-11-03 DIAGNOSIS — Z5189 Encounter for other specified aftercare: Secondary | ICD-10-CM | POA: Diagnosis not present

## 2017-11-03 DIAGNOSIS — R21 Rash and other nonspecific skin eruption: Secondary | ICD-10-CM

## 2017-11-03 DIAGNOSIS — Z5111 Encounter for antineoplastic chemotherapy: Secondary | ICD-10-CM | POA: Diagnosis not present

## 2017-11-03 DIAGNOSIS — Z95828 Presence of other vascular implants and grafts: Secondary | ICD-10-CM

## 2017-11-03 LAB — CBC WITH DIFFERENTIAL (CANCER CENTER ONLY)
BASOS PCT: 1 %
Basophils Absolute: 0 10*3/uL (ref 0.0–0.1)
Eosinophils Absolute: 0.1 10*3/uL (ref 0.0–0.5)
Eosinophils Relative: 5 %
HEMATOCRIT: 41.2 % (ref 38.4–49.9)
Hemoglobin: 13.8 g/dL (ref 13.0–17.1)
Lymphocytes Relative: 37 %
Lymphs Abs: 1.1 10*3/uL (ref 0.9–3.3)
MCH: 35.3 pg — ABNORMAL HIGH (ref 27.2–33.4)
MCHC: 33.5 g/dL (ref 32.0–36.0)
MCV: 105.4 fL — ABNORMAL HIGH (ref 79.3–98.0)
MONO ABS: 0.4 10*3/uL (ref 0.1–0.9)
MONOS PCT: 14 %
NEUTROS ABS: 1.3 10*3/uL — AB (ref 1.5–6.5)
Neutrophils Relative %: 43 %
Platelet Count: 179 10*3/uL (ref 140–400)
RBC: 3.91 MIL/uL — ABNORMAL LOW (ref 4.20–5.82)
RDW: 14.6 % (ref 11.0–14.6)
WBC Count: 2.9 10*3/uL — ABNORMAL LOW (ref 4.0–10.3)

## 2017-11-03 LAB — CMP (CANCER CENTER ONLY)
ALBUMIN: 3.7 g/dL (ref 3.5–5.0)
ALK PHOS: 120 U/L (ref 40–150)
ALT: 17 U/L (ref 0–55)
ANION GAP: 7 (ref 3–11)
AST: 22 U/L (ref 5–34)
BUN: 13 mg/dL (ref 7–26)
CO2: 25 mmol/L (ref 22–29)
CREATININE: 1.07 mg/dL (ref 0.70–1.30)
Calcium: 9 mg/dL (ref 8.4–10.4)
Chloride: 108 mmol/L (ref 98–109)
GFR, Est AFR Am: >60 mL/min (ref >60)
GFR, Estimated: >60 mL/min (ref >60)
GLUCOSE: 85 mg/dL (ref 70–140)
Potassium: 3.9 mmol/L (ref 3.5–5.1)
SODIUM: 140 mmol/L (ref 136–145)
Total Bilirubin: 0.5 mg/dL (ref 0.2–1.2)
Total Protein: 7.2 g/dL (ref 6.4–8.3)

## 2017-11-03 LAB — MAGNESIUM: Magnesium: 1.8 mg/dL (ref 1.7–2.4)

## 2017-11-03 MED ORDER — LEUCOVORIN CALCIUM INJECTION 350 MG
300.0000 mg/m2 | Freq: Once | INTRAVENOUS | Status: AC
  Administered 2017-11-03: 622 mg via INTRAVENOUS
  Filled 2017-11-03: qty 31.1

## 2017-11-03 MED ORDER — SODIUM CHLORIDE 0.9 % IV SOLN
2425.0000 mg/m2 | INTRAVENOUS | Status: DC
  Administered 2017-11-03: 5000 mg via INTRAVENOUS
  Filled 2017-11-03: qty 100

## 2017-11-03 MED ORDER — PALONOSETRON HCL INJECTION 0.25 MG/5ML
INTRAVENOUS | Status: AC
  Filled 2017-11-03: qty 5

## 2017-11-03 MED ORDER — IRINOTECAN HCL CHEMO INJECTION 100 MG/5ML
125.0000 mg/m2 | Freq: Once | INTRAVENOUS | Status: AC
  Administered 2017-11-03: 260 mg via INTRAVENOUS
  Filled 2017-11-03: qty 13

## 2017-11-03 MED ORDER — FLUOROURACIL CHEMO INJECTION 2.5 GM/50ML
300.0000 mg/m2 | Freq: Once | INTRAVENOUS | Status: AC
  Administered 2017-11-03: 600 mg via INTRAVENOUS
  Filled 2017-11-03: qty 12

## 2017-11-03 MED ORDER — DEXAMETHASONE SODIUM PHOSPHATE 10 MG/ML IJ SOLN
INTRAMUSCULAR | Status: AC
  Filled 2017-11-03: qty 1

## 2017-11-03 MED ORDER — SODIUM CHLORIDE 0.9 % IJ SOLN
10.0000 mL | INTRAMUSCULAR | Status: DC | PRN
  Administered 2017-11-03: 10 mL via INTRAVENOUS
  Filled 2017-11-03: qty 10

## 2017-11-03 MED ORDER — PALONOSETRON HCL INJECTION 0.25 MG/5ML
0.2500 mg | Freq: Once | INTRAVENOUS | Status: AC
  Administered 2017-11-03: 0.25 mg via INTRAVENOUS

## 2017-11-03 MED ORDER — SODIUM CHLORIDE 0.9 % IV SOLN
Freq: Once | INTRAVENOUS | Status: AC
  Administered 2017-11-03: 13:00:00 via INTRAVENOUS

## 2017-11-03 MED ORDER — DEXAMETHASONE SODIUM PHOSPHATE 10 MG/ML IJ SOLN
10.0000 mg | Freq: Once | INTRAMUSCULAR | Status: AC
  Administered 2017-11-03: 10 mg via INTRAVENOUS

## 2017-11-03 MED ORDER — ATROPINE SULFATE 1 MG/ML IJ SOLN
INTRAMUSCULAR | Status: AC
  Filled 2017-11-03: qty 1

## 2017-11-03 MED ORDER — ATROPINE SULFATE 1 MG/ML IJ SOLN
0.5000 mg | Freq: Once | INTRAMUSCULAR | Status: AC | PRN
  Administered 2017-11-03: 0.5 mg via INTRAVENOUS

## 2017-11-03 NOTE — Progress Notes (Signed)
[place] OFFICE PROGRESS NOTE   Diagnosis: Rectal cancer  INTERVAL HISTORY:   Hunter Walker returns as scheduled.  He completed a cycle of FOLFIRI/panitumumab on 10/13/2017.  No nausea or diarrhea.  He no longer has rectal pain.  He is beginning to develop a skin rash.  Objective:  Vital signs in last 24 hours:  Blood pressure 131/85, pulse 61, temperature 97.8 F (36.6 C), temperature source Oral, resp. rate 17, height 6' (1.829 m), weight 189 lb 4.8 oz (85.9 kg), SpO2 100 %.    HEENT: No thrush or ulcers Resp: Lungs clear bilaterally Cardio: Regular rate and rhythm GI: No hepatomegaly, no mass Vascular: No leg edema  Skin: Hyperpigmentation and dryness over the trunk.  No rash.  Portacath/PICC-without erythema  Lab Results:  Lab Results  Component Value Date   WBC 3.8 (L) 10/13/2017   HGB 13.3 10/13/2017   HCT 39.5 10/13/2017   MCV 106.5 (H) 10/13/2017   PLT 190 10/13/2017   NEUTROABS 2.1 10/13/2017    CMP  Lab Results  Component Value Date   NA 140 10/13/2017   K 4.1 10/13/2017   CL 109 10/13/2017   CO2 26 10/13/2017   GLUCOSE 88 10/13/2017   BUN 13 10/13/2017   CREATININE 1.11 10/13/2017   CALCIUM 9.5 10/13/2017   PROT 7.3 10/13/2017   ALBUMIN 3.8 10/13/2017   AST 22 10/13/2017   ALT 18 10/13/2017   ALKPHOS 96 10/13/2017   BILITOT 0.7 10/13/2017   GFRNONAA >60 10/13/2017   GFRAA >60 10/13/2017    Lab Results  Component Value Date   CEA1 5.24 (H) 10/13/2017     Medications: I have reviewed the patient's current medications.   Assessment/Plan:  1. Rectal cancer, invasive adenocarcinoma confirmed at colonoscopy 05/22/2015, EUS staging 06/01/2015 revealed a uT3,N1 lesion at 7 cm from the anal verge  Staging CT abdomen/pelvis 05/15/2015 confirmed a mass at the rectosigmoid colon, a suspicious perirectal lymph node, and a 6 mm right liver lesion felt to most likely be a cyst  CT chest 05/30/2015 with indeterminate pulmonary  nodules and multiple liver lesions suspicious for metastases  Ultrasound 06/13/2014 with no liver lesions identified  MRI abdomen 06/21/2015 consistent with multiple liver metastases  CT biopsy of liver lesion 06/26/2015. Pathology showed metastatic adenocarcinoma consistent with colonic primary.MSI-stable, low mutation burden, no RAS or BRAFmutation  Cycle 1 FOLFOX 07/10/2015  Cycle 2 FOLFOX plus Avastin 07/24/2015 with Neulasta support  Cycle 3 FOLFOX plus Avastin 08/07/2015  Cycle 4 FOLFOX plus Avastin 08/21/2015  cycle 5 FOLFOX plus Avastin 09/04/2015  Restaging CTs 09/15/2015 revealed a decrease in the size of liver lesions, decreased rectal primary, stable/decreased size of tiny lung nodules  Cycle 6 FOLFOX plus Avastin 09/18/2015  Cycle 7 FOLFOX plus Avastin 10/02/2015  Cycle 8 FOLFOX plus Avastin 10/16/2015  Cycle 9 FOLFOX plus Avastin 11/06/2015  Cycle 10 FOLFOX plus Avastin 11/20/2015  CTs 12/07/2015-mild decrease in wall thickening at the rectum, decrease in tiny hepatic metastases, stable lung nodules, no evidence of progressive metastatic disease, new left lower lobe infiltrate  Treatment changed to every three-week Avastin with every other week Xeloda beginning 12/11/2015  Restaging CTs 04/12/2016-interval increase in size of adjacent right upper lobe pulmonary nodules and right lower lobe pulmonary nodule; unchanged 5 mm lesion right hepatic lobe; no additional visualized hepatic metastasis. Grossly unchanged wall thickening of the rectum.  Continuation of Xeloda every other week and every three-week AvastinXeloda placed on hold 05/06/2016 due to hand-foot syndrome  Xeloda resumed 05/28/2016 1500 mg every morning and 1000 mg every afternoon 7 days on/7 days off  CT 09/27/2016-enlargement of lung nodules and new liver lesions, stable rectal mass  Cycle 1 FOLFIRI/panitumumab 10/08/2016  Cycle 2 panitumumab 10/23/2016 (FOLFIRI held secondary to  neutropenia)  Cycle 3 FOLFIRI/PANITUMUMAB 11/04/2016  Cycle 4 FOLFIRI/panitumumab 11/18/2016  Cycle 5 FOLFIRI/panitumumab 12/16/2016  Cycle 6 FOLFIRI/panitumumab 12/31/2016  CTs 01/16/2017-decreased size of pulmonary nodules and liver lesions. Decreased perirectal lymph node  Cycle 7 FOLFIRI/PANITUMUMAB 01/28/2017  Cycle 8FOLFIRI/PANITUMUMAB 02/10/2017  Cycle 9 FOLFIRI/PANITUMUMAB 02/24/2017  Cycle 10 FOLFIRI/panitumumab 03/10/2017 (irinotecan held secondary to neutropenia)  Cycle 11 FOLFIRI/panitumumab 03/24/2017  Cycle12 FOLFIRI/Panitumumab 04/07/2017  Restaging CTs 04/25/2017-stable rectal mass with adjacent 8 mm left perirectal nodal metastasis. Prior hepatic metastases not visualized on the current study. Indeterminate 16 mm enhancing lesion present in segment 6, indeterminate. Minimal residual nodularity in the right upper lobe. No new/progressive pulmonary metastases.  Maintenance Xeloda/panitumumab 04/28/2017  Restaging CTs 08/18/2017-new and enlarging bilateral lung nodules; 2 hyperattenuating lesions in the liver appear similar to prior study; progression of wall thickening in the rectum with enlarging perirectal lymph nodes.  Initiation of radiation/Xeloda 09/01/2017, completed 09/22/2017  Cycle 1 FOLFIRI/Panitumumab 10/13/2017  Cycle 2 FOLFIRI 11/03/2017 2.Multiple colon polyps on the colonoscopy 05/22/2015, tubular villous adenoma and a tubular adenoma were removed 3.Anemia-likely secondary to rectal bleeding.  4.Family history of multiple cancers including breast and prostate cancer 5.Transient right arm numbness 06/20/2015 6. Port-A-Cath placement 07/06/2015 by Dr. Marcello Moores 7. Mild neutropenia secondary to chemotherapy following cycle 1 FOLFOX. Neulasta added with cycle 2. 8. Delayed nausea following chemotherapy-emend added with cycle 3 FOLFOX;Emend added with cycle 12 FOLFIRI. 9. Early oxaliplatin neuropathy 10. Hand-foot syndrome secondary to Xeloda  05/06/2016;improved 05/28/2016. Xeloda resumed at a reduced dose. 11. Bilateral hand weakness. Question related to previous oxaliplatin. Referred to neurology 06/17/2016. 12. Hypertension-amlodipine started 07/08/2016 13. Rash secondary to PANITUMUMAB. He continues minocycline. 14. Neutropenia following cycle 1 FOLFIRI/PANITUMUMAB; neutropenia following cycle 4 FOLFIRI/PANITUMUMAB.   Disposition: Hunter Walker appears stable.  He tolerated the first cycle of salvage FOLFIRI/panitumumab well.  His insurance company will no longer pay for Panitumumab even though he has not progressed on panitumumab when given concurrently with irinotecan.  He will continue FOLFIRI.  He has mild neutropenia today.  He will call for a fever or symptoms of infection.  We will try to get insurance company approval for Neulasta.  Hunter Walker will return for an office visit and chemotherapy in 2 weeks.  25 minutes were spent with the patient today.  The majority of the time was used for counseling and coordination of care.  Betsy Coder, MD  11/03/2017  11:13 AM

## 2017-11-03 NOTE — Progress Notes (Signed)
Pt saw Dr. Benay Spice prior to chemo today.   OK to treat with ANC  1.3 as per Dr. Benay Spice.

## 2017-11-03 NOTE — Telephone Encounter (Signed)
Appointment scheduled AVS/Calendar printed per 6/3 los

## 2017-11-03 NOTE — Patient Instructions (Signed)
[facility] Discharge Instructions for Patients Receiving Chemotherapy  Today you received the following chemotherapy agents :  Irinotecan, Leucovorin, Fluorouracil.  To help prevent nausea and vomiting after your treatment, we encourage you to take your nausea medication as prescribed.   If you develop nausea and vomiting that is not controlled by your nausea medication, call the clinic.   BELOW ARE SYMPTOMS THAT SHOULD BE REPORTED IMMEDIATELY:  *FEVER GREATER THAN 100.5 F  *CHILLS WITH OR WITHOUT FEVER  NAUSEA AND VOMITING THAT IS NOT CONTROLLED WITH YOUR NAUSEA MEDICATION  *UNUSUAL SHORTNESS OF BREATH  *UNUSUAL BRUISING OR BLEEDING  TENDERNESS IN MOUTH AND THROAT WITH OR WITHOUT PRESENCE OF ULCERS  *URINARY PROBLEMS  *BOWEL PROBLEMS  UNUSUAL RASH Items with * indicate a potential emergency and should be followed up as soon as possible.  Feel free to call the clinic should you have any questions or concerns. The clinic phone number is [phone].  Please show the CHEMO ALERT CARD at check-in to the Emergency Department and triage nurse.   

## 2017-11-05 ENCOUNTER — Inpatient Hospital Stay: Payer: Medicare HMO

## 2017-11-05 VITALS — BP 134/82 | HR 71 | Temp 98.7°F | Resp 17

## 2017-11-05 DIAGNOSIS — C787 Secondary malignant neoplasm of liver and intrahepatic bile duct: Secondary | ICD-10-CM | POA: Diagnosis not present

## 2017-11-05 DIAGNOSIS — Z5111 Encounter for antineoplastic chemotherapy: Secondary | ICD-10-CM | POA: Diagnosis not present

## 2017-11-05 DIAGNOSIS — C20 Malignant neoplasm of rectum: Secondary | ICD-10-CM

## 2017-11-05 DIAGNOSIS — G620 Drug-induced polyneuropathy: Secondary | ICD-10-CM | POA: Diagnosis not present

## 2017-11-05 DIAGNOSIS — Z79899 Other long term (current) drug therapy: Secondary | ICD-10-CM | POA: Diagnosis not present

## 2017-11-05 DIAGNOSIS — Z5189 Encounter for other specified aftercare: Secondary | ICD-10-CM | POA: Diagnosis not present

## 2017-11-05 DIAGNOSIS — D701 Agranulocytosis secondary to cancer chemotherapy: Secondary | ICD-10-CM | POA: Diagnosis not present

## 2017-11-05 DIAGNOSIS — Z923 Personal history of irradiation: Secondary | ICD-10-CM | POA: Diagnosis not present

## 2017-11-05 DIAGNOSIS — L270 Generalized skin eruption due to drugs and medicaments taken internally: Secondary | ICD-10-CM | POA: Diagnosis not present

## 2017-11-05 MED ORDER — HEPARIN SOD (PORK) LOCK FLUSH 100 UNIT/ML IV SOLN
500.0000 [IU] | Freq: Once | INTRAVENOUS | Status: AC | PRN
  Administered 2017-11-05: 500 [IU]
  Filled 2017-11-05: qty 5

## 2017-11-05 MED ORDER — PEGFILGRASTIM-CBQV 6 MG/0.6ML ~~LOC~~ SOSY
6.0000 mg | PREFILLED_SYRINGE | Freq: Once | SUBCUTANEOUS | Status: AC
  Administered 2017-11-05: 6 mg via SUBCUTANEOUS

## 2017-11-05 MED ORDER — SODIUM CHLORIDE 0.9% FLUSH
10.0000 mL | INTRAVENOUS | Status: DC | PRN
  Administered 2017-11-05: 10 mL
  Filled 2017-11-05: qty 10

## 2017-11-10 ENCOUNTER — Ambulatory Visit: Payer: Medicare HMO

## 2017-11-10 ENCOUNTER — Ambulatory Visit: Payer: Medicare HMO | Admitting: Oncology

## 2017-11-10 ENCOUNTER — Other Ambulatory Visit: Payer: Medicare HMO

## 2017-11-12 ENCOUNTER — Encounter: Payer: Self-pay | Admitting: Radiation Oncology

## 2017-11-12 ENCOUNTER — Other Ambulatory Visit: Payer: Self-pay

## 2017-11-12 ENCOUNTER — Ambulatory Visit
Admission: RE | Admit: 2017-11-12 | Discharge: 2017-11-12 | Disposition: A | Discharge status: Home / Self Care | Payer: Medicare HMO | Source: Ambulatory Visit | Attending: Radiation Oncology | Admitting: Radiation Oncology

## 2017-11-12 VITALS — BP 115/83 | HR 63 | Temp 99.1°F | Resp 18 | Ht <= 58 in | Wt 185.6 lb

## 2017-11-12 DIAGNOSIS — Z79899 Other long term (current) drug therapy: Secondary | ICD-10-CM | POA: Insufficient documentation

## 2017-11-12 DIAGNOSIS — K6289 Other specified diseases of anus and rectum: Secondary | ICD-10-CM | POA: Diagnosis not present

## 2017-11-12 DIAGNOSIS — R197 Diarrhea, unspecified: Secondary | ICD-10-CM | POA: Diagnosis not present

## 2017-11-12 DIAGNOSIS — C20 Malignant neoplasm of rectum: Secondary | ICD-10-CM | POA: Diagnosis not present

## 2017-11-12 NOTE — Addendum Note (Signed)
Encounter addended by: Malena Edman, RN on: 11/12/2017 11:12 AM  Actions taken: Charge Capture section accepted

## 2017-11-12 NOTE — Progress Notes (Signed)
Radiation Oncology         [phone] ________________________________  Name: Hunter Walker MRN: [id number]  Date of Service: 11/12/2017 DOB: 28-Aug-1950  Post Treatment Note  CC: [name], MD  Ladell Pier, MD  Diagnosis:    Progressive Stage IV adenocarcinoma of the rectum with bleeding and rectal pain   Interval Since Last Radiation:  7 weeks   09/01/2017 - 09/22/2017: Rectum/ 40 Gy delivered in 16 fractions of 2.5 Gy   Narrative:  The patient returns today for routine follow-up.  The patient tolerated radiotherapy well but did have diarrhea toward the end of treatment. He continues on systemic chemotherapy and is due for his next cycle of chemo on 11/17/17.                           On review of systems, the patient states he is doing very well. He reports he has not had any additional episodes of rectal bleeding or pain since completing treatment. He has no other complaints. He does admit to recently drinking coffee and denies symptoms of infection, given his Temp was 99.1 this morning.  ALLERGIES:  has No Known Allergies.  Meds: Current Outpatient Medications  Medication Sig Dispense Refill  . amLODipine (NORVASC) 5 MG tablet Take 1 tablet (5 mg total) by mouth daily. 30 tablet 2  . bisacodyl ([employer] GENTLE LAXATIVE) 5 MG EC tablet Take 1 tablet (5 mg total) by mouth daily as needed (constipation). 30 tablet 5  . ferrous sulfate 325 (65 FE) MG tablet Take 325 mg by mouth daily with breakfast.     . magnesium oxide (MAG-OX) 400 (241.3 Mg) MG tablet Take 1 tablet (400 mg total) by mouth 2 (two) times daily. 60 tablet 0  . pantoprazole (PROTONIX) 40 MG tablet Take 1 tablet (40 mg total) by mouth daily. 30 tablet 3  . polyethylene glycol (MIRALAX / GLYCOLAX) packet Take 17 g by mouth daily.    . prochlorperazine (COMPAZINE) 10 MG tablet TAKE 1 TABLET (10 MG TOTAL) BY MOUTH EVERY 6 (SIX) HOURS AS NEEDED FOR NAUSEA OR VOMITING. 30 tablet 2  . gabapentin (NEURONTIN)  300 MG capsule TAKE 1-2 CAPSULES (300-600 MG TOTAL) BY MOUTH AT BEDTIME. (Patient not taking: Reported on 11/12/2017) 60 capsule 3  . HYDROcodone-acetaminophen (NORCO) 5-325 MG tablet Take 1-2 tablets by mouth every 6 (six) hours as needed for moderate pain. (Patient not taking: Reported on 11/12/2017) 120 tablet 0  . minocycline (MINOCIN) 100 MG capsule Take 1 capsule (100 mg total) by mouth 2 (two) times daily. (Patient not taking: Reported on 11/12/2017) 60 capsule 2  . NON FORMULARY     . NON FORMULARY      No current facility-administered medications for this encounter.    Facility-Administered Medications Ordered in Other Encounters  Medication Dose Route Frequency Provider Last Rate Last Dose  . sodium chloride 0.9 % injection 10 mL  10 mL Intravenous PRN Ladell Pier, MD   10 mL at 12/31/16 0811  . sodium chloride 0.9 % injection 10 mL  10 mL Intravenous PRN Ladell Pier, MD   10 mL at 03/24/17 0848    Physical Findings:  height is 6" (0.152 m) (abnormal) and weight is 185 lb 9.6 oz (84.2 kg). His oral temperature is 99.1 F (37.3 C). His blood pressure is 115/83 and his pulse is 63. His respiration is 18 and oxygen saturation is 100%.  Pain  Assessment Pain Score: 0-No pain/10 In general this is a well appearing African-American male in no acute distress. He's alert and oriented x4 and appropriate throughout the examination. Cardiopulmonary assessment is negative for acute distress and he exhibits normal effort.   Lab Findings: Lab Results  Component Value Date   WBC 2.9 (L) 11/03/2017   HGB 13.8 11/03/2017   HCT 41.2 11/03/2017   MCV 105.4 (H) 11/03/2017   PLT 179 11/03/2017     Radiographic Findings: No results found.  Impression/Plan: 1.  Progressive Stage IV adenocarcinoma of the rectum with bleeding and rectal pain.  The patient appears to be doing very well clinically since completing radiotherapy.  He will continue with systemic therapy under the care of Dr.  Benay Spice, and we will be available as needed moving forward.  He is counseled on some of the long-term considerations given the radiotherapy treatment.  He will contact us if he has any questions or concerns.  Regarding his temperature of 99.1 again just to clarify, he did have coffee prior to coming down to our department to have his vital signs checked.  He is not having any symptoms of infectious concern today, and I encouraged him to follow-up with Dr. Benay Spice if he did have fevers at home or started developing symptoms of infection.    Carola Rhine, PAC

## 2017-11-17 ENCOUNTER — Inpatient Hospital Stay: Payer: Medicare HMO

## 2017-11-17 ENCOUNTER — Inpatient Hospital Stay (HOSPITAL_BASED_OUTPATIENT_CLINIC_OR_DEPARTMENT_OTHER): Payer: Medicare HMO | Admitting: Oncology

## 2017-11-17 ENCOUNTER — Other Ambulatory Visit: Payer: Self-pay | Admitting: Oncology

## 2017-11-17 VITALS — BP 133/88 | HR 75 | Temp 98.8°F | Resp 18 | Ht 72.0 in | Wt 190.9 lb

## 2017-11-17 DIAGNOSIS — G620 Drug-induced polyneuropathy: Secondary | ICD-10-CM | POA: Diagnosis not present

## 2017-11-17 DIAGNOSIS — Z79899 Other long term (current) drug therapy: Secondary | ICD-10-CM

## 2017-11-17 DIAGNOSIS — L270 Generalized skin eruption due to drugs and medicaments taken internally: Secondary | ICD-10-CM | POA: Diagnosis not present

## 2017-11-17 DIAGNOSIS — Z5111 Encounter for antineoplastic chemotherapy: Secondary | ICD-10-CM | POA: Diagnosis not present

## 2017-11-17 DIAGNOSIS — R110 Nausea: Secondary | ICD-10-CM

## 2017-11-17 DIAGNOSIS — C787 Secondary malignant neoplasm of liver and intrahepatic bile duct: Secondary | ICD-10-CM | POA: Diagnosis not present

## 2017-11-17 DIAGNOSIS — C20 Malignant neoplasm of rectum: Secondary | ICD-10-CM

## 2017-11-17 DIAGNOSIS — D701 Agranulocytosis secondary to cancer chemotherapy: Secondary | ICD-10-CM

## 2017-11-17 DIAGNOSIS — Z923 Personal history of irradiation: Secondary | ICD-10-CM

## 2017-11-17 DIAGNOSIS — Z5189 Encounter for other specified aftercare: Secondary | ICD-10-CM | POA: Diagnosis not present

## 2017-11-17 DIAGNOSIS — Z95828 Presence of other vascular implants and grafts: Secondary | ICD-10-CM

## 2017-11-17 LAB — CBC WITH DIFFERENTIAL (CANCER CENTER ONLY)
BASOS ABS: 0 10*3/uL (ref 0.0–0.1)
BASOS PCT: 0 %
EOS ABS: 0.1 10*3/uL (ref 0.0–0.5)
EOS PCT: 2 %
HCT: 39.6 % (ref 38.4–49.9)
Hemoglobin: 13.2 g/dL (ref 13.0–17.1)
Lymphocytes Relative: 13 %
Lymphs Abs: 1 10*3/uL (ref 0.9–3.3)
MCH: 35.1 pg — AB (ref 27.2–33.4)
MCHC: 33.4 g/dL (ref 32.0–36.0)
MCV: 105.4 fL — ABNORMAL HIGH (ref 79.3–98.0)
Monocytes Absolute: 0.6 10*3/uL (ref 0.1–0.9)
Monocytes Relative: 8 %
Neutro Abs: 6.2 10*3/uL (ref 1.5–6.5)
Neutrophils Relative %: 77 %
PLATELETS: 222 10*3/uL (ref 140–400)
RBC: 3.76 MIL/uL — AB (ref 4.20–5.82)
RDW: 15.3 % — ABNORMAL HIGH (ref 11.0–14.6)
WBC: 7.9 10*3/uL (ref 4.0–10.3)

## 2017-11-17 LAB — CMP (CANCER CENTER ONLY)
ALT: 10 U/L (ref 0–55)
AST: 15 U/L (ref 5–34)
Albumin: 3.5 g/dL (ref 3.5–5.0)
Alkaline Phosphatase: 125 U/L (ref 40–150)
Anion gap: 8 (ref 3–11)
BUN: 8 mg/dL (ref 7–26)
CO2: 25 mmol/L (ref 22–29)
CREATININE: 1.32 mg/dL — AB (ref 0.70–1.30)
Calcium: 9.2 mg/dL (ref 8.4–10.4)
Chloride: 106 mmol/L (ref 98–109)
GFR, EST NON AFRICAN AMERICAN: 55 mL/min — AB (ref >60)
GFR, Est AFR Am: >60 mL/min (ref >60)
Glucose, Bld: 101 mg/dL (ref 70–140)
Potassium: 4.3 mmol/L (ref 3.5–5.1)
Sodium: 139 mmol/L (ref 136–145)
Total Bilirubin: 0.3 mg/dL (ref 0.2–1.2)
Total Protein: 7.5 g/dL (ref 6.4–8.3)

## 2017-11-17 LAB — MAGNESIUM: Magnesium: 1.9 mg/dL (ref 1.7–2.4)

## 2017-11-17 MED ORDER — SODIUM CHLORIDE 0.9% FLUSH
10.0000 mL | INTRAVENOUS | Status: DC | PRN
  Filled 2017-11-17: qty 10

## 2017-11-17 MED ORDER — ATROPINE SULFATE 1 MG/ML IJ SOLN
0.5000 mg | Freq: Once | INTRAMUSCULAR | Status: AC | PRN
  Administered 2017-11-17: 0.5 mg via INTRAVENOUS

## 2017-11-17 MED ORDER — SODIUM CHLORIDE 0.9 % IV SOLN
Freq: Once | INTRAVENOUS | Status: AC
  Administered 2017-11-17: 11:00:00 via INTRAVENOUS

## 2017-11-17 MED ORDER — LEUCOVORIN CALCIUM INJECTION 350 MG
300.0000 mg/m2 | Freq: Once | INTRAVENOUS | Status: AC
  Administered 2017-11-17: 622 mg via INTRAVENOUS
  Filled 2017-11-17: qty 31.1

## 2017-11-17 MED ORDER — PALONOSETRON HCL INJECTION 0.25 MG/5ML
0.2500 mg | Freq: Once | INTRAVENOUS | Status: AC
  Administered 2017-11-17: 0.25 mg via INTRAVENOUS

## 2017-11-17 MED ORDER — DEXAMETHASONE SODIUM PHOSPHATE 10 MG/ML IJ SOLN
INTRAMUSCULAR | Status: AC
  Filled 2017-11-17: qty 1

## 2017-11-17 MED ORDER — SODIUM CHLORIDE 0.9 % IJ SOLN
10.0000 mL | INTRAMUSCULAR | Status: DC | PRN
  Administered 2017-11-17: 10 mL via INTRAVENOUS
  Filled 2017-11-17: qty 10

## 2017-11-17 MED ORDER — HEPARIN SOD (PORK) LOCK FLUSH 100 UNIT/ML IV SOLN
500.0000 [IU] | Freq: Once | INTRAVENOUS | Status: DC | PRN
  Filled 2017-11-17: qty 5

## 2017-11-17 MED ORDER — ALTEPLASE 2 MG IJ SOLR
INTRAMUSCULAR | Status: AC
  Filled 2017-11-17: qty 2

## 2017-11-17 MED ORDER — ALTEPLASE 2 MG IJ SOLR
2.0000 mg | Freq: Once | INTRAMUSCULAR | Status: AC | PRN
  Administered 2017-11-17: 2 mg
  Filled 2017-11-17: qty 2

## 2017-11-17 MED ORDER — FLUOROURACIL CHEMO INJECTION 5 GM/100ML
2425.0000 mg/m2 | INTRAVENOUS | Status: DC
  Administered 2017-11-17: 5000 mg via INTRAVENOUS
  Filled 2017-11-17: qty 100

## 2017-11-17 MED ORDER — DEXAMETHASONE SODIUM PHOSPHATE 10 MG/ML IJ SOLN
10.0000 mg | Freq: Once | INTRAMUSCULAR | Status: AC
  Administered 2017-11-17: 10 mg via INTRAVENOUS

## 2017-11-17 MED ORDER — FLUOROURACIL CHEMO INJECTION 2.5 GM/50ML
300.0000 mg/m2 | Freq: Once | INTRAVENOUS | Status: AC
  Administered 2017-11-17: 600 mg via INTRAVENOUS
  Filled 2017-11-17: qty 12

## 2017-11-17 MED ORDER — ATROPINE SULFATE 1 MG/ML IJ SOLN
INTRAMUSCULAR | Status: AC
  Filled 2017-11-17: qty 1

## 2017-11-17 MED ORDER — PALONOSETRON HCL INJECTION 0.25 MG/5ML
INTRAVENOUS | Status: AC
  Filled 2017-11-17: qty 5

## 2017-11-17 MED ORDER — IRINOTECAN HCL CHEMO INJECTION 100 MG/5ML
125.0000 mg/m2 | Freq: Once | INTRAVENOUS | Status: AC
  Administered 2017-11-17: 260 mg via INTRAVENOUS
  Filled 2017-11-17: qty 13

## 2017-11-17 NOTE — Progress Notes (Signed)
[place] OFFICE PROGRESS NOTE   Diagnosis: Rectal cancer  INTERVAL HISTORY:   Hunter Walker returns as scheduled.  He completed another cycle of FOLFIRI 11/03/2017.  No mouth sores or diarrhea.  He has developed a dry rash over the trunk.  No pain.  Good appetite.  Objective:  Vital signs in last 24 hours:  Blood pressure 133/88, pulse 75, temperature 98.8 F (37.1 C), temperature source Oral, resp. rate 18, height 6' (1.829 m), weight 190 lb 14.4 oz (86.6 kg), SpO2 100 %.    HEENT: No thrush or ulcers Resp: Lungs clear bilaterally Cardio: Regular rate and rhythm GI: No hepatomegaly, nontender Vascular: No leg edema  Skin: Diffuse dryness of the trunk  Portacath/PICC-without erythema  Lab Results:  Lab Results  Component Value Date   WBC 2.9 (L) 11/03/2017   HGB 13.8 11/03/2017   HCT 41.2 11/03/2017   MCV 105.4 (H) 11/03/2017   PLT 179 11/03/2017   NEUTROABS 1.3 (L) 11/03/2017    CMP  Lab Results  Component Value Date   NA 140 11/03/2017   K 3.9 11/03/2017   CL 108 11/03/2017   CO2 25 11/03/2017   GLUCOSE 85 11/03/2017   BUN 13 11/03/2017   CREATININE 1.07 11/03/2017   CALCIUM 9.0 11/03/2017   PROT 7.2 11/03/2017   ALBUMIN 3.7 11/03/2017   AST 22 11/03/2017   ALT 17 11/03/2017   ALKPHOS 120 11/03/2017   BILITOT 0.5 11/03/2017   GFRNONAA >60 11/03/2017   GFRAA >60 11/03/2017    Lab Results  Component Value Date   CEA1 5.24 (H) 10/13/2017     Medications: I have reviewed the patient's current medications.   Assessment/Plan: 1. Rectal cancer, invasive adenocarcinoma confirmed at colonoscopy 05/22/2015, EUS staging 06/01/2015 revealed a uT3,N1 lesion at 7 cm from the anal verge  Staging CT abdomen/pelvis 05/15/2015 confirmed a mass at the rectosigmoid colon, a suspicious perirectal lymph node, and a 6 mm right liver lesion felt to most likely be a cyst  CT chest 05/30/2015 with indeterminate pulmonary nodules and multiple liver  lesions suspicious for metastases  Ultrasound 06/13/2014 with no liver lesions identified  MRI abdomen 06/21/2015 consistent with multiple liver metastases  CT biopsy of liver lesion 06/26/2015. Pathology showed metastatic adenocarcinoma consistent with colonic primary.MSI-stable, low mutation burden, no RAS or BRAFmutation  Cycle 1 FOLFOX 07/10/2015  Cycle 2 FOLFOX plus Avastin 07/24/2015 with Neulasta support  Cycle 3 FOLFOX plus Avastin 08/07/2015  Cycle 4 FOLFOX plus Avastin 08/21/2015  cycle 5 FOLFOX plus Avastin 09/04/2015  Restaging CTs 09/15/2015 revealed a decrease in the size of liver lesions, decreased rectal primary, stable/decreased size of tiny lung nodules  Cycle 6 FOLFOX plus Avastin 09/18/2015  Cycle 7 FOLFOX plus Avastin 10/02/2015  Cycle 8 FOLFOX plus Avastin 10/16/2015  Cycle 9 FOLFOX plus Avastin 11/06/2015  Cycle 10 FOLFOX plus Avastin 11/20/2015  CTs 12/07/2015-mild decrease in wall thickening at the rectum, decrease in tiny hepatic metastases, stable lung nodules, no evidence of progressive metastatic disease, new left lower lobe infiltrate  Treatment changed to every three-week Avastin with every other week Xeloda beginning 12/11/2015  Restaging CTs 04/12/2016-interval increase in size of adjacent right upper lobe pulmonary nodules and right lower lobe pulmonary nodule; unchanged 5 mm lesion right hepatic lobe; no additional visualized hepatic metastasis. Grossly unchanged wall thickening of the rectum.  Continuation of Xeloda every other week and every three-week AvastinXeloda placed on hold 05/06/2016 due to hand-foot syndrome  Xeloda resumed 05/28/2016 1500 mg  every morning and 1000 mg every afternoon 7 days on/7 days off  CT 09/27/2016-enlargement of lung nodules and new liver lesions, stable rectal mass  Cycle 1 FOLFIRI/panitumumab 10/08/2016  Cycle 2 panitumumab 10/23/2016 (FOLFIRI held secondary to neutropenia)  Cycle 3  FOLFIRI/PANITUMUMAB 11/04/2016  Cycle 4 FOLFIRI/panitumumab 11/18/2016  Cycle 5 FOLFIRI/panitumumab 12/16/2016  Cycle 6 FOLFIRI/panitumumab 12/31/2016  CTs 01/16/2017-decreased size of pulmonary nodules and liver lesions. Decreased perirectal lymph node  Cycle 7 FOLFIRI/PANITUMUMAB 01/28/2017  Cycle 8FOLFIRI/PANITUMUMAB 02/10/2017  Cycle 9 FOLFIRI/PANITUMUMAB 02/24/2017  Cycle 10 FOLFIRI/panitumumab 03/10/2017 (irinotecan held secondary to neutropenia)  Cycle 11 FOLFIRI/panitumumab 03/24/2017  Cycle12 FOLFIRI/Panitumumab 04/07/2017  Restaging CTs 04/25/2017-stable rectal mass with adjacent 8 mm left perirectal nodal metastasis. Prior hepatic metastases not visualized on the current study. Indeterminate 16 mm enhancing lesion present in segment 6, indeterminate. Minimal residual nodularity in the right upper lobe. No new/progressive pulmonary metastases.  Maintenance Xeloda/panitumumab 04/28/2017  Restaging CTs 08/18/2017-new and enlarging bilateral lung nodules; 2 hyperattenuating lesions in the liver appear similar to prior study; progression of wall thickening in the rectum with enlarging perirectal lymph nodes.  Initiation of radiation/Xeloda 09/01/2017, completed 09/22/2017  Cycle 1 FOLFIRI/Panitumumab 10/13/2017  Cycle 2 FOLFIRI 11/03/2017 2.Multiple colon polyps on the colonoscopy 05/22/2015, tubular villous adenoma and a tubular adenoma were removed 3.Anemia-likely secondary to rectal bleeding.  4.Family history of multiple cancers including breast and prostate cancer 5.Transient right arm numbness 06/20/2015 6. Port-A-Cath placement 07/06/2015 by Dr. Marcello Moores 7. Mild neutropenia secondary to chemotherapy following cycle 1 FOLFOX. Neulasta added with cycle 2. 8. Delayed nausea following chemotherapy-emend added with cycle 3 FOLFOX;Emend added with cycle 12 FOLFIRI. 9. Early oxaliplatin neuropathy 10. Hand-foot syndrome secondary to Xeloda 05/06/2016;improved  05/28/2016. Xeloda resumed at a reduced dose. 11. Bilateral hand weakness. Question related to previous oxaliplatin. Referred to neurology 06/17/2016. 12. Hypertension-amlodipine started 07/08/2016 13. Rash secondary to PANITUMUMAB. He continues minocycline. 14. Neutropenia following cycle 1 FOLFIRI/PANITUMUMAB; neutropenia following cycle 4 FOLFIRI/PANITUMUMAB.    Disposition: Mr. Huge appears stable.  He will complete another cycle of FOLFIRI today.  He will return for an office visit and chemotherapy in 2 weeks.  The plan is to schedule a restaging CT after 5 cycles of FOLFIRI.  15 minutes were spent with the patient today.  The majority of the time was used for counseling and coordination of care.  Betsy Coder, MD  11/17/2017  10:13 AM

## 2017-11-17 NOTE — Patient Instructions (Signed)
Implanted Port Home Guide An implanted port is a type of central line that is placed under the skin. Central lines are used to provide IV access when treatment or nutrition needs to be given through a person's veins. Implanted ports are used for long-term IV access. An implanted port may be placed because:  You need IV medicine that would be irritating to the small veins in your hands or arms.  You need long-term IV medicines, such as antibiotics.  You need IV nutrition for a long period.  You need frequent blood draws for lab tests.  You need dialysis.  Implanted ports are usually placed in the chest area, but they can also be placed in the upper arm, the abdomen, or the leg. An implanted port has two main parts:  Reservoir. The reservoir is round and will appear as a small, raised area under your skin. The reservoir is the part where a needle is inserted to give medicines or draw blood.  Catheter. The catheter is a thin, flexible tube that extends from the reservoir. The catheter is placed into a large vein. Medicine that is inserted into the reservoir goes into the catheter and then into the vein.  How will I care for my incision site? Do not get the incision site wet. Bathe or shower as directed by your health care provider. How is my port accessed? Special steps must be taken to access the port:  Before the port is accessed, a numbing cream can be placed on the skin. This helps numb the skin over the port site.  Your health care provider uses a sterile technique to access the port. ? Your health care provider must put on a mask and sterile gloves. ? The skin over your port is cleaned carefully with an antiseptic and allowed to dry. ? The port is gently pinched between sterile gloves, and a needle is inserted into the port.  Only "non-coring" port needles should be used to access the port. Once the port is accessed, a blood return should be checked. This helps ensure that the port  is in the vein and is not clogged.  If your port needs to remain accessed for a constant infusion, a clear (transparent) bandage will be placed over the needle site. The bandage and needle will need to be changed every week, or as directed by your health care provider.  Keep the bandage covering the needle clean and dry. Do not get it wet. Follow your health care provider's instructions on how to take a shower or bath while the port is accessed.  If your port does not need to stay accessed, no bandage is needed over the port.  What is flushing? Flushing helps keep the port from getting clogged. Follow your health care provider's instructions on how and when to flush the port. Ports are usually flushed with saline solution or a medicine called heparin. The need for flushing will depend on how the port is used.  If the port is used for intermittent medicines or blood draws, the port will need to be flushed: ? After medicines have been given. ? After blood has been drawn. ? As part of routine maintenance.  If a constant infusion is running, the port may not need to be flushed.  How long will my port stay implanted? The port can stay in for as long as your health care provider thinks it is needed. When it is time for the port to come out, surgery will be   done to remove it. The procedure is similar to the one performed when the port was put in. When should I seek immediate medical care? When you have an implanted port, you should seek immediate medical care if:  You notice a bad smell coming from the incision site.  You have swelling, redness, or drainage at the incision site.  You have more swelling or pain at the port site or the surrounding area.  You have a fever that is not controlled with medicine.  This information is not intended to replace advice given to you by your health care provider. Make sure you discuss any questions you have with your health care provider. Document  Released: 05/20/2005 Document Revised: 10/26/2015 Document Reviewed: 01/25/2013 Elsevier Interactive Patient Education  2017 [employer]  

## 2017-11-17 NOTE — Progress Notes (Signed)
No blood return from port for labs.  Assessed by Hunter Walker and Hunter Codding RN.  Cathflo given.  Pt refused to have labs drawn peripherally.  Report given to [place] for MD [place] visit.

## 2017-11-17 NOTE — Patient Instructions (Addendum)
[facility] Discharge Instructions for Patients Receiving Chemotherapy  Today you received the following chemotherapy agents Irinotecan,leucovorin,Adrucil  To help prevent nausea and vomiting after your treatment, we encourage you to take your nausea medication as directed   If you develop nausea and vomiting that is not controlled by your nausea medication, call the clinic.   BELOW ARE SYMPTOMS THAT SHOULD BE REPORTED IMMEDIATELY:  *FEVER GREATER THAN 100.5 F  *CHILLS WITH OR WITHOUT FEVER  NAUSEA AND VOMITING THAT IS NOT CONTROLLED WITH YOUR NAUSEA MEDICATION  *UNUSUAL SHORTNESS OF BREATH  *UNUSUAL BRUISING OR BLEEDING  TENDERNESS IN MOUTH AND THROAT WITH OR WITHOUT PRESENCE OF ULCERS  *URINARY PROBLEMS  *BOWEL PROBLEMS  UNUSUAL RASH Items with * indicate a potential emergency and should be followed up as soon as possible.  Feel free to call the clinic should you have any questions or concerns. The clinic phone number is [phone].  Please show the CHEMO ALERT CARD at check-in to the Emergency Department and triage nurse.   

## 2017-11-19 ENCOUNTER — Inpatient Hospital Stay: Payer: Medicare HMO

## 2017-11-19 VITALS — BP 128/80 | HR 76 | Temp 98.1°F | Resp 18

## 2017-11-19 DIAGNOSIS — Z923 Personal history of irradiation: Secondary | ICD-10-CM | POA: Diagnosis not present

## 2017-11-19 DIAGNOSIS — Z79899 Other long term (current) drug therapy: Secondary | ICD-10-CM | POA: Diagnosis not present

## 2017-11-19 DIAGNOSIS — D701 Agranulocytosis secondary to cancer chemotherapy: Secondary | ICD-10-CM | POA: Diagnosis not present

## 2017-11-19 DIAGNOSIS — L270 Generalized skin eruption due to drugs and medicaments taken internally: Secondary | ICD-10-CM | POA: Diagnosis not present

## 2017-11-19 DIAGNOSIS — C20 Malignant neoplasm of rectum: Secondary | ICD-10-CM

## 2017-11-19 DIAGNOSIS — C787 Secondary malignant neoplasm of liver and intrahepatic bile duct: Secondary | ICD-10-CM | POA: Diagnosis not present

## 2017-11-19 DIAGNOSIS — Z5111 Encounter for antineoplastic chemotherapy: Secondary | ICD-10-CM | POA: Diagnosis not present

## 2017-11-19 DIAGNOSIS — Z5189 Encounter for other specified aftercare: Secondary | ICD-10-CM | POA: Diagnosis not present

## 2017-11-19 DIAGNOSIS — G620 Drug-induced polyneuropathy: Secondary | ICD-10-CM | POA: Diagnosis not present

## 2017-11-19 MED ORDER — PEGFILGRASTIM-CBQV 6 MG/0.6ML ~~LOC~~ SOSY
6.0000 mg | PREFILLED_SYRINGE | Freq: Once | SUBCUTANEOUS | Status: AC
  Administered 2017-11-19: 6 mg via SUBCUTANEOUS

## 2017-11-19 MED ORDER — HEPARIN SOD (PORK) LOCK FLUSH 100 UNIT/ML IV SOLN
500.0000 [IU] | Freq: Once | INTRAVENOUS | Status: AC | PRN
  Administered 2017-11-19: 500 [IU]
  Filled 2017-11-19: qty 5

## 2017-11-19 MED ORDER — SODIUM CHLORIDE 0.9% FLUSH
10.0000 mL | INTRAVENOUS | Status: DC | PRN
  Administered 2017-11-19: 10 mL
  Filled 2017-11-19: qty 10

## 2017-11-19 MED ORDER — PEGFILGRASTIM-CBQV 6 MG/0.6ML ~~LOC~~ SOSY
PREFILLED_SYRINGE | SUBCUTANEOUS | Status: AC
  Filled 2017-11-19: qty 0.6

## 2017-11-24 ENCOUNTER — Other Ambulatory Visit: Payer: Medicare HMO

## 2017-11-24 ENCOUNTER — Ambulatory Visit: Payer: Medicare HMO

## 2017-11-24 ENCOUNTER — Ambulatory Visit: Payer: Medicare HMO | Admitting: Oncology

## 2017-11-30 ENCOUNTER — Other Ambulatory Visit: Payer: Self-pay | Admitting: Oncology

## 2017-12-01 ENCOUNTER — Telehealth: Payer: Self-pay | Admitting: Oncology

## 2017-12-01 ENCOUNTER — Inpatient Hospital Stay (HOSPITAL_BASED_OUTPATIENT_CLINIC_OR_DEPARTMENT_OTHER): Payer: Medicare HMO | Admitting: Oncology

## 2017-12-01 ENCOUNTER — Inpatient Hospital Stay: Payer: Medicare HMO

## 2017-12-01 ENCOUNTER — Inpatient Hospital Stay: Payer: Medicare HMO | Attending: Nurse Practitioner

## 2017-12-01 VITALS — BP 139/85 | HR 61 | Temp 98.3°F | Resp 18 | Ht 72.0 in | Wt 188.5 lb

## 2017-12-01 DIAGNOSIS — C20 Malignant neoplasm of rectum: Secondary | ICD-10-CM

## 2017-12-01 DIAGNOSIS — R530 Neoplastic (malignant) related fatigue: Secondary | ICD-10-CM | POA: Diagnosis not present

## 2017-12-01 DIAGNOSIS — C787 Secondary malignant neoplasm of liver and intrahepatic bile duct: Secondary | ICD-10-CM

## 2017-12-01 DIAGNOSIS — Z452 Encounter for adjustment and management of vascular access device: Secondary | ICD-10-CM | POA: Diagnosis not present

## 2017-12-01 DIAGNOSIS — G620 Drug-induced polyneuropathy: Secondary | ICD-10-CM | POA: Insufficient documentation

## 2017-12-01 DIAGNOSIS — Z923 Personal history of irradiation: Secondary | ICD-10-CM | POA: Insufficient documentation

## 2017-12-01 DIAGNOSIS — Z5111 Encounter for antineoplastic chemotherapy: Secondary | ICD-10-CM | POA: Insufficient documentation

## 2017-12-01 DIAGNOSIS — Z5189 Encounter for other specified aftercare: Secondary | ICD-10-CM | POA: Insufficient documentation

## 2017-12-01 DIAGNOSIS — Z95828 Presence of other vascular implants and grafts: Secondary | ICD-10-CM

## 2017-12-01 LAB — CMP (CANCER CENTER ONLY)
ALBUMIN: 3.5 g/dL (ref 3.5–5.0)
ALK PHOS: 143 U/L — AB (ref 38–126)
ALT: 14 U/L (ref 0–44)
AST: 16 U/L (ref 15–41)
Anion gap: 7 (ref 5–15)
BUN: 12 mg/dL (ref 8–23)
CHLORIDE: 107 mmol/L (ref 98–111)
CO2: 26 mmol/L (ref 22–32)
Calcium: 9 mg/dL (ref 8.9–10.3)
Creatinine: 1.33 mg/dL — ABNORMAL HIGH (ref 0.61–1.24)
GFR, Est AFR Am: >60 mL/min (ref >60)
GFR, Estimated: 55 mL/min — ABNORMAL LOW (ref >60)
GLUCOSE: 104 mg/dL — AB (ref 70–99)
POTASSIUM: 3.8 mmol/L (ref 3.5–5.1)
SODIUM: 140 mmol/L (ref 135–145)
Total Bilirubin: 0.3 mg/dL (ref 0.3–1.2)
Total Protein: 7.2 g/dL (ref 6.5–8.1)

## 2017-12-01 LAB — CBC WITH DIFFERENTIAL (CANCER CENTER ONLY)
BASOS PCT: 1 %
Basophils Absolute: 0 10*3/uL (ref 0.0–0.1)
EOS ABS: 0.1 10*3/uL (ref 0.0–0.5)
Eosinophils Relative: 1 %
HCT: 39.7 % (ref 38.4–49.9)
HEMOGLOBIN: 13.1 g/dL (ref 13.0–17.1)
Lymphocytes Relative: 17 %
Lymphs Abs: 1.2 10*3/uL (ref 0.9–3.3)
MCH: 34.6 pg — ABNORMAL HIGH (ref 27.2–33.4)
MCHC: 33.1 g/dL (ref 32.0–36.0)
MCV: 104.6 fL — ABNORMAL HIGH (ref 79.3–98.0)
MONOS PCT: 9 %
Monocytes Absolute: 0.6 10*3/uL (ref 0.1–0.9)
Neutro Abs: 5.1 10*3/uL (ref 1.5–6.5)
Neutrophils Relative %: 72 %
PLATELETS: 213 10*3/uL (ref 140–400)
RBC: 3.79 MIL/uL — ABNORMAL LOW (ref 4.20–5.82)
RDW: 15.7 % — AB (ref 11.0–14.6)
WBC Count: 7.1 10*3/uL (ref 4.0–10.3)

## 2017-12-01 LAB — MAGNESIUM: Magnesium: 1.9 mg/dL (ref 1.7–2.4)

## 2017-12-01 MED ORDER — LEUCOVORIN CALCIUM INJECTION 350 MG
300.0000 mg/m2 | Freq: Once | INTRAVENOUS | Status: AC
  Administered 2017-12-01: 622 mg via INTRAVENOUS
  Filled 2017-12-01: qty 31.1

## 2017-12-01 MED ORDER — SODIUM CHLORIDE 0.9% FLUSH
10.0000 mL | INTRAVENOUS | Status: DC | PRN
  Filled 2017-12-01: qty 10

## 2017-12-01 MED ORDER — DEXAMETHASONE SODIUM PHOSPHATE 10 MG/ML IJ SOLN
10.0000 mg | Freq: Once | INTRAMUSCULAR | Status: AC
  Administered 2017-12-01: 10 mg via INTRAVENOUS

## 2017-12-01 MED ORDER — SODIUM CHLORIDE 0.9 % IV SOLN
Freq: Once | INTRAVENOUS | Status: AC
  Administered 2017-12-01: 10:00:00 via INTRAVENOUS

## 2017-12-01 MED ORDER — PALONOSETRON HCL INJECTION 0.25 MG/5ML
INTRAVENOUS | Status: AC
  Filled 2017-12-01: qty 5

## 2017-12-01 MED ORDER — SODIUM CHLORIDE 0.9 % IJ SOLN
10.0000 mL | INTRAMUSCULAR | Status: DC | PRN
  Administered 2017-12-01: 10 mL via INTRAVENOUS
  Filled 2017-12-01: qty 10

## 2017-12-01 MED ORDER — HEPARIN SOD (PORK) LOCK FLUSH 100 UNIT/ML IV SOLN
500.0000 [IU] | Freq: Once | INTRAVENOUS | Status: DC | PRN
  Filled 2017-12-01: qty 5

## 2017-12-01 MED ORDER — PALONOSETRON HCL INJECTION 0.25 MG/5ML
0.2500 mg | Freq: Once | INTRAVENOUS | Status: AC
  Administered 2017-12-01: 0.25 mg via INTRAVENOUS

## 2017-12-01 MED ORDER — FLUOROURACIL CHEMO INJECTION 2.5 GM/50ML
300.0000 mg/m2 | Freq: Once | INTRAVENOUS | Status: AC
  Administered 2017-12-01: 600 mg via INTRAVENOUS
  Filled 2017-12-01: qty 12

## 2017-12-01 MED ORDER — SODIUM CHLORIDE 0.9 % IV SOLN
5000.0000 mg | INTRAVENOUS | Status: DC
  Administered 2017-12-01: 5000 mg via INTRAVENOUS
  Filled 2017-12-01: qty 100

## 2017-12-01 MED ORDER — DEXTROSE 5 % IV SOLN
125.0000 mg/m2 | Freq: Once | INTRAVENOUS | Status: AC
  Administered 2017-12-01: 260 mg via INTRAVENOUS
  Filled 2017-12-01: qty 13

## 2017-12-01 MED ORDER — ATROPINE SULFATE 1 MG/ML IJ SOLN
INTRAMUSCULAR | Status: AC
  Filled 2017-12-01: qty 1

## 2017-12-01 MED ORDER — ATROPINE SULFATE 1 MG/ML IJ SOLN
0.5000 mg | Freq: Once | INTRAMUSCULAR | Status: AC | PRN
  Administered 2017-12-01: 0.5 mg via INTRAVENOUS

## 2017-12-01 MED ORDER — DEXAMETHASONE SODIUM PHOSPHATE 10 MG/ML IJ SOLN
INTRAMUSCULAR | Status: AC
  Filled 2017-12-01: qty 1

## 2017-12-01 NOTE — Progress Notes (Signed)
[place] OFFICE PROGRESS NOTE   Diagnosis: Rectal cancer  INTERVAL HISTORY:   Hunter Walker returns as scheduled.  He completed another cycle of FOLFIRI 11/17/2017.  He received Neulasta on 11/19/2017.  No nausea/vomiting, mouth sores, or diarrhea.  He feels well.  No rectal pain.  Objective:  Vital signs in last 24 hours:  Blood pressure 139/85, pulse 61, temperature 98.3 F (36.8 C), temperature source Oral, resp. rate 18, height 6' (1.829 m), weight 188 lb 8 oz (85.5 kg), SpO2 100 %.    HEENT: No thrush or ulcers Resp: Lungs clear bilaterally Cardio: Regular rate and rhythm GI: No hepatomegaly, nontender Vascular: No leg edema  Skin: Skin thickening at the palms, resolving dry rash over the trunk  Portacath/PICC-without erythema  Lab Results:  Lab Results  Component Value Date   WBC 7.1 12/01/2017   HGB 13.1 12/01/2017   HCT 39.7 12/01/2017   MCV 104.6 (H) 12/01/2017   PLT 213 12/01/2017   NEUTROABS 5.1 12/01/2017    CMP  Lab Results  Component Value Date   NA 140 12/01/2017   K 3.8 12/01/2017   CL 107 12/01/2017   CO2 26 12/01/2017   GLUCOSE 104 (H) 12/01/2017   BUN 12 12/01/2017   CREATININE 1.33 (H) 12/01/2017   CALCIUM 9.0 12/01/2017   PROT 7.2 12/01/2017   ALBUMIN 3.5 12/01/2017   AST 16 12/01/2017   ALT 14 12/01/2017   ALKPHOS 143 (H) 12/01/2017   BILITOT 0.3 12/01/2017   GFRNONAA 55 (L) 12/01/2017   GFRAA >60 12/01/2017    Lab Results  Component Value Date   CEA1 5.24 (H) 10/13/2017     Medications: I have reviewed the patient's current medications.   Assessment/Plan: 1. Rectal cancer, invasive adenocarcinoma confirmed at colonoscopy 05/22/2015, EUS staging 06/01/2015 revealed a uT3,N1 lesion at 7 cm from the anal verge  Staging CT abdomen/pelvis 05/15/2015 confirmed a mass at the rectosigmoid colon, a suspicious perirectal lymph node, and a 6 mm right liver lesion felt to most likely be a cyst  CT chest 05/30/2015  with indeterminate pulmonary nodules and multiple liver lesions suspicious for metastases  Ultrasound 06/13/2014 with no liver lesions identified  MRI abdomen 06/21/2015 consistent with multiple liver metastases  CT biopsy of liver lesion 06/26/2015. Pathology showed metastatic adenocarcinoma consistent with colonic primary.MSI-stable, low mutation burden, no RAS or BRAFmutation  Cycle 1 FOLFOX 07/10/2015  Cycle 2 FOLFOX plus Avastin 07/24/2015 with Neulasta support  Cycle 3 FOLFOX plus Avastin 08/07/2015  Cycle 4 FOLFOX plus Avastin 08/21/2015  cycle 5 FOLFOX plus Avastin 09/04/2015  Restaging CTs 09/15/2015 revealed a decrease in the size of liver lesions, decreased rectal primary, stable/decreased size of tiny lung nodules  Cycle 6 FOLFOX plus Avastin 09/18/2015  Cycle 7 FOLFOX plus Avastin 10/02/2015  Cycle 8 FOLFOX plus Avastin 10/16/2015  Cycle 9 FOLFOX plus Avastin 11/06/2015  Cycle 10 FOLFOX plus Avastin 11/20/2015  CTs 12/07/2015-mild decrease in wall thickening at the rectum, decrease in tiny hepatic metastases, stable lung nodules, no evidence of progressive metastatic disease, new left lower lobe infiltrate  Treatment changed to every three-week Avastin with every other week Xeloda beginning 12/11/2015  Restaging CTs 04/12/2016-interval increase in size of adjacent right upper lobe pulmonary nodules and right lower lobe pulmonary nodule; unchanged 5 mm lesion right hepatic lobe; no additional visualized hepatic metastasis. Grossly unchanged wall thickening of the rectum.  Continuation of Xeloda every other week and every three-week AvastinXeloda placed on hold 05/06/2016 due to hand-foot  syndrome  Xeloda resumed 05/28/2016 1500 mg every morning and 1000 mg every afternoon 7 days on/7 days off  CT 09/27/2016-enlargement of lung nodules and new liver lesions, stable rectal mass  Cycle 1 FOLFIRI/panitumumab 10/08/2016  Cycle 2 panitumumab 10/23/2016 (FOLFIRI  held secondary to neutropenia)  Cycle 3 FOLFIRI/PANITUMUMAB 11/04/2016  Cycle 4 FOLFIRI/panitumumab 11/18/2016  Cycle 5 FOLFIRI/panitumumab 12/16/2016  Cycle 6 FOLFIRI/panitumumab 12/31/2016  CTs 01/16/2017-decreased size of pulmonary nodules and liver lesions. Decreased perirectal lymph node  Cycle 7 FOLFIRI/PANITUMUMAB 01/28/2017  Cycle 8FOLFIRI/PANITUMUMAB 02/10/2017  Cycle 9 FOLFIRI/PANITUMUMAB 02/24/2017  Cycle 10 FOLFIRI/panitumumab 03/10/2017 (irinotecan held secondary to neutropenia)  Cycle 11 FOLFIRI/panitumumab 03/24/2017  Cycle12 FOLFIRI/Panitumumab 04/07/2017  Restaging CTs 04/25/2017-stable rectal mass with adjacent 8 mm left perirectal nodal metastasis. Prior hepatic metastases not visualized on the current study. Indeterminate 16 mm enhancing lesion present in segment 6, indeterminate. Minimal residual nodularity in the right upper lobe. No new/progressive pulmonary metastases.  Maintenance Xeloda/panitumumab 04/28/2017  Restaging CTs 08/18/2017-new and enlarging bilateral lung nodules; 2 hyperattenuating lesions in the liver appear similar to prior study; progression of wall thickening in the rectum with enlarging perirectal lymph nodes.  Initiation of radiation/Xeloda 09/01/2017, completed 09/22/2017  Cycle 1 FOLFIRI/Panitumumab 10/13/2017  Cycle 2 FOLFIRI 11/03/2017  Cycle 3 FOLFIRI 11/17/2017  Cycle 4 FOLFIRI 12/01/2017 2.Multiple colon polyps on the colonoscopy 05/22/2015, tubular villous adenoma and a tubular adenoma were removed 3.Anemia-likely secondary to rectal bleeding.  4.Family history of multiple cancers including breast and prostate cancer 5.Transient right arm numbness 06/20/2015 6. Port-A-Cath placement 07/06/2015 by Dr. Marcello Moores 7. Mild neutropenia secondary to chemotherapy following cycle 1 FOLFOX. Neulasta added with cycle 2. 8. Delayed nausea following chemotherapy-emend added with cycle 3 FOLFOX;Emend added with cycle 12 FOLFIRI. 9. Early  oxaliplatin neuropathy 10. 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 Xeloda resumed at a reduced dose. 11. Bilateral hand weakness. Question related to previous oxaliplatin. Referred to neurology 06/17/2016. 12. Hypertension-amlodipine started 07/08/2016 13. Rash secondary to PANITUMUMAB. He continues minocycline. 14. Neutropenia following cycle 1 FOLFIRI/PANITUMUMAB; neutropenia following cycle 4 FOLFIRI/PANITUMUMAB.   Disposition: Hunter Walker appears stable.  He is tolerating the FOLFIRI well.  He will complete another cycle today.  He will return for an office visit and cycle 5 FOLFIRI in 2 weeks.  He will undergo restaging CTs after cycle 5.  15 minutes were spent with the patient today.  The majority of the time was used for counseling and coordination of care.  Betsy Coder, MD  12/01/2017  9:43 AM

## 2017-12-01 NOTE — Telephone Encounter (Signed)
Scheduled appt per 7/1 los - gave patient aVS And calender per los.

## 2017-12-01 NOTE — Patient Instructions (Signed)
[facility] Discharge Instructions for Patients Receiving Chemotherapy  Today you received the following chemotherapy agents :  Irinotecan, Leucovorin, Fluorouracil.  To help prevent nausea and vomiting after your treatment, we encourage you to take your nausea medication as prescribed.   If you develop nausea and vomiting that is not controlled by your nausea medication, call the clinic.   BELOW ARE SYMPTOMS THAT SHOULD BE REPORTED IMMEDIATELY:  *FEVER GREATER THAN 100.5 F  *CHILLS WITH OR WITHOUT FEVER  NAUSEA AND VOMITING THAT IS NOT CONTROLLED WITH YOUR NAUSEA MEDICATION  *UNUSUAL SHORTNESS OF BREATH  *UNUSUAL BRUISING OR BLEEDING  TENDERNESS IN MOUTH AND THROAT WITH OR WITHOUT PRESENCE OF ULCERS  *URINARY PROBLEMS  *BOWEL PROBLEMS  UNUSUAL RASH Items with * indicate a potential emergency and should be followed up as soon as possible.  Feel free to call the clinic should you have any questions or concerns. The clinic phone number is [phone].  Please show the CHEMO ALERT CARD at check-in to the Emergency Department and triage nurse.   

## 2017-12-03 ENCOUNTER — Inpatient Hospital Stay: Payer: Medicare HMO

## 2017-12-03 VITALS — BP 128/78 | HR 68 | Temp 98.3°F | Resp 17

## 2017-12-03 DIAGNOSIS — Z452 Encounter for adjustment and management of vascular access device: Secondary | ICD-10-CM | POA: Diagnosis not present

## 2017-12-03 DIAGNOSIS — C20 Malignant neoplasm of rectum: Secondary | ICD-10-CM | POA: Diagnosis not present

## 2017-12-03 DIAGNOSIS — G620 Drug-induced polyneuropathy: Secondary | ICD-10-CM | POA: Diagnosis not present

## 2017-12-03 DIAGNOSIS — Z5189 Encounter for other specified aftercare: Secondary | ICD-10-CM | POA: Diagnosis not present

## 2017-12-03 DIAGNOSIS — Z923 Personal history of irradiation: Secondary | ICD-10-CM | POA: Diagnosis not present

## 2017-12-03 DIAGNOSIS — R530 Neoplastic (malignant) related fatigue: Secondary | ICD-10-CM | POA: Diagnosis not present

## 2017-12-03 DIAGNOSIS — C787 Secondary malignant neoplasm of liver and intrahepatic bile duct: Secondary | ICD-10-CM | POA: Diagnosis not present

## 2017-12-03 DIAGNOSIS — Z5111 Encounter for antineoplastic chemotherapy: Secondary | ICD-10-CM | POA: Diagnosis not present

## 2017-12-03 MED ORDER — HEPARIN SOD (PORK) LOCK FLUSH 100 UNIT/ML IV SOLN
500.0000 [IU] | Freq: Once | INTRAVENOUS | Status: AC | PRN
  Administered 2017-12-03: 500 [IU]
  Filled 2017-12-03: qty 5

## 2017-12-03 MED ORDER — PEGFILGRASTIM-CBQV 6 MG/0.6ML ~~LOC~~ SOSY
6.0000 mg | PREFILLED_SYRINGE | Freq: Once | SUBCUTANEOUS | Status: AC
  Administered 2017-12-03: 6 mg via SUBCUTANEOUS

## 2017-12-03 MED ORDER — SODIUM CHLORIDE 0.9% FLUSH
10.0000 mL | INTRAVENOUS | Status: DC | PRN
  Administered 2017-12-03: 10 mL
  Filled 2017-12-03: qty 10

## 2017-12-15 ENCOUNTER — Other Ambulatory Visit: Payer: Self-pay | Admitting: Oncology

## 2017-12-15 ENCOUNTER — Inpatient Hospital Stay (HOSPITAL_BASED_OUTPATIENT_CLINIC_OR_DEPARTMENT_OTHER): Payer: Medicare HMO | Admitting: Nurse Practitioner

## 2017-12-15 ENCOUNTER — Inpatient Hospital Stay: Payer: Medicare HMO

## 2017-12-15 ENCOUNTER — Encounter: Payer: Self-pay | Admitting: Nurse Practitioner

## 2017-12-15 ENCOUNTER — Telehealth: Payer: Self-pay | Admitting: Nurse Practitioner

## 2017-12-15 VITALS — BP 140/83 | HR 72 | Temp 97.7°F | Resp 18 | Ht 72.0 in | Wt 189.1 lb

## 2017-12-15 DIAGNOSIS — Z5111 Encounter for antineoplastic chemotherapy: Secondary | ICD-10-CM | POA: Diagnosis not present

## 2017-12-15 DIAGNOSIS — Z452 Encounter for adjustment and management of vascular access device: Secondary | ICD-10-CM | POA: Diagnosis not present

## 2017-12-15 DIAGNOSIS — Z923 Personal history of irradiation: Secondary | ICD-10-CM | POA: Diagnosis not present

## 2017-12-15 DIAGNOSIS — R530 Neoplastic (malignant) related fatigue: Secondary | ICD-10-CM

## 2017-12-15 DIAGNOSIS — C20 Malignant neoplasm of rectum: Secondary | ICD-10-CM

## 2017-12-15 DIAGNOSIS — G620 Drug-induced polyneuropathy: Secondary | ICD-10-CM

## 2017-12-15 DIAGNOSIS — C787 Secondary malignant neoplasm of liver and intrahepatic bile duct: Secondary | ICD-10-CM

## 2017-12-15 DIAGNOSIS — Z5189 Encounter for other specified aftercare: Secondary | ICD-10-CM | POA: Diagnosis not present

## 2017-12-15 DIAGNOSIS — Z95828 Presence of other vascular implants and grafts: Secondary | ICD-10-CM

## 2017-12-15 LAB — CBC WITH DIFFERENTIAL (CANCER CENTER ONLY)
BASOS PCT: 0 %
Basophils Absolute: 0 10*3/uL (ref 0.0–0.1)
EOS ABS: 0.2 10*3/uL (ref 0.0–0.5)
Eosinophils Relative: 2 %
HCT: 39.3 % (ref 38.4–49.9)
HEMOGLOBIN: 13.1 g/dL (ref 13.0–17.1)
LYMPHS ABS: 1.2 10*3/uL (ref 0.9–3.3)
Lymphocytes Relative: 15 %
MCH: 34.7 pg — ABNORMAL HIGH (ref 27.2–33.4)
MCHC: 33.4 g/dL (ref 32.0–36.0)
MCV: 103.8 fL — ABNORMAL HIGH (ref 79.3–98.0)
Monocytes Absolute: 0.9 10*3/uL (ref 0.1–0.9)
Monocytes Relative: 11 %
NEUTROS PCT: 72 %
Neutro Abs: 6 10*3/uL (ref 1.5–6.5)
Platelet Count: 232 10*3/uL (ref 140–400)
RBC: 3.79 MIL/uL — AB (ref 4.20–5.82)
RDW: 16.1 % — ABNORMAL HIGH (ref 11.0–14.6)
WBC: 8.3 10*3/uL (ref 4.0–10.3)

## 2017-12-15 LAB — CMP (CANCER CENTER ONLY)
ALT: 15 U/L (ref 0–44)
ANION GAP: 6 (ref 5–15)
AST: 16 U/L (ref 15–41)
Albumin: 3.6 g/dL (ref 3.5–5.0)
Alkaline Phosphatase: 145 U/L — ABNORMAL HIGH (ref 38–126)
BUN: 12 mg/dL (ref 8–23)
CALCIUM: 9 mg/dL (ref 8.9–10.3)
CHLORIDE: 107 mmol/L (ref 98–111)
CO2: 27 mmol/L (ref 22–32)
Creatinine: 1.28 mg/dL — ABNORMAL HIGH (ref 0.61–1.24)
GFR, EST NON AFRICAN AMERICAN: 57 mL/min — AB (ref >60)
Glucose, Bld: 95 mg/dL (ref 70–99)
Potassium: 4 mmol/L (ref 3.5–5.1)
Sodium: 140 mmol/L (ref 135–145)
Total Bilirubin: 0.3 mg/dL (ref 0.3–1.2)
Total Protein: 7.4 g/dL (ref 6.5–8.1)

## 2017-12-15 LAB — MAGNESIUM: MAGNESIUM: 2.2 mg/dL (ref 1.7–2.4)

## 2017-12-15 LAB — CEA (IN HOUSE-CHCC): CEA (CHCC-In House): 4.49 ng/mL (ref 0.00–5.00)

## 2017-12-15 MED ORDER — PALONOSETRON HCL INJECTION 0.25 MG/5ML
INTRAVENOUS | Status: AC
  Filled 2017-12-15: qty 5

## 2017-12-15 MED ORDER — SODIUM CHLORIDE 0.9 % IJ SOLN
10.0000 mL | INTRAMUSCULAR | Status: DC | PRN
  Administered 2017-12-15: 10 mL via INTRAVENOUS
  Filled 2017-12-15: qty 10

## 2017-12-15 MED ORDER — FLUOROURACIL CHEMO INJECTION 2.5 GM/50ML
300.0000 mg/m2 | Freq: Once | INTRAVENOUS | Status: AC
  Administered 2017-12-15: 600 mg via INTRAVENOUS
  Filled 2017-12-15: qty 12

## 2017-12-15 MED ORDER — DEXAMETHASONE SODIUM PHOSPHATE 10 MG/ML IJ SOLN
10.0000 mg | Freq: Once | INTRAMUSCULAR | Status: AC
  Administered 2017-12-15: 10 mg via INTRAVENOUS

## 2017-12-15 MED ORDER — ATROPINE SULFATE 1 MG/ML IJ SOLN
INTRAMUSCULAR | Status: AC
  Filled 2017-12-15: qty 1

## 2017-12-15 MED ORDER — ATROPINE SULFATE 1 MG/ML IJ SOLN
0.5000 mg | Freq: Once | INTRAMUSCULAR | Status: AC | PRN
  Administered 2017-12-15: 0.5 mg via INTRAVENOUS

## 2017-12-15 MED ORDER — SODIUM CHLORIDE 0.9 % IV SOLN
Freq: Once | INTRAVENOUS | Status: AC
  Administered 2017-12-15: 11:00:00 via INTRAVENOUS

## 2017-12-15 MED ORDER — DEXAMETHASONE SODIUM PHOSPHATE 10 MG/ML IJ SOLN
INTRAMUSCULAR | Status: AC
  Filled 2017-12-15: qty 1

## 2017-12-15 MED ORDER — IRINOTECAN HCL CHEMO INJECTION 100 MG/5ML
125.0000 mg/m2 | Freq: Once | INTRAVENOUS | Status: AC
  Administered 2017-12-15: 260 mg via INTRAVENOUS
  Filled 2017-12-15: qty 13

## 2017-12-15 MED ORDER — LEUCOVORIN CALCIUM INJECTION 350 MG
300.0000 mg/m2 | Freq: Once | INTRAVENOUS | Status: AC
  Administered 2017-12-15: 622 mg via INTRAVENOUS
  Filled 2017-12-15: qty 31.1

## 2017-12-15 MED ORDER — PALONOSETRON HCL INJECTION 0.25 MG/5ML
0.2500 mg | Freq: Once | INTRAVENOUS | Status: AC
  Administered 2017-12-15: 0.25 mg via INTRAVENOUS

## 2017-12-15 MED ORDER — SODIUM CHLORIDE 0.9 % IV SOLN
5000.0000 mg | INTRAVENOUS | Status: DC
  Administered 2017-12-15: 5000 mg via INTRAVENOUS
  Filled 2017-12-15: qty 100

## 2017-12-15 NOTE — Progress Notes (Signed)
[name] OFFICE PROGRESS NOTE   Diagnosis: Rectal cancer  INTERVAL HISTORY:   Hunter Walker returns as scheduled.  He completed cycle 4 FOLFIRI 12/01/2017.  He denies nausea/vomiting.  No mouth sores.  No diarrhea.  He denies pain.  No shortness of breath.  No fever or cough.  No bleeding.  He has intermittent neuropathy symptoms in the hands and feet.  He notes some fatigue.  Objective:  Vital signs in last 24 hours:  Blood pressure 140/83, pulse 72, temperature 97.7 F (36.5 C), temperature source Oral, resp. rate 18, height 6' (1.829 m), weight 189 lb 1.6 oz (85.8 kg), SpO2 100 %.    HEENT: No thrush or ulcers. Resp: Lungs clear bilaterally. Cardio: Regular rate and rhythm. GI: Abdomen soft and nontender.  No hepatomegaly. Vascular: No leg edema. Neuro: Alert and oriented. Skin: No rash.  Skin has a mild dry appearance. Port-A-Cath without erythema.   Lab Results:  Lab Results  Component Value Date   WBC 8.3 12/15/2017   HGB 13.1 12/15/2017   HCT 39.3 12/15/2017   MCV 103.8 (H) 12/15/2017   PLT 232 12/15/2017   NEUTROABS 6.0 12/15/2017    Imaging:  No results found.  Medications: I have reviewed the patient's current medications.  Assessment/Plan: 1. Rectal cancer, invasive adenocarcinoma confirmed at colonoscopy 05/22/2015, EUS staging 06/01/2015 revealed a uT3,N1 lesion at 7 cm from the anal verge  Staging CT abdomen/pelvis 05/15/2015 confirmed a mass at the rectosigmoid colon, a suspicious perirectal lymph node, and a 6 mm right liver lesion felt to most likely be a cyst  CT chest 05/30/2015 with indeterminate pulmonary nodules and multiple liver lesions suspicious for metastases  Ultrasound 06/13/2014 with no liver lesions identified  MRI abdomen 06/21/2015 consistent with multiple liver metastases  CT biopsy of liver lesion 06/26/2015. Pathology showed metastatic adenocarcinoma consistent with colonic primary.MSI-stable, low mutation  burden, no RAS or BRAFmutation  Cycle 1 FOLFOX 07/10/2015  Cycle 2 FOLFOX plus Avastin 07/24/2015 with Neulasta support  Cycle 3 FOLFOX plus Avastin 08/07/2015  Cycle 4 FOLFOX plus Avastin 08/21/2015  cycle 5 FOLFOX plus Avastin 09/04/2015  Restaging CTs 09/15/2015 revealed a decrease in the size of liver lesions, decreased rectal primary, stable/decreased size of tiny lung nodules  Cycle 6 FOLFOX plus Avastin 09/18/2015  Cycle 7 FOLFOX plus Avastin 10/02/2015  Cycle 8 FOLFOX plus Avastin 10/16/2015  Cycle 9 FOLFOX plus Avastin 11/06/2015  Cycle 10 FOLFOX plus Avastin 11/20/2015  CTs 12/07/2015-mild decrease in wall thickening at the rectum, decrease in tiny hepatic metastases, stable lung nodules, no evidence of progressive metastatic disease, new left lower lobe infiltrate  Treatment changed to every three-week Avastin with every other week Xeloda beginning 12/11/2015  Restaging CTs 04/12/2016-interval increase in size of adjacent right upper lobe pulmonary nodules and right lower lobe pulmonary nodule; unchanged 5 mm lesion right hepatic lobe; no additional visualized hepatic metastasis. Grossly unchanged wall thickening of the rectum.  Continuation of Xeloda every other week and every three-week AvastinXeloda placed on hold 05/06/2016 due to hand-foot syndrome  Xeloda resumed 05/28/2016 1500 mg every morning and 1000 mg every afternoon 7 days on/7 days off  CT 09/27/2016-enlargement of lung nodules and new liver lesions, stable rectal mass  Cycle 1 FOLFIRI/panitumumab 10/08/2016  Cycle 2 panitumumab 10/23/2016 (FOLFIRI held secondary to neutropenia)  Cycle 3 FOLFIRI/PANITUMUMAB 11/04/2016  Cycle 4 FOLFIRI/panitumumab 11/18/2016  Cycle 5 FOLFIRI/panitumumab 12/16/2016  Cycle 6 FOLFIRI/panitumumab 12/31/2016  CTs 01/16/2017-decreased size of pulmonary nodules and liver lesions. Decreased  perirectal lymph node  Cycle 7 FOLFIRI/PANITUMUMAB 01/28/2017  Cycle  8FOLFIRI/PANITUMUMAB 02/10/2017  Cycle 9 FOLFIRI/PANITUMUMAB 02/24/2017  Cycle 10 FOLFIRI/panitumumab 03/10/2017 (irinotecan held secondary to neutropenia)  Cycle 11 FOLFIRI/panitumumab 03/24/2017  Cycle12 FOLFIRI/Panitumumab 04/07/2017  Restaging CTs 04/25/2017-stable rectal mass with adjacent 8 mm left perirectal nodal metastasis. Prior hepatic metastases not visualized on the current study. Indeterminate 16 mm enhancing lesion present in segment 6, indeterminate. Minimal residual nodularity in the right upper lobe. No new/progressive pulmonary metastases.  Maintenance Xeloda/panitumumab 04/28/2017  Restaging CTs 08/18/2017-new and enlarging bilateral lung nodules; 2 hyperattenuating lesions in the liver appear similar to prior study; progression of wall thickening in the rectum with enlarging perirectal lymph nodes.  Initiation of radiation/Xeloda 09/01/2017, completed 09/22/2017  Cycle 1 FOLFIRI/Panitumumab 10/13/2017  Cycle 2 FOLFIRI 11/03/2017  Cycle 3 FOLFIRI 11/17/2017  Cycle 4 FOLFIRI 12/01/2017  Cycle 5 FOLFIRI 12/15/2017 2.Multiple colon polyps on the colonoscopy 05/22/2015, tubular villous adenoma and a tubular adenoma were removed 3.Anemia-likely secondary to rectal bleeding.  4.Family history of multiple cancers including breast and prostate cancer 5.Transient right arm numbness 06/20/2015 6. Port-A-Cath placement 07/06/2015 by Dr. Marcello Moores 7. Mild neutropenia secondary to chemotherapy following cycle 1 FOLFOX. Neulasta added with cycle 2. 8. Delayed nausea following chemotherapy-emend added with cycle 3 FOLFOX;Emend added with cycle 12 FOLFIRI. 9. Early oxaliplatin neuropathy 10. 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 Xeloda resumed at a reduced dose. 11. Bilateral hand weakness. Question related to previous oxaliplatin. Referred to neurology 06/17/2016. 12. Hypertension-amlodipine started 07/08/2016 13. Rash secondary to PANITUMUMAB.    14. Neutropenia following cycle 1 FOLFIRI/PANITUMUMAB; neutropenia following cycle 4 FOLFIRI/PANITUMUMAB.    Disposition: Hunter Walker appears stable.  He has completed 4 cycles of FOLFIRI.  Plan to proceed with cycle 5 today as scheduled.  He will undergo restaging CT scans 12/26/2017.  We will see him in follow-up on 12/29/2017.  He will contact the office in the interim with any problems.  Plan reviewed with Dr. Benay Spice.    Ned Card ANP/GNP-BC   12/15/2017  10:21 AM

## 2017-12-15 NOTE — Telephone Encounter (Signed)
Gave patient avs and calendar of upcoming July and aug appts.  °

## 2017-12-15 NOTE — Patient Instructions (Addendum)
[facility] Discharge Instructions for Patients Receiving Chemotherapy  Today you received the following chemotherapy agents :  Irinotecan, Leucovorin, Fluorouracil.  To help prevent nausea and vomiting after your treatment, we encourage you to take your nausea medication as prescribed.   If you develop nausea and vomiting that is not controlled by your nausea medication, call the clinic.   BELOW ARE SYMPTOMS THAT SHOULD BE REPORTED IMMEDIATELY:  *FEVER GREATER THAN 100.5 F  *CHILLS WITH OR WITHOUT FEVER  NAUSEA AND VOMITING THAT IS NOT CONTROLLED WITH YOUR NAUSEA MEDICATION  *UNUSUAL SHORTNESS OF BREATH  *UNUSUAL BRUISING OR BLEEDING  TENDERNESS IN MOUTH AND THROAT WITH OR WITHOUT PRESENCE OF ULCERS  *URINARY PROBLEMS  *BOWEL PROBLEMS  UNUSUAL RASH Items with * indicate a potential emergency and should be followed up as soon as possible.  Feel free to call the clinic should you have any questions or concerns. The clinic phone number is [phone].  Please show the CHEMO ALERT CARD at check-in to the Emergency Department and triage nurse.   

## 2017-12-17 ENCOUNTER — Inpatient Hospital Stay: Payer: Medicare HMO

## 2017-12-17 VITALS — BP 121/89 | HR 72 | Temp 98.4°F | Resp 20

## 2017-12-17 DIAGNOSIS — Z5189 Encounter for other specified aftercare: Secondary | ICD-10-CM | POA: Diagnosis not present

## 2017-12-17 DIAGNOSIS — Z5111 Encounter for antineoplastic chemotherapy: Secondary | ICD-10-CM | POA: Diagnosis not present

## 2017-12-17 DIAGNOSIS — Z923 Personal history of irradiation: Secondary | ICD-10-CM | POA: Diagnosis not present

## 2017-12-17 DIAGNOSIS — Z452 Encounter for adjustment and management of vascular access device: Secondary | ICD-10-CM | POA: Diagnosis not present

## 2017-12-17 DIAGNOSIS — C787 Secondary malignant neoplasm of liver and intrahepatic bile duct: Secondary | ICD-10-CM | POA: Diagnosis not present

## 2017-12-17 DIAGNOSIS — G620 Drug-induced polyneuropathy: Secondary | ICD-10-CM | POA: Diagnosis not present

## 2017-12-17 DIAGNOSIS — C20 Malignant neoplasm of rectum: Secondary | ICD-10-CM

## 2017-12-17 DIAGNOSIS — R530 Neoplastic (malignant) related fatigue: Secondary | ICD-10-CM | POA: Diagnosis not present

## 2017-12-17 MED ORDER — PEGFILGRASTIM-CBQV 6 MG/0.6ML ~~LOC~~ SOSY
6.0000 mg | PREFILLED_SYRINGE | Freq: Once | SUBCUTANEOUS | Status: AC
  Administered 2017-12-17: 6 mg via SUBCUTANEOUS

## 2017-12-17 MED ORDER — HEPARIN SOD (PORK) LOCK FLUSH 100 UNIT/ML IV SOLN
500.0000 [IU] | Freq: Once | INTRAVENOUS | Status: AC | PRN
  Administered 2017-12-17: 500 [IU]
  Filled 2017-12-17: qty 5

## 2017-12-17 MED ORDER — SODIUM CHLORIDE 0.9% FLUSH
10.0000 mL | INTRAVENOUS | Status: DC | PRN
  Administered 2017-12-17: 10 mL
  Filled 2017-12-17: qty 10

## 2017-12-17 NOTE — Patient Instructions (Signed)
Pegfilgrastim injection What is this medicine? PEGFILGRASTIM (PEG fil gra stim) is a long-acting granulocyte colony-stimulating factor that stimulates the growth of neutrophils, a type of white blood cell important in the body's fight against infection. It is used to reduce the incidence of fever and infection in patients with certain types of cancer who are receiving chemotherapy that affects the bone marrow, and to increase survival after being exposed to high doses of radiation. This medicine may be used for other purposes; ask your health care provider or pharmacist if you have questions. COMMON BRAND NAME(S): Neulasta What should I tell my health care provider before I take this medicine? They need to know if you have any of these conditions: -kidney disease -latex allergy -ongoing radiation therapy -sickle cell disease -skin reactions to acrylic adhesives (On-Body Injector only) -an unusual or allergic reaction to pegfilgrastim, filgrastim, other medicines, foods, dyes, or preservatives -pregnant or trying to get pregnant -breast-feeding How should I use this medicine? This medicine is for injection under the skin. If you get this medicine at home, you will be taught how to prepare and give the pre-filled syringe or how to use the On-body Injector. Refer to the patient Instructions for Use for detailed instructions. Use exactly as directed. Tell your healthcare provider immediately if you suspect that the On-body Injector may not have performed as intended or if you suspect the use of the On-body Injector resulted in a missed or partial dose. It is important that you put your used needles and syringes in a special sharps container. Do not put them in a trash can. If you do not have a sharps container, call your pharmacist or healthcare provider to get one. Talk to your pediatrician regarding the use of this medicine in children. While this drug may be prescribed for selected conditions,  precautions do apply. Overdosage: If you think you have taken too much of this medicine contact a poison control center or emergency room at once. NOTE: This medicine is only for you. Do not share this medicine with others. What if I miss a dose? It is important not to miss your dose. Call your doctor or health care professional if you miss your dose. If you miss a dose due to an On-body Injector failure or leakage, a new dose should be administered as soon as possible using a single prefilled syringe for manual use. What may interact with this medicine? Interactions have not been studied. Give your health care provider a list of all the medicines, herbs, non-prescription drugs, or dietary supplements you use. Also tell them if you smoke, drink alcohol, or use illegal drugs. Some items may interact with your medicine. This list may not describe all possible interactions. Give your health care provider a list of all the medicines, herbs, non-prescription drugs, or dietary supplements you use. Also tell them if you smoke, drink alcohol, or use illegal drugs. Some items may interact with your medicine. What should I watch for while using this medicine? You may need blood work done while you are taking this medicine. If you are going to need a MRI, CT scan, or other procedure, tell your doctor that you are using this medicine (On-Body Injector only). What side effects may I notice from receiving this medicine? Side effects that you should report to your doctor or health care professional as soon as possible: -allergic reactions like skin rash, itching or hives, swelling of the face, lips, or tongue -dizziness -fever -pain, redness, or irritation at site   where injected -pinpoint red spots on the skin -red or dark-brown urine -shortness of breath or breathing problems -stomach or side pain, or pain at the shoulder -swelling -tiredness -trouble passing urine or change in the amount of urine Side  effects that usually do not require medical attention (report to your doctor or health care professional if they continue or are bothersome): -bone pain -muscle pain This list may not describe all possible side effects. Call your doctor for medical advice about side effects. You may report side effects to FDA at 1-800-FDA-1088. Where should I keep my medicine? Keep out of the reach of children. Store pre-filled syringes in a refrigerator between 2 and 8 degrees C (36 and 46 degrees F). Do not freeze. Keep in carton to protect from light. Throw away this medicine if it is left out of the refrigerator for more than 48 hours. Throw away any unused medicine after the expiration date. NOTE: This sheet is a summary. It may not cover all possible information. If you have questions about this medicine, talk to your doctor, pharmacist, or health care provider.  2018 Elsevier/Gold Standard (2016-05-16 12:58:03)  

## 2017-12-26 ENCOUNTER — Inpatient Hospital Stay: Payer: Medicare HMO

## 2017-12-26 ENCOUNTER — Ambulatory Visit (HOSPITAL_COMMUNITY)
Admission: RE | Admit: 2017-12-26 | Discharge: 2017-12-26 | Disposition: A | Discharge status: Home / Self Care | Payer: Medicare HMO | Source: Ambulatory Visit | Attending: Nurse Practitioner | Admitting: Nurse Practitioner

## 2017-12-26 DIAGNOSIS — C218 Malignant neoplasm of overlapping sites of rectum, anus and anal canal: Secondary | ICD-10-CM | POA: Diagnosis not present

## 2017-12-26 DIAGNOSIS — C787 Secondary malignant neoplasm of liver and intrahepatic bile duct: Secondary | ICD-10-CM | POA: Diagnosis not present

## 2017-12-26 DIAGNOSIS — Z923 Personal history of irradiation: Secondary | ICD-10-CM | POA: Diagnosis not present

## 2017-12-26 DIAGNOSIS — C20 Malignant neoplasm of rectum: Secondary | ICD-10-CM | POA: Diagnosis not present

## 2017-12-26 DIAGNOSIS — N400 Enlarged prostate without lower urinary tract symptoms: Secondary | ICD-10-CM | POA: Diagnosis not present

## 2017-12-26 DIAGNOSIS — Z9221 Personal history of antineoplastic chemotherapy: Secondary | ICD-10-CM | POA: Diagnosis not present

## 2017-12-26 DIAGNOSIS — I700 Atherosclerosis of aorta: Secondary | ICD-10-CM | POA: Insufficient documentation

## 2017-12-26 DIAGNOSIS — C7800 Secondary malignant neoplasm of unspecified lung: Secondary | ICD-10-CM | POA: Insufficient documentation

## 2017-12-26 DIAGNOSIS — Z5189 Encounter for other specified aftercare: Secondary | ICD-10-CM | POA: Diagnosis not present

## 2017-12-26 DIAGNOSIS — Z95828 Presence of other vascular implants and grafts: Secondary | ICD-10-CM

## 2017-12-26 DIAGNOSIS — Z452 Encounter for adjustment and management of vascular access device: Secondary | ICD-10-CM | POA: Diagnosis not present

## 2017-12-26 DIAGNOSIS — Z5111 Encounter for antineoplastic chemotherapy: Secondary | ICD-10-CM | POA: Diagnosis not present

## 2017-12-26 DIAGNOSIS — K760 Fatty (change of) liver, not elsewhere classified: Secondary | ICD-10-CM | POA: Insufficient documentation

## 2017-12-26 DIAGNOSIS — G620 Drug-induced polyneuropathy: Secondary | ICD-10-CM | POA: Diagnosis not present

## 2017-12-26 DIAGNOSIS — R530 Neoplastic (malignant) related fatigue: Secondary | ICD-10-CM | POA: Diagnosis not present

## 2017-12-26 LAB — CMP (CANCER CENTER ONLY)
ALT: 12 U/L (ref 0–44)
AST: 15 U/L (ref 15–41)
Albumin: 3.6 g/dL (ref 3.5–5.0)
Alkaline Phosphatase: 151 U/L — ABNORMAL HIGH (ref 38–126)
Anion gap: 8 (ref 5–15)
BUN: 10 mg/dL (ref 8–23)
CHLORIDE: 107 mmol/L (ref 98–111)
CO2: 25 mmol/L (ref 22–32)
Calcium: 9.1 mg/dL (ref 8.9–10.3)
Creatinine: 1.19 mg/dL (ref 0.61–1.24)
Glucose, Bld: 101 mg/dL — ABNORMAL HIGH (ref 70–99)
POTASSIUM: 4.1 mmol/L (ref 3.5–5.1)
Sodium: 140 mmol/L (ref 135–145)
Total Bilirubin: 0.3 mg/dL (ref 0.3–1.2)
Total Protein: 7.5 g/dL (ref 6.5–8.1)

## 2017-12-26 LAB — CBC WITH DIFFERENTIAL (CANCER CENTER ONLY)
Basophils Absolute: 0 10*3/uL (ref 0.0–0.1)
Basophils Relative: 1 %
EOS ABS: 0.1 10*3/uL (ref 0.0–0.5)
EOS PCT: 2 %
HCT: 38.6 % (ref 38.4–49.9)
Hemoglobin: 12.9 g/dL — ABNORMAL LOW (ref 13.0–17.1)
LYMPHS ABS: 1.2 10*3/uL (ref 0.9–3.3)
LYMPHS PCT: 16 %
MCH: 34.5 pg — AB (ref 27.2–33.4)
MCHC: 33.5 g/dL (ref 32.0–36.0)
MCV: 102.9 fL — AB (ref 79.3–98.0)
MONO ABS: 0.7 10*3/uL (ref 0.1–0.9)
Monocytes Relative: 10 %
Neutro Abs: 5.1 10*3/uL (ref 1.5–6.5)
Neutrophils Relative %: 71 %
PLATELETS: 242 10*3/uL (ref 140–400)
RBC: 3.75 MIL/uL — ABNORMAL LOW (ref 4.20–5.82)
RDW: 16.1 % — AB (ref 11.0–14.6)
WBC Count: 7.1 10*3/uL (ref 4.0–10.3)

## 2017-12-26 MED ORDER — HEPARIN SOD (PORK) LOCK FLUSH 100 UNIT/ML IV SOLN: 500.0000 [IU] | Freq: Once | INTRAVENOUS | Status: DC

## 2017-12-26 MED ORDER — IOPAMIDOL (ISOVUE-300) INJECTION 61%
100.0000 mL | Freq: Once | INTRAVENOUS | Status: AC | PRN
  Administered 2017-12-26: 100 mL via INTRAVENOUS

## 2017-12-26 MED ORDER — IOPAMIDOL (ISOVUE-300) INJECTION 61%
INTRAVENOUS | Status: AC
  Filled 2017-12-26: qty 100

## 2017-12-26 MED ORDER — SODIUM CHLORIDE 0.9 % IJ SOLN
10.0000 mL | INTRAMUSCULAR | Status: DC | PRN
  Administered 2017-12-26: 10 mL via INTRAVENOUS
  Filled 2017-12-26: qty 10

## 2017-12-26 MED ORDER — HEPARIN SOD (PORK) LOCK FLUSH 100 UNIT/ML IV SOLN
INTRAVENOUS | Status: AC
  Administered 2017-12-26: 500 [IU]
  Filled 2017-12-26: qty 5

## 2017-12-29 ENCOUNTER — Encounter: Payer: Self-pay | Admitting: Nurse Practitioner

## 2017-12-29 ENCOUNTER — Other Ambulatory Visit: Payer: Self-pay | Admitting: Oncology

## 2017-12-29 ENCOUNTER — Inpatient Hospital Stay (HOSPITAL_BASED_OUTPATIENT_CLINIC_OR_DEPARTMENT_OTHER): Payer: Medicare HMO | Admitting: Nurse Practitioner

## 2017-12-29 ENCOUNTER — Other Ambulatory Visit: Payer: Medicare HMO

## 2017-12-29 ENCOUNTER — Inpatient Hospital Stay: Payer: Medicare HMO

## 2017-12-29 ENCOUNTER — Telehealth: Payer: Self-pay

## 2017-12-29 VITALS — BP 109/79 | HR 62 | Temp 98.1°F | Resp 18 | Ht 72.0 in | Wt 186.3 lb

## 2017-12-29 DIAGNOSIS — R530 Neoplastic (malignant) related fatigue: Secondary | ICD-10-CM | POA: Diagnosis not present

## 2017-12-29 DIAGNOSIS — C787 Secondary malignant neoplasm of liver and intrahepatic bile duct: Secondary | ICD-10-CM | POA: Diagnosis not present

## 2017-12-29 DIAGNOSIS — Z452 Encounter for adjustment and management of vascular access device: Secondary | ICD-10-CM | POA: Diagnosis not present

## 2017-12-29 DIAGNOSIS — G620 Drug-induced polyneuropathy: Secondary | ICD-10-CM | POA: Diagnosis not present

## 2017-12-29 DIAGNOSIS — C20 Malignant neoplasm of rectum: Secondary | ICD-10-CM

## 2017-12-29 DIAGNOSIS — Z5111 Encounter for antineoplastic chemotherapy: Secondary | ICD-10-CM | POA: Diagnosis not present

## 2017-12-29 DIAGNOSIS — Z5189 Encounter for other specified aftercare: Secondary | ICD-10-CM | POA: Diagnosis not present

## 2017-12-29 DIAGNOSIS — Z923 Personal history of irradiation: Secondary | ICD-10-CM

## 2017-12-29 MED ORDER — LEUCOVORIN CALCIUM INJECTION 350 MG
300.0000 mg/m2 | Freq: Once | INTRAVENOUS | Status: AC
  Administered 2017-12-29: 622 mg via INTRAVENOUS
  Filled 2017-12-29: qty 31.1

## 2017-12-29 MED ORDER — FLUOROURACIL CHEMO INJECTION 2.5 GM/50ML
300.0000 mg/m2 | Freq: Once | INTRAVENOUS | Status: AC
  Administered 2017-12-29: 600 mg via INTRAVENOUS
  Filled 2017-12-29: qty 12

## 2017-12-29 MED ORDER — PALONOSETRON HCL INJECTION 0.25 MG/5ML
INTRAVENOUS | Status: AC
  Filled 2017-12-29: qty 5

## 2017-12-29 MED ORDER — DEXAMETHASONE SODIUM PHOSPHATE 10 MG/ML IJ SOLN
10.0000 mg | Freq: Once | INTRAMUSCULAR | Status: AC
  Administered 2017-12-29: 10 mg via INTRAVENOUS

## 2017-12-29 MED ORDER — ATROPINE SULFATE 1 MG/ML IJ SOLN
0.5000 mg | Freq: Once | INTRAMUSCULAR | Status: AC | PRN
  Administered 2017-12-29: 1 mg via INTRAVENOUS

## 2017-12-29 MED ORDER — DEXAMETHASONE SODIUM PHOSPHATE 10 MG/ML IJ SOLN
INTRAMUSCULAR | Status: AC
Start: 2017-12-29 — End: ?
  Filled 2017-12-29: qty 1

## 2017-12-29 MED ORDER — FLUOROURACIL CHEMO INJECTION 5 GM/100ML
2410.0000 mg/m2 | INTRAVENOUS | Status: DC
  Administered 2017-12-29: 5000 mg via INTRAVENOUS
  Filled 2017-12-29: qty 100

## 2017-12-29 MED ORDER — SODIUM CHLORIDE 0.9 % IV SOLN
Freq: Once | INTRAVENOUS | Status: AC
  Administered 2017-12-29: 11:00:00 via INTRAVENOUS
  Filled 2017-12-29: qty 250

## 2017-12-29 MED ORDER — PALONOSETRON HCL INJECTION 0.25 MG/5ML
0.2500 mg | Freq: Once | INTRAVENOUS | Status: AC
  Administered 2017-12-29: 0.25 mg via INTRAVENOUS

## 2017-12-29 MED ORDER — DEXTROSE 5 % IV SOLN
125.0000 mg/m2 | Freq: Once | INTRAVENOUS | Status: AC
  Administered 2017-12-29: 260 mg via INTRAVENOUS
  Filled 2017-12-29: qty 13

## 2017-12-29 MED ORDER — ATROPINE SULFATE 1 MG/ML IJ SOLN
INTRAMUSCULAR | Status: AC
  Filled 2017-12-29: qty 1

## 2017-12-29 NOTE — Progress Notes (Addendum)
[facility] OFFICE PROGRESS NOTE   Diagnosis: Rectal cancer  INTERVAL HISTORY:   Hunter Walker returns as scheduled.  He completed cycle 5 FOLFIRI 12/15/2017.  He denies nausea/vomiting.  No mouth sores.  No diarrhea.  No hand or foot pain or redness.  He has a good appetite.  He denies pain.  No shortness of breath.  Objective:  Vital signs in last 24 hours:  Blood pressure 109/79, pulse 62, temperature 98.1 F (36.7 C), temperature source Oral, resp. rate 18, height 6' (1.829 m), weight 186 lb 4.8 oz (84.5 kg), SpO2 100 %.    HEENT: No thrush or ulcers. Resp: Lungs clear bilaterally. Cardio: Regular rate and rhythm. GI: Abdomen soft and nontender.  No hepatomegaly. Vascular: No leg edema. Neuro: Alert and oriented. Skin: Skin with mild dryness.  Palms without erythema. Port-A-Cath without erythema.   Lab Results:  Lab Results  Component Value Date   WBC 7.1 12/26/2017   HGB 12.9 (L) 12/26/2017   HCT 38.6 12/26/2017   MCV 102.9 (H) 12/26/2017   PLT 242 12/26/2017   NEUTROABS 5.1 12/26/2017    Imaging:  No results found.  Medications: I have reviewed the patient's current medications.  Assessment/Plan: 1. Rectal cancer, invasive adenocarcinoma confirmed at colonoscopy 05/22/2015, EUS staging 06/01/2015 revealed a uT3,N1 lesion at 7 cm from the anal verge  Staging CT abdomen/pelvis 05/15/2015 confirmed a mass at the rectosigmoid colon, a suspicious perirectal lymph node, and a 6 mm right liver lesion felt to most likely be a cyst  CT chest 05/30/2015 with indeterminate pulmonary nodules and multiple liver lesions suspicious for metastases  Ultrasound 06/13/2014 with no liver lesions identified  MRI abdomen 06/21/2015 consistent with multiple liver metastases  CT biopsy of liver lesion 06/26/2015. Pathology showed metastatic adenocarcinoma consistent with colonic primary.MSI-stable, low mutation burden, no RAS or BRAFmutation  Cycle 1 FOLFOX  07/10/2015  Cycle 2 FOLFOX plus Avastin 07/24/2015 with Neulasta support  Cycle 3 FOLFOX plus Avastin 08/07/2015  Cycle 4 FOLFOX plus Avastin 08/21/2015  cycle 5 FOLFOX plus Avastin 09/04/2015  Restaging CTs 09/15/2015 revealed a decrease in the size of liver lesions, decreased rectal primary, stable/decreased size of tiny lung nodules  Cycle 6 FOLFOX plus Avastin 09/18/2015  Cycle 7 FOLFOX plus Avastin 10/02/2015  Cycle 8 FOLFOX plus Avastin 10/16/2015  Cycle 9 FOLFOX plus Avastin 11/06/2015  Cycle 10 FOLFOX plus Avastin 11/20/2015  CTs 12/07/2015-mild decrease in wall thickening at the rectum, decrease in tiny hepatic metastases, stable lung nodules, no evidence of progressive metastatic disease, new left lower lobe infiltrate  Treatment changed to every three-week Avastin with every other week Xeloda beginning 12/11/2015  Restaging CTs 04/12/2016-interval increase in size of adjacent right upper lobe pulmonary nodules and right lower lobe pulmonary nodule; unchanged 5 mm lesion right hepatic lobe; no additional visualized hepatic metastasis. Grossly unchanged wall thickening of the rectum.  Continuation of Xeloda every other week and every three-week AvastinXeloda placed on hold 05/06/2016 due to hand-foot syndrome  Xeloda resumed 05/28/2016 1500 mg every morning and 1000 mg every afternoon 7 days on/7 days off  CT 09/27/2016-enlargement of lung nodules and new liver lesions, stable rectal mass  Cycle 1 FOLFIRI/panitumumab 10/08/2016  Cycle 2 panitumumab 10/23/2016 (FOLFIRI held secondary to neutropenia)  Cycle 3 FOLFIRI/PANITUMUMAB 11/04/2016  Cycle 4 FOLFIRI/panitumumab 11/18/2016  Cycle 5 FOLFIRI/panitumumab 12/16/2016  Cycle 6 FOLFIRI/panitumumab 12/31/2016  CTs 01/16/2017-decreased size of pulmonary nodules and liver lesions. Decreased perirectal lymph node  Cycle 7 FOLFIRI/PANITUMUMAB 01/28/2017  Cycle  8FOLFIRI/PANITUMUMAB 02/10/2017  Cycle 9  FOLFIRI/PANITUMUMAB 02/24/2017  Cycle 10 FOLFIRI/panitumumab 03/10/2017 (irinotecan held secondary to neutropenia)  Cycle 11 FOLFIRI/panitumumab 03/24/2017  Cycle12 FOLFIRI/Panitumumab 04/07/2017  Restaging CTs 04/25/2017-stable rectal mass with adjacent 8 mm left perirectal nodal metastasis. Prior hepatic metastases not visualized on the current study. Indeterminate 16 mm enhancing lesion present in segment 6, indeterminate. Minimal residual nodularity in the right upper lobe. No new/progressive pulmonary metastases.  Maintenance Xeloda/panitumumab 04/28/2017  Restaging CTs 08/18/2017-new and enlarging bilateral lung nodules; 2 hyperattenuating lesions in the liver appear similar to prior study; progression of wall thickening in the rectum with enlarging perirectal lymph nodes.  Initiation of radiation/Xeloda 09/01/2017, completed 09/22/2017  Cycle 1 FOLFIRI/Panitumumab 10/13/2017  Cycle 2 FOLFIRI 11/03/2017  Cycle 3 FOLFIRI 11/17/2017  Cycle 4 FOLFIRI 12/01/2017  Cycle 5 FOLFIRI 12/15/2017  CTs 12/26/2017- mild progression lungs and liver; significantly improved rectal wall thickening; resolution of perirectal nodal adenopathy. 2.Multiple colon polyps on the colonoscopy 05/22/2015, tubular villous adenoma and a tubular adenoma were removed 3.Anemia-likely secondary to rectal bleeding.  4.Family history of multiple cancers including breast and prostate cancer 5.Transient right arm numbness 06/20/2015 6. Port-A-Cath placement 07/06/2015 by Dr. Marcello Moores 7. Mild neutropenia secondary to chemotherapy following cycle 1 FOLFOX. Neulasta added with cycle 2. 8. Delayed nausea following chemotherapy-emend added with cycle 3 FOLFOX;Emend added with cycle 12 FOLFIRI. 9. Early oxaliplatin neuropathy 10. 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 Xeloda resumed at a reduced dose. 11. Bilateral hand weakness. Question related to previous oxaliplatin. Referred to neurology  06/17/2016. 12. Hypertension-amlodipine started 07/08/2016 13. Rash secondary to PANITUMUMAB.  14. Neutropenia following cycle 1 FOLFIRI/PANITUMUMAB; neutropenia following cycle 4 FOLFIRI/PANITUMUMAB.    Disposition: [name] appears stable.  He has completed 5 cycles of FOLFIRI.  Recent restaging CTs show minimal progression in the lungs and question liver, improved rectal wall thickening/perirectal adenopathy.  Dr. Benay Spice reviewed the CT results/images with Mr. Bostic at today's visit and recommends continuation of FOLFIRI.  Mr. Nichelson agrees with this plan.  Plan to proceed with cycle 6 FOLFIRI today as scheduled.  He will return for lab, follow-up in the next cycle of FOLFIRI in 2 weeks.  He will contact the office in the interim with any problems.  Patient seen with Dr. Benay Spice.  25 minutes were spent face-to-face at today's visit with the majority of that time involved in counseling/coordination of care.    Ned Card ANP/GNP-BC   12/29/2017  9:32 AM  This was a shared visit with Ned Card.  Mr. Dumond appears unchanged.  He is tolerating the FOLFIRI well.  We reviewed the restaging CT images with Mr. Flenner.  There has not been a significant change in the measurable tumor burden other than a decrease in the rectal mass following radiation.  The plan is to continue FOLFIRI.  Julieanne Manson, MD

## 2017-12-29 NOTE — Telephone Encounter (Signed)
Printed avs and calender of upcoming appointment. per 7/29 los

## 2017-12-29 NOTE — Patient Instructions (Signed)
[facility] Discharge Instructions for Patients Receiving Chemotherapy  Today you received the following chemotherapy agents: irinotecan, leucovorin, 5FU  To help prevent nausea and vomiting after your treatment, we encourage you to take your nausea medication as directed.   If you develop nausea and vomiting that is not controlled by your nausea medication, call the clinic.   BELOW ARE SYMPTOMS THAT SHOULD BE REPORTED IMMEDIATELY:  *FEVER GREATER THAN 100.5 F  *CHILLS WITH OR WITHOUT FEVER  NAUSEA AND VOMITING THAT IS NOT CONTROLLED WITH YOUR NAUSEA MEDICATION  *UNUSUAL SHORTNESS OF BREATH  *UNUSUAL BRUISING OR BLEEDING  TENDERNESS IN MOUTH AND THROAT WITH OR WITHOUT PRESENCE OF ULCERS  *URINARY PROBLEMS  *BOWEL PROBLEMS  UNUSUAL RASH Items with * indicate a potential emergency and should be followed up as soon as possible.  Feel free to call the clinic should you have any questions or concerns. The clinic phone number is [phone].  Please show the CHEMO ALERT CARD at check-in to the Emergency Department and triage nurse.   

## 2017-12-31 ENCOUNTER — Inpatient Hospital Stay: Payer: Medicare HMO

## 2017-12-31 VITALS — BP 111/83 | HR 68 | Temp 98.5°F | Resp 18

## 2017-12-31 DIAGNOSIS — C20 Malignant neoplasm of rectum: Secondary | ICD-10-CM

## 2017-12-31 DIAGNOSIS — Z452 Encounter for adjustment and management of vascular access device: Secondary | ICD-10-CM | POA: Diagnosis not present

## 2017-12-31 DIAGNOSIS — G620 Drug-induced polyneuropathy: Secondary | ICD-10-CM | POA: Diagnosis not present

## 2017-12-31 DIAGNOSIS — Z5189 Encounter for other specified aftercare: Secondary | ICD-10-CM | POA: Diagnosis not present

## 2017-12-31 DIAGNOSIS — C787 Secondary malignant neoplasm of liver and intrahepatic bile duct: Secondary | ICD-10-CM | POA: Diagnosis not present

## 2017-12-31 DIAGNOSIS — Z923 Personal history of irradiation: Secondary | ICD-10-CM | POA: Diagnosis not present

## 2017-12-31 DIAGNOSIS — R530 Neoplastic (malignant) related fatigue: Secondary | ICD-10-CM | POA: Diagnosis not present

## 2017-12-31 DIAGNOSIS — Z5111 Encounter for antineoplastic chemotherapy: Secondary | ICD-10-CM | POA: Diagnosis not present

## 2017-12-31 MED ORDER — SODIUM CHLORIDE 0.9% FLUSH
10.0000 mL | INTRAVENOUS | Status: DC | PRN
  Administered 2017-12-31: 10 mL
  Filled 2017-12-31: qty 10

## 2017-12-31 MED ORDER — HEPARIN SOD (PORK) LOCK FLUSH 100 UNIT/ML IV SOLN
500.0000 [IU] | Freq: Once | INTRAVENOUS | Status: AC | PRN
Start: 2017-12-31 — End: 2017-12-31
  Administered 2017-12-31: 500 [IU]
  Filled 2017-12-31: qty 5

## 2017-12-31 MED ORDER — PEGFILGRASTIM-CBQV 6 MG/0.6ML ~~LOC~~ SOSY
6.0000 mg | PREFILLED_SYRINGE | Freq: Once | SUBCUTANEOUS | Status: AC
  Administered 2017-12-31: 6 mg via SUBCUTANEOUS

## 2017-12-31 MED ORDER — PEGFILGRASTIM-CBQV 6 MG/0.6ML ~~LOC~~ SOSY
PREFILLED_SYRINGE | SUBCUTANEOUS | Status: AC
  Filled 2017-12-31: qty 0.6

## 2017-12-31 NOTE — Patient Instructions (Signed)
Pegfilgrastim injection What is this medicine? PEGFILGRASTIM (PEG fil gra stim) is a long-acting granulocyte colony-stimulating factor that stimulates the growth of neutrophils, a type of white blood cell important in the body's fight against infection. It is used to reduce the incidence of fever and infection in patients with certain types of cancer who are receiving chemotherapy that affects the bone marrow, and to increase survival after being exposed to high doses of radiation. This medicine may be used for other purposes; ask your health care provider or pharmacist if you have questions. COMMON BRAND NAME(S): Neulasta What should I tell my health care provider before I take this medicine? They need to know if you have any of these conditions: -kidney disease -latex allergy -ongoing radiation therapy -sickle cell disease -skin reactions to acrylic adhesives (On-Body Injector only) -an unusual or allergic reaction to pegfilgrastim, filgrastim, other medicines, foods, dyes, or preservatives -pregnant or trying to get pregnant -breast-feeding How should I use this medicine? This medicine is for injection under the skin. If you get this medicine at home, you will be taught how to prepare and give the pre-filled syringe or how to use the On-body Injector. Refer to the patient Instructions for Use for detailed instructions. Use exactly as directed. Tell your healthcare provider immediately if you suspect that the On-body Injector may not have performed as intended or if you suspect the use of the On-body Injector resulted in a missed or partial dose. It is important that you put your used needles and syringes in a special sharps container. Do not put them in a trash can. If you do not have a sharps container, call your pharmacist or healthcare provider to get one. Talk to your pediatrician regarding the use of this medicine in children. While this drug may be prescribed for selected conditions,  precautions do apply. Overdosage: If you think you have taken too much of this medicine contact a poison control center or emergency room at once. NOTE: This medicine is only for you. Do not share this medicine with others. What if I miss a dose? It is important not to miss your dose. Call your doctor or health care professional if you miss your dose. If you miss a dose due to an On-body Injector failure or leakage, a new dose should be administered as soon as possible using a single prefilled syringe for manual use. What may interact with this medicine? Interactions have not been studied. Give your health care provider a list of all the medicines, herbs, non-prescription drugs, or dietary supplements you use. Also tell them if you smoke, drink alcohol, or use illegal drugs. Some items may interact with your medicine. This list may not describe all possible interactions. Give your health care provider a list of all the medicines, herbs, non-prescription drugs, or dietary supplements you use. Also tell them if you smoke, drink alcohol, or use illegal drugs. Some items may interact with your medicine. What should I watch for while using this medicine? You may need blood work done while you are taking this medicine. If you are going to need a MRI, CT scan, or other procedure, tell your doctor that you are using this medicine (On-Body Injector only). What side effects may I notice from receiving this medicine? Side effects that you should report to your doctor or health care professional as soon as possible: -allergic reactions like skin rash, itching or hives, swelling of the face, lips, or tongue -dizziness -fever -pain, redness, or irritation at site   where injected -pinpoint red spots on the skin -red or dark-brown urine -shortness of breath or breathing problems -stomach or side pain, or pain at the shoulder -swelling -tiredness -trouble passing urine or change in the amount of urine Side  effects that usually do not require medical attention (report to your doctor or health care professional if they continue or are bothersome): -bone pain -muscle pain This list may not describe all possible side effects. Call your doctor for medical advice about side effects. You may report side effects to FDA at 1-800-FDA-1088. Where should I keep my medicine? Keep out of the reach of children. Store pre-filled syringes in a refrigerator between 2 and 8 degrees C (36 and 46 degrees F). Do not freeze. Keep in carton to protect from light. Throw away this medicine if it is left out of the refrigerator for more than 48 hours. Throw away any unused medicine after the expiration date. NOTE: This sheet is a summary. It may not cover all possible information. If you have questions about this medicine, talk to your doctor, pharmacist, or health care provider.  2018 Elsevier/Gold Standard (2016-05-16 12:58:03)  

## 2018-01-11 ENCOUNTER — Other Ambulatory Visit: Payer: Self-pay | Admitting: Oncology

## 2018-01-12 ENCOUNTER — Inpatient Hospital Stay: Payer: Medicare HMO

## 2018-01-12 ENCOUNTER — Telehealth: Payer: Self-pay | Admitting: Oncology

## 2018-01-12 ENCOUNTER — Telehealth: Payer: Self-pay

## 2018-01-12 ENCOUNTER — Inpatient Hospital Stay: Payer: Medicare HMO | Attending: Nurse Practitioner

## 2018-01-12 ENCOUNTER — Inpatient Hospital Stay (HOSPITAL_BASED_OUTPATIENT_CLINIC_OR_DEPARTMENT_OTHER): Payer: Medicare HMO | Admitting: Oncology

## 2018-01-12 VITALS — BP 128/92 | HR 63 | Temp 98.5°F | Resp 18 | Ht 72.0 in | Wt 185.1 lb

## 2018-01-12 DIAGNOSIS — Z5189 Encounter for other specified aftercare: Secondary | ICD-10-CM | POA: Diagnosis not present

## 2018-01-12 DIAGNOSIS — Z8042 Family history of malignant neoplasm of prostate: Secondary | ICD-10-CM | POA: Insufficient documentation

## 2018-01-12 DIAGNOSIS — Z5111 Encounter for antineoplastic chemotherapy: Secondary | ICD-10-CM | POA: Diagnosis not present

## 2018-01-12 DIAGNOSIS — Z803 Family history of malignant neoplasm of breast: Secondary | ICD-10-CM

## 2018-01-12 DIAGNOSIS — I10 Essential (primary) hypertension: Secondary | ICD-10-CM

## 2018-01-12 DIAGNOSIS — C787 Secondary malignant neoplasm of liver and intrahepatic bile duct: Secondary | ICD-10-CM

## 2018-01-12 DIAGNOSIS — G620 Drug-induced polyneuropathy: Secondary | ICD-10-CM

## 2018-01-12 DIAGNOSIS — C20 Malignant neoplasm of rectum: Secondary | ICD-10-CM

## 2018-01-12 DIAGNOSIS — Z95828 Presence of other vascular implants and grafts: Secondary | ICD-10-CM

## 2018-01-12 LAB — CBC WITH DIFFERENTIAL (CANCER CENTER ONLY)
Basophils Absolute: 0 10*3/uL (ref 0.0–0.1)
Basophils Relative: 0 %
EOS ABS: 0.2 10*3/uL (ref 0.0–0.5)
EOS PCT: 3 %
HCT: 40.7 % (ref 38.4–49.9)
Hemoglobin: 13.2 g/dL (ref 13.0–17.1)
LYMPHS ABS: 1 10*3/uL (ref 0.9–3.3)
Lymphocytes Relative: 14 %
MCH: 33.2 pg (ref 27.2–33.4)
MCHC: 32.4 g/dL (ref 32.0–36.0)
MCV: 102.5 fL — ABNORMAL HIGH (ref 79.3–98.0)
MONOS PCT: 13 %
Monocytes Absolute: 0.9 10*3/uL (ref 0.1–0.9)
Neutro Abs: 5.2 10*3/uL (ref 1.5–6.5)
Neutrophils Relative %: 70 %
PLATELETS: 202 10*3/uL (ref 140–400)
RBC: 3.97 MIL/uL — ABNORMAL LOW (ref 4.20–5.82)
RDW: 16 % — ABNORMAL HIGH (ref 11.0–14.6)
WBC: 7.3 10*3/uL (ref 4.0–10.3)

## 2018-01-12 LAB — CMP (CANCER CENTER ONLY)
ALK PHOS: 145 U/L — AB (ref 38–126)
ALT: 13 U/L (ref 0–44)
AST: 17 U/L (ref 15–41)
Albumin: 3.5 g/dL (ref 3.5–5.0)
Anion gap: 8 (ref 5–15)
BUN: 16 mg/dL (ref 8–23)
CHLORIDE: 108 mmol/L (ref 98–111)
CO2: 23 mmol/L (ref 22–32)
CREATININE: 1.28 mg/dL — AB (ref 0.61–1.24)
Calcium: 8.9 mg/dL (ref 8.9–10.3)
GFR, EST NON AFRICAN AMERICAN: 57 mL/min — AB (ref >60)
GFR, Est AFR Am: >60 mL/min (ref >60)
Glucose, Bld: 98 mg/dL (ref 70–99)
Potassium: 4.2 mmol/L (ref 3.5–5.1)
Sodium: 139 mmol/L (ref 135–145)
Total Bilirubin: 0.3 mg/dL (ref 0.3–1.2)
Total Protein: 7.2 g/dL (ref 6.5–8.1)

## 2018-01-12 LAB — MAGNESIUM: Magnesium: 2 mg/dL (ref 1.7–2.4)

## 2018-01-12 LAB — CEA (IN HOUSE-CHCC): CEA (CHCC-IN HOUSE): 4.28 ng/mL (ref 0.00–5.00)

## 2018-01-12 MED ORDER — PALONOSETRON HCL INJECTION 0.25 MG/5ML
INTRAVENOUS | Status: AC
  Filled 2018-01-12: qty 5

## 2018-01-12 MED ORDER — ATROPINE SULFATE 1 MG/ML IJ SOLN
0.5000 mg | Freq: Once | INTRAMUSCULAR | Status: AC | PRN
Start: 2018-01-12 — End: 2018-01-12
  Administered 2018-01-12: 0.5 mg via INTRAVENOUS

## 2018-01-12 MED ORDER — SODIUM CHLORIDE 0.9 % IV SOLN
INTRAVENOUS | Status: DC
  Administered 2018-01-12: 11:00:00 via INTRAVENOUS
  Filled 2018-01-12: qty 250

## 2018-01-12 MED ORDER — FLUOROURACIL CHEMO INJECTION 2.5 GM/50ML
300.0000 mg/m2 | Freq: Once | INTRAVENOUS | Status: AC
  Administered 2018-01-12: 600 mg via INTRAVENOUS
  Filled 2018-01-12: qty 12

## 2018-01-12 MED ORDER — DEXAMETHASONE SODIUM PHOSPHATE 10 MG/ML IJ SOLN
INTRAMUSCULAR | Status: AC
  Filled 2018-01-12: qty 1

## 2018-01-12 MED ORDER — SODIUM CHLORIDE 0.9 % IV SOLN
Freq: Once | INTRAVENOUS | Status: AC
  Administered 2018-01-12: 11:00:00 via INTRAVENOUS
  Filled 2018-01-12: qty 250

## 2018-01-12 MED ORDER — DEXTROSE 5 % IV SOLN
125.0000 mg/m2 | Freq: Once | INTRAVENOUS | Status: AC
  Administered 2018-01-12: 260 mg via INTRAVENOUS
  Filled 2018-01-12: qty 13

## 2018-01-12 MED ORDER — LEUCOVORIN CALCIUM INJECTION 350 MG
300.0000 mg/m2 | Freq: Once | INTRAVENOUS | Status: AC
  Administered 2018-01-12: 622 mg via INTRAVENOUS
  Filled 2018-01-12: qty 31.1

## 2018-01-12 MED ORDER — SODIUM CHLORIDE 0.9 % IJ SOLN
10.0000 mL | INTRAMUSCULAR | Status: DC | PRN
  Administered 2018-01-12: 10 mL via INTRAVENOUS
  Filled 2018-01-12: qty 10

## 2018-01-12 MED ORDER — SODIUM CHLORIDE 0.9 % IV SOLN
2410.0000 mg/m2 | INTRAVENOUS | Status: DC
  Administered 2018-01-12: 5000 mg via INTRAVENOUS
  Filled 2018-01-12: qty 100

## 2018-01-12 MED ORDER — DEXAMETHASONE SODIUM PHOSPHATE 10 MG/ML IJ SOLN
10.0000 mg | Freq: Once | INTRAMUSCULAR | Status: AC
  Administered 2018-01-12: 10 mg via INTRAVENOUS

## 2018-01-12 MED ORDER — ATROPINE SULFATE 1 MG/ML IJ SOLN
INTRAMUSCULAR | Status: AC
  Filled 2018-01-12: qty 1

## 2018-01-12 MED ORDER — PALONOSETRON HCL INJECTION 0.25 MG/5ML
0.2500 mg | Freq: Once | INTRAVENOUS | Status: AC
  Administered 2018-01-12: 0.25 mg via INTRAVENOUS

## 2018-01-12 NOTE — Telephone Encounter (Signed)
Scheduled appt per 8/12 los - gave patient AVS and calender per los.   

## 2018-01-12 NOTE — Patient Instructions (Signed)
[facility] Discharge Instructions for Patients Receiving Chemotherapy  Today you received the following chemotherapy agents: irinotecan, leucovorin, 5FU  To help prevent nausea and vomiting after your treatment, we encourage you to take your nausea medication as directed.   If you develop nausea and vomiting that is not controlled by your nausea medication, call the clinic.   BELOW ARE SYMPTOMS THAT SHOULD BE REPORTED IMMEDIATELY:  *FEVER GREATER THAN 100.5 F  *CHILLS WITH OR WITHOUT FEVER  NAUSEA AND VOMITING THAT IS NOT CONTROLLED WITH YOUR NAUSEA MEDICATION  *UNUSUAL SHORTNESS OF BREATH  *UNUSUAL BRUISING OR BLEEDING  TENDERNESS IN MOUTH AND THROAT WITH OR WITHOUT PRESENCE OF ULCERS  *URINARY PROBLEMS  *BOWEL PROBLEMS  UNUSUAL RASH Items with * indicate a potential emergency and should be followed up as soon as possible.  Feel free to call the clinic should you have any questions or concerns. The clinic phone number is [phone].  Please show the CHEMO ALERT CARD at check-in to the Emergency Department and triage nurse.   

## 2018-01-12 NOTE — Progress Notes (Signed)
[facility] OFFICE PROGRESS NOTE   Diagnosis: Rectal cancer  INTERVAL HISTORY:   Hunter Walker returns as scheduled.  He completed another cycle of FOLFIRI 12/29/2017.  He received Neulasta following this cycle of chemotherapy.  No nausea, diarrhea, or rectal pain.  He continues to have intermittent neuropathy symptoms.  He is no longer taking gabapentin.   Objective:  Vital signs in last 24 hours:  Blood pressure (!) 128/92, pulse 63, temperature 98.5 F (36.9 C), temperature source Oral, resp. rate 18, height 6' (1.829 m), weight 185 lb 1.6 oz (84 kg), SpO2 100 %.    HEENT: No buccal thrush or ulcers.  Mild white coat over the tongue Resp: Lungs clear bilaterally Cardio: Regular rate and rhythm GI: No hepatomegaly, no mass, no apparent ascites Vascular: No leg edema  Skin: Palms without erythema  Portacath/PICC-without erythema  Lab Results:  Lab Results  Component Value Date   WBC 7.3 01/12/2018   HGB 13.2 01/12/2018   HCT 40.7 01/12/2018   MCV 102.5 (H) 01/12/2018   PLT 202 01/12/2018   NEUTROABS 5.2 01/12/2018    CMP  Lab Results  Component Value Date   NA 139 01/12/2018   K 4.2 01/12/2018   CL 108 01/12/2018   CO2 23 01/12/2018   GLUCOSE 98 01/12/2018   BUN 16 01/12/2018   CREATININE 1.28 (H) 01/12/2018   CALCIUM 8.9 01/12/2018   PROT 7.2 01/12/2018   ALBUMIN 3.5 01/12/2018   AST 17 01/12/2018   ALT 13 01/12/2018   ALKPHOS 145 (H) 01/12/2018   BILITOT 0.3 01/12/2018   GFRNONAA 57 (L) 01/12/2018   GFRAA >60 01/12/2018    Lab Results  Component Value Date   CEA1 4.28 01/12/2018    Medications: I have reviewed the patient's current medications.   Assessment/Plan: 1. Rectal cancer, invasive adenocarcinoma confirmed at colonoscopy 05/22/2015, EUS staging 06/01/2015 revealed a uT3,N1 lesion at 7 cm from the anal verge  Staging CT abdomen/pelvis 05/15/2015 confirmed a mass at the rectosigmoid colon, a suspicious perirectal lymph  node, and a 6 mm right liver lesion felt to most likely be a cyst  CT chest 05/30/2015 with indeterminate pulmonary nodules and multiple liver lesions suspicious for metastases  Ultrasound 06/13/2014 with no liver lesions identified  MRI abdomen 06/21/2015 consistent with multiple liver metastases  CT biopsy of liver lesion 06/26/2015. Pathology showed metastatic adenocarcinoma consistent with colonic primary.MSI-stable, low mutation burden, no RAS or BRAFmutation  Cycle 1 FOLFOX 07/10/2015  Cycle 2 FOLFOX plus Avastin 07/24/2015 with Neulasta support  Cycle 3 FOLFOX plus Avastin 08/07/2015  Cycle 4 FOLFOX plus Avastin 08/21/2015  cycle 5 FOLFOX plus Avastin 09/04/2015  Restaging CTs 09/15/2015 revealed a decrease in the size of liver lesions, decreased rectal primary, stable/decreased size of tiny lung nodules  Cycle 6 FOLFOX plus Avastin 09/18/2015  Cycle 7 FOLFOX plus Avastin 10/02/2015  Cycle 8 FOLFOX plus Avastin 10/16/2015  Cycle 9 FOLFOX plus Avastin 11/06/2015  Cycle 10 FOLFOX plus Avastin 11/20/2015  CTs 12/07/2015-mild decrease in wall thickening at the rectum, decrease in tiny hepatic metastases, stable lung nodules, no evidence of progressive metastatic disease, new left lower lobe infiltrate  Treatment changed to every three-week Avastin with every other week Xeloda beginning 12/11/2015  Restaging CTs 04/12/2016-interval increase in size of adjacent right upper lobe pulmonary nodules and right lower lobe pulmonary nodule; unchanged 5 mm lesion right hepatic lobe; no additional visualized hepatic metastasis. Grossly unchanged wall thickening of the rectum.  Continuation of Xeloda every  other week and every three-week AvastinXeloda placed on hold 05/06/2016 due to hand-foot syndrome  Xeloda resumed 05/28/2016 1500 mg every morning and 1000 mg every afternoon 7 days on/7 days off  CT 09/27/2016-enlargement of lung nodules and new liver lesions, stable rectal  mass  Cycle 1 FOLFIRI/panitumumab 10/08/2016  Cycle 2 panitumumab 10/23/2016 (FOLFIRI held secondary to neutropenia)  Cycle 3 FOLFIRI/PANITUMUMAB 11/04/2016  Cycle 4 FOLFIRI/panitumumab 11/18/2016  Cycle 5 FOLFIRI/panitumumab 12/16/2016  Cycle 6 FOLFIRI/panitumumab 12/31/2016  CTs 01/16/2017-decreased size of pulmonary nodules and liver lesions. Decreased perirectal lymph node  Cycle 7 FOLFIRI/PANITUMUMAB 01/28/2017  Cycle 8FOLFIRI/PANITUMUMAB 02/10/2017  Cycle 9 FOLFIRI/PANITUMUMAB 02/24/2017  Cycle 10 FOLFIRI/panitumumab 03/10/2017 (irinotecan held secondary to neutropenia)  Cycle 11 FOLFIRI/panitumumab 03/24/2017  Cycle12 FOLFIRI/Panitumumab 04/07/2017  Restaging CTs 04/25/2017-stable rectal mass with adjacent 8 mm left perirectal nodal metastasis. Prior hepatic metastases not visualized on the current study. Indeterminate 16 mm enhancing lesion present in segment 6, indeterminate. Minimal residual nodularity in the right upper lobe. No new/progressive pulmonary metastases.  Maintenance Xeloda/panitumumab 04/28/2017  Restaging CTs 08/18/2017-new and enlarging bilateral lung nodules; 2 hyperattenuating lesions in the liver appear similar to prior study; progression of wall thickening in the rectum with enlarging perirectal lymph nodes.  Initiation of radiation/Xeloda 09/01/2017, completed 09/22/2017  Cycle 1 FOLFIRI/Panitumumab 10/13/2017  Cycle 2 FOLFIRI 11/03/2017  Cycle 3 FOLFIRI 11/17/2017  Cycle 4 FOLFIRI 12/01/2017  Cycle 5 FOLFIRI 12/15/2017  CTs 12/26/2017- mild progression lungs and liver; significantly improved rectal wall thickening; resolution of perirectal nodal adenopathy.  Cycle 6 FOLFIRI 12/29/2017  Cycle 7 FOLFIRI 01/12/2018 2.Multiple colon polyps on the colonoscopy 05/22/2015, tubular villous adenoma and a tubular adenoma were removed 3.Anemia-likely secondary to rectal bleeding.  4.Family history of multiple cancers including breast and prostate  cancer 5.Transient right arm numbness 06/20/2015 6. Port-A-Cath placement 07/06/2015 by Dr. Marcello Moores 7. Mild neutropenia secondary to chemotherapy following cycle 1 FOLFOX. Neulasta added with cycle 2. 8. Delayed nausea following chemotherapy-emend added with cycle 3 FOLFOX;Emend added with cycle 12 FOLFIRI. 9. Early oxaliplatin neuropathy 10. 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 Xeloda resumed at a reduced dose. 11. Bilateral hand weakness. Question related to previous oxaliplatin. Referred to neurology 06/17/2016. 12. Hypertension-amlodipine started 07/08/2016 13. Rash secondary to PANITUMUMAB.  14. Neutropenia following cycle 1 FOLFIRI/PANITUMUMAB; neutropenia following cycle 4 FOLFIRI/PANITUMUMAB.     Disposition: Hunter Walker appears stable.  He will complete another treatment with FOLFIRI today.  He will return for an office visit and chemotherapy in 2 weeks.  We will plan for a restaging CT evaluation 2- 2.5 months following the most recent CT.  15 minutes were spent with the patient today.  The majority of the time was used for counseling and coordination of care.  Betsy Coder, MD  01/12/2018  10:53 AM

## 2018-01-12 NOTE — Telephone Encounter (Signed)
Relayed message to infusion RN Diane. Diane to relay information to pt.

## 2018-01-12 NOTE — Telephone Encounter (Signed)
-----   Message from Ladell Pier, MD sent at 01/12/2018  1:45 PM EDT ----- Please call patient, the magnesium level is normal, okay to discontinue the magnesium supplement

## 2018-01-14 ENCOUNTER — Inpatient Hospital Stay: Payer: Medicare HMO

## 2018-01-14 VITALS — BP 128/82 | HR 62 | Temp 98.5°F | Resp 17

## 2018-01-14 DIAGNOSIS — C20 Malignant neoplasm of rectum: Secondary | ICD-10-CM | POA: Diagnosis not present

## 2018-01-14 DIAGNOSIS — Z5189 Encounter for other specified aftercare: Secondary | ICD-10-CM | POA: Diagnosis not present

## 2018-01-14 DIAGNOSIS — C787 Secondary malignant neoplasm of liver and intrahepatic bile duct: Secondary | ICD-10-CM | POA: Diagnosis not present

## 2018-01-14 DIAGNOSIS — Z803 Family history of malignant neoplasm of breast: Secondary | ICD-10-CM | POA: Diagnosis not present

## 2018-01-14 DIAGNOSIS — Z8042 Family history of malignant neoplasm of prostate: Secondary | ICD-10-CM | POA: Diagnosis not present

## 2018-01-14 DIAGNOSIS — I10 Essential (primary) hypertension: Secondary | ICD-10-CM | POA: Diagnosis not present

## 2018-01-14 DIAGNOSIS — Z5111 Encounter for antineoplastic chemotherapy: Secondary | ICD-10-CM | POA: Diagnosis not present

## 2018-01-14 DIAGNOSIS — G620 Drug-induced polyneuropathy: Secondary | ICD-10-CM | POA: Diagnosis not present

## 2018-01-14 MED ORDER — PEGFILGRASTIM-CBQV 6 MG/0.6ML ~~LOC~~ SOSY
6.0000 mg | PREFILLED_SYRINGE | Freq: Once | SUBCUTANEOUS | Status: AC
  Administered 2018-01-14: 6 mg via SUBCUTANEOUS

## 2018-01-14 MED ORDER — SODIUM CHLORIDE 0.9% FLUSH
10.0000 mL | INTRAVENOUS | Status: DC | PRN
  Administered 2018-01-14: 10 mL
  Filled 2018-01-14: qty 10

## 2018-01-14 MED ORDER — PEGFILGRASTIM-CBQV 6 MG/0.6ML ~~LOC~~ SOSY
PREFILLED_SYRINGE | SUBCUTANEOUS | Status: AC
Start: 2018-01-14 — End: ?
  Filled 2018-01-14: qty 0.6

## 2018-01-14 MED ORDER — HEPARIN SOD (PORK) LOCK FLUSH 100 UNIT/ML IV SOLN
500.0000 [IU] | Freq: Once | INTRAVENOUS | Status: AC | PRN
  Administered 2018-01-14: 500 [IU]
  Filled 2018-01-14: qty 5

## 2018-01-26 ENCOUNTER — Inpatient Hospital Stay (HOSPITAL_BASED_OUTPATIENT_CLINIC_OR_DEPARTMENT_OTHER): Payer: Medicare HMO | Admitting: Oncology

## 2018-01-26 ENCOUNTER — Inpatient Hospital Stay: Payer: Medicare HMO

## 2018-01-26 ENCOUNTER — Telehealth: Payer: Self-pay

## 2018-01-26 ENCOUNTER — Other Ambulatory Visit: Payer: Self-pay | Admitting: Oncology

## 2018-01-26 VITALS — BP 133/84 | HR 71 | Temp 98.6°F | Resp 18 | Ht 72.0 in | Wt 185.3 lb

## 2018-01-26 DIAGNOSIS — C787 Secondary malignant neoplasm of liver and intrahepatic bile duct: Secondary | ICD-10-CM

## 2018-01-26 DIAGNOSIS — Z95828 Presence of other vascular implants and grafts: Secondary | ICD-10-CM

## 2018-01-26 DIAGNOSIS — Z5189 Encounter for other specified aftercare: Secondary | ICD-10-CM | POA: Diagnosis not present

## 2018-01-26 DIAGNOSIS — Z803 Family history of malignant neoplasm of breast: Secondary | ICD-10-CM

## 2018-01-26 DIAGNOSIS — Z5111 Encounter for antineoplastic chemotherapy: Secondary | ICD-10-CM | POA: Diagnosis not present

## 2018-01-26 DIAGNOSIS — C20 Malignant neoplasm of rectum: Secondary | ICD-10-CM

## 2018-01-26 DIAGNOSIS — I10 Essential (primary) hypertension: Secondary | ICD-10-CM

## 2018-01-26 DIAGNOSIS — Z8042 Family history of malignant neoplasm of prostate: Secondary | ICD-10-CM

## 2018-01-26 DIAGNOSIS — G620 Drug-induced polyneuropathy: Secondary | ICD-10-CM | POA: Diagnosis not present

## 2018-01-26 LAB — CBC WITH DIFFERENTIAL (CANCER CENTER ONLY)
Basophils Absolute: 0 10*3/uL (ref 0.0–0.1)
Basophils Relative: 0 %
EOS ABS: 0.1 10*3/uL (ref 0.0–0.5)
Eosinophils Relative: 2 %
HCT: 40.4 % (ref 38.4–49.9)
HEMOGLOBIN: 13 g/dL (ref 13.0–17.1)
LYMPHS ABS: 1.1 10*3/uL (ref 0.9–3.3)
LYMPHS PCT: 15 %
MCH: 32.5 pg (ref 27.2–33.4)
MCHC: 32.2 g/dL (ref 32.0–36.0)
MCV: 101 fL — AB (ref 79.3–98.0)
MONOS PCT: 9 %
Monocytes Absolute: 0.6 10*3/uL (ref 0.1–0.9)
NEUTROS PCT: 74 %
Neutro Abs: 5.6 10*3/uL (ref 1.5–6.5)
Platelet Count: 217 10*3/uL (ref 140–400)
RBC: 4 MIL/uL — ABNORMAL LOW (ref 4.20–5.82)
RDW: 16.1 % — ABNORMAL HIGH (ref 11.0–14.6)
WBC Count: 7.5 10*3/uL (ref 4.0–10.3)

## 2018-01-26 LAB — CMP (CANCER CENTER ONLY)
ALT: 11 U/L (ref 0–44)
AST: 20 U/L (ref 15–41)
Albumin: 3.5 g/dL (ref 3.5–5.0)
Alkaline Phosphatase: 149 U/L — ABNORMAL HIGH (ref 38–126)
Anion gap: 6 (ref 5–15)
BUN: 14 mg/dL (ref 8–23)
CHLORIDE: 109 mmol/L (ref 98–111)
CO2: 26 mmol/L (ref 22–32)
CREATININE: 1.17 mg/dL (ref 0.61–1.24)
Calcium: 9.1 mg/dL (ref 8.9–10.3)
GFR, Estimated: >60 mL/min (ref >60)
Glucose, Bld: 100 mg/dL — ABNORMAL HIGH (ref 70–99)
Potassium: 3.9 mmol/L (ref 3.5–5.1)
Sodium: 141 mmol/L (ref 135–145)
Total Bilirubin: 0.3 mg/dL (ref 0.3–1.2)
Total Protein: 7.2 g/dL (ref 6.5–8.1)

## 2018-01-26 MED ORDER — LEUCOVORIN CALCIUM INJECTION 350 MG
300.0000 mg/m2 | Freq: Once | INTRAVENOUS | Status: AC
  Administered 2018-01-26: 622 mg via INTRAVENOUS
  Filled 2018-01-26: qty 31.1

## 2018-01-26 MED ORDER — ATROPINE SULFATE 1 MG/ML IJ SOLN
0.5000 mg | Freq: Once | INTRAMUSCULAR | Status: AC | PRN
  Administered 2018-01-26: 0.5 mg via INTRAVENOUS

## 2018-01-26 MED ORDER — PALONOSETRON HCL INJECTION 0.25 MG/5ML
INTRAVENOUS | Status: AC
  Filled 2018-01-26: qty 5

## 2018-01-26 MED ORDER — FLUOROURACIL CHEMO INJECTION 2.5 GM/50ML
300.0000 mg/m2 | Freq: Once | INTRAVENOUS | Status: AC
  Administered 2018-01-26: 600 mg via INTRAVENOUS
  Filled 2018-01-26: qty 12

## 2018-01-26 MED ORDER — DEXAMETHASONE SODIUM PHOSPHATE 10 MG/ML IJ SOLN
INTRAMUSCULAR | Status: AC
  Filled 2018-01-26: qty 1

## 2018-01-26 MED ORDER — ATROPINE SULFATE 1 MG/ML IJ SOLN
INTRAMUSCULAR | Status: AC
  Filled 2018-01-26: qty 1

## 2018-01-26 MED ORDER — SODIUM CHLORIDE 0.9 % IV SOLN
2425.0000 mg/m2 | INTRAVENOUS | Status: DC
  Administered 2018-01-26: 5000 mg via INTRAVENOUS
  Filled 2018-01-26: qty 100

## 2018-01-26 MED ORDER — SODIUM CHLORIDE 0.9 % IV SOLN
Freq: Once | INTRAVENOUS | Status: AC
  Administered 2018-01-26: 12:00:00 via INTRAVENOUS
  Filled 2018-01-26: qty 250

## 2018-01-26 MED ORDER — IRINOTECAN HCL CHEMO INJECTION 100 MG/5ML
125.0000 mg/m2 | Freq: Once | INTRAVENOUS | Status: AC
  Administered 2018-01-26: 260 mg via INTRAVENOUS
  Filled 2018-01-26: qty 13

## 2018-01-26 MED ORDER — PALONOSETRON HCL INJECTION 0.25 MG/5ML
0.2500 mg | Freq: Once | INTRAVENOUS | Status: AC
  Administered 2018-01-26: 0.25 mg via INTRAVENOUS

## 2018-01-26 MED ORDER — SODIUM CHLORIDE 0.9 % IJ SOLN
10.0000 mL | INTRAMUSCULAR | Status: DC | PRN
  Administered 2018-01-26: 10 mL via INTRAVENOUS
  Filled 2018-01-26: qty 10

## 2018-01-26 MED ORDER — DEXAMETHASONE SODIUM PHOSPHATE 10 MG/ML IJ SOLN
10.0000 mg | Freq: Once | INTRAMUSCULAR | Status: AC
  Administered 2018-01-26: 10 mg via INTRAVENOUS

## 2018-01-26 NOTE — Telephone Encounter (Signed)
Printed avs and calender of upcoming appointment. Per 8/26 los 

## 2018-01-26 NOTE — Progress Notes (Signed)
[facility] OFFICE PROGRESS NOTE   Diagnosis: Rectal cancer  INTERVAL HISTORY:   Ms. Schroepfer returns as scheduled.  He completed another cycle of FOLFIRI on 01/12/2018.  No mouth sores, nausea, or diarrhea.  He feels well.  No rectal pain.  Objective:  Vital signs in last 24 hours:  Blood pressure 133/84, pulse 71, temperature 98.6 F (37 C), temperature source Oral, resp. rate 18, height 6' (1.829 m), weight 185 lb 4.8 oz (84.1 kg), SpO2 100 %.    HEENT: No thrush or ulcers Resp: Lungs clear bilaterally Cardio: Regular rate and rhythm GI: No hepatomegaly, nontender Vascular: Leg edema    Portacath/PICC-without erythema  Lab Results:  Lab Results  Component Value Date   WBC 7.5 01/26/2018   HGB 13.0 01/26/2018   HCT 40.4 01/26/2018   MCV 101.0 (H) 01/26/2018   PLT 217 01/26/2018   NEUTROABS 5.6 01/26/2018    CMP  Lab Results  Component Value Date   NA 139 01/12/2018   K 4.2 01/12/2018   CL 108 01/12/2018   CO2 23 01/12/2018   GLUCOSE 98 01/12/2018   BUN 16 01/12/2018   CREATININE 1.28 (H) 01/12/2018   CALCIUM 8.9 01/12/2018   PROT 7.2 01/12/2018   ALBUMIN 3.5 01/12/2018   AST 17 01/12/2018   ALT 13 01/12/2018   ALKPHOS 145 (H) 01/12/2018   BILITOT 0.3 01/12/2018   GFRNONAA 57 (L) 01/12/2018   GFRAA >60 01/12/2018    Lab Results  Component Value Date   CEA1 4.28 01/12/2018    Medications: I have reviewed the patient's current medications.   Assessment/Plan: 1. Rectal cancer, invasive adenocarcinoma confirmed at colonoscopy 05/22/2015, EUS staging 06/01/2015 revealed a uT3,N1 lesion at 7 cm from the anal verge  Staging CT abdomen/pelvis 05/15/2015 confirmed a mass at the rectosigmoid colon, a suspicious perirectal lymph node, and a 6 mm right liver lesion felt to most likely be a cyst  CT chest 05/30/2015 with indeterminate pulmonary nodules and multiple liver lesions suspicious for metastases  Ultrasound 06/13/2014 with no liver  lesions identified  MRI abdomen 06/21/2015 consistent with multiple liver metastases  CT biopsy of liver lesion 06/26/2015. Pathology showed metastatic adenocarcinoma consistent with colonic primary.MSI-stable, low mutation burden, no RAS or BRAFmutation  Cycle 1 FOLFOX 07/10/2015  Cycle 2 FOLFOX plus Avastin 07/24/2015 with Neulasta support  Cycle 3 FOLFOX plus Avastin 08/07/2015  Cycle 4 FOLFOX plus Avastin 08/21/2015  cycle 5 FOLFOX plus Avastin 09/04/2015  Restaging CTs 09/15/2015 revealed a decrease in the size of liver lesions, decreased rectal primary, stable/decreased size of tiny lung nodules  Cycle 6 FOLFOX plus Avastin 09/18/2015  Cycle 7 FOLFOX plus Avastin 10/02/2015  Cycle 8 FOLFOX plus Avastin 10/16/2015  Cycle 9 FOLFOX plus Avastin 11/06/2015  Cycle 10 FOLFOX plus Avastin 11/20/2015  CTs 12/07/2015-mild decrease in wall thickening at the rectum, decrease in tiny hepatic metastases, stable lung nodules, no evidence of progressive metastatic disease, new left lower lobe infiltrate  Treatment changed to every three-week Avastin with every other week Xeloda beginning 12/11/2015  Restaging CTs 04/12/2016-interval increase in size of adjacent right upper lobe pulmonary nodules and right lower lobe pulmonary nodule; unchanged 5 mm lesion right hepatic lobe; no additional visualized hepatic metastasis. Grossly unchanged wall thickening of the rectum.  Continuation of Xeloda every other week and every three-week AvastinXeloda placed on hold 05/06/2016 due to hand-foot syndrome  Xeloda resumed 05/28/2016 1500 mg every morning and 1000 mg every afternoon 7 days on/7 days off  CT 09/27/2016-enlargement of lung nodules and new liver lesions, stable rectal mass  Cycle 1 FOLFIRI/panitumumab 10/08/2016  Cycle 2 panitumumab 10/23/2016 (FOLFIRI held secondary to neutropenia)  Cycle 3 FOLFIRI/PANITUMUMAB 11/04/2016  Cycle 4 FOLFIRI/panitumumab 11/18/2016  Cycle 5  FOLFIRI/panitumumab 12/16/2016  Cycle 6 FOLFIRI/panitumumab 12/31/2016  CTs 01/16/2017-decreased size of pulmonary nodules and liver lesions. Decreased perirectal lymph node  Cycle 7 FOLFIRI/PANITUMUMAB 01/28/2017  Cycle 8FOLFIRI/PANITUMUMAB 02/10/2017  Cycle 9 FOLFIRI/PANITUMUMAB 02/24/2017  Cycle 10 FOLFIRI/panitumumab 03/10/2017 (irinotecan held secondary to neutropenia)  Cycle 11 FOLFIRI/panitumumab 03/24/2017  Cycle12 FOLFIRI/Panitumumab 04/07/2017  Restaging CTs 04/25/2017-stable rectal mass with adjacent 8 mm left perirectal nodal metastasis. Prior hepatic metastases not visualized on the current study. Indeterminate 16 mm enhancing lesion present in segment 6, indeterminate. Minimal residual nodularity in the right upper lobe. No new/progressive pulmonary metastases.  Maintenance Xeloda/panitumumab 04/28/2017  Restaging CTs 08/18/2017-new and enlarging bilateral lung nodules; 2 hyperattenuating lesions in the liver appear similar to prior study; progression of wall thickening in the rectum with enlarging perirectal lymph nodes.  Initiation of radiation/Xeloda 09/01/2017, completed 09/22/2017  Cycle 1 FOLFIRI/Panitumumab 10/13/2017  Cycle 2 FOLFIRI 11/03/2017  Cycle 3 FOLFIRI 11/17/2017  Cycle 4 FOLFIRI 12/01/2017  Cycle 5 FOLFIRI 12/15/2017  CTs7/26/2019-mild progression lungs and liver; significantly improved rectal wall thickening; resolution of perirectal nodal adenopathy.  Cycle 6 FOLFIRI 12/29/2017  Cycle 7 FOLFIRI 01/12/2018  Cycle 8 FOLFIRI 01/26/2018 2.Multiple colon polyps on the colonoscopy 05/22/2015, tubular villous adenoma and a tubular adenoma were removed 3.Anemia-likely secondary to rectal bleeding.  4.Family history of multiple cancers including breast and prostate cancer 5.Transient right arm numbness 06/20/2015 6. Port-A-Cath placement 07/06/2015 by Dr. Marcello Moores 7. Mild neutropenia secondary to chemotherapy following cycle 1 FOLFOX. Neulasta added  with cycle 2. 8. Delayed nausea following chemotherapy-emend added with cycle 3 FOLFOX;Emend added with cycle 12 FOLFIRI. 9. Early oxaliplatin neuropathy 10. 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 Xeloda resumed at a reduced dose. 11. Bilateral hand weakness. Question related to previous oxaliplatin. Referred to neurology 06/17/2016. 12. Hypertension-amlodipine started 07/08/2016 13. Rash secondary to PANITUMUMAB.  14. Neutropenia following cycle 1 FOLFIRI/PANITUMUMAB; neutropenia following cycle 4 FOLFIRI/PANITUMUMAB.    Disposition: Hunter Walker appears well.  He is tolerating the FOLFIRI without significant acute toxicity.  He will complete another cycle of FOLFIRI today.  He will return for an office visit and chemotherapy in 2 weeks.  The plan is to schedule a restaging CT after the cycle of chemotherapy on 02/23/2018.  15 minutes were spent with the patient today.  The majority of the time was used for counseling and coordination of care.  Betsy Coder, MD  01/26/2018  10:59 AM

## 2018-01-26 NOTE — Patient Instructions (Signed)
[facility] Discharge Instructions for Patients Receiving Chemotherapy  Today you received the following chemotherapy agents:  Irinotecan, Leucovorin, and 5FU.   To help prevent nausea and vomiting after your treatment, we encourage you to take your nausea medication as directed.   If you develop nausea and vomiting that is not controlled by your nausea medication, call the clinic.   BELOW ARE SYMPTOMS THAT SHOULD BE REPORTED IMMEDIATELY:  *FEVER GREATER THAN 100.5 F  *CHILLS WITH OR WITHOUT FEVER  NAUSEA AND VOMITING THAT IS NOT CONTROLLED WITH YOUR NAUSEA MEDICATION  *UNUSUAL SHORTNESS OF BREATH  *UNUSUAL BRUISING OR BLEEDING  TENDERNESS IN MOUTH AND THROAT WITH OR WITHOUT PRESENCE OF ULCERS  *URINARY PROBLEMS  *BOWEL PROBLEMS  UNUSUAL RASH Items with * indicate a potential emergency and should be followed up as soon as possible.  Feel free to call the clinic should you have any questions or concerns. The clinic phone number is [phone].  Please show the CHEMO ALERT CARD at check-in to the Emergency Department and triage nurse.   

## 2018-01-28 ENCOUNTER — Inpatient Hospital Stay: Payer: Medicare HMO

## 2018-01-28 DIAGNOSIS — C787 Secondary malignant neoplasm of liver and intrahepatic bile duct: Secondary | ICD-10-CM | POA: Diagnosis not present

## 2018-01-28 DIAGNOSIS — Z8042 Family history of malignant neoplasm of prostate: Secondary | ICD-10-CM | POA: Diagnosis not present

## 2018-01-28 DIAGNOSIS — I10 Essential (primary) hypertension: Secondary | ICD-10-CM | POA: Diagnosis not present

## 2018-01-28 DIAGNOSIS — Z5111 Encounter for antineoplastic chemotherapy: Secondary | ICD-10-CM | POA: Diagnosis not present

## 2018-01-28 DIAGNOSIS — Z5189 Encounter for other specified aftercare: Secondary | ICD-10-CM | POA: Diagnosis not present

## 2018-01-28 DIAGNOSIS — C20 Malignant neoplasm of rectum: Secondary | ICD-10-CM

## 2018-01-28 DIAGNOSIS — Z803 Family history of malignant neoplasm of breast: Secondary | ICD-10-CM | POA: Diagnosis not present

## 2018-01-28 DIAGNOSIS — G620 Drug-induced polyneuropathy: Secondary | ICD-10-CM | POA: Diagnosis not present

## 2018-01-28 MED ORDER — PEGFILGRASTIM-CBQV 6 MG/0.6ML ~~LOC~~ SOSY
6.0000 mg | PREFILLED_SYRINGE | Freq: Once | SUBCUTANEOUS | Status: AC
  Administered 2018-01-28: 6 mg via SUBCUTANEOUS

## 2018-01-28 MED ORDER — PEGFILGRASTIM-CBQV 6 MG/0.6ML ~~LOC~~ SOSY
PREFILLED_SYRINGE | SUBCUTANEOUS | Status: AC
  Filled 2018-01-28: qty 0.6

## 2018-01-28 MED ORDER — HEPARIN SOD (PORK) LOCK FLUSH 100 UNIT/ML IV SOLN
500.0000 [IU] | Freq: Once | INTRAVENOUS | Status: AC | PRN
  Administered 2018-01-28: 500 [IU]
  Filled 2018-01-28: qty 5

## 2018-01-28 MED ORDER — SODIUM CHLORIDE 0.9% FLUSH
10.0000 mL | INTRAVENOUS | Status: DC | PRN
  Administered 2018-01-28: 10 mL
  Filled 2018-01-28: qty 10

## 2018-01-28 NOTE — Patient Instructions (Signed)
Pegfilgrastim injection What is this medicine? PEGFILGRASTIM (PEG fil gra stim) is a long-acting granulocyte colony-stimulating factor that stimulates the growth of neutrophils, a type of white blood cell important in the body's fight against infection. It is used to reduce the incidence of fever and infection in patients with certain types of cancer who are receiving chemotherapy that affects the bone marrow, and to increase survival after being exposed to high doses of radiation. This medicine may be used for other purposes; ask your health care provider or pharmacist if you have questions. COMMON BRAND NAME(S): Neulasta What should I tell my health care provider before I take this medicine? They need to know if you have any of these conditions: -kidney disease -latex allergy -ongoing radiation therapy -sickle cell disease -skin reactions to acrylic adhesives (On-Body Injector only) -an unusual or allergic reaction to pegfilgrastim, filgrastim, other medicines, foods, dyes, or preservatives -pregnant or trying to get pregnant -breast-feeding How should I use this medicine? This medicine is for injection under the skin. If you get this medicine at home, you will be taught how to prepare and give the pre-filled syringe or how to use the On-body Injector. Refer to the patient Instructions for Use for detailed instructions. Use exactly as directed. Tell your healthcare provider immediately if you suspect that the On-body Injector may not have performed as intended or if you suspect the use of the On-body Injector resulted in a missed or partial dose. It is important that you put your used needles and syringes in a special sharps container. Do not put them in a trash can. If you do not have a sharps container, call your pharmacist or healthcare provider to get one. Talk to your pediatrician regarding the use of this medicine in children. While this drug may be prescribed for selected conditions,  precautions do apply. Overdosage: If you think you have taken too much of this medicine contact a poison control center or emergency room at once. NOTE: This medicine is only for you. Do not share this medicine with others. What if I miss a dose? It is important not to miss your dose. Call your doctor or health care professional if you miss your dose. If you miss a dose due to an On-body Injector failure or leakage, a new dose should be administered as soon as possible using a single prefilled syringe for manual use. What may interact with this medicine? Interactions have not been studied. Give your health care provider a list of all the medicines, herbs, non-prescription drugs, or dietary supplements you use. Also tell them if you smoke, drink alcohol, or use illegal drugs. Some items may interact with your medicine. This list may not describe all possible interactions. Give your health care provider a list of all the medicines, herbs, non-prescription drugs, or dietary supplements you use. Also tell them if you smoke, drink alcohol, or use illegal drugs. Some items may interact with your medicine. What should I watch for while using this medicine? You may need blood work done while you are taking this medicine. If you are going to need a MRI, CT scan, or other procedure, tell your doctor that you are using this medicine (On-Body Injector only). What side effects may I notice from receiving this medicine? Side effects that you should report to your doctor or health care professional as soon as possible: -allergic reactions like skin rash, itching or hives, swelling of the face, lips, or tongue -dizziness -fever -pain, redness, or irritation at site   where injected -pinpoint red spots on the skin -red or dark-brown urine -shortness of breath or breathing problems -stomach or side pain, or pain at the shoulder -swelling -tiredness -trouble passing urine or change in the amount of urine Side  effects that usually do not require medical attention (report to your doctor or health care professional if they continue or are bothersome): -bone pain -muscle pain This list may not describe all possible side effects. Call your doctor for medical advice about side effects. You may report side effects to FDA at 1-800-FDA-1088. Where should I keep my medicine? Keep out of the reach of children. Store pre-filled syringes in a refrigerator between 2 and 8 degrees C (36 and 46 degrees F). Do not freeze. Keep in carton to protect from light. Throw away this medicine if it is left out of the refrigerator for more than 48 hours. Throw away any unused medicine after the expiration date. NOTE: This sheet is a summary. It may not cover all possible information. If you have questions about this medicine, talk to your doctor, pharmacist, or health care provider.  2018 Elsevier/Gold Standard (2016-05-16 12:58:03)  

## 2018-01-29 ENCOUNTER — Other Ambulatory Visit: Payer: Self-pay | Admitting: *Deleted

## 2018-01-29 DIAGNOSIS — C20 Malignant neoplasm of rectum: Secondary | ICD-10-CM

## 2018-01-29 MED ORDER — PANTOPRAZOLE SODIUM 40 MG PO TBEC: 40.0000 mg | DELAYED_RELEASE_TABLET | Freq: Every day | ORAL | 3 refills | Status: DC

## 2018-02-02 ENCOUNTER — Other Ambulatory Visit: Payer: Self-pay | Admitting: Oncology

## 2018-02-09 ENCOUNTER — Inpatient Hospital Stay: Payer: Medicare HMO | Attending: Nurse Practitioner

## 2018-02-09 ENCOUNTER — Telehealth: Payer: Self-pay | Admitting: Oncology

## 2018-02-09 ENCOUNTER — Inpatient Hospital Stay (HOSPITAL_BASED_OUTPATIENT_CLINIC_OR_DEPARTMENT_OTHER): Payer: Medicare HMO | Admitting: Oncology

## 2018-02-09 ENCOUNTER — Inpatient Hospital Stay: Payer: Medicare HMO

## 2018-02-09 VITALS — BP 126/87 | HR 68 | Temp 98.6°F | Resp 18 | Ht 72.0 in | Wt 182.2 lb

## 2018-02-09 DIAGNOSIS — Z5189 Encounter for other specified aftercare: Secondary | ICD-10-CM | POA: Insufficient documentation

## 2018-02-09 DIAGNOSIS — Z8042 Family history of malignant neoplasm of prostate: Secondary | ICD-10-CM | POA: Diagnosis not present

## 2018-02-09 DIAGNOSIS — C787 Secondary malignant neoplasm of liver and intrahepatic bile duct: Secondary | ICD-10-CM | POA: Diagnosis not present

## 2018-02-09 DIAGNOSIS — G620 Drug-induced polyneuropathy: Secondary | ICD-10-CM | POA: Insufficient documentation

## 2018-02-09 DIAGNOSIS — I10 Essential (primary) hypertension: Secondary | ICD-10-CM | POA: Insufficient documentation

## 2018-02-09 DIAGNOSIS — C20 Malignant neoplasm of rectum: Secondary | ICD-10-CM

## 2018-02-09 DIAGNOSIS — Z5111 Encounter for antineoplastic chemotherapy: Secondary | ICD-10-CM | POA: Diagnosis not present

## 2018-02-09 DIAGNOSIS — Z95828 Presence of other vascular implants and grafts: Secondary | ICD-10-CM

## 2018-02-09 DIAGNOSIS — Z803 Family history of malignant neoplasm of breast: Secondary | ICD-10-CM

## 2018-02-09 LAB — CMP (CANCER CENTER ONLY)
ALT: 10 U/L (ref 0–44)
AST: 15 U/L (ref 15–41)
Albumin: 3.5 g/dL (ref 3.5–5.0)
Alkaline Phosphatase: 142 U/L — ABNORMAL HIGH (ref 38–126)
Anion gap: 7 (ref 5–15)
BUN: 14 mg/dL (ref 8–23)
CHLORIDE: 107 mmol/L (ref 98–111)
CO2: 26 mmol/L (ref 22–32)
Calcium: 9.2 mg/dL (ref 8.9–10.3)
Creatinine: 1.29 mg/dL — ABNORMAL HIGH (ref 0.61–1.24)
GFR, EST NON AFRICAN AMERICAN: 56 mL/min — AB (ref >60)
Glucose, Bld: 120 mg/dL — ABNORMAL HIGH (ref 70–99)
POTASSIUM: 4.1 mmol/L (ref 3.5–5.1)
SODIUM: 140 mmol/L (ref 135–145)
Total Bilirubin: 0.3 mg/dL (ref 0.3–1.2)
Total Protein: 7.7 g/dL (ref 6.5–8.1)

## 2018-02-09 LAB — CBC WITH DIFFERENTIAL (CANCER CENTER ONLY)
Basophils Absolute: 0 10*3/uL (ref 0.0–0.1)
Basophils Relative: 0 %
EOS ABS: 0.1 10*3/uL (ref 0.0–0.5)
Eosinophils Relative: 1 %
HEMATOCRIT: 40.7 % (ref 38.4–49.9)
HEMOGLOBIN: 13.1 g/dL (ref 13.0–17.1)
LYMPHS ABS: 1.2 10*3/uL (ref 0.9–3.3)
LYMPHS PCT: 17 %
MCH: 32.2 pg (ref 27.2–33.4)
MCHC: 32.2 g/dL (ref 32.0–36.0)
MCV: 100 fL — AB (ref 79.3–98.0)
MONOS PCT: 8 %
Monocytes Absolute: 0.5 10*3/uL (ref 0.1–0.9)
NEUTROS PCT: 74 %
Neutro Abs: 5.2 10*3/uL (ref 1.5–6.5)
Platelet Count: 259 10*3/uL (ref 140–400)
RBC: 4.07 MIL/uL — AB (ref 4.20–5.82)
RDW: 15.9 % — ABNORMAL HIGH (ref 11.0–14.6)
WBC: 7 10*3/uL (ref 4.0–10.3)

## 2018-02-09 MED ORDER — ATROPINE SULFATE 1 MG/ML IJ SOLN
INTRAMUSCULAR | Status: AC
  Filled 2018-02-09: qty 1

## 2018-02-09 MED ORDER — IRINOTECAN HCL CHEMO INJECTION 100 MG/5ML
125.0000 mg/m2 | Freq: Once | INTRAVENOUS | Status: AC
  Administered 2018-02-09: 260 mg via INTRAVENOUS
  Filled 2018-02-09: qty 13

## 2018-02-09 MED ORDER — ATROPINE SULFATE 1 MG/ML IJ SOLN
0.5000 mg | Freq: Once | INTRAMUSCULAR | Status: AC | PRN
  Administered 2018-02-09: 0.5 mg via INTRAVENOUS

## 2018-02-09 MED ORDER — SODIUM CHLORIDE 0.9 % IJ SOLN
10.0000 mL | INTRAMUSCULAR | Status: DC | PRN
  Administered 2018-02-09: 10 mL via INTRAVENOUS
  Filled 2018-02-09: qty 10

## 2018-02-09 MED ORDER — DEXAMETHASONE SODIUM PHOSPHATE 10 MG/ML IJ SOLN
INTRAMUSCULAR | Status: AC
  Filled 2018-02-09: qty 1

## 2018-02-09 MED ORDER — PALONOSETRON HCL INJECTION 0.25 MG/5ML
INTRAVENOUS | Status: AC
  Filled 2018-02-09: qty 5

## 2018-02-09 MED ORDER — DEXAMETHASONE SODIUM PHOSPHATE 10 MG/ML IJ SOLN
10.0000 mg | Freq: Once | INTRAMUSCULAR | Status: AC
  Administered 2018-02-09: 10 mg via INTRAVENOUS

## 2018-02-09 MED ORDER — SODIUM CHLORIDE 0.9 % IV SOLN
Freq: Once | INTRAVENOUS | Status: AC
  Administered 2018-02-09: 11:00:00 via INTRAVENOUS
  Filled 2018-02-09: qty 250

## 2018-02-09 MED ORDER — SODIUM CHLORIDE 0.9 % IV SOLN
5000.0000 mg | INTRAVENOUS | Status: DC
  Administered 2018-02-09: 5000 mg via INTRAVENOUS
  Filled 2018-02-09: qty 100

## 2018-02-09 MED ORDER — FLUOROURACIL CHEMO INJECTION 2.5 GM/50ML
300.0000 mg/m2 | Freq: Once | INTRAVENOUS | Status: AC
  Administered 2018-02-09: 600 mg via INTRAVENOUS
  Filled 2018-02-09: qty 12

## 2018-02-09 MED ORDER — PALONOSETRON HCL INJECTION 0.25 MG/5ML
0.2500 mg | Freq: Once | INTRAVENOUS | Status: AC
  Administered 2018-02-09: 0.25 mg via INTRAVENOUS

## 2018-02-09 MED ORDER — LEUCOVORIN CALCIUM INJECTION 350 MG
300.0000 mg/m2 | Freq: Once | INTRAVENOUS | Status: AC
  Administered 2018-02-09: 622 mg via INTRAVENOUS
  Filled 2018-02-09: qty 31.1

## 2018-02-09 NOTE — Patient Instructions (Signed)
[facility] Discharge Instructions for Patients Receiving Chemotherapy  Today you received the following chemotherapy agents:  Irinotecan, Leucovorin, and 5FU.   To help prevent nausea and vomiting after your treatment, we encourage you to take your nausea medication as directed.   If you develop nausea and vomiting that is not controlled by your nausea medication, call the clinic.   BELOW ARE SYMPTOMS THAT SHOULD BE REPORTED IMMEDIATELY:  *FEVER GREATER THAN 100.5 F  *CHILLS WITH OR WITHOUT FEVER  NAUSEA AND VOMITING THAT IS NOT CONTROLLED WITH YOUR NAUSEA MEDICATION  *UNUSUAL SHORTNESS OF BREATH  *UNUSUAL BRUISING OR BLEEDING  TENDERNESS IN MOUTH AND THROAT WITH OR WITHOUT PRESENCE OF ULCERS  *URINARY PROBLEMS  *BOWEL PROBLEMS  UNUSUAL RASH Items with * indicate a potential emergency and should be followed up as soon as possible.  Feel free to call the clinic should you have any questions or concerns. The clinic phone number is [phone].  Please show the CHEMO ALERT CARD at check-in to the Emergency Department and triage nurse.   

## 2018-02-09 NOTE — Progress Notes (Signed)
[place] OFFICE PROGRESS NOTE   Diagnosis: Rectal cancer  INTERVAL HISTORY:   Hunter Walker returns as scheduled.  He feels well.  He completed another cycle of FOLFIRI on 01/26/2018.  No nausea, mouth sores, diarrhea, or hand/foot pain.  He feels well.  Objective:  Vital signs in last 24 hours:  Blood pressure 126/87, pulse 68, temperature 98.6 F (37 C), temperature source Oral, resp. rate 18, height 6' (1.829 m), weight 182 lb 3.2 oz (82.6 kg), SpO2 100 %.    HEENT: No thrush or ulcers Resp: Lungs clear bilaterally Cardio: Regular rate and rhythm GI: Nontender, no hepatomegaly Vascular: No leg edema  Skin: Skin thickening and hyperpigmentation of the hands  Portacath/PICC-without erythema  Lab Results:  Lab Results  Component Value Date   WBC 7.0 02/09/2018   HGB 13.1 02/09/2018   HCT 40.7 02/09/2018   MCV 100.0 (H) 02/09/2018   PLT 259 02/09/2018   NEUTROABS 5.2 02/09/2018    CMP  Lab Results  Component Value Date   NA 140 02/09/2018   K 4.1 02/09/2018   CL 107 02/09/2018   CO2 26 02/09/2018   GLUCOSE 120 (H) 02/09/2018   BUN 14 02/09/2018   CREATININE 1.29 (H) 02/09/2018   CALCIUM 9.2 02/09/2018   PROT 7.7 02/09/2018   ALBUMIN 3.5 02/09/2018   AST 15 02/09/2018   ALT 10 02/09/2018   ALKPHOS 142 (H) 02/09/2018   BILITOT 0.3 02/09/2018   GFRNONAA 56 (L) 02/09/2018   GFRAA >60 02/09/2018    Lab Results  Component Value Date   CEA1 4.28 01/12/2018     Medications: I have reviewed the patient's current medications.   Assessment/Plan: 1. Rectal cancer, invasive adenocarcinoma confirmed at colonoscopy 05/22/2015, EUS staging 06/01/2015 revealed a uT3,N1 lesion at 7 cm from the anal verge  Staging CT abdomen/pelvis 05/15/2015 confirmed a mass at the rectosigmoid colon, a suspicious perirectal lymph node, and a 6 mm right liver lesion felt to most likely be a cyst  CT chest 05/30/2015 with indeterminate pulmonary nodules and  multiple liver lesions suspicious for metastases  Ultrasound 06/13/2014 with no liver lesions identified  MRI abdomen 06/21/2015 consistent with multiple liver metastases  CT biopsy of liver lesion 06/26/2015. Pathology showed metastatic adenocarcinoma consistent with colonic primary.MSI-stable, low mutation burden, no RAS or BRAFmutation  Cycle 1 FOLFOX 07/10/2015  Cycle 2 FOLFOX plus Avastin 07/24/2015 with Neulasta support  Cycle 3 FOLFOX plus Avastin 08/07/2015  Cycle 4 FOLFOX plus Avastin 08/21/2015  cycle 5 FOLFOX plus Avastin 09/04/2015  Restaging CTs 09/15/2015 revealed a decrease in the size of liver lesions, decreased rectal primary, stable/decreased size of tiny lung nodules  Cycle 6 FOLFOX plus Avastin 09/18/2015  Cycle 7 FOLFOX plus Avastin 10/02/2015  Cycle 8 FOLFOX plus Avastin 10/16/2015  Cycle 9 FOLFOX plus Avastin 11/06/2015  Cycle 10 FOLFOX plus Avastin 11/20/2015  CTs 12/07/2015-mild decrease in wall thickening at the rectum, decrease in tiny hepatic metastases, stable lung nodules, no evidence of progressive metastatic disease, new left lower lobe infiltrate  Treatment changed to every three-week Avastin with every other week Xeloda beginning 12/11/2015  Restaging CTs 04/12/2016-interval increase in size of adjacent right upper lobe pulmonary nodules and right lower lobe pulmonary nodule; unchanged 5 mm lesion right hepatic lobe; no additional visualized hepatic metastasis. Grossly unchanged wall thickening of the rectum.  Continuation of Xeloda every other week and every three-week AvastinXeloda placed on hold 05/06/2016 due to hand-foot syndrome  Xeloda resumed 05/28/2016 1500 mg every  morning and 1000 mg every afternoon 7 days on/7 days off  CT 09/27/2016-enlargement of lung nodules and new liver lesions, stable rectal mass  Cycle 1 FOLFIRI/panitumumab 10/08/2016  Cycle 2 panitumumab 10/23/2016 (FOLFIRI held secondary to neutropenia)  Cycle 3  FOLFIRI/PANITUMUMAB 11/04/2016  Cycle 4 FOLFIRI/panitumumab 11/18/2016  Cycle 5 FOLFIRI/panitumumab 12/16/2016  Cycle 6 FOLFIRI/panitumumab 12/31/2016  CTs 01/16/2017-decreased size of pulmonary nodules and liver lesions. Decreased perirectal lymph node  Cycle 7 FOLFIRI/PANITUMUMAB 01/28/2017  Cycle 8FOLFIRI/PANITUMUMAB 02/10/2017  Cycle 9 FOLFIRI/PANITUMUMAB 02/24/2017  Cycle 10 FOLFIRI/panitumumab 03/10/2017 (irinotecan held secondary to neutropenia)  Cycle 11 FOLFIRI/panitumumab 03/24/2017  Cycle12 FOLFIRI/Panitumumab 04/07/2017  Restaging CTs 04/25/2017-stable rectal mass with adjacent 8 mm left perirectal nodal metastasis. Prior hepatic metastases not visualized on the current study. Indeterminate 16 mm enhancing lesion present in segment 6, indeterminate. Minimal residual nodularity in the right upper lobe. No new/progressive pulmonary metastases.  Maintenance Xeloda/panitumumab 04/28/2017  Restaging CTs 08/18/2017-new and enlarging bilateral lung nodules; 2 hyperattenuating lesions in the liver appear similar to prior study; progression of wall thickening in the rectum with enlarging perirectal lymph nodes.  Initiation of radiation/Xeloda 09/01/2017, completed 09/22/2017  Cycle 1 FOLFIRI/Panitumumab 10/13/2017  Cycle 2 FOLFIRI 11/03/2017  Cycle 3 FOLFIRI 11/17/2017  Cycle 4 FOLFIRI 12/01/2017  Cycle 5 FOLFIRI 12/15/2017  CTs7/26/2019-mild progression lungs and liver; significantly improved rectal wall thickening; resolution of perirectal nodal adenopathy.  Cycle 6 FOLFIRI 12/29/2017  Cycle 7 FOLFIRI 01/12/2018  Cycle 8 FOLFIRI 01/26/2018  Cycle 9 FOLFIRI 02/09/2018 2.Multiple colon polyps on the colonoscopy 05/22/2015, tubular villous adenoma and a tubular adenoma were removed 3.Anemia-likely secondary to rectal bleeding.  4.Family history of multiple cancers including breast and prostate cancer 5.Transient right arm numbness 06/20/2015 6. Port-A-Cath placement  07/06/2015 by Dr. Marcello Moores 7. Mild neutropenia secondary to chemotherapy following cycle 1 FOLFOX. Neulasta added with cycle 2. 8. Delayed nausea following chemotherapy-emend added with cycle 3 FOLFOX;Emend added with cycle 12 FOLFIRI. 9. Early oxaliplatin neuropathy 10. 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 Xeloda resumed at a reduced dose. 11. Bilateral hand weakness. Question related to previous oxaliplatin. Referred to neurology 06/17/2016. 12. Hypertension-amlodipine started 07/08/2016 13. Rash secondary to PANITUMUMAB.  14. Neutropenia following cycle 1 FOLFIRI/PANITUMUMAB; neutropenia following cycle 4 FOLFIRI/PANITUMUMAB.    Disposition: Hunter Walker appears unchanged.  He will complete another cycle of FOLFIRI today.  He will return for an office visit and chemotherapy in 2 weeks.  He will be scheduled for restaging CTs after the next cycle of chemotherapy.  Betsy Coder, MD  02/09/2018  11:06 AM

## 2018-02-09 NOTE — Telephone Encounter (Signed)
Scheduled appt per 9/9 los - gave patient AVS and calender per los.   

## 2018-02-11 ENCOUNTER — Inpatient Hospital Stay: Payer: Medicare HMO

## 2018-02-11 VITALS — BP 132/78 | HR 68 | Temp 98.6°F | Resp 17

## 2018-02-11 DIAGNOSIS — C20 Malignant neoplasm of rectum: Secondary | ICD-10-CM

## 2018-02-11 DIAGNOSIS — C787 Secondary malignant neoplasm of liver and intrahepatic bile duct: Secondary | ICD-10-CM | POA: Diagnosis not present

## 2018-02-11 DIAGNOSIS — G620 Drug-induced polyneuropathy: Secondary | ICD-10-CM | POA: Diagnosis not present

## 2018-02-11 DIAGNOSIS — Z5189 Encounter for other specified aftercare: Secondary | ICD-10-CM | POA: Diagnosis not present

## 2018-02-11 DIAGNOSIS — I10 Essential (primary) hypertension: Secondary | ICD-10-CM | POA: Diagnosis not present

## 2018-02-11 DIAGNOSIS — Z5111 Encounter for antineoplastic chemotherapy: Secondary | ICD-10-CM | POA: Diagnosis not present

## 2018-02-11 DIAGNOSIS — Z803 Family history of malignant neoplasm of breast: Secondary | ICD-10-CM | POA: Diagnosis not present

## 2018-02-11 DIAGNOSIS — Z8042 Family history of malignant neoplasm of prostate: Secondary | ICD-10-CM | POA: Diagnosis not present

## 2018-02-11 MED ORDER — PEGFILGRASTIM-CBQV 6 MG/0.6ML ~~LOC~~ SOSY
PREFILLED_SYRINGE | SUBCUTANEOUS | Status: AC
  Filled 2018-02-11: qty 0.6

## 2018-02-11 MED ORDER — SODIUM CHLORIDE 0.9% FLUSH
10.0000 mL | INTRAVENOUS | Status: DC | PRN
  Administered 2018-02-11: 10 mL
  Filled 2018-02-11: qty 10

## 2018-02-11 MED ORDER — HEPARIN SOD (PORK) LOCK FLUSH 100 UNIT/ML IV SOLN
500.0000 [IU] | Freq: Once | INTRAVENOUS | Status: AC | PRN
  Administered 2018-02-11: 500 [IU]
  Filled 2018-02-11: qty 5

## 2018-02-11 MED ORDER — PEGFILGRASTIM-CBQV 6 MG/0.6ML ~~LOC~~ SOSY
6.0000 mg | PREFILLED_SYRINGE | Freq: Once | SUBCUTANEOUS | Status: AC
  Administered 2018-02-11: 6 mg via SUBCUTANEOUS

## 2018-02-18 ENCOUNTER — Other Ambulatory Visit: Payer: Self-pay | Admitting: Oncology

## 2018-02-23 ENCOUNTER — Inpatient Hospital Stay: Payer: Medicare HMO

## 2018-02-23 ENCOUNTER — Encounter: Payer: Self-pay | Admitting: Nurse Practitioner

## 2018-02-23 ENCOUNTER — Telehealth: Payer: Self-pay

## 2018-02-23 ENCOUNTER — Inpatient Hospital Stay (HOSPITAL_BASED_OUTPATIENT_CLINIC_OR_DEPARTMENT_OTHER): Payer: Medicare HMO | Admitting: Nurse Practitioner

## 2018-02-23 VITALS — BP 118/82 | HR 71 | Temp 98.4°F | Resp 17 | Ht 72.0 in | Wt 184.1 lb

## 2018-02-23 DIAGNOSIS — Z803 Family history of malignant neoplasm of breast: Secondary | ICD-10-CM | POA: Diagnosis not present

## 2018-02-23 DIAGNOSIS — C20 Malignant neoplasm of rectum: Secondary | ICD-10-CM

## 2018-02-23 DIAGNOSIS — Z5189 Encounter for other specified aftercare: Secondary | ICD-10-CM | POA: Diagnosis not present

## 2018-02-23 DIAGNOSIS — I10 Essential (primary) hypertension: Secondary | ICD-10-CM

## 2018-02-23 DIAGNOSIS — C787 Secondary malignant neoplasm of liver and intrahepatic bile duct: Secondary | ICD-10-CM | POA: Diagnosis not present

## 2018-02-23 DIAGNOSIS — G620 Drug-induced polyneuropathy: Secondary | ICD-10-CM | POA: Diagnosis not present

## 2018-02-23 DIAGNOSIS — Z5111 Encounter for antineoplastic chemotherapy: Secondary | ICD-10-CM | POA: Diagnosis not present

## 2018-02-23 DIAGNOSIS — Z8042 Family history of malignant neoplasm of prostate: Secondary | ICD-10-CM | POA: Diagnosis not present

## 2018-02-23 LAB — CBC WITH DIFFERENTIAL (CANCER CENTER ONLY)
BASOS ABS: 0 10*3/uL (ref 0.0–0.1)
BASOS PCT: 1 %
EOS PCT: 1 %
Eosinophils Absolute: 0.1 10*3/uL (ref 0.0–0.5)
HEMATOCRIT: 42.2 % (ref 38.4–49.9)
Hemoglobin: 13.8 g/dL (ref 13.0–17.1)
LYMPHS PCT: 13 %
Lymphs Abs: 0.8 10*3/uL — ABNORMAL LOW (ref 0.9–3.3)
MCH: 32.3 pg (ref 27.2–33.4)
MCHC: 32.7 g/dL (ref 32.0–36.0)
MCV: 99 fL — AB (ref 79.3–98.0)
MONO ABS: 0.6 10*3/uL (ref 0.1–0.9)
MONOS PCT: 9 %
NEUTROS ABS: 5 10*3/uL (ref 1.5–6.5)
Neutrophils Relative %: 76 %
PLATELETS: 222 10*3/uL (ref 140–400)
RBC: 4.26 MIL/uL (ref 4.20–5.82)
RDW: 17 % — ABNORMAL HIGH (ref 11.0–14.6)
WBC Count: 6.5 10*3/uL (ref 4.0–10.3)

## 2018-02-23 LAB — CMP (CANCER CENTER ONLY)
ALT: 11 U/L (ref 0–44)
ANION GAP: 7 (ref 5–15)
AST: 16 U/L (ref 15–41)
Albumin: 3.6 g/dL (ref 3.5–5.0)
Alkaline Phosphatase: 149 U/L — ABNORMAL HIGH (ref 38–126)
BILIRUBIN TOTAL: 0.3 mg/dL (ref 0.3–1.2)
BUN: 12 mg/dL (ref 8–23)
CHLORIDE: 108 mmol/L (ref 98–111)
CO2: 26 mmol/L (ref 22–32)
Calcium: 9 mg/dL (ref 8.9–10.3)
Creatinine: 1.25 mg/dL — ABNORMAL HIGH (ref 0.61–1.24)
GFR, EST NON AFRICAN AMERICAN: 58 mL/min — AB (ref >60)
Glucose, Bld: 104 mg/dL — ABNORMAL HIGH (ref 70–99)
POTASSIUM: 4.1 mmol/L (ref 3.5–5.1)
Sodium: 141 mmol/L (ref 135–145)
TOTAL PROTEIN: 7.6 g/dL (ref 6.5–8.1)

## 2018-02-23 LAB — CEA (IN HOUSE-CHCC): CEA (CHCC-IN HOUSE): 4.31 ng/mL (ref 0.00–5.00)

## 2018-02-23 MED ORDER — FLUOROURACIL CHEMO INJECTION 2.5 GM/50ML
300.0000 mg/m2 | Freq: Once | INTRAVENOUS | Status: AC
  Administered 2018-02-23: 600 mg via INTRAVENOUS
  Filled 2018-02-23: qty 12

## 2018-02-23 MED ORDER — SODIUM CHLORIDE 0.9 % IV SOLN
2410.0000 mg/m2 | INTRAVENOUS | Status: DC
  Administered 2018-02-23: 5000 mg via INTRAVENOUS
  Filled 2018-02-23: qty 100

## 2018-02-23 MED ORDER — DEXAMETHASONE SODIUM PHOSPHATE 10 MG/ML IJ SOLN
INTRAMUSCULAR | Status: AC
  Filled 2018-02-23: qty 1

## 2018-02-23 MED ORDER — PALONOSETRON HCL INJECTION 0.25 MG/5ML
INTRAVENOUS | Status: AC
  Filled 2018-02-23: qty 5

## 2018-02-23 MED ORDER — SODIUM CHLORIDE 0.9 % IV SOLN
Freq: Once | INTRAVENOUS | Status: AC
  Administered 2018-02-23: 12:00:00 via INTRAVENOUS
  Filled 2018-02-23: qty 250

## 2018-02-23 MED ORDER — ATROPINE SULFATE 1 MG/ML IJ SOLN
INTRAMUSCULAR | Status: AC
  Filled 2018-02-23: qty 1

## 2018-02-23 MED ORDER — PROCHLORPERAZINE MALEATE 10 MG PO TABS: ORAL_TABLET | ORAL | 2 refills | Status: DC

## 2018-02-23 MED ORDER — PALONOSETRON HCL INJECTION 0.25 MG/5ML
0.2500 mg | Freq: Once | INTRAVENOUS | Status: AC
  Administered 2018-02-23: 0.25 mg via INTRAVENOUS

## 2018-02-23 MED ORDER — LEUCOVORIN CALCIUM INJECTION 350 MG
300.0000 mg/m2 | Freq: Once | INTRAVENOUS | Status: AC
  Administered 2018-02-23: 622 mg via INTRAVENOUS
  Filled 2018-02-23: qty 31.1

## 2018-02-23 MED ORDER — IRINOTECAN HCL CHEMO INJECTION 100 MG/5ML
125.0000 mg/m2 | Freq: Once | INTRAVENOUS | Status: AC
  Administered 2018-02-23: 260 mg via INTRAVENOUS
  Filled 2018-02-23: qty 13

## 2018-02-23 MED ORDER — DEXAMETHASONE SODIUM PHOSPHATE 10 MG/ML IJ SOLN
10.0000 mg | Freq: Once | INTRAMUSCULAR | Status: AC
  Administered 2018-02-23: 10 mg via INTRAVENOUS

## 2018-02-23 MED ORDER — ATROPINE SULFATE 1 MG/ML IJ SOLN
0.5000 mg | Freq: Once | INTRAMUSCULAR | Status: AC | PRN
  Administered 2018-02-23: 0.5 mg via INTRAVENOUS

## 2018-02-23 NOTE — Telephone Encounter (Signed)
Patient called while in treatment area. Per 9/23 los printed avs and calender of upcoming appointment that was added to his current scheduled. Doc. Taken to patient in infusion

## 2018-02-23 NOTE — Patient Instructions (Signed)
Implanted Port Home Guide An implanted port is a type of central line that is placed under the skin. Central lines are used to provide IV access when treatment or nutrition needs to be given through a person's veins. Implanted ports are used for long-term IV access. An implanted port may be placed because:  You need IV medicine that would be irritating to the small veins in your hands or arms.  You need long-term IV medicines, such as antibiotics.  You need IV nutrition for a long period.  You need frequent blood draws for lab tests.  You need dialysis.  Implanted ports are usually placed in the chest area, but they can also be placed in the upper arm, the abdomen, or the leg. An implanted port has two main parts:  Reservoir. The reservoir is round and will appear as a small, raised area under your skin. The reservoir is the part where a needle is inserted to give medicines or draw blood.  Catheter. The catheter is a thin, flexible tube that extends from the reservoir. The catheter is placed into a large vein. Medicine that is inserted into the reservoir goes into the catheter and then into the vein.  How will I care for my incision site? Do not get the incision site wet. Bathe or shower as directed by your health care provider. How is my port accessed? Special steps must be taken to access the port:  Before the port is accessed, a numbing cream can be placed on the skin. This helps numb the skin over the port site.  Your health care provider uses a sterile technique to access the port. ? Your health care provider must put on a mask and sterile gloves. ? The skin over your port is cleaned carefully with an antiseptic and allowed to dry. ? The port is gently pinched between sterile gloves, and a needle is inserted into the port.  Only "non-coring" port needles should be used to access the port. Once the port is accessed, a blood return should be checked. This helps ensure that the port  is in the vein and is not clogged.  If your port needs to remain accessed for a constant infusion, a clear (transparent) bandage will be placed over the needle site. The bandage and needle will need to be changed every week, or as directed by your health care provider.  Keep the bandage covering the needle clean and dry. Do not get it wet. Follow your health care provider's instructions on how to take a shower or bath while the port is accessed.  If your port does not need to stay accessed, no bandage is needed over the port.  What is flushing? Flushing helps keep the port from getting clogged. Follow your health care provider's instructions on how and when to flush the port. Ports are usually flushed with saline solution or a medicine called heparin. The need for flushing will depend on how the port is used.  If the port is used for intermittent medicines or blood draws, the port will need to be flushed: ? After medicines have been given. ? After blood has been drawn. ? As part of routine maintenance.  If a constant infusion is running, the port may not need to be flushed.  How long will my port stay implanted? The port can stay in for as long as your health care provider thinks it is needed. When it is time for the port to come out, surgery will be   done to remove it. The procedure is similar to the one performed when the port was put in. When should I seek immediate medical care? When you have an implanted port, you should seek immediate medical care if:  You notice a bad smell coming from the incision site.  You have swelling, redness, or drainage at the incision site.  You have more swelling or pain at the port site or the surrounding area.  You have a fever that is not controlled with medicine.  This information is not intended to replace advice given to you by your health care provider. Make sure you discuss any questions you have with your health care provider. Document  Released: 05/20/2005 Document Revised: 10/26/2015 Document Reviewed: 01/25/2013 Elsevier Interactive Patient Education  2017 [employer]  

## 2018-02-23 NOTE — Patient Instructions (Signed)
[facility] Discharge Instructions for Patients Receiving Chemotherapy  Today you received the following chemotherapy agents Oxaliplatin, Leucovorin, 5FU  To help prevent nausea and vomiting after your treatment, we encourage you to take your nausea medication as directed   If you develop nausea and vomiting that is not controlled by your nausea medication, call the clinic.   BELOW ARE SYMPTOMS THAT SHOULD BE REPORTED IMMEDIATELY:  *FEVER GREATER THAN 100.5 F  *CHILLS WITH OR WITHOUT FEVER  NAUSEA AND VOMITING THAT IS NOT CONTROLLED WITH YOUR NAUSEA MEDICATION  *UNUSUAL SHORTNESS OF BREATH  *UNUSUAL BRUISING OR BLEEDING  TENDERNESS IN MOUTH AND THROAT WITH OR WITHOUT PRESENCE OF ULCERS  *URINARY PROBLEMS  *BOWEL PROBLEMS  UNUSUAL RASH Items with * indicate a potential emergency and should be followed up as soon as possible.  Feel free to call the clinic should you have any questions or concerns. The clinic phone number is [phone].  Please show the CHEMO ALERT CARD at check-in to the Emergency Department and triage nurse.   

## 2018-02-23 NOTE — Telephone Encounter (Signed)
Printed avs and calender of upcoming appointment. Per 9/23 los 

## 2018-02-23 NOTE — Progress Notes (Signed)
[place] OFFICE PROGRESS NOTE   Diagnosis: Rectal cancer  INTERVAL HISTORY:   Hunter Walker returns as scheduled.  He completed cycle 9 FOLFIRI 02/09/2018.  He denies nausea/vomiting.  No mouth sores.  No diarrhea.  He noted some aching in his legs and wonders if this is related to the Neulasta injection.  Overall good appetite.  Energy level is stable.  Objective:  Vital signs in last 24 hours:  Blood pressure 118/82, pulse 71, temperature 98.4 F (36.9 C), temperature source Oral, resp. rate 17, height 6' (1.829 m), weight 184 lb 1.6 oz (83.5 kg), SpO2 100 %.    HEENT: No thrush or ulcers. Resp: Lungs clear bilaterally. Cardio: Regular rate and rhythm. GI: Abdomen soft and nontender.  No hepatomegaly. Vascular: No leg edema.  Skin: Palms without erythema. Port-A-Cath without erythema.   Lab Results:  Lab Results  Component Value Date   WBC 6.5 02/23/2018   HGB 13.8 02/23/2018   HCT 42.2 02/23/2018   MCV 99.0 (H) 02/23/2018   PLT 222 02/23/2018   NEUTROABS 5.0 02/23/2018    Imaging:  No results found.  Medications: I have reviewed the patient's current medications.  Assessment/Plan: 1. Rectal cancer, invasive adenocarcinoma confirmed at colonoscopy 05/22/2015, EUS staging 06/01/2015 revealed a uT3,N1 lesion at 7 cm from the anal verge  Staging CT abdomen/pelvis 05/15/2015 confirmed a mass at the rectosigmoid colon, a suspicious perirectal lymph node, and a 6 mm right liver lesion felt to most likely be a cyst  CT chest 05/30/2015 with indeterminate pulmonary nodules and multiple liver lesions suspicious for metastases  Ultrasound 06/13/2014 with no liver lesions identified  MRI abdomen 06/21/2015 consistent with multiple liver metastases  CT biopsy of liver lesion 06/26/2015. Pathology showed metastatic adenocarcinoma consistent with colonic primary.MSI-stable, low mutation burden, no RAS or BRAFmutation  Cycle 1 FOLFOX 07/10/2015  Cycle  2 FOLFOX plus Avastin 07/24/2015 with Neulasta support  Cycle 3 FOLFOX plus Avastin 08/07/2015  Cycle 4 FOLFOX plus Avastin 08/21/2015  cycle 5 FOLFOX plus Avastin 09/04/2015  Restaging CTs 09/15/2015 revealed a decrease in the size of liver lesions, decreased rectal primary, stable/decreased size of tiny lung nodules  Cycle 6 FOLFOX plus Avastin 09/18/2015  Cycle 7 FOLFOX plus Avastin 10/02/2015  Cycle 8 FOLFOX plus Avastin 10/16/2015  Cycle 9 FOLFOX plus Avastin 11/06/2015  Cycle 10 FOLFOX plus Avastin 11/20/2015  CTs 12/07/2015-mild decrease in wall thickening at the rectum, decrease in tiny hepatic metastases, stable lung nodules, no evidence of progressive metastatic disease, new left lower lobe infiltrate  Treatment changed to every three-week Avastin with every other week Xeloda beginning 12/11/2015  Restaging CTs 04/12/2016-interval increase in size of adjacent right upper lobe pulmonary nodules and right lower lobe pulmonary nodule; unchanged 5 mm lesion right hepatic lobe; no additional visualized hepatic metastasis. Grossly unchanged wall thickening of the rectum.  Continuation of Xeloda every other week and every three-week AvastinXeloda placed on hold 05/06/2016 due to hand-foot syndrome  Xeloda resumed 05/28/2016 1500 mg every morning and 1000 mg every afternoon 7 days on/7 days off  CT 09/27/2016-enlargement of lung nodules and new liver lesions, stable rectal mass  Cycle 1 FOLFIRI/panitumumab 10/08/2016  Cycle 2 panitumumab 10/23/2016 (FOLFIRI held secondary to neutropenia)  Cycle 3 FOLFIRI/PANITUMUMAB 11/04/2016  Cycle 4 FOLFIRI/panitumumab 11/18/2016  Cycle 5 FOLFIRI/panitumumab 12/16/2016  Cycle 6 FOLFIRI/panitumumab 12/31/2016  CTs 01/16/2017-decreased size of pulmonary nodules and liver lesions. Decreased perirectal lymph node  Cycle 7 FOLFIRI/PANITUMUMAB 01/28/2017  Cycle 8FOLFIRI/PANITUMUMAB 02/10/2017  Cycle 9  FOLFIRI/PANITUMUMAB  02/24/2017  Cycle 10 FOLFIRI/panitumumab 03/10/2017 (irinotecan held secondary to neutropenia)  Cycle 11 FOLFIRI/panitumumab 03/24/2017  Cycle12 FOLFIRI/Panitumumab 04/07/2017  Restaging CTs 04/25/2017-stable rectal mass with adjacent 8 mm left perirectal nodal metastasis. Prior hepatic metastases not visualized on the current study. Indeterminate 16 mm enhancing lesion present in segment 6, indeterminate. Minimal residual nodularity in the right upper lobe. No new/progressive pulmonary metastases.  Maintenance Xeloda/panitumumab 04/28/2017  Restaging CTs 08/18/2017-new and enlarging bilateral lung nodules; 2 hyperattenuating lesions in the liver appear similar to prior study; progression of wall thickening in the rectum with enlarging perirectal lymph nodes.  Initiation of radiation/Xeloda 09/01/2017, completed 09/22/2017  Cycle 1 FOLFIRI/Panitumumab 10/13/2017  Cycle 2 FOLFIRI 11/03/2017  Cycle 3 FOLFIRI 11/17/2017  Cycle 4 FOLFIRI 12/01/2017  Cycle 5 FOLFIRI 12/15/2017  CTs7/26/2019-mild progression lungs and liver; significantly improved rectal wall thickening; resolution of perirectal nodal adenopathy.  Cycle 6 FOLFIRI 12/29/2017  Cycle 7 FOLFIRI 01/12/2018  Cycle 8 FOLFIRI 01/26/2018  Cycle 9 FOLFIRI 02/09/2018  Cycle 10 FOLFIRI 02/23/2018 2.Multiple colon polyps on the colonoscopy 05/22/2015, tubular villous adenoma and a tubular adenoma were removed 3.Anemia-likely secondary to rectal bleeding.  4.Family history of multiple cancers including breast and prostate cancer 5.Transient right arm numbness 06/20/2015 6. Port-A-Cath placement 07/06/2015 by Dr. Marcello Moores 7. Mild neutropenia secondary to chemotherapy following cycle 1 FOLFOX. Neulasta added with cycle 2. 8. Delayed nausea following chemotherapy-emend added with cycle 3 FOLFOX;Emend added with cycle 12 FOLFIRI. 9. Early oxaliplatin neuropathy 10. 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  Xeloda resumed at a reduced dose. 11. Bilateral hand weakness. Question related to previous oxaliplatin. Referred to neurology 06/17/2016. 12. Hypertension-amlodipine started 07/08/2016 13. Rash secondary to PANITUMUMAB.  14. Neutropenia following cycle 1 FOLFIRI/PANITUMUMAB; neutropenia following cycle 4 FOLFIRI/PANITUMUMAB.   Disposition: Hunter Walker appears stable.  He has completed 9 cycles of FOLFIRI.  Plan to proceed with cycle 10 today as scheduled.  We reviewed the CBC from today.  Counts are adequate for treatment.  He will undergo restaging CTs 03/06/2018.  We will see him in follow-up on 03/09/2018.  He will contact the office in the interim with any problems.    Ned Card ANP/GNP-BC   02/23/2018  11:33 AM

## 2018-02-25 ENCOUNTER — Inpatient Hospital Stay: Payer: Medicare HMO

## 2018-02-25 VITALS — BP 118/78 | HR 72 | Temp 98.6°F | Resp 18

## 2018-02-25 DIAGNOSIS — C20 Malignant neoplasm of rectum: Secondary | ICD-10-CM | POA: Diagnosis not present

## 2018-02-25 DIAGNOSIS — Z8042 Family history of malignant neoplasm of prostate: Secondary | ICD-10-CM | POA: Diagnosis not present

## 2018-02-25 DIAGNOSIS — C787 Secondary malignant neoplasm of liver and intrahepatic bile duct: Secondary | ICD-10-CM | POA: Diagnosis not present

## 2018-02-25 DIAGNOSIS — Z5189 Encounter for other specified aftercare: Secondary | ICD-10-CM | POA: Diagnosis not present

## 2018-02-25 DIAGNOSIS — Z803 Family history of malignant neoplasm of breast: Secondary | ICD-10-CM | POA: Diagnosis not present

## 2018-02-25 DIAGNOSIS — I10 Essential (primary) hypertension: Secondary | ICD-10-CM | POA: Diagnosis not present

## 2018-02-25 DIAGNOSIS — G620 Drug-induced polyneuropathy: Secondary | ICD-10-CM | POA: Diagnosis not present

## 2018-02-25 DIAGNOSIS — Z5111 Encounter for antineoplastic chemotherapy: Secondary | ICD-10-CM | POA: Diagnosis not present

## 2018-02-25 MED ORDER — SODIUM CHLORIDE 0.9% FLUSH
10.0000 mL | INTRAVENOUS | Status: DC | PRN
  Administered 2018-02-25: 10 mL
  Filled 2018-02-25: qty 10

## 2018-02-25 MED ORDER — PEGFILGRASTIM-CBQV 6 MG/0.6ML ~~LOC~~ SOSY
6.0000 mg | PREFILLED_SYRINGE | Freq: Once | SUBCUTANEOUS | Status: AC
  Administered 2018-02-25: 6 mg via SUBCUTANEOUS

## 2018-02-25 MED ORDER — HEPARIN SOD (PORK) LOCK FLUSH 100 UNIT/ML IV SOLN
500.0000 [IU] | Freq: Once | INTRAVENOUS | Status: AC | PRN
  Administered 2018-02-25: 500 [IU]
  Filled 2018-02-25: qty 5

## 2018-03-06 ENCOUNTER — Ambulatory Visit (HOSPITAL_COMMUNITY)
Admission: RE | Admit: 2018-03-06 | Discharge: 2018-03-06 | Disposition: A | Discharge status: Home / Self Care | Payer: Medicare HMO | Source: Ambulatory Visit | Attending: Oncology | Admitting: Oncology

## 2018-03-06 DIAGNOSIS — R918 Other nonspecific abnormal finding of lung field: Secondary | ICD-10-CM | POA: Diagnosis not present

## 2018-03-06 DIAGNOSIS — Z85048 Personal history of other malignant neoplasm of rectum, rectosigmoid junction, and anus: Secondary | ICD-10-CM | POA: Diagnosis not present

## 2018-03-06 DIAGNOSIS — C20 Malignant neoplasm of rectum: Secondary | ICD-10-CM | POA: Diagnosis not present

## 2018-03-06 MED ORDER — SODIUM CHLORIDE 0.9 % IJ SOLN
INTRAMUSCULAR | Status: AC
  Filled 2018-03-06: qty 50

## 2018-03-06 MED ORDER — IOHEXOL 300 MG/ML  SOLN
100.0000 mL | Freq: Once | INTRAMUSCULAR | Status: AC | PRN
  Administered 2018-03-06: 100 mL via INTRAVENOUS

## 2018-03-07 ENCOUNTER — Other Ambulatory Visit: Payer: Self-pay | Admitting: Oncology

## 2018-03-09 ENCOUNTER — Inpatient Hospital Stay (HOSPITAL_BASED_OUTPATIENT_CLINIC_OR_DEPARTMENT_OTHER): Payer: Medicare HMO | Admitting: Nurse Practitioner

## 2018-03-09 ENCOUNTER — Inpatient Hospital Stay: Payer: Medicare HMO | Attending: Oncology

## 2018-03-09 ENCOUNTER — Inpatient Hospital Stay: Payer: Medicare HMO

## 2018-03-09 ENCOUNTER — Encounter: Payer: Self-pay | Admitting: Nurse Practitioner

## 2018-03-09 ENCOUNTER — Telehealth: Payer: Self-pay

## 2018-03-09 VITALS — BP 129/81 | HR 67 | Temp 98.0°F | Resp 17 | Ht 72.0 in | Wt 186.4 lb

## 2018-03-09 DIAGNOSIS — Z8042 Family history of malignant neoplasm of prostate: Secondary | ICD-10-CM

## 2018-03-09 DIAGNOSIS — C7800 Secondary malignant neoplasm of unspecified lung: Secondary | ICD-10-CM

## 2018-03-09 DIAGNOSIS — Z23 Encounter for immunization: Secondary | ICD-10-CM | POA: Insufficient documentation

## 2018-03-09 DIAGNOSIS — D701 Agranulocytosis secondary to cancer chemotherapy: Secondary | ICD-10-CM | POA: Insufficient documentation

## 2018-03-09 DIAGNOSIS — D649 Anemia, unspecified: Secondary | ICD-10-CM | POA: Insufficient documentation

## 2018-03-09 DIAGNOSIS — C20 Malignant neoplasm of rectum: Secondary | ICD-10-CM

## 2018-03-09 DIAGNOSIS — G620 Drug-induced polyneuropathy: Secondary | ICD-10-CM | POA: Diagnosis not present

## 2018-03-09 DIAGNOSIS — Z803 Family history of malignant neoplasm of breast: Secondary | ICD-10-CM | POA: Insufficient documentation

## 2018-03-09 DIAGNOSIS — C787 Secondary malignant neoplasm of liver and intrahepatic bile duct: Secondary | ICD-10-CM

## 2018-03-09 DIAGNOSIS — Z5111 Encounter for antineoplastic chemotherapy: Secondary | ICD-10-CM | POA: Insufficient documentation

## 2018-03-09 DIAGNOSIS — Z5189 Encounter for other specified aftercare: Secondary | ICD-10-CM | POA: Diagnosis not present

## 2018-03-09 DIAGNOSIS — Z923 Personal history of irradiation: Secondary | ICD-10-CM | POA: Diagnosis not present

## 2018-03-09 DIAGNOSIS — I10 Essential (primary) hypertension: Secondary | ICD-10-CM | POA: Diagnosis not present

## 2018-03-09 DIAGNOSIS — Z95828 Presence of other vascular implants and grafts: Secondary | ICD-10-CM

## 2018-03-09 LAB — CMP (CANCER CENTER ONLY)
ALT: 14 U/L (ref 0–44)
ANION GAP: 6 (ref 5–15)
AST: 17 U/L (ref 15–41)
Albumin: 3.6 g/dL (ref 3.5–5.0)
Alkaline Phosphatase: 149 U/L — ABNORMAL HIGH (ref 38–126)
BUN: 11 mg/dL (ref 8–23)
CHLORIDE: 108 mmol/L (ref 98–111)
CO2: 28 mmol/L (ref 22–32)
Calcium: 9 mg/dL (ref 8.9–10.3)
Creatinine: 1.2 mg/dL (ref 0.61–1.24)
GFR, Estimated: >60 mL/min (ref >60)
Glucose, Bld: 115 mg/dL — ABNORMAL HIGH (ref 70–99)
POTASSIUM: 3.8 mmol/L (ref 3.5–5.1)
Sodium: 142 mmol/L (ref 135–145)
TOTAL PROTEIN: 7.2 g/dL (ref 6.5–8.1)
Total Bilirubin: 0.4 mg/dL (ref 0.3–1.2)

## 2018-03-09 LAB — CBC WITH DIFFERENTIAL (CANCER CENTER ONLY)
BASOS ABS: 0 10*3/uL (ref 0.0–0.1)
Basophils Relative: 1 %
EOS PCT: 2 %
Eosinophils Absolute: 0.1 10*3/uL (ref 0.0–0.5)
HCT: 40.4 % (ref 38.4–49.9)
Hemoglobin: 13.3 g/dL (ref 13.0–17.1)
LYMPHS ABS: 1 10*3/uL (ref 0.9–3.3)
LYMPHS PCT: 14 %
MCH: 32.9 pg (ref 27.2–33.4)
MCHC: 33 g/dL (ref 32.0–36.0)
MCV: 99.5 fL — ABNORMAL HIGH (ref 79.3–98.0)
MONO ABS: 0.7 10*3/uL (ref 0.1–0.9)
MONOS PCT: 9 %
Neutro Abs: 5.3 10*3/uL (ref 1.5–6.5)
Neutrophils Relative %: 74 %
PLATELETS: 227 10*3/uL (ref 140–400)
RBC: 4.06 MIL/uL — ABNORMAL LOW (ref 4.20–5.82)
RDW: 17.5 % — AB (ref 11.0–14.6)
WBC Count: 7.1 10*3/uL (ref 4.0–10.3)

## 2018-03-09 MED ORDER — SODIUM CHLORIDE 0.9 % IJ SOLN
10.0000 mL | INTRAMUSCULAR | Status: DC | PRN
  Administered 2018-03-09: 10 mL via INTRAVENOUS
  Filled 2018-03-09: qty 10

## 2018-03-09 MED ORDER — PALONOSETRON HCL INJECTION 0.25 MG/5ML
INTRAVENOUS | Status: AC
  Filled 2018-03-09: qty 5

## 2018-03-09 MED ORDER — LEUCOVORIN CALCIUM INJECTION 350 MG
300.0000 mg/m2 | Freq: Once | INTRAVENOUS | Status: AC
  Administered 2018-03-09: 622 mg via INTRAVENOUS
  Filled 2018-03-09: qty 31.1

## 2018-03-09 MED ORDER — SODIUM CHLORIDE 0.9 % IV SOLN
Freq: Once | INTRAVENOUS | Status: AC
  Administered 2018-03-09: 12:00:00 via INTRAVENOUS
  Filled 2018-03-09: qty 250

## 2018-03-09 MED ORDER — IRINOTECAN HCL CHEMO INJECTION 100 MG/5ML
125.0000 mg/m2 | Freq: Once | INTRAVENOUS | Status: AC
  Administered 2018-03-09: 260 mg via INTRAVENOUS
  Filled 2018-03-09: qty 13

## 2018-03-09 MED ORDER — ATROPINE SULFATE 1 MG/ML IJ SOLN
INTRAMUSCULAR | Status: AC
  Filled 2018-03-09: qty 1

## 2018-03-09 MED ORDER — SODIUM CHLORIDE 0.9 % IV SOLN
5000.0000 mg | INTRAVENOUS | Status: DC
  Administered 2018-03-09: 5000 mg via INTRAVENOUS
  Filled 2018-03-09: qty 100

## 2018-03-09 MED ORDER — DEXAMETHASONE SODIUM PHOSPHATE 10 MG/ML IJ SOLN
10.0000 mg | Freq: Once | INTRAMUSCULAR | Status: AC
  Administered 2018-03-09: 10 mg via INTRAVENOUS

## 2018-03-09 MED ORDER — FLUOROURACIL CHEMO INJECTION 2.5 GM/50ML
300.0000 mg/m2 | Freq: Once | INTRAVENOUS | Status: AC
  Administered 2018-03-09: 600 mg via INTRAVENOUS
  Filled 2018-03-09: qty 12

## 2018-03-09 MED ORDER — SODIUM CHLORIDE 0.9 % IV SOLN
Freq: Once | INTRAVENOUS | Status: AC
  Administered 2018-03-09: 13:00:00 via INTRAVENOUS
  Filled 2018-03-09: qty 250

## 2018-03-09 MED ORDER — PALONOSETRON HCL INJECTION 0.25 MG/5ML
0.2500 mg | Freq: Once | INTRAVENOUS | Status: AC
  Administered 2018-03-09: 0.25 mg via INTRAVENOUS

## 2018-03-09 MED ORDER — ATROPINE SULFATE 1 MG/ML IJ SOLN
0.5000 mg | Freq: Once | INTRAMUSCULAR | Status: AC | PRN
  Administered 2018-03-09: 0.5 mg via INTRAVENOUS

## 2018-03-09 MED ORDER — INFLUENZA VAC SPLIT HIGH-DOSE 0.5 ML IM SUSY
0.5000 mL | PREFILLED_SYRINGE | Freq: Once | INTRAMUSCULAR | Status: AC
  Administered 2018-03-09: 0.5 mL via INTRAMUSCULAR
  Filled 2018-03-09: qty 0.5

## 2018-03-09 MED ORDER — DEXAMETHASONE SODIUM PHOSPHATE 10 MG/ML IJ SOLN
INTRAMUSCULAR | Status: AC
  Filled 2018-03-09: qty 1

## 2018-03-09 NOTE — Patient Instructions (Signed)
[facility] Discharge Instructions for Patients Receiving Chemotherapy  Today you received the following chemotherapy agents Oxaliplatin, Leucovorin, 5FU  To help prevent nausea and vomiting after your treatment, we encourage you to take your nausea medication as directed   If you develop nausea and vomiting that is not controlled by your nausea medication, call the clinic.   BELOW ARE SYMPTOMS THAT SHOULD BE REPORTED IMMEDIATELY:  *FEVER GREATER THAN 100.5 F  *CHILLS WITH OR WITHOUT FEVER  NAUSEA AND VOMITING THAT IS NOT CONTROLLED WITH YOUR NAUSEA MEDICATION  *UNUSUAL SHORTNESS OF BREATH  *UNUSUAL BRUISING OR BLEEDING  TENDERNESS IN MOUTH AND THROAT WITH OR WITHOUT PRESENCE OF ULCERS  *URINARY PROBLEMS  *BOWEL PROBLEMS  UNUSUAL RASH Items with * indicate a potential emergency and should be followed up as soon as possible.  Feel free to call the clinic should you have any questions or concerns. The clinic phone number is [phone].  Please show the CHEMO ALERT CARD at check-in to the Emergency Department and triage nurse.   

## 2018-03-09 NOTE — Progress Notes (Addendum)
[facility] OFFICE PROGRESS NOTE   Diagnosis: Rectal cancer  INTERVAL HISTORY:   Hunter Walker returns as scheduled.  He completed cycle 10 FOLFIRI 02/23/2018.  He denies nausea/vomiting.  No mouth sores.  No diarrhea.  He denies pain.  He has a good appetite.  No rash.  No cough or shortness of breath.  Objective:  Vital signs in last 24 hours:  Blood pressure 129/81, pulse 67, temperature 98 F (36.7 C), temperature source Oral, resp. rate 17, height 6' (1.829 m), weight 186 lb 6.4 oz (84.6 kg), SpO2 100 %.    HEENT: No thrush or ulcers. Resp: Lungs clear bilaterally. Cardio: Regular rate and rhythm. GI: Abdomen soft and nontender.  No hepatomegaly. Vascular: No leg edema.  Skin: Palms without erythema. Port-A-Cath without erythema.   Lab Results:  Lab Results  Component Value Date   WBC 7.1 03/09/2018   HGB 13.3 03/09/2018   HCT 40.4 03/09/2018   MCV 99.5 (H) 03/09/2018   PLT 227 03/09/2018   NEUTROABS 5.3 03/09/2018    Imaging:  No results found.  Medications: I have reviewed the patient's current medications.  Assessment/Plan: 1. Rectal cancer, invasive adenocarcinoma confirmed at colonoscopy 05/22/2015, EUS staging 06/01/2015 revealed a uT3,N1 lesion at 7 cm from the anal verge  Staging CT abdomen/pelvis 05/15/2015 confirmed a mass at the rectosigmoid colon, a suspicious perirectal lymph node, and a 6 mm right liver lesion felt to most likely be a cyst  CT chest 05/30/2015 with indeterminate pulmonary nodules and multiple liver lesions suspicious for metastases  Ultrasound 06/13/2014 with no liver lesions identified  MRI abdomen 06/21/2015 consistent with multiple liver metastases  CT biopsy of liver lesion 06/26/2015. Pathology showed metastatic adenocarcinoma consistent with colonic primary.MSI-stable, low mutation burden, no RAS or BRAFmutation  Cycle 1 FOLFOX 07/10/2015  Cycle 2 FOLFOX plus Avastin 07/24/2015 with Neulasta  support  Cycle 3 FOLFOX plus Avastin 08/07/2015  Cycle 4 FOLFOX plus Avastin 08/21/2015  cycle 5 FOLFOX plus Avastin 09/04/2015  Restaging CTs 09/15/2015 revealed a decrease in the size of liver lesions, decreased rectal primary, stable/decreased size of tiny lung nodules  Cycle 6 FOLFOX plus Avastin 09/18/2015  Cycle 7 FOLFOX plus Avastin 10/02/2015  Cycle 8 FOLFOX plus Avastin 10/16/2015  Cycle 9 FOLFOX plus Avastin 11/06/2015  Cycle 10 FOLFOX plus Avastin 11/20/2015  CTs 12/07/2015-mild decrease in wall thickening at the rectum, decrease in tiny hepatic metastases, stable lung nodules, no evidence of progressive metastatic disease, new left lower lobe infiltrate  Treatment changed to every three-week Avastin with every other week Xeloda beginning 12/11/2015  Restaging CTs 04/12/2016-interval increase in size of adjacent right upper lobe pulmonary nodules and right lower lobe pulmonary nodule; unchanged 5 mm lesion right hepatic lobe; no additional visualized hepatic metastasis. Grossly unchanged wall thickening of the rectum.  Continuation of Xeloda every other week and every three-week AvastinXeloda placed on hold 05/06/2016 due to hand-foot syndrome  Xeloda resumed 05/28/2016 1500 mg every morning and 1000 mg every afternoon 7 days on/7 days off  CT 09/27/2016-enlargement of lung nodules and new liver lesions, stable rectal mass  Cycle 1 FOLFIRI/panitumumab 10/08/2016  Cycle 2 panitumumab 10/23/2016 (FOLFIRI held secondary to neutropenia)  Cycle 3 FOLFIRI/PANITUMUMAB 11/04/2016  Cycle 4 FOLFIRI/panitumumab 11/18/2016  Cycle 5 FOLFIRI/panitumumab 12/16/2016  Cycle 6 FOLFIRI/panitumumab 12/31/2016  CTs 01/16/2017-decreased size of pulmonary nodules and liver lesions. Decreased perirectal lymph node  Cycle 7 FOLFIRI/PANITUMUMAB 01/28/2017  Cycle 8FOLFIRI/PANITUMUMAB 02/10/2017  Cycle 9 FOLFIRI/PANITUMUMAB 02/24/2017  Cycle 10 FOLFIRI/panitumumab 03/10/2017  (  irinotecan held secondary to neutropenia)  Cycle 11 FOLFIRI/panitumumab 03/24/2017  Cycle12 FOLFIRI/Panitumumab 04/07/2017  Restaging CTs 04/25/2017-stable rectal mass with adjacent 8 mm left perirectal nodal metastasis. Prior hepatic metastases not visualized on the current study. Indeterminate 16 mm enhancing lesion present in segment 6, indeterminate. Minimal residual nodularity in the right upper lobe. No new/progressive pulmonary metastases.  Maintenance Xeloda/panitumumab 04/28/2017  Restaging CTs 08/18/2017-new and enlarging bilateral lung nodules; 2 hyperattenuating lesions in the liver appear similar to prior study; progression of wall thickening in the rectum with enlarging perirectal lymph nodes.  Initiation of radiation/Xeloda 09/01/2017, completed 09/22/2017  Cycle 1 FOLFIRI/Panitumumab 10/13/2017  Cycle 2 FOLFIRI 11/03/2017  Cycle 3 FOLFIRI 11/17/2017  Cycle 4 FOLFIRI 12/01/2017  Cycle 5 FOLFIRI 12/15/2017  CTs7/26/2019-mild progression lungs and liver; significantly improved rectal wall thickening; resolution of perirectal nodal adenopathy.  Cycle 6 FOLFIRI 12/29/2017  Cycle 7 FOLFIRI 01/12/2018  Cycle 8 FOLFIRI 01/26/2018  Cycle 9 FOLFIRI 02/09/2018  Cycle 10 FOLFIRI 02/23/2018  Restaging CTs 03/06/2018-majority of the pulmonary nodules are similar in size, at least 2 of the nodules are minimally increased in size.  Similar appearing lesions within the liver.  Similar-appearing mild wall thickening of the rectum.  Cycle 11 FOLFIRI 03/09/2018 2.Multiple colon polyps on the colonoscopy 05/22/2015, tubular villous adenoma and a tubular adenoma were removed 3.Anemia-likely secondary to rectal bleeding.  4.Family history of multiple cancers including breast and prostate cancer 5.Transient right arm numbness 06/20/2015 6. Port-A-Cath placement 07/06/2015 by Dr. Marcello Moores 7. Mild neutropenia secondary to chemotherapy following cycle 1 FOLFOX. Neulasta added with cycle 2. 8.  Delayed nausea following chemotherapy-emend added with cycle 3 FOLFOX;Emend added with cycle 12 FOLFIRI. 9. Early oxaliplatin neuropathy 10. 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 Xeloda resumed at a reduced dose. 11. Bilateral hand weakness. Question related to previous oxaliplatin. Referred to neurology 06/17/2016. 12. Hypertension-amlodipine started 07/08/2016 13. Rash secondary to PANITUMUMAB.  14. Neutropenia following cycle 1 FOLFIRI/PANITUMUMAB; neutropenia following cycle 4 FOLFIRI/PANITUMUMAB.  Disposition: Hunter Walker appears stable.  He has completed 10 cycles of FOLFIRI.  Recent restaging CTs show overall stable disease.  Dr. Benay Spice recommends continuing FOLFIRI.  He will complete cycle 11 today.  He will return for lab, follow-up and cycle 12 FOLFIRI in 2 weeks.  He will contact the office in the interim with any problems.  Patient seen with Dr. Benay Spice.  CT images reviewed on the computer with Hunter Walker.    Ned Card ANP/GNP-BC   03/09/2018  11:30 AM  This was a shared visit with Ned Card.  Hunter Walker appears unchanged.  The restaging CTs reveal stable disease.  There is no clinical evidence of disease progression.  He will continue FOLFIRI.  Julieanne Manson, MD

## 2018-03-09 NOTE — Telephone Encounter (Signed)
Printed avs and calender of upcoming appointment. Per 10/7 los 

## 2018-03-11 ENCOUNTER — Inpatient Hospital Stay: Payer: Medicare HMO

## 2018-03-11 VITALS — BP 121/79 | HR 66 | Temp 98.4°F | Resp 18

## 2018-03-11 DIAGNOSIS — G620 Drug-induced polyneuropathy: Secondary | ICD-10-CM | POA: Diagnosis not present

## 2018-03-11 DIAGNOSIS — C7800 Secondary malignant neoplasm of unspecified lung: Secondary | ICD-10-CM | POA: Diagnosis not present

## 2018-03-11 DIAGNOSIS — I10 Essential (primary) hypertension: Secondary | ICD-10-CM | POA: Diagnosis not present

## 2018-03-11 DIAGNOSIS — D701 Agranulocytosis secondary to cancer chemotherapy: Secondary | ICD-10-CM | POA: Diagnosis not present

## 2018-03-11 DIAGNOSIS — D649 Anemia, unspecified: Secondary | ICD-10-CM | POA: Diagnosis not present

## 2018-03-11 DIAGNOSIS — C20 Malignant neoplasm of rectum: Secondary | ICD-10-CM

## 2018-03-11 DIAGNOSIS — Z5111 Encounter for antineoplastic chemotherapy: Secondary | ICD-10-CM | POA: Diagnosis not present

## 2018-03-11 DIAGNOSIS — Z5189 Encounter for other specified aftercare: Secondary | ICD-10-CM | POA: Diagnosis not present

## 2018-03-11 DIAGNOSIS — C787 Secondary malignant neoplasm of liver and intrahepatic bile duct: Secondary | ICD-10-CM | POA: Diagnosis not present

## 2018-03-11 MED ORDER — PEGFILGRASTIM-CBQV 6 MG/0.6ML ~~LOC~~ SOSY
6.0000 mg | PREFILLED_SYRINGE | Freq: Once | SUBCUTANEOUS | Status: AC
  Administered 2018-03-11: 6 mg via SUBCUTANEOUS

## 2018-03-11 MED ORDER — HEPARIN SOD (PORK) LOCK FLUSH 100 UNIT/ML IV SOLN
500.0000 [IU] | Freq: Once | INTRAVENOUS | Status: AC | PRN
  Administered 2018-03-11: 500 [IU]
  Filled 2018-03-11: qty 5

## 2018-03-11 MED ORDER — PEGFILGRASTIM-CBQV 6 MG/0.6ML ~~LOC~~ SOSY
PREFILLED_SYRINGE | SUBCUTANEOUS | Status: AC
  Filled 2018-03-11: qty 0.6

## 2018-03-11 MED ORDER — SODIUM CHLORIDE 0.9% FLUSH
10.0000 mL | INTRAVENOUS | Status: DC | PRN
  Administered 2018-03-11: 10 mL
  Filled 2018-03-11: qty 10

## 2018-03-11 NOTE — Patient Instructions (Signed)
Pegfilgrastim injection Ellen Henri) What is this medicine? PEGFILGRASTIM (PEG fil gra stim) is a long-acting granulocyte colony-stimulating factor that stimulates the growth of neutrophils, a type of white blood cell important in the body's fight against infection. It is used to reduce the incidence of fever and infection in patients with certain types of cancer who are receiving chemotherapy that affects the bone marrow, and to increase survival after being exposed to high doses of radiation. This medicine may be used for other purposes; ask your health care provider or pharmacist if you have questions. COMMON BRAND NAME(S): Neulasta,Udenyca What should I tell my health care provider before I take this medicine? They need to know if you have any of these conditions: -kidney disease -latex allergy -ongoing radiation therapy -sickle cell disease -skin reactions to acrylic adhesives (On-Body Injector only) -an unusual or allergic reaction to pegfilgrastim, filgrastim, other medicines, foods, dyes, or preservatives -pregnant or trying to get pregnant -breast-feeding How should I use this medicine? This medicine is for injection under the skin. If you get this medicine at home, you will be taught how to prepare and give the pre-filled syringe or how to use the On-body Injector. Refer to the patient Instructions for Use for detailed instructions. Use exactly as directed. Tell your healthcare provider immediately if you suspect that the On-body Injector may not have performed as intended or if you suspect the use of the On-body Injector resulted in a missed or partial dose. It is important that you put your used needles and syringes in a special sharps container. Do not put them in a trash can. If you do not have a sharps container, call your pharmacist or healthcare provider to get one. Talk to your pediatrician regarding the use of this medicine in children. While this drug may be prescribed for selected  conditions, precautions do apply. Overdosage: If you think you have taken too much of this medicine contact a poison control center or emergency room at once. NOTE: This medicine is only for you. Do not share this medicine with others. What if I miss a dose? It is important not to miss your dose. Call your doctor or health care professional if you miss your dose. If you miss a dose due to an On-body Injector failure or leakage, a new dose should be administered as soon as possible using a single prefilled syringe for manual use. What may interact with this medicine? Interactions have not been studied. Give your health care provider a list of all the medicines, herbs, non-prescription drugs, or dietary supplements you use. Also tell them if you smoke, drink alcohol, or use illegal drugs. Some items may interact with your medicine. This list may not describe all possible interactions. Give your health care provider a list of all the medicines, herbs, non-prescription drugs, or dietary supplements you use. Also tell them if you smoke, drink alcohol, or use illegal drugs. Some items may interact with your medicine. What should I watch for while using this medicine? You may need blood work done while you are taking this medicine. If you are going to need a MRI, CT scan, or other procedure, tell your doctor that you are using this medicine (On-Body Injector only). What side effects may I notice from receiving this medicine? Side effects that you should report to your doctor or health care professional as soon as possible: -allergic reactions like skin rash, itching or hives, swelling of the face, lips, or tongue -dizziness -fever -pain, redness, or irritation at  site where injected -pinpoint red spots on the skin -red or dark-brown urine -shortness of breath or breathing problems -stomach or side pain, or pain at the shoulder -swelling -tiredness -trouble passing urine or change in the amount of  urine Side effects that usually do not require medical attention (report to your doctor or health care professional if they continue or are bothersome): -bone pain -muscle pain This list may not describe all possible side effects. Call your doctor for medical advice about side effects. You may report side effects to FDA at 1-800-FDA-1088. Where should I keep my medicine? Keep out of the reach of children. Store pre-filled syringes in a refrigerator between 2 and 8 degrees C (36 and 46 degrees F). Do not freeze. Keep in carton to protect from light. Throw away this medicine if it is left out of the refrigerator for more than 48 hours. Throw away any unused medicine after the expiration date. NOTE: This sheet is a summary. It may not cover all possible information. If you have questions about this medicine, talk to your doctor, pharmacist, or health care provider.  2018 Elsevier/Gold Standard (2016-05-16 12:58:03)  

## 2018-03-19 IMAGING — CT CT CHEST W/ CM
2 of 5 series · 12 of 36 positions shown, 15 images · IV contrast (APPLIED)
Comparison: 01/16/2017

CLINICAL DATA: Rectal cancer, diagnosed 1816, chemotherapy in
progress. Left chest pain x1 week. Constipation.

EXAM:
CT CHEST, ABDOMEN, AND PELVIS WITH CONTRAST
TECHNIQUE: Multidetector CT imaging of the chest, abdomen and pelvis was
performed following the standard protocol during bolus
administration of intravenous contrast.
CONTRAST:  100mL N5LYKR-5TT IOPAMIDOL (N5LYKR-5TT) INJECTION 61%

[Series 2: cap with · axial · 0.75mm/px · z∈[-524,+51]mm · 9 of 141 slices shown, 12 images]
[im 13/141  mediastinal]
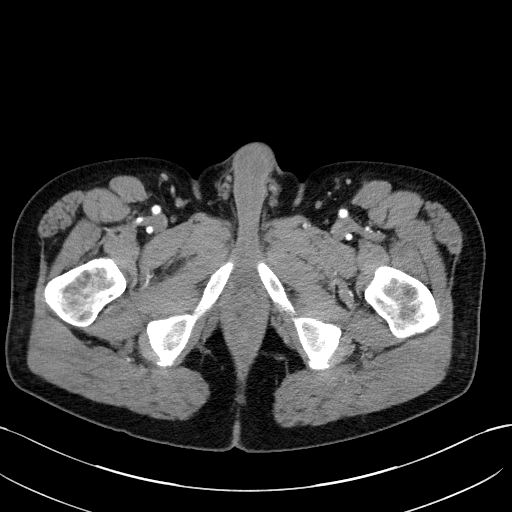
[im 13/141  lung]
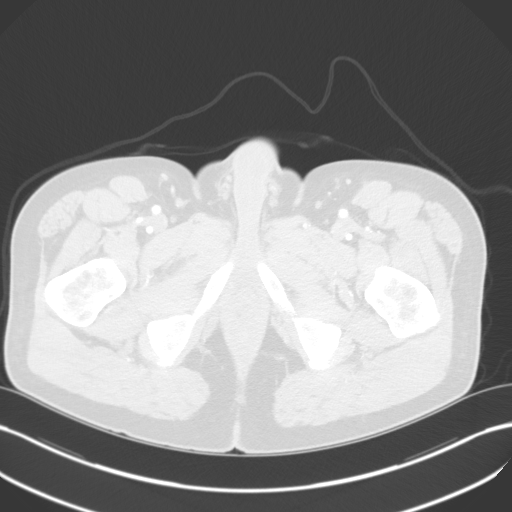
[im 26/141  lung]
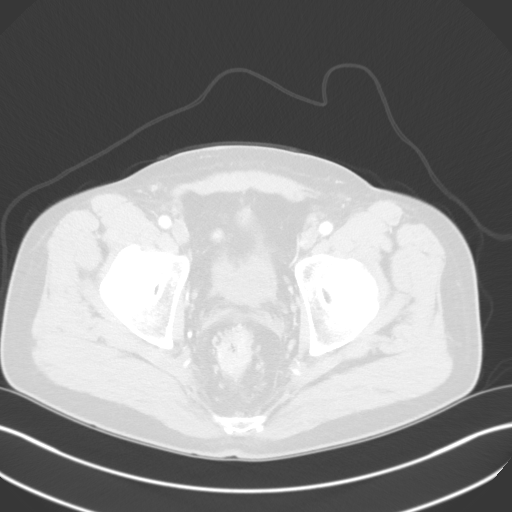
[im 39/141  lung]
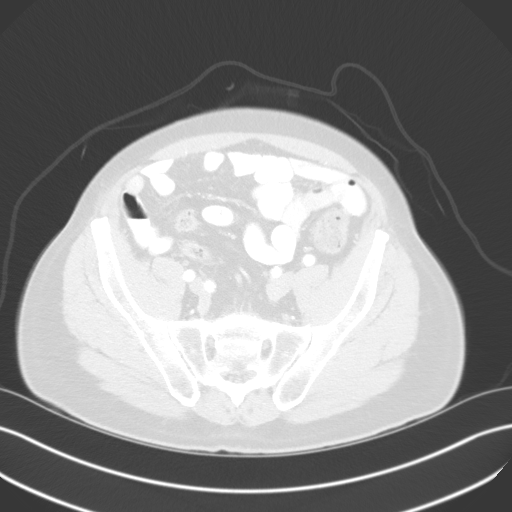
[im 51/141  lung]
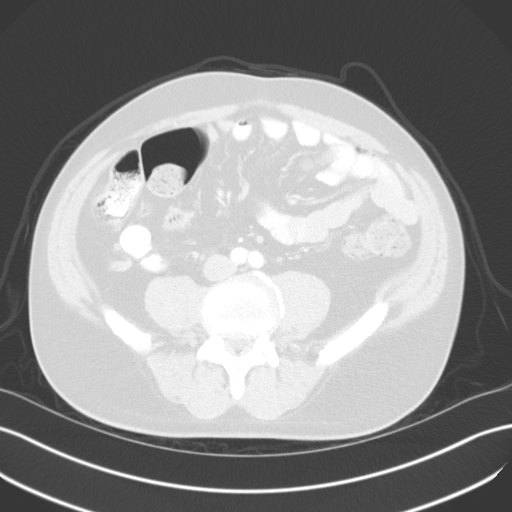
[im 77/141  mediastinal]
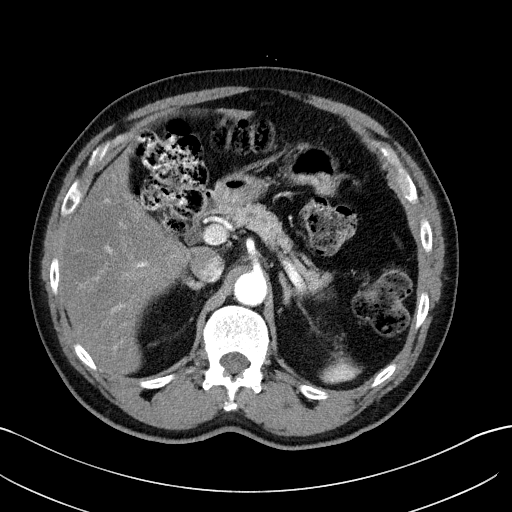
[im 77/141  lung]
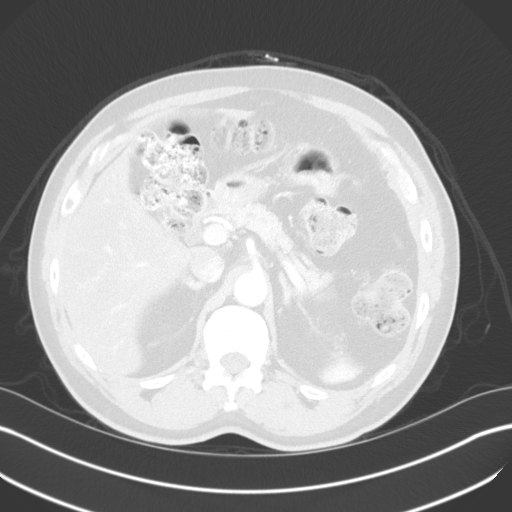
[im 90/141  lung]
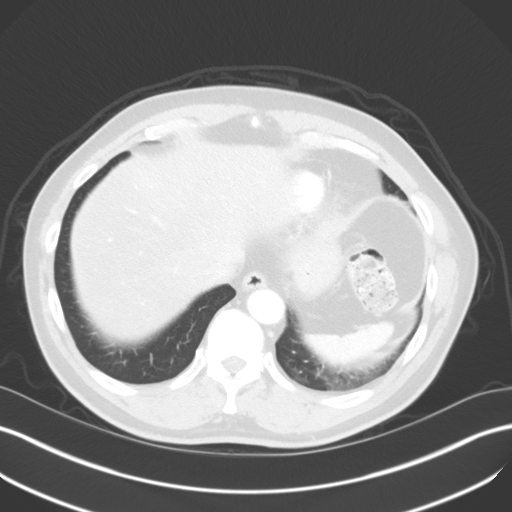
[im 102/141  lung]
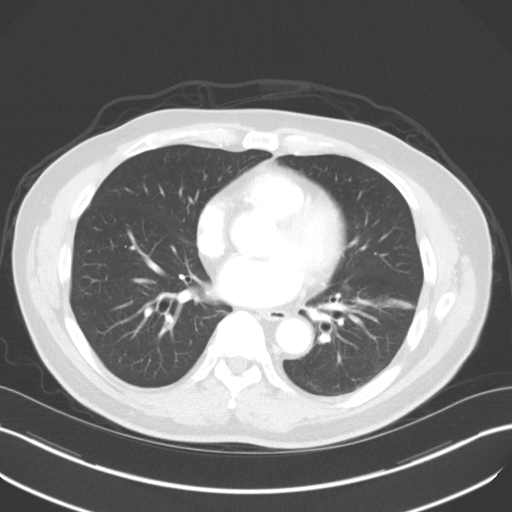
[im 115/141  lung]
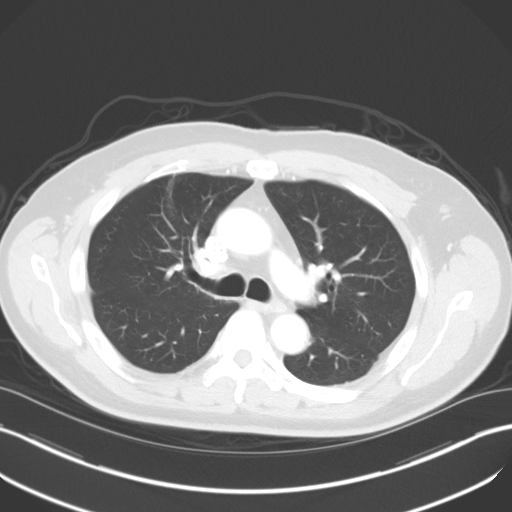
[im 128/141  mediastinal]
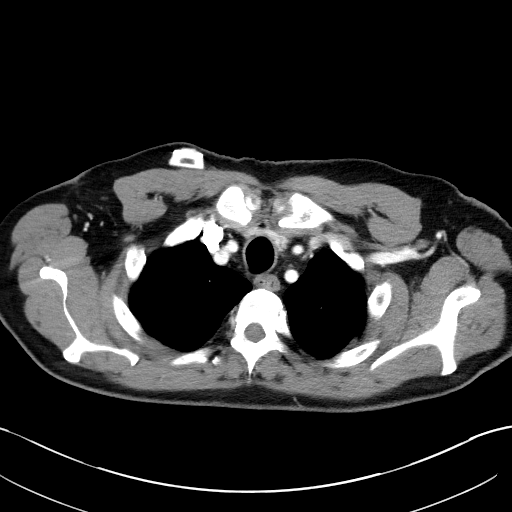
[im 128/141  lung]
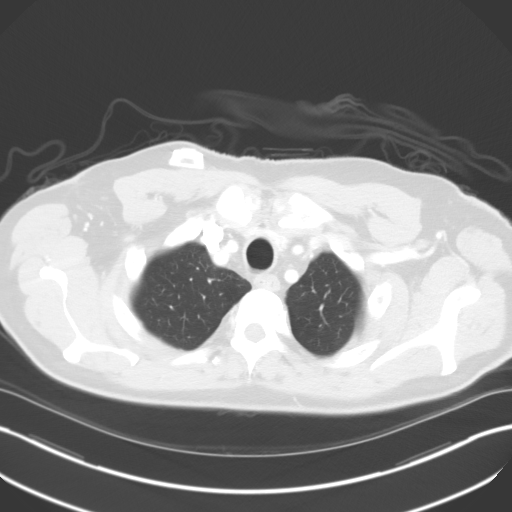

[Series 4: coronals · coronal · 0.74mm/px · 3 of 159 slices shown]
[im 32/159  lung]
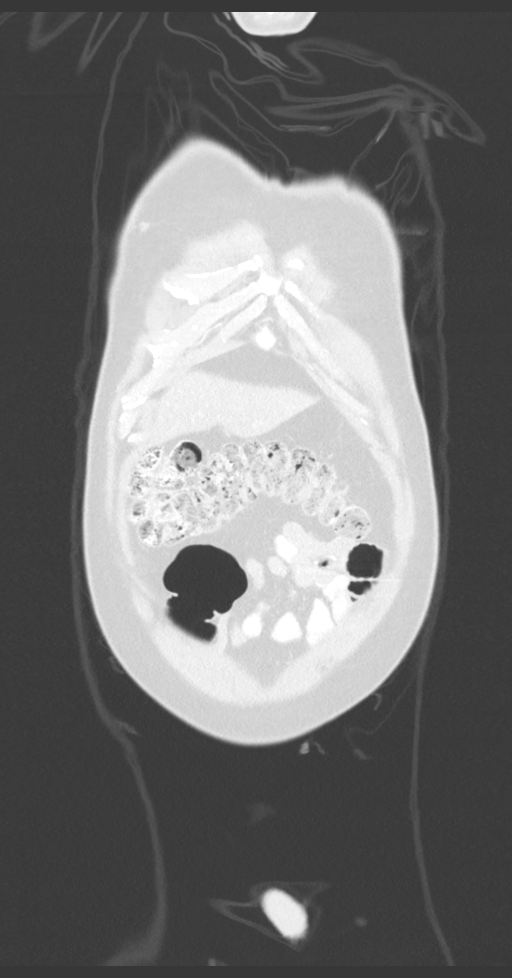
[im 64/159  lung]
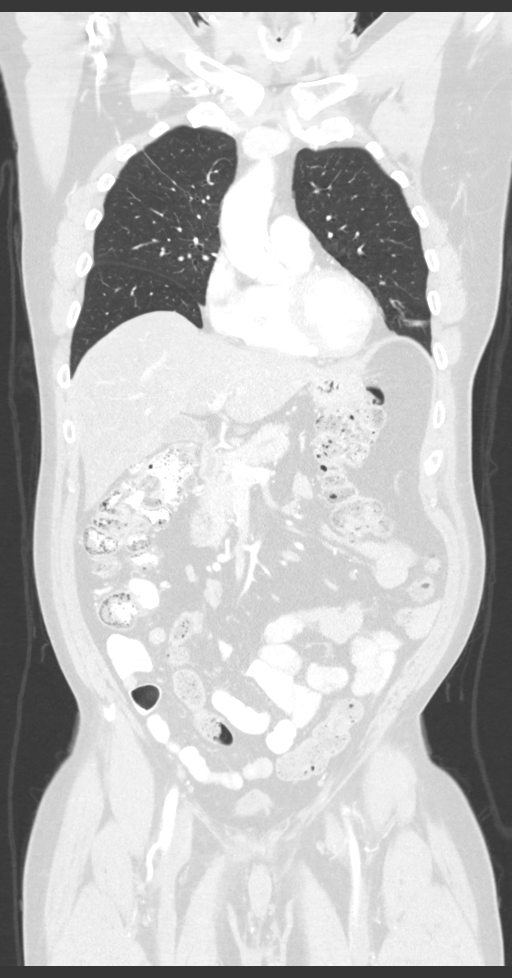
[im 95/159  lung]
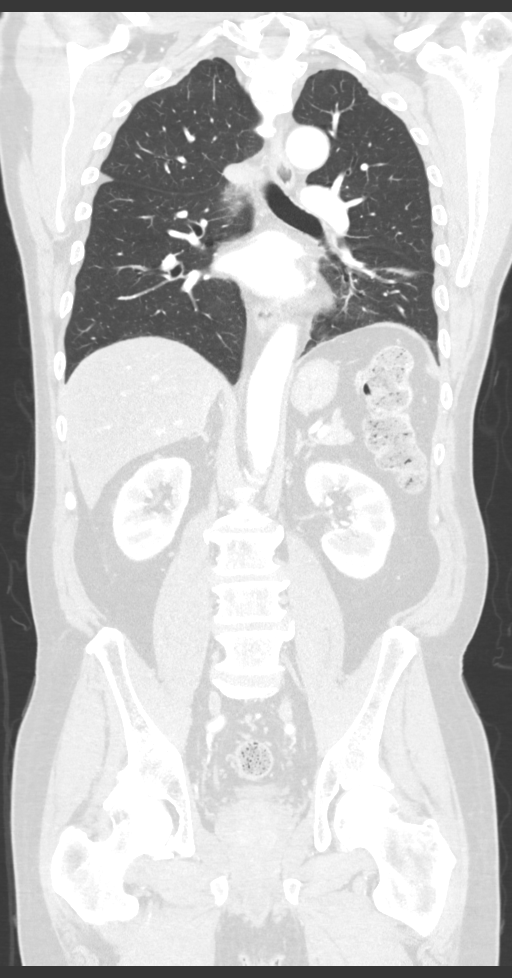

[12 of 36 positions shown; findings below may reference images not displayed]

FINDINGS: CT CHEST FINDINGS

Cardiovascular: The heart is normal in size. No pericardial
effusion.

No evidence of thoracic aortic aneurysm. Very mild atherosclerotic
calcifications of the aortic arch.

Right chest port terminates in the lower SVC.

Mediastinum/Nodes: No suspicious mediastinal, hilar, or axillary
lymphadenopathy.

Visualized thyroid is unremarkable.

Lungs/Pleura: Nodular scarring in the anterior right upper lobe,
measuring 10 x 4 mm (series 6/ image 42), at the site of prior
metastasis, grossly unchanged.

Additional 4 mm subpleural nodule in the lateral right lung apex
(series 6/ image 39), unchanged.

Linear scarring/ atelectasis in the lingula and left lower lobe.

No new/ suspicious pulmonary nodules.

No focal consolidation.

No pleural effusion or pneumothorax.

Musculoskeletal: Visualized osseous structures are within normal
limits.

CT ABDOMEN PELVIS FINDINGS

Hepatobiliary: Geographic hepatic steatosis, predominantly in the
right hepatic lobe. Underlying 16 mm enhancing lesion in segment 6
(series 2/ image 64), indeterminate. Metastatic disease is not
excluded. However, the prior lesions posteriorly in segment 6 and in
segment 2 are not evident on the current study.

Gallbladder is unremarkable. No intrahepatic or extrahepatic ductal
dilatation.

Pancreas: Mild prominence of the pancreatic duct without obstructing
mass.

Spleen: Within normal limits.

Adrenals/Urinary Tract: Adrenal glands within normal limits.

Kidneys are within normal limits.  No hydronephrosis.

Bladder is thick-walled although underdistended.

Stomach/Bowel: Stomach is within normal limits.

No evidence of bowel obstruction.

Normal appendix (series 2/ image 87).

Rectal wall thickening/ mass (series 2/image 115), similar.

Vascular/Lymphatic: No evidence of abdominal aortic aneurysm.

Atherosclerotic calcifications of the abdominal aorta and branch
vessels.

8 mm short axis left perirectal node (series 2/ image 113),
unchanged.

Reproductive: Prostate is grossly unremarkable.

Other: No abdominopelvic ascites.

Musculoskeletal: Degenerative changes of the lumbar spine.
IMPRESSION: Stable rectal mass with adjacent 8 mm left perirectal nodal
metastasis.

Prior hepatic metastases are not visualized on the current study.
However, an indeterminate 16 mm enhancing lesion is present in
segment 6 in a background of geographic hepatic steatosis,
metastasis not excluded. Consider MRI abdomen with/without contrast
for further evaluation, as clinically warranted.

Minimal residual nodularity in the right upper lobe, favoring
nodular scarring in this patient with prior history of pulmonary
metastases, attention on follow-up suggested. No new/progressive
pulmonary metastases.

## 2018-03-21 ENCOUNTER — Other Ambulatory Visit: Payer: Self-pay | Admitting: Oncology

## 2018-03-23 ENCOUNTER — Inpatient Hospital Stay (HOSPITAL_BASED_OUTPATIENT_CLINIC_OR_DEPARTMENT_OTHER): Payer: Medicare HMO | Admitting: Nurse Practitioner

## 2018-03-23 ENCOUNTER — Inpatient Hospital Stay: Payer: Medicare HMO

## 2018-03-23 ENCOUNTER — Telehealth: Payer: Self-pay

## 2018-03-23 ENCOUNTER — Encounter: Payer: Self-pay | Admitting: Nurse Practitioner

## 2018-03-23 VITALS — BP 115/83 | HR 64 | Temp 98.3°F | Resp 20 | Ht 73.0 in | Wt 186.6 lb

## 2018-03-23 DIAGNOSIS — D649 Anemia, unspecified: Secondary | ICD-10-CM

## 2018-03-23 DIAGNOSIS — Z5111 Encounter for antineoplastic chemotherapy: Secondary | ICD-10-CM | POA: Diagnosis not present

## 2018-03-23 DIAGNOSIS — D701 Agranulocytosis secondary to cancer chemotherapy: Secondary | ICD-10-CM

## 2018-03-23 DIAGNOSIS — Z923 Personal history of irradiation: Secondary | ICD-10-CM | POA: Diagnosis not present

## 2018-03-23 DIAGNOSIS — Z8042 Family history of malignant neoplasm of prostate: Secondary | ICD-10-CM | POA: Diagnosis not present

## 2018-03-23 DIAGNOSIS — G620 Drug-induced polyneuropathy: Secondary | ICD-10-CM

## 2018-03-23 DIAGNOSIS — C7800 Secondary malignant neoplasm of unspecified lung: Secondary | ICD-10-CM

## 2018-03-23 DIAGNOSIS — C20 Malignant neoplasm of rectum: Secondary | ICD-10-CM

## 2018-03-23 DIAGNOSIS — Z95828 Presence of other vascular implants and grafts: Secondary | ICD-10-CM

## 2018-03-23 DIAGNOSIS — Z5189 Encounter for other specified aftercare: Secondary | ICD-10-CM | POA: Diagnosis not present

## 2018-03-23 DIAGNOSIS — I10 Essential (primary) hypertension: Secondary | ICD-10-CM

## 2018-03-23 DIAGNOSIS — C787 Secondary malignant neoplasm of liver and intrahepatic bile duct: Secondary | ICD-10-CM

## 2018-03-23 LAB — CMP (CANCER CENTER ONLY)
ALT: 14 U/L (ref 0–44)
ANION GAP: 9 (ref 5–15)
AST: 16 U/L (ref 15–41)
Albumin: 3.4 g/dL — ABNORMAL LOW (ref 3.5–5.0)
Alkaline Phosphatase: 139 U/L — ABNORMAL HIGH (ref 38–126)
BUN: 16 mg/dL (ref 8–23)
CHLORIDE: 108 mmol/L (ref 98–111)
CO2: 25 mmol/L (ref 22–32)
Calcium: 9 mg/dL (ref 8.9–10.3)
Creatinine: 1.19 mg/dL (ref 0.61–1.24)
Glucose, Bld: 102 mg/dL — ABNORMAL HIGH (ref 70–99)
POTASSIUM: 4 mmol/L (ref 3.5–5.1)
SODIUM: 142 mmol/L (ref 135–145)
Total Bilirubin: 0.3 mg/dL (ref 0.3–1.2)
Total Protein: 7.2 g/dL (ref 6.5–8.1)

## 2018-03-23 LAB — CBC WITH DIFFERENTIAL (CANCER CENTER ONLY)
ABS IMMATURE GRANULOCYTES: 0.03 10*3/uL (ref 0.00–0.07)
BASOS PCT: 1 %
Basophils Absolute: 0 10*3/uL (ref 0.0–0.1)
Eosinophils Absolute: 0.1 10*3/uL (ref 0.0–0.5)
Eosinophils Relative: 3 %
HCT: 40.9 % (ref 39.0–52.0)
HEMOGLOBIN: 13.1 g/dL (ref 13.0–17.0)
IMMATURE GRANULOCYTES: 1 %
LYMPHS PCT: 18 %
Lymphs Abs: 0.9 10*3/uL (ref 0.7–4.0)
MCH: 32.2 pg (ref 26.0–34.0)
MCHC: 32 g/dL (ref 30.0–36.0)
MCV: 100.5 fL — ABNORMAL HIGH (ref 80.0–100.0)
MONO ABS: 0.5 10*3/uL (ref 0.1–1.0)
Monocytes Relative: 10 %
NEUTROS ABS: 3.5 10*3/uL (ref 1.7–7.7)
NEUTROS PCT: 67 %
PLATELETS: 244 10*3/uL (ref 150–400)
RBC: 4.07 MIL/uL — AB (ref 4.22–5.81)
RDW: 16.2 % — ABNORMAL HIGH (ref 11.5–15.5)
WBC: 5.1 10*3/uL (ref 4.0–10.5)
nRBC: 0 % (ref 0.0–0.2)

## 2018-03-23 MED ORDER — LEUCOVORIN CALCIUM INJECTION 350 MG
300.0000 mg/m2 | Freq: Once | INTRAVENOUS | Status: AC
  Administered 2018-03-23: 622 mg via INTRAVENOUS
  Filled 2018-03-23: qty 31.1

## 2018-03-23 MED ORDER — DEXAMETHASONE SODIUM PHOSPHATE 10 MG/ML IJ SOLN
10.0000 mg | Freq: Once | INTRAMUSCULAR | Status: AC
  Administered 2018-03-23: 10 mg via INTRAVENOUS

## 2018-03-23 MED ORDER — SODIUM CHLORIDE 0.9 % IV SOLN
Freq: Once | INTRAVENOUS | Status: AC
  Administered 2018-03-23: 13:00:00 via INTRAVENOUS
  Filled 2018-03-23: qty 250

## 2018-03-23 MED ORDER — IRINOTECAN HCL CHEMO INJECTION 100 MG/5ML
125.0000 mg/m2 | Freq: Once | INTRAVENOUS | Status: AC
  Administered 2018-03-23: 260 mg via INTRAVENOUS
  Filled 2018-03-23: qty 13

## 2018-03-23 MED ORDER — PALONOSETRON HCL INJECTION 0.25 MG/5ML
INTRAVENOUS | Status: AC
  Filled 2018-03-23: qty 5

## 2018-03-23 MED ORDER — ATROPINE SULFATE 1 MG/ML IJ SOLN
0.5000 mg | Freq: Once | INTRAMUSCULAR | Status: AC | PRN
  Administered 2018-03-23: 0.5 mg via INTRAVENOUS

## 2018-03-23 MED ORDER — PALONOSETRON HCL INJECTION 0.25 MG/5ML
0.2500 mg | Freq: Once | INTRAVENOUS | Status: AC
  Administered 2018-03-23: 0.25 mg via INTRAVENOUS

## 2018-03-23 MED ORDER — SODIUM CHLORIDE 0.9 % IV SOLN
INTRAVENOUS | Status: DC
  Administered 2018-03-23: 13:00:00 via INTRAVENOUS
  Filled 2018-03-23: qty 250

## 2018-03-23 MED ORDER — FLUOROURACIL CHEMO INJECTION 2.5 GM/50ML
300.0000 mg/m2 | Freq: Once | INTRAVENOUS | Status: AC
  Administered 2018-03-23: 600 mg via INTRAVENOUS
  Filled 2018-03-23: qty 12

## 2018-03-23 MED ORDER — SODIUM CHLORIDE 0.9 % IJ SOLN
10.0000 mL | INTRAMUSCULAR | Status: DC | PRN
  Administered 2018-03-23: 10 mL via INTRAVENOUS
  Filled 2018-03-23: qty 10

## 2018-03-23 MED ORDER — SODIUM CHLORIDE 0.9 % IV SOLN
2415.0000 mg/m2 | INTRAVENOUS | Status: DC
  Administered 2018-03-23: 5000 mg via INTRAVENOUS
  Filled 2018-03-23: qty 100

## 2018-03-23 MED ORDER — DEXAMETHASONE SODIUM PHOSPHATE 10 MG/ML IJ SOLN
INTRAMUSCULAR | Status: AC
  Filled 2018-03-23: qty 1

## 2018-03-23 MED ORDER — ATROPINE SULFATE 1 MG/ML IJ SOLN
INTRAMUSCULAR | Status: AC
  Filled 2018-03-23: qty 1

## 2018-03-23 NOTE — Telephone Encounter (Signed)
Printed avs and calender of upcoming appointment. Per 10/21 los 

## 2018-03-23 NOTE — Patient Instructions (Signed)
[facility] Discharge Instructions for Patients Receiving Chemotherapy  Today you received the following chemotherapy agents: Leucovorin, Irinotecan, 5FU  To help prevent nausea and vomiting after your treatment, we encourage you to take your nausea medication as directed.   If you develop nausea and vomiting that is not controlled by your nausea medication, call the clinic.   BELOW ARE SYMPTOMS THAT SHOULD BE REPORTED IMMEDIATELY:  *FEVER GREATER THAN 100.5 F  *CHILLS WITH OR WITHOUT FEVER  NAUSEA AND VOMITING THAT IS NOT CONTROLLED WITH YOUR NAUSEA MEDICATION  *UNUSUAL SHORTNESS OF BREATH  *UNUSUAL BRUISING OR BLEEDING  TENDERNESS IN MOUTH AND THROAT WITH OR WITHOUT PRESENCE OF ULCERS  *URINARY PROBLEMS  *BOWEL PROBLEMS  UNUSUAL RASH Items with * indicate a potential emergency and should be followed up as soon as possible.  Feel free to call the clinic should you have any questions or concerns. The clinic phone number is [phone].  Please show the CHEMO ALERT CARD at check-in to the Emergency Department and triage nurse.   

## 2018-03-23 NOTE — Progress Notes (Signed)
[place] OFFICE PROGRESS NOTE   Diagnosis: Rectal cancer  INTERVAL HISTORY:   Hunter Walker returns as scheduled.  He completed cycle 11 FOLFIRI 03/09/2018.  He denies nausea/vomiting.  No mouth sores.  No diarrhea.  He denies any bleeding.  Overall good appetite.  He periodically notes an aching discomfort in the legs.  He wonders if this is related to Neulasta.  Objective:  Vital signs in last 24 hours:  Blood pressure 115/83, pulse 64, temperature 98.3 F (36.8 C), temperature source Oral, resp. rate 20, height _0  (1.854 m), weight 186 lb 9.6 oz (84.6 kg), SpO2 100 %.    HEENT: No thrush or ulcers. Resp: Lungs clear bilaterally. Cardio: Regular rate and rhythm. GI: Abdomen soft and nontender.  No hepatomegaly. Vascular: No leg edema. Port-A-Cath without erythema.  Lab Results:  Lab Results  Component Value Date   WBC 5.1 03/23/2018   HGB 13.1 03/23/2018   HCT 40.9 03/23/2018   MCV 100.5 (H) 03/23/2018   PLT 244 03/23/2018   NEUTROABS 3.5 03/23/2018    Imaging:  No results found.  Medications: I have reviewed the patient's current medications.  Assessment/Plan: 1. Rectal cancer, invasive adenocarcinoma confirmed at colonoscopy 05/22/2015, EUS staging 06/01/2015 revealed a uT3,N1 lesion at 7 cm from the anal verge  Staging CT abdomen/pelvis 05/15/2015 confirmed a mass at the rectosigmoid colon, a suspicious perirectal lymph node, and a 6 mm right liver lesion felt to most likely be a cyst  CT chest 05/30/2015 with indeterminate pulmonary nodules and multiple liver lesions suspicious for metastases  Ultrasound 06/13/2014 with no liver lesions identified  MRI abdomen 06/21/2015 consistent with multiple liver metastases  CT biopsy of liver lesion 06/26/2015. Pathology showed metastatic adenocarcinoma consistent with colonic primary.MSI-stable, low mutation burden, no RAS or BRAFmutation  Cycle 1 FOLFOX 07/10/2015  Cycle 2 FOLFOX plus  Avastin 07/24/2015 with Neulasta support  Cycle 3 FOLFOX plus Avastin 08/07/2015  Cycle 4 FOLFOX plus Avastin 08/21/2015  cycle 5 FOLFOX plus Avastin 09/04/2015  Restaging CTs 09/15/2015 revealed a decrease in the size of liver lesions, decreased rectal primary, stable/decreased size of tiny lung nodules  Cycle 6 FOLFOX plus Avastin 09/18/2015  Cycle 7 FOLFOX plus Avastin 10/02/2015  Cycle 8 FOLFOX plus Avastin 10/16/2015  Cycle 9 FOLFOX plus Avastin 11/06/2015  Cycle 10 FOLFOX plus Avastin 11/20/2015  CTs 12/07/2015-mild decrease in wall thickening at the rectum, decrease in tiny hepatic metastases, stable lung nodules, no evidence of progressive metastatic disease, new left lower lobe infiltrate  Treatment changed to every three-week Avastin with every other week Xeloda beginning 12/11/2015  Restaging CTs 04/12/2016-interval increase in size of adjacent right upper lobe pulmonary nodules and right lower lobe pulmonary nodule; unchanged 5 mm lesion right hepatic lobe; no additional visualized hepatic metastasis. Grossly unchanged wall thickening of the rectum.  Continuation of Xeloda every other week and every three-week AvastinXeloda placed on hold 05/06/2016 due to hand-foot syndrome  Xeloda resumed 05/28/2016 1500 mg every morning and 1000 mg every afternoon 7 days on/7 days off  CT 09/27/2016-enlargement of lung nodules and new liver lesions, stable rectal mass  Cycle 1 FOLFIRI/panitumumab 10/08/2016  Cycle 2 panitumumab 10/23/2016 (FOLFIRI held secondary to neutropenia)  Cycle 3 FOLFIRI/PANITUMUMAB 11/04/2016  Cycle 4 FOLFIRI/panitumumab 11/18/2016  Cycle 5 FOLFIRI/panitumumab 12/16/2016  Cycle 6 FOLFIRI/panitumumab 12/31/2016  CTs 01/16/2017-decreased size of pulmonary nodules and liver lesions. Decreased perirectal lymph node  Cycle 7 FOLFIRI/PANITUMUMAB 01/28/2017  Cycle 8FOLFIRI/PANITUMUMAB 02/10/2017  Cycle 9 FOLFIRI/PANITUMUMAB 02/24/2017  Cycle  10  FOLFIRI/panitumumab 03/10/2017 (irinotecan held secondary to neutropenia)  Cycle 11 FOLFIRI/panitumumab 03/24/2017  Cycle12 FOLFIRI/Panitumumab 04/07/2017  Restaging CTs 04/25/2017-stable rectal mass with adjacent 8 mm left perirectal nodal metastasis. Prior hepatic metastases not visualized on the current study. Indeterminate 16 mm enhancing lesion present in segment 6, indeterminate. Minimal residual nodularity in the right upper lobe. No new/progressive pulmonary metastases.  Maintenance Xeloda/panitumumab 04/28/2017  Restaging CTs 08/18/2017-new and enlarging bilateral lung nodules; 2 hyperattenuating lesions in the liver appear similar to prior study; progression of wall thickening in the rectum with enlarging perirectal lymph nodes.  Initiation of radiation/Xeloda 09/01/2017, completed 09/22/2017  Cycle 1 FOLFIRI/Panitumumab 10/13/2017  Cycle 2 FOLFIRI 11/03/2017  Cycle 3 FOLFIRI 11/17/2017  Cycle 4 FOLFIRI 12/01/2017  Cycle 5 FOLFIRI 12/15/2017  CTs7/26/2019-mild progression lungs and liver; significantly improved rectal wall thickening; resolution of perirectal nodal adenopathy.  Cycle 6 FOLFIRI 12/29/2017  Cycle 7 FOLFIRI 01/12/2018  Cycle 8 FOLFIRI 01/26/2018  Cycle 9 FOLFIRI 02/09/2018  Cycle 10 FOLFIRI 02/23/2018  Restaging CTs 03/06/2018-majority of the pulmonary nodules are similar in size, at least 2 of the nodules are minimally increased in size.  Similar appearing lesions within the liver.  Similar-appearing mild wall thickening of the rectum.  Cycle 11 FOLFIRI 03/09/2018  Cycle 12 FOLFIRI 03/23/2018 2.Multiple colon polyps on the colonoscopy 05/22/2015, tubular villous adenoma and a tubular adenoma were removed 3.Anemia-likely secondary to rectal bleeding.  4.Family history of multiple cancers including breast and prostate cancer 5.Transient right arm numbness 06/20/2015 6. Port-A-Cath placement 07/06/2015 by Dr. Marcello Walker 7. Mild neutropenia secondary to  chemotherapy following cycle 1 FOLFOX. Neulasta added with cycle 2. 8. Delayed nausea following chemotherapy-emend added with cycle 3 FOLFOX;Emend added with cycle 12 FOLFIRI. 9. Early oxaliplatin neuropathy 10. 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 Xeloda resumed at a reduced dose. 11. Bilateral hand weakness. Question related to previous oxaliplatin. Referred to neurology 06/17/2016. 12. Hypertension-amlodipine started 07/08/2016 13. Rash secondary to PANITUMUMAB.  14. Neutropenia following cycle 1 FOLFIRI/PANITUMUMAB; neutropenia following cycle 4 FOLFIRI/PANITUMUMAB.   Disposition: Mr. Nevins appears stable.  He has completed 11 cycles of FOLFIRI.  There is no clinical evidence of disease progression.  Plan to proceed with cycle 12 today as scheduled.  We reviewed the CBC from today.  Counts are adequate for treatment.  He will return for lab, follow-up and the next cycle of FOLFIRI in 2 weeks.  He will contact the office in the interim with any problems.    Ned Card ANP/GNP-BC   03/23/2018  12:13 PM

## 2018-03-24 ENCOUNTER — Telehealth: Payer: Self-pay | Admitting: Oncology

## 2018-03-24 NOTE — Telephone Encounter (Signed)
Pt appts r/s per 10/21 sch message. Pt aware of d&t.

## 2018-03-24 NOTE — Telephone Encounter (Signed)
Pt sched per 10/21 sch message. Will pick up sched during next treatment date. Pt not scheduled for pump stop 1/1 due to sched not being open yet.

## 2018-03-25 ENCOUNTER — Inpatient Hospital Stay: Payer: Medicare HMO

## 2018-03-25 VITALS — BP 112/74 | HR 71 | Temp 98.2°F | Resp 18

## 2018-03-25 DIAGNOSIS — C7800 Secondary malignant neoplasm of unspecified lung: Secondary | ICD-10-CM | POA: Diagnosis not present

## 2018-03-25 DIAGNOSIS — C20 Malignant neoplasm of rectum: Secondary | ICD-10-CM | POA: Diagnosis not present

## 2018-03-25 DIAGNOSIS — D701 Agranulocytosis secondary to cancer chemotherapy: Secondary | ICD-10-CM | POA: Diagnosis not present

## 2018-03-25 DIAGNOSIS — D649 Anemia, unspecified: Secondary | ICD-10-CM | POA: Diagnosis not present

## 2018-03-25 DIAGNOSIS — C787 Secondary malignant neoplasm of liver and intrahepatic bile duct: Secondary | ICD-10-CM | POA: Diagnosis not present

## 2018-03-25 DIAGNOSIS — Z5189 Encounter for other specified aftercare: Secondary | ICD-10-CM | POA: Diagnosis not present

## 2018-03-25 DIAGNOSIS — I10 Essential (primary) hypertension: Secondary | ICD-10-CM | POA: Diagnosis not present

## 2018-03-25 DIAGNOSIS — G620 Drug-induced polyneuropathy: Secondary | ICD-10-CM | POA: Diagnosis not present

## 2018-03-25 DIAGNOSIS — Z5111 Encounter for antineoplastic chemotherapy: Secondary | ICD-10-CM | POA: Diagnosis not present

## 2018-03-25 MED ORDER — PEGFILGRASTIM-CBQV 6 MG/0.6ML ~~LOC~~ SOSY
6.0000 mg | PREFILLED_SYRINGE | Freq: Once | SUBCUTANEOUS | Status: AC
  Administered 2018-03-25: 6 mg via SUBCUTANEOUS

## 2018-03-25 MED ORDER — PEGFILGRASTIM-CBQV 6 MG/0.6ML ~~LOC~~ SOSY
PREFILLED_SYRINGE | SUBCUTANEOUS | Status: AC
  Filled 2018-03-25: qty 0.6

## 2018-03-25 MED ORDER — SODIUM CHLORIDE 0.9% FLUSH
10.0000 mL | INTRAVENOUS | Status: DC | PRN
  Administered 2018-03-25: 10 mL
  Filled 2018-03-25: qty 10

## 2018-03-25 MED ORDER — HEPARIN SOD (PORK) LOCK FLUSH 100 UNIT/ML IV SOLN
500.0000 [IU] | Freq: Once | INTRAVENOUS | Status: AC | PRN
  Administered 2018-03-25: 500 [IU]
  Filled 2018-03-25: qty 5

## 2018-03-25 NOTE — Patient Instructions (Signed)
Pegfilgrastim injection What is this medicine? PEGFILGRASTIM (PEG fil gra stim) is a long-acting granulocyte colony-stimulating factor that stimulates the growth of neutrophils, a type of white blood cell important in the body's fight against infection. It is used to reduce the incidence of fever and infection in patients with certain types of cancer who are receiving chemotherapy that affects the bone marrow, and to increase survival after being exposed to high doses of radiation. This medicine may be used for other purposes; ask your health care provider or pharmacist if you have questions. COMMON BRAND NAME(S): Neulasta What should I tell my health care provider before I take this medicine? They need to know if you have any of these conditions: -kidney disease -latex allergy -ongoing radiation therapy -sickle cell disease -skin reactions to acrylic adhesives (On-Body Injector only) -an unusual or allergic reaction to pegfilgrastim, filgrastim, other medicines, foods, dyes, or preservatives -pregnant or trying to get pregnant -breast-feeding How should I use this medicine? This medicine is for injection under the skin. If you get this medicine at home, you will be taught how to prepare and give the pre-filled syringe or how to use the On-body Injector. Refer to the patient Instructions for Use for detailed instructions. Use exactly as directed. Tell your healthcare provider immediately if you suspect that the On-body Injector may not have performed as intended or if you suspect the use of the On-body Injector resulted in a missed or partial dose. It is important that you put your used needles and syringes in a special sharps container. Do not put them in a trash can. If you do not have a sharps container, call your pharmacist or healthcare provider to get one. Talk to your pediatrician regarding the use of this medicine in children. While this drug may be prescribed for selected conditions,  precautions do apply. Overdosage: If you think you have taken too much of this medicine contact a poison control center or emergency room at once. NOTE: This medicine is only for you. Do not share this medicine with others. What if I miss a dose? It is important not to miss your dose. Call your doctor or health care professional if you miss your dose. If you miss a dose due to an On-body Injector failure or leakage, a new dose should be administered as soon as possible using a single prefilled syringe for manual use. What may interact with this medicine? Interactions have not been studied. Give your health care provider a list of all the medicines, herbs, non-prescription drugs, or dietary supplements you use. Also tell them if you smoke, drink alcohol, or use illegal drugs. Some items may interact with your medicine. This list may not describe all possible interactions. Give your health care provider a list of all the medicines, herbs, non-prescription drugs, or dietary supplements you use. Also tell them if you smoke, drink alcohol, or use illegal drugs. Some items may interact with your medicine. What should I watch for while using this medicine? You may need blood work done while you are taking this medicine. If you are going to need a MRI, CT scan, or other procedure, tell your doctor that you are using this medicine (On-Body Injector only). What side effects may I notice from receiving this medicine? Side effects that you should report to your doctor or health care professional as soon as possible: -allergic reactions like skin rash, itching or hives, swelling of the face, lips, or tongue -dizziness -fever -pain, redness, or irritation at site   where injected -pinpoint red spots on the skin -red or dark-brown urine -shortness of breath or breathing problems -stomach or side pain, or pain at the shoulder -swelling -tiredness -trouble passing urine or change in the amount of urine Side  effects that usually do not require medical attention (report to your doctor or health care professional if they continue or are bothersome): -bone pain -muscle pain This list may not describe all possible side effects. Call your doctor for medical advice about side effects. You may report side effects to FDA at 1-800-FDA-1088. Where should I keep my medicine? Keep out of the reach of children. Store pre-filled syringes in a refrigerator between 2 and 8 degrees C (36 and 46 degrees F). Do not freeze. Keep in carton to protect from light. Throw away this medicine if it is left out of the refrigerator for more than 48 hours. Throw away any unused medicine after the expiration date. NOTE: This sheet is a summary. It may not cover all possible information. If you have questions about this medicine, talk to your doctor, pharmacist, or health care provider.  2018 Elsevier/Gold Standard (2016-05-16 12:58:03)  

## 2018-04-05 ENCOUNTER — Other Ambulatory Visit: Payer: Self-pay | Admitting: Oncology

## 2018-04-06 ENCOUNTER — Ambulatory Visit: Payer: Medicare HMO | Admitting: Nurse Practitioner

## 2018-04-06 ENCOUNTER — Inpatient Hospital Stay: Payer: Medicare HMO

## 2018-04-06 ENCOUNTER — Inpatient Hospital Stay (HOSPITAL_BASED_OUTPATIENT_CLINIC_OR_DEPARTMENT_OTHER): Payer: Medicare HMO | Admitting: Oncology

## 2018-04-06 ENCOUNTER — Inpatient Hospital Stay: Payer: Medicare HMO | Attending: Nurse Practitioner

## 2018-04-06 VITALS — BP 132/84 | HR 75 | Temp 98.5°F | Resp 18 | Ht 73.0 in | Wt 187.0 lb

## 2018-04-06 DIAGNOSIS — Z8042 Family history of malignant neoplasm of prostate: Secondary | ICD-10-CM | POA: Insufficient documentation

## 2018-04-06 DIAGNOSIS — G620 Drug-induced polyneuropathy: Secondary | ICD-10-CM | POA: Insufficient documentation

## 2018-04-06 DIAGNOSIS — Z803 Family history of malignant neoplasm of breast: Secondary | ICD-10-CM | POA: Diagnosis not present

## 2018-04-06 DIAGNOSIS — R110 Nausea: Secondary | ICD-10-CM | POA: Insufficient documentation

## 2018-04-06 DIAGNOSIS — C20 Malignant neoplasm of rectum: Secondary | ICD-10-CM | POA: Diagnosis not present

## 2018-04-06 DIAGNOSIS — Z5189 Encounter for other specified aftercare: Secondary | ICD-10-CM | POA: Diagnosis not present

## 2018-04-06 DIAGNOSIS — D649 Anemia, unspecified: Secondary | ICD-10-CM | POA: Insufficient documentation

## 2018-04-06 DIAGNOSIS — D701 Agranulocytosis secondary to cancer chemotherapy: Secondary | ICD-10-CM | POA: Diagnosis not present

## 2018-04-06 DIAGNOSIS — R21 Rash and other nonspecific skin eruption: Secondary | ICD-10-CM | POA: Diagnosis not present

## 2018-04-06 DIAGNOSIS — I10 Essential (primary) hypertension: Secondary | ICD-10-CM | POA: Diagnosis not present

## 2018-04-06 DIAGNOSIS — Z5111 Encounter for antineoplastic chemotherapy: Secondary | ICD-10-CM | POA: Insufficient documentation

## 2018-04-06 DIAGNOSIS — C787 Secondary malignant neoplasm of liver and intrahepatic bile duct: Secondary | ICD-10-CM

## 2018-04-06 DIAGNOSIS — C7800 Secondary malignant neoplasm of unspecified lung: Secondary | ICD-10-CM | POA: Insufficient documentation

## 2018-04-06 DIAGNOSIS — R531 Weakness: Secondary | ICD-10-CM | POA: Diagnosis not present

## 2018-04-06 LAB — CBC WITH DIFFERENTIAL (CANCER CENTER ONLY)
Abs Immature Granulocytes: 0.05 10*3/uL (ref 0.00–0.07)
BASOS ABS: 0 10*3/uL (ref 0.0–0.1)
BASOS PCT: 1 %
EOS ABS: 0.1 10*3/uL (ref 0.0–0.5)
Eosinophils Relative: 2 %
HEMATOCRIT: 43 % (ref 39.0–52.0)
Hemoglobin: 13.8 g/dL (ref 13.0–17.0)
Immature Granulocytes: 1 %
LYMPHS ABS: 1.1 10*3/uL (ref 0.7–4.0)
Lymphocytes Relative: 19 %
MCH: 32.7 pg (ref 26.0–34.0)
MCHC: 32.1 g/dL (ref 30.0–36.0)
MCV: 101.9 fL — ABNORMAL HIGH (ref 80.0–100.0)
Monocytes Absolute: 0.6 10*3/uL (ref 0.1–1.0)
Monocytes Relative: 11 %
NRBC: 0 % (ref 0.0–0.2)
Neutro Abs: 4 10*3/uL (ref 1.7–7.7)
Neutrophils Relative %: 66 %
Platelet Count: 220 10*3/uL (ref 150–400)
RBC: 4.22 MIL/uL (ref 4.22–5.81)
RDW: 16.6 % — AB (ref 11.5–15.5)
WBC Count: 6 10*3/uL (ref 4.0–10.5)

## 2018-04-06 LAB — CMP (CANCER CENTER ONLY)
ALK PHOS: 131 U/L — AB (ref 38–126)
ALT: 14 U/L (ref 0–44)
AST: 16 U/L (ref 15–41)
Albumin: 3.6 g/dL (ref 3.5–5.0)
Anion gap: 8 (ref 5–15)
BILIRUBIN TOTAL: 0.3 mg/dL (ref 0.3–1.2)
BUN: 16 mg/dL (ref 8–23)
CALCIUM: 9 mg/dL (ref 8.9–10.3)
CO2: 24 mmol/L (ref 22–32)
CREATININE: 1.26 mg/dL — AB (ref 0.61–1.24)
Chloride: 108 mmol/L (ref 98–111)
GFR, EST NON AFRICAN AMERICAN: 58 mL/min — AB (ref >60)
Glucose, Bld: 95 mg/dL (ref 70–99)
Potassium: 4.2 mmol/L (ref 3.5–5.1)
Sodium: 140 mmol/L (ref 135–145)
TOTAL PROTEIN: 7.5 g/dL (ref 6.5–8.1)

## 2018-04-06 LAB — CEA (IN HOUSE-CHCC): CEA (CHCC-In House): 5.26 ng/mL — ABNORMAL HIGH (ref 0.00–5.00)

## 2018-04-06 MED ORDER — SODIUM CHLORIDE 0.9 % IV SOLN
INTRAVENOUS | Status: DC
  Administered 2018-04-06: 12:00:00 via INTRAVENOUS
  Filled 2018-04-06: qty 250

## 2018-04-06 MED ORDER — FLUOROURACIL CHEMO INJECTION 2.5 GM/50ML
300.0000 mg/m2 | Freq: Once | INTRAVENOUS | Status: AC
  Administered 2018-04-06: 600 mg via INTRAVENOUS
  Filled 2018-04-06: qty 12

## 2018-04-06 MED ORDER — BISACODYL 5 MG PO TBEC: 5.0000 mg | DELAYED_RELEASE_TABLET | Freq: Every day | ORAL | 5 refills | Status: DC | PRN

## 2018-04-06 MED ORDER — ATROPINE SULFATE 1 MG/ML IJ SOLN
0.5000 mg | Freq: Once | INTRAMUSCULAR | Status: AC | PRN
  Administered 2018-04-06: 0.5 mg via INTRAVENOUS

## 2018-04-06 MED ORDER — PALONOSETRON HCL INJECTION 0.25 MG/5ML
INTRAVENOUS | Status: AC
  Filled 2018-04-06: qty 5

## 2018-04-06 MED ORDER — LEUCOVORIN CALCIUM INJECTION 350 MG
300.0000 mg/m2 | Freq: Once | INTRAVENOUS | Status: AC
  Administered 2018-04-06: 622 mg via INTRAVENOUS
  Filled 2018-04-06: qty 31.1

## 2018-04-06 MED ORDER — ATROPINE SULFATE 1 MG/ML IJ SOLN
INTRAMUSCULAR | Status: AC
  Filled 2018-04-06: qty 1

## 2018-04-06 MED ORDER — SODIUM CHLORIDE 0.9% FLUSH
10.0000 mL | INTRAVENOUS | Status: DC | PRN
  Filled 2018-04-06: qty 10

## 2018-04-06 MED ORDER — DEXAMETHASONE SODIUM PHOSPHATE 10 MG/ML IJ SOLN
10.0000 mg | Freq: Once | INTRAMUSCULAR | Status: AC
  Administered 2018-04-06: 10 mg via INTRAVENOUS

## 2018-04-06 MED ORDER — HEPARIN SOD (PORK) LOCK FLUSH 100 UNIT/ML IV SOLN
500.0000 [IU] | Freq: Once | INTRAVENOUS | Status: DC | PRN
  Filled 2018-04-06: qty 5

## 2018-04-06 MED ORDER — DEXAMETHASONE SODIUM PHOSPHATE 10 MG/ML IJ SOLN
INTRAMUSCULAR | Status: AC
  Filled 2018-04-06: qty 1

## 2018-04-06 MED ORDER — SODIUM CHLORIDE 0.9 % IV SOLN
5000.0000 mg | INTRAVENOUS | Status: DC
  Administered 2018-04-06: 5000 mg via INTRAVENOUS
  Filled 2018-04-06: qty 100

## 2018-04-06 MED ORDER — IRINOTECAN HCL CHEMO INJECTION 100 MG/5ML
125.0000 mg/m2 | Freq: Once | INTRAVENOUS | Status: AC
  Administered 2018-04-06: 260 mg via INTRAVENOUS
  Filled 2018-04-06: qty 13

## 2018-04-06 MED ORDER — SODIUM CHLORIDE 0.9 % IV SOLN
Freq: Once | INTRAVENOUS | Status: AC
  Administered 2018-04-06: 12:00:00 via INTRAVENOUS
  Filled 2018-04-06: qty 250

## 2018-04-06 MED ORDER — PALONOSETRON HCL INJECTION 0.25 MG/5ML
0.2500 mg | Freq: Once | INTRAVENOUS | Status: AC
  Administered 2018-04-06: 0.25 mg via INTRAVENOUS

## 2018-04-06 MED ORDER — PANTOPRAZOLE SODIUM 40 MG PO TBEC: 40.0000 mg | DELAYED_RELEASE_TABLET | Freq: Every day | ORAL | 3 refills | Status: DC

## 2018-04-06 NOTE — Addendum Note (Signed)
Addended by: Tania Ade on: 04/06/2018 12:47 PM   Modules accepted: Orders

## 2018-04-06 NOTE — Patient Instructions (Signed)
Implanted Port Home Guide An implanted port is a type of central line that is placed under the skin. Central lines are used to provide IV access when treatment or nutrition needs to be given through a person's veins. Implanted ports are used for long-term IV access. An implanted port may be placed because:  You need IV medicine that would be irritating to the small veins in your hands or arms.  You need long-term IV medicines, such as antibiotics.  You need IV nutrition for a long period.  You need frequent blood draws for lab tests.  You need dialysis.  Implanted ports are usually placed in the chest area, but they can also be placed in the upper arm, the abdomen, or the leg. An implanted port has two main parts:  Reservoir. The reservoir is round and will appear as a small, raised area under your skin. The reservoir is the part where a needle is inserted to give medicines or draw blood.  Catheter. The catheter is a thin, flexible tube that extends from the reservoir. The catheter is placed into a large vein. Medicine that is inserted into the reservoir goes into the catheter and then into the vein.  How will I care for my incision site? Do not get the incision site wet. Bathe or shower as directed by your health care provider. How is my port accessed? Special steps must be taken to access the port:  Before the port is accessed, a numbing cream can be placed on the skin. This helps numb the skin over the port site.  Your health care provider uses a sterile technique to access the port. ? Your health care provider must put on a mask and sterile gloves. ? The skin over your port is cleaned carefully with an antiseptic and allowed to dry. ? The port is gently pinched between sterile gloves, and a needle is inserted into the port.  Only "non-coring" port needles should be used to access the port. Once the port is accessed, a blood return should be checked. This helps ensure that the port  is in the vein and is not clogged.  If your port needs to remain accessed for a constant infusion, a clear (transparent) bandage will be placed over the needle site. The bandage and needle will need to be changed every week, or as directed by your health care provider.  Keep the bandage covering the needle clean and dry. Do not get it wet. Follow your health care provider's instructions on how to take a shower or bath while the port is accessed.  If your port does not need to stay accessed, no bandage is needed over the port.  What is flushing? Flushing helps keep the port from getting clogged. Follow your health care provider's instructions on how and when to flush the port. Ports are usually flushed with saline solution or a medicine called heparin. The need for flushing will depend on how the port is used.  If the port is used for intermittent medicines or blood draws, the port will need to be flushed: ? After medicines have been given. ? After blood has been drawn. ? As part of routine maintenance.  If a constant infusion is running, the port may not need to be flushed.  How long will my port stay implanted? The port can stay in for as long as your health care provider thinks it is needed. When it is time for the port to come out, surgery will be   done to remove it. The procedure is similar to the one performed when the port was put in. When should I seek immediate medical care? When you have an implanted port, you should seek immediate medical care if:  You notice a bad smell coming from the incision site.  You have swelling, redness, or drainage at the incision site.  You have more swelling or pain at the port site or the surrounding area.  You have a fever that is not controlled with medicine.  This information is not intended to replace advice given to you by your health care provider. Make sure you discuss any questions you have with your health care provider. Document  Released: 05/20/2005 Document Revised: 10/26/2015 Document Reviewed: 01/25/2013 Elsevier Interactive Patient Education  2017 [employer]  

## 2018-04-06 NOTE — Patient Instructions (Signed)
[facility] Discharge Instructions for Patients Receiving Chemotherapy  Today you received the following chemotherapy agents: irinotecan (Camptosar), leucovorin, and fluorouracil (Adrucil/5FU)  To help prevent nausea and vomiting after your treatment, we encourage you to take your nausea medication as directed.   If you develop nausea and vomiting that is not controlled by your nausea medication, call the clinic.   BELOW ARE SYMPTOMS THAT SHOULD BE REPORTED IMMEDIATELY:  *FEVER GREATER THAN 100.5 F  *CHILLS WITH OR WITHOUT FEVER  NAUSEA AND VOMITING THAT IS NOT CONTROLLED WITH YOUR NAUSEA MEDICATION  *UNUSUAL SHORTNESS OF BREATH  *UNUSUAL BRUISING OR BLEEDING  TENDERNESS IN MOUTH AND THROAT WITH OR WITHOUT PRESENCE OF ULCERS  *URINARY PROBLEMS  *BOWEL PROBLEMS  UNUSUAL RASH Items with * indicate a potential emergency and should be followed up as soon as possible.  Feel free to call the clinic should you have any questions or concerns. The clinic phone number is [phone].  Please show the [name] at check-in to the Emergency Department and triage nurse.

## 2018-04-06 NOTE — Progress Notes (Signed)
[place] OFFICE PROGRESS NOTE   Diagnosis: Rectal cancer  INTERVAL HISTORY:   Mr. Fraizer returns as scheduled.  He feels well.  He completed other cycle of FOLFIRI on 03/23/2018.  No mouth sores, nausea, or diarrhea.  No rectal pain.  Objective:  Vital signs in last 24 hours:  Blood pressure 132/84, pulse 75, temperature 98.5 F (36.9 C), temperature source Oral, resp. rate 18, height _0  (1.854 m), weight 187 lb (84.8 kg), SpO2 100 %.    HEENT: No ulcers or buccal thrush.  Mild white coat over the tongue. Resp: Lungs clear bilaterally Cardio: Regular rate and rhythm GI: No hepatomegaly, nontender Vascular: No leg edema  Skin: Mild hyperpigmentation of the hands, no skin breakdown  Portacath/PICC-without erythema  Lab Results:  Lab Results  Component Value Date   WBC 6.0 04/06/2018   HGB 13.8 04/06/2018   HCT 43.0 04/06/2018   MCV 101.9 (H) 04/06/2018   PLT 220 04/06/2018   NEUTROABS 4.0 04/06/2018    CMP  Lab Results  Component Value Date   NA 142 03/23/2018   K 4.0 03/23/2018   CL 108 03/23/2018   CO2 25 03/23/2018   GLUCOSE 102 (H) 03/23/2018   BUN 16 03/23/2018   CREATININE 1.19 03/23/2018   CALCIUM 9.0 03/23/2018   PROT 7.2 03/23/2018   ALBUMIN 3.4 (L) 03/23/2018   AST 16 03/23/2018   ALT 14 03/23/2018   ALKPHOS 139 (H) 03/23/2018   BILITOT 0.3 03/23/2018   GFRNONAA >60 03/23/2018   GFRAA >60 03/23/2018    Lab Results  Component Value Date   CEA1 4.31 02/23/2018    Medications: I have reviewed the patient's current medications.   Assessment/Plan: 1. Rectal cancer, invasive adenocarcinoma confirmed at colonoscopy 05/22/2015, EUS staging 06/01/2015 revealed a uT3,N1 lesion at 7 cm from the anal verge  Staging CT abdomen/pelvis 05/15/2015 confirmed a mass at the rectosigmoid colon, a suspicious perirectal lymph node, and a 6 mm right liver lesion felt to most likely be a cyst  CT chest 05/30/2015 with indeterminate  pulmonary nodules and multiple liver lesions suspicious for metastases  Ultrasound 06/13/2014 with no liver lesions identified  MRI abdomen 06/21/2015 consistent with multiple liver metastases  CT biopsy of liver lesion 06/26/2015. Pathology showed metastatic adenocarcinoma consistent with colonic primary.MSI-stable, low mutation burden, no RAS or BRAFmutation  Cycle 1 FOLFOX 07/10/2015  Cycle 2 FOLFOX plus Avastin 07/24/2015 with Neulasta support  Cycle 3 FOLFOX plus Avastin 08/07/2015  Cycle 4 FOLFOX plus Avastin 08/21/2015  cycle 5 FOLFOX plus Avastin 09/04/2015  Restaging CTs 09/15/2015 revealed a decrease in the size of liver lesions, decreased rectal primary, stable/decreased size of tiny lung nodules  Cycle 6 FOLFOX plus Avastin 09/18/2015  Cycle 7 FOLFOX plus Avastin 10/02/2015  Cycle 8 FOLFOX plus Avastin 10/16/2015  Cycle 9 FOLFOX plus Avastin 11/06/2015  Cycle 10 FOLFOX plus Avastin 11/20/2015  CTs 12/07/2015-mild decrease in wall thickening at the rectum, decrease in tiny hepatic metastases, stable lung nodules, no evidence of progressive metastatic disease, new left lower lobe infiltrate  Treatment changed to every three-week Avastin with every other week Xeloda beginning 12/11/2015  Restaging CTs 04/12/2016-interval increase in size of adjacent right upper lobe pulmonary nodules and right lower lobe pulmonary nodule; unchanged 5 mm lesion right hepatic lobe; no additional visualized hepatic metastasis. Grossly unchanged wall thickening of the rectum.  Continuation of Xeloda every other week and every three-week AvastinXeloda placed on hold 05/06/2016 due to hand-foot syndrome  Xeloda resumed  05/28/2016 1500 mg every morning and 1000 mg every afternoon 7 days on/7 days off  CT 09/27/2016-enlargement of lung nodules and new liver lesions, stable rectal mass  Cycle 1 FOLFIRI/panitumumab 10/08/2016  Cycle 2 panitumumab 10/23/2016 (FOLFIRI held secondary to  neutropenia)  Cycle 3 FOLFIRI/PANITUMUMAB 11/04/2016  Cycle 4 FOLFIRI/panitumumab 11/18/2016  Cycle 5 FOLFIRI/panitumumab 12/16/2016  Cycle 6 FOLFIRI/panitumumab 12/31/2016  CTs 01/16/2017-decreased size of pulmonary nodules and liver lesions. Decreased perirectal lymph node  Cycle 7 FOLFIRI/PANITUMUMAB 01/28/2017  Cycle 8FOLFIRI/PANITUMUMAB 02/10/2017  Cycle 9 FOLFIRI/PANITUMUMAB 02/24/2017  Cycle 10 FOLFIRI/panitumumab 03/10/2017 (irinotecan held secondary to neutropenia)  Cycle 11 FOLFIRI/panitumumab 03/24/2017  Cycle12 FOLFIRI/Panitumumab 04/07/2017  Restaging CTs 04/25/2017-stable rectal mass with adjacent 8 mm left perirectal nodal metastasis. Prior hepatic metastases not visualized on the current study. Indeterminate 16 mm enhancing lesion present in segment 6, indeterminate. Minimal residual nodularity in the right upper lobe. No new/progressive pulmonary metastases.  Maintenance Xeloda/panitumumab 04/28/2017  Restaging CTs 08/18/2017-new and enlarging bilateral lung nodules; 2 hyperattenuating lesions in the liver appear similar to prior study; progression of wall thickening in the rectum with enlarging perirectal lymph nodes.  Initiation of radiation/Xeloda 09/01/2017, completed 09/22/2017  Cycle 1 FOLFIRI/Panitumumab 10/13/2017  Cycle 2 FOLFIRI 11/03/2017  Cycle 3 FOLFIRI 11/17/2017  Cycle 4 FOLFIRI 12/01/2017  Cycle 5 FOLFIRI 12/15/2017  CTs7/26/2019-mild progression lungs and liver; significantly improved rectal wall thickening; resolution of perirectal nodal adenopathy.  Cycle 6 FOLFIRI 12/29/2017  Cycle 7 FOLFIRI 01/12/2018  Cycle 8 FOLFIRI 01/26/2018  Cycle 9 FOLFIRI 02/09/2018  Cycle 10 FOLFIRI 02/23/2018  Restaging CTs 03/06/2018-majority of the pulmonary nodules are similar in size, at least 2 of the nodules are minimally increased in size. Similar appearing lesions within the liver. Similar-appearing mild wall thickening of the rectum.  Cycle  11FOLFIRI 03/09/2018  Cycle 12 FOLFIRI 03/23/2018  Cycle 13 FOLFIRI 04/06/2018 2.Multiple colon polyps on the colonoscopy 05/22/2015, tubular villous adenoma and a tubular adenoma were removed 3.Anemia-likely secondary to rectal bleeding.  4.Family history of multiple cancers including breast and prostate cancer 5.Transient right arm numbness 06/20/2015 6. Port-A-Cath placement 07/06/2015 by Dr. Marcello Moores 7. Mild neutropenia secondary to chemotherapy following cycle 1 FOLFOX. Neulasta added with cycle 2. 8. Delayed nausea following chemotherapy-emend added with cycle 3 FOLFOX;Emend added with cycle 12 FOLFIRI. 9. Early oxaliplatin neuropathy 10. 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 Xeloda resumed at a reduced dose. 11. Bilateral hand weakness. Question related to previous oxaliplatin. Referred to neurology 06/17/2016. 12. Hypertension-amlodipine started 07/08/2016 13. Rash secondary to PANITUMUMAB.  14. Neutropenia following cycle 1 FOLFIRI/PANITUMUMAB; neutropenia following cycle 4 FOLFIRI/PANITUMUMAB.    Disposition: Hunter Walker appears stable.  He will complete another cycle of FOLFIRI today.  He continues to tolerate chemotherapy well.  He will return for an office visit in the next cycle of FOLFIRI in 2 weeks.  We will plan for a restaging CT evaluation after the cycle of chemotherapy on 05/04/2018.  15 minutes were spent with the patient today.  The majority of the time was used for counseling and coordination of care.  Betsy Coder, MD  04/06/2018  11:41 AM

## 2018-04-08 ENCOUNTER — Inpatient Hospital Stay: Payer: Medicare HMO

## 2018-04-08 VITALS — BP 129/81 | HR 70 | Temp 98.0°F | Resp 18

## 2018-04-08 DIAGNOSIS — C787 Secondary malignant neoplasm of liver and intrahepatic bile duct: Secondary | ICD-10-CM | POA: Diagnosis not present

## 2018-04-08 DIAGNOSIS — D701 Agranulocytosis secondary to cancer chemotherapy: Secondary | ICD-10-CM | POA: Diagnosis not present

## 2018-04-08 DIAGNOSIS — Z5189 Encounter for other specified aftercare: Secondary | ICD-10-CM | POA: Diagnosis not present

## 2018-04-08 DIAGNOSIS — R110 Nausea: Secondary | ICD-10-CM | POA: Diagnosis not present

## 2018-04-08 DIAGNOSIS — I10 Essential (primary) hypertension: Secondary | ICD-10-CM | POA: Diagnosis not present

## 2018-04-08 DIAGNOSIS — R531 Weakness: Secondary | ICD-10-CM | POA: Diagnosis not present

## 2018-04-08 DIAGNOSIS — C20 Malignant neoplasm of rectum: Secondary | ICD-10-CM

## 2018-04-08 DIAGNOSIS — C7800 Secondary malignant neoplasm of unspecified lung: Secondary | ICD-10-CM | POA: Diagnosis not present

## 2018-04-08 DIAGNOSIS — Z5111 Encounter for antineoplastic chemotherapy: Secondary | ICD-10-CM | POA: Diagnosis not present

## 2018-04-08 MED ORDER — SODIUM CHLORIDE 0.9% FLUSH
10.0000 mL | INTRAVENOUS | Status: DC | PRN
  Administered 2018-04-08: 10 mL
  Filled 2018-04-08: qty 10

## 2018-04-08 MED ORDER — PEGFILGRASTIM-CBQV 6 MG/0.6ML ~~LOC~~ SOSY
PREFILLED_SYRINGE | SUBCUTANEOUS | Status: AC
  Filled 2018-04-08: qty 0.6

## 2018-04-08 MED ORDER — HEPARIN SOD (PORK) LOCK FLUSH 100 UNIT/ML IV SOLN
500.0000 [IU] | Freq: Once | INTRAVENOUS | Status: AC | PRN
  Administered 2018-04-08: 500 [IU]
  Filled 2018-04-08: qty 5

## 2018-04-08 MED ORDER — PEGFILGRASTIM-CBQV 6 MG/0.6ML ~~LOC~~ SOSY
6.0000 mg | PREFILLED_SYRINGE | Freq: Once | SUBCUTANEOUS | Status: AC
  Administered 2018-04-08: 6 mg via SUBCUTANEOUS

## 2018-04-13 MED ORDER — INFLUENZA VAC SPLIT QUAD 0.5 ML IM SUSY
PREFILLED_SYRINGE | INTRAMUSCULAR | Status: AC
  Filled 2018-04-13: qty 0.5

## 2018-04-15 ENCOUNTER — Ambulatory Visit: Payer: Medicare HMO | Admitting: Oncology

## 2018-04-15 ENCOUNTER — Other Ambulatory Visit: Payer: Medicare HMO

## 2018-04-16 ENCOUNTER — Encounter: Payer: Self-pay | Admitting: Internal Medicine

## 2018-04-16 ENCOUNTER — Ambulatory Visit (INDEPENDENT_AMBULATORY_CARE_PROVIDER_SITE_OTHER): Payer: Medicare HMO | Admitting: Internal Medicine

## 2018-04-16 DIAGNOSIS — I10 Essential (primary) hypertension: Secondary | ICD-10-CM

## 2018-04-16 DIAGNOSIS — C20 Malignant neoplasm of rectum: Secondary | ICD-10-CM | POA: Diagnosis not present

## 2018-04-16 NOTE — Progress Notes (Signed)
   Subjective:    Patient ID: Hunter Walker, male    DOB: [birth year], 67 y.o.   MRN: [id number]  HPI The patient is a 67 YO man coming in for follow up of blood pressure. Doing well overall. Still doing chemo and his cancer is stable. He denies major side effects from chemo. Some stomach upset with the infusions. Denies lightheadedness or dizziness. Weight is stable. Denies fevers or chills.   Review of Systems  Constitutional: Negative.   HENT: Negative.   Eyes: Negative.   Respiratory: Negative for cough, chest tightness and shortness of breath.   Cardiovascular: Negative for chest pain, palpitations and leg swelling.  Gastrointestinal: Positive for nausea. Negative for abdominal distention, abdominal pain, constipation, diarrhea and vomiting.  Musculoskeletal: Negative.   Skin: Negative.   Neurological: Negative.   Psychiatric/Behavioral: Negative.       Objective:   Physical Exam  Constitutional: He is oriented to person, place, and time. He appears well-developed and well-nourished.  HENT:  Head: Normocephalic and atraumatic.  Eyes: EOM are normal.  Neck: Normal range of motion.  Cardiovascular: Normal rate and regular rhythm.  Pulmonary/Chest: Effort normal and breath sounds normal. No respiratory distress. He has no wheezes. He has no rales.  Abdominal: Soft. Bowel sounds are normal. He exhibits no distension. There is no tenderness. There is no rebound.  Musculoskeletal: He exhibits no edema.  Neurological: He is alert and oriented to person, place, and time. Coordination normal.  Skin: Skin is warm and dry.  Psychiatric: He has a normal mood and affect.   Vitals:   04/16/18 0931  BP: 118/70  Pulse: 81  Temp: 97.9 F (36.6 C)  TempSrc: Oral  SpO2: 97%  Weight: 186 lb (84.4 kg)  Height: 6\' 1"  (1.854 m)      Assessment & Plan:

## 2018-04-16 NOTE — Assessment & Plan Note (Signed)
BP at goal on amlodipine 5 mg daily. No low BP. Can adjust if needed during chemotherapy.

## 2018-04-16 NOTE — Assessment & Plan Note (Signed)
Still doing chemo with oncology and minor side effects which are well managed at this time. Due CT scan December.

## 2018-04-16 NOTE — Patient Instructions (Signed)
We do not need to make any changes today.    

## 2018-04-19 ENCOUNTER — Other Ambulatory Visit: Payer: Self-pay | Admitting: Oncology

## 2018-04-20 ENCOUNTER — Ambulatory Visit: Payer: Medicare HMO | Admitting: Nurse Practitioner

## 2018-04-20 ENCOUNTER — Inpatient Hospital Stay: Payer: Medicare HMO

## 2018-04-20 ENCOUNTER — Inpatient Hospital Stay (HOSPITAL_BASED_OUTPATIENT_CLINIC_OR_DEPARTMENT_OTHER): Payer: Medicare HMO | Admitting: Nurse Practitioner

## 2018-04-20 ENCOUNTER — Encounter: Payer: Self-pay | Admitting: Nurse Practitioner

## 2018-04-20 VITALS — BP 145/77 | HR 71 | Temp 98.3°F | Resp 17 | Ht 73.0 in | Wt 187.4 lb

## 2018-04-20 DIAGNOSIS — Z803 Family history of malignant neoplasm of breast: Secondary | ICD-10-CM

## 2018-04-20 DIAGNOSIS — C20 Malignant neoplasm of rectum: Secondary | ICD-10-CM

## 2018-04-20 DIAGNOSIS — Z8042 Family history of malignant neoplasm of prostate: Secondary | ICD-10-CM

## 2018-04-20 DIAGNOSIS — I10 Essential (primary) hypertension: Secondary | ICD-10-CM

## 2018-04-20 DIAGNOSIS — R531 Weakness: Secondary | ICD-10-CM

## 2018-04-20 DIAGNOSIS — R110 Nausea: Secondary | ICD-10-CM

## 2018-04-20 DIAGNOSIS — C7800 Secondary malignant neoplasm of unspecified lung: Secondary | ICD-10-CM

## 2018-04-20 DIAGNOSIS — R21 Rash and other nonspecific skin eruption: Secondary | ICD-10-CM | POA: Diagnosis not present

## 2018-04-20 DIAGNOSIS — G620 Drug-induced polyneuropathy: Secondary | ICD-10-CM

## 2018-04-20 DIAGNOSIS — D701 Agranulocytosis secondary to cancer chemotherapy: Secondary | ICD-10-CM | POA: Diagnosis not present

## 2018-04-20 DIAGNOSIS — C787 Secondary malignant neoplasm of liver and intrahepatic bile duct: Secondary | ICD-10-CM

## 2018-04-20 DIAGNOSIS — Z5111 Encounter for antineoplastic chemotherapy: Secondary | ICD-10-CM | POA: Diagnosis not present

## 2018-04-20 DIAGNOSIS — D649 Anemia, unspecified: Secondary | ICD-10-CM

## 2018-04-20 DIAGNOSIS — Z5189 Encounter for other specified aftercare: Secondary | ICD-10-CM | POA: Diagnosis not present

## 2018-04-20 DIAGNOSIS — Z95828 Presence of other vascular implants and grafts: Secondary | ICD-10-CM | POA: Insufficient documentation

## 2018-04-20 LAB — CBC WITH DIFFERENTIAL (CANCER CENTER ONLY)
ABS IMMATURE GRANULOCYTES: 0.05 10*3/uL (ref 0.00–0.07)
Basophils Absolute: 0 10*3/uL (ref 0.0–0.1)
Basophils Relative: 1 %
EOS PCT: 1 %
Eosinophils Absolute: 0.1 10*3/uL (ref 0.0–0.5)
HCT: 41.7 % (ref 39.0–52.0)
HEMOGLOBIN: 13.3 g/dL (ref 13.0–17.0)
Immature Granulocytes: 1 %
LYMPHS PCT: 16 %
Lymphs Abs: 1 10*3/uL (ref 0.7–4.0)
MCH: 32.1 pg (ref 26.0–34.0)
MCHC: 31.9 g/dL (ref 30.0–36.0)
MCV: 100.7 fL — AB (ref 80.0–100.0)
MONO ABS: 0.6 10*3/uL (ref 0.1–1.0)
MONOS PCT: 10 %
NEUTROS ABS: 4.6 10*3/uL (ref 1.7–7.7)
Neutrophils Relative %: 71 %
Platelet Count: 233 10*3/uL (ref 150–400)
RBC: 4.14 MIL/uL — ABNORMAL LOW (ref 4.22–5.81)
RDW: 16.2 % — ABNORMAL HIGH (ref 11.5–15.5)
WBC Count: 6.4 10*3/uL (ref 4.0–10.5)
nRBC: 0 % (ref 0.0–0.2)

## 2018-04-20 LAB — CMP (CANCER CENTER ONLY)
ALK PHOS: 138 U/L — AB (ref 38–126)
ALT: 12 U/L (ref 0–44)
AST: 16 U/L (ref 15–41)
Albumin: 3.4 g/dL — ABNORMAL LOW (ref 3.5–5.0)
Anion gap: 7 (ref 5–15)
BILIRUBIN TOTAL: 0.3 mg/dL (ref 0.3–1.2)
BUN: 14 mg/dL (ref 8–23)
CALCIUM: 9.1 mg/dL (ref 8.9–10.3)
CO2: 26 mmol/L (ref 22–32)
Chloride: 104 mmol/L (ref 98–111)
Creatinine: 1.32 mg/dL — ABNORMAL HIGH (ref 0.61–1.24)
GFR, EST NON AFRICAN AMERICAN: 55 mL/min — AB (ref >60)
Glucose, Bld: 96 mg/dL (ref 70–99)
Potassium: 4 mmol/L (ref 3.5–5.1)
Sodium: 137 mmol/L (ref 135–145)
TOTAL PROTEIN: 7.6 g/dL (ref 6.5–8.1)

## 2018-04-20 MED ORDER — FLUOROURACIL CHEMO INJECTION 2.5 GM/50ML
300.0000 mg/m2 | Freq: Once | INTRAVENOUS | Status: AC
  Administered 2018-04-20: 600 mg via INTRAVENOUS
  Filled 2018-04-20: qty 12

## 2018-04-20 MED ORDER — ATROPINE SULFATE 1 MG/ML IJ SOLN
0.5000 mg | Freq: Once | INTRAMUSCULAR | Status: AC | PRN
  Administered 2018-04-20: 0.5 mg via INTRAVENOUS

## 2018-04-20 MED ORDER — DEXAMETHASONE SODIUM PHOSPHATE 10 MG/ML IJ SOLN
10.0000 mg | Freq: Once | INTRAMUSCULAR | Status: AC
  Administered 2018-04-20: 10 mg via INTRAVENOUS

## 2018-04-20 MED ORDER — ATROPINE SULFATE 1 MG/ML IJ SOLN
INTRAMUSCULAR | Status: AC
  Filled 2018-04-20: qty 1

## 2018-04-20 MED ORDER — PALONOSETRON HCL INJECTION 0.25 MG/5ML
0.2500 mg | Freq: Once | INTRAVENOUS | Status: AC
  Administered 2018-04-20: 0.25 mg via INTRAVENOUS

## 2018-04-20 MED ORDER — PALONOSETRON HCL INJECTION 0.25 MG/5ML
INTRAVENOUS | Status: AC
  Filled 2018-04-20: qty 5

## 2018-04-20 MED ORDER — SODIUM CHLORIDE 0.9 % IV SOLN
Freq: Once | INTRAVENOUS | Status: AC
  Administered 2018-04-20: 12:00:00 via INTRAVENOUS
  Filled 2018-04-20: qty 250

## 2018-04-20 MED ORDER — LEUCOVORIN CALCIUM INJECTION 350 MG
300.0000 mg/m2 | Freq: Once | INTRAVENOUS | Status: AC
  Administered 2018-04-20: 622 mg via INTRAVENOUS
  Filled 2018-04-20: qty 31.1

## 2018-04-20 MED ORDER — SODIUM CHLORIDE 0.9% FLUSH
10.0000 mL | Freq: Once | INTRAVENOUS | Status: AC
  Administered 2018-04-20: 10 mL
  Filled 2018-04-20: qty 10

## 2018-04-20 MED ORDER — DEXAMETHASONE SODIUM PHOSPHATE 10 MG/ML IJ SOLN
INTRAMUSCULAR | Status: AC
  Filled 2018-04-20: qty 1

## 2018-04-20 MED ORDER — SODIUM CHLORIDE 0.9 % IV SOLN
2425.0000 mg/m2 | INTRAVENOUS | Status: DC
  Administered 2018-04-20: 5000 mg via INTRAVENOUS
  Filled 2018-04-20: qty 100

## 2018-04-20 MED ORDER — IRINOTECAN HCL CHEMO INJECTION 100 MG/5ML
125.0000 mg/m2 | Freq: Once | INTRAVENOUS | Status: AC
  Administered 2018-04-20: 260 mg via INTRAVENOUS
  Filled 2018-04-20: qty 13

## 2018-04-20 NOTE — Progress Notes (Signed)
[place] OFFICE PROGRESS NOTE   Diagnosis: Rectal cancer  INTERVAL HISTORY:   Hunter Walker returns as scheduled.  He completed another cycle of FOLFIRI 04/06/2018.  He had mild nausea for several days after the treatment.  No mouth sores.  No diarrhea.  He notes an alteration in taste.  Objective:  Vital signs in last 24 hours:  Blood pressure (!) 145/77, pulse 71, temperature 98.3 F (36.8 C), temperature source Oral, resp. rate 17, height '6\' 1"'  (1.854 m), weight 187 lb 6.4 oz (85 kg), SpO2 100 %.    HEENT: No thrush or ulcers. Resp: Lungs clear bilaterally. Cardio: Regular rate and rhythm. GI: Abdomen soft and nontender.  No hepatomegaly. Vascular: No leg edema.  Skin: Palms without erythema. Port-A-Cath without erythema.   Lab Results:  Lab Results  Component Value Date   WBC 6.4 04/20/2018   HGB 13.3 04/20/2018   HCT 41.7 04/20/2018   MCV 100.7 (H) 04/20/2018   PLT 233 04/20/2018   NEUTROABS 4.6 04/20/2018    Imaging:  No results found.  Medications: I have reviewed the patient's current medications.  Assessment/Plan: 1. Rectal cancer, invasive adenocarcinoma confirmed at colonoscopy 05/22/2015, EUS staging 06/01/2015 revealed a uT3,N1 lesion at 7 cm from the anal verge  Staging CT abdomen/pelvis 05/15/2015 confirmed a mass at the rectosigmoid colon, a suspicious perirectal lymph node, and a 6 mm right liver lesion felt to most likely be a cyst  CT chest 05/30/2015 with indeterminate pulmonary nodules and multiple liver lesions suspicious for metastases  Ultrasound 06/13/2014 with no liver lesions identified  MRI abdomen 06/21/2015 consistent with multiple liver metastases  CT biopsy of liver lesion 06/26/2015. Pathology showed metastatic adenocarcinoma consistent with colonic primary.MSI-stable, low mutation burden, no RAS or BRAFmutation  Cycle 1 FOLFOX 07/10/2015  Cycle 2 FOLFOX plus Avastin 07/24/2015 with Neulasta  support  Cycle 3 FOLFOX plus Avastin 08/07/2015  Cycle 4 FOLFOX plus Avastin 08/21/2015  cycle 5 FOLFOX plus Avastin 09/04/2015  Restaging CTs 09/15/2015 revealed a decrease in the size of liver lesions, decreased rectal primary, stable/decreased size of tiny lung nodules  Cycle 6 FOLFOX plus Avastin 09/18/2015  Cycle 7 FOLFOX plus Avastin 10/02/2015  Cycle 8 FOLFOX plus Avastin 10/16/2015  Cycle 9 FOLFOX plus Avastin 11/06/2015  Cycle 10 FOLFOX plus Avastin 11/20/2015  CTs 12/07/2015-mild decrease in wall thickening at the rectum, decrease in tiny hepatic metastases, stable lung nodules, no evidence of progressive metastatic disease, new left lower lobe infiltrate  Treatment changed to every three-week Avastin with every other week Xeloda beginning 12/11/2015  Restaging CTs 04/12/2016-interval increase in size of adjacent right upper lobe pulmonary nodules and right lower lobe pulmonary nodule; unchanged 5 mm lesion right hepatic lobe; no additional visualized hepatic metastasis. Grossly unchanged wall thickening of the rectum.  Continuation of Xeloda every other week and every three-week AvastinXeloda placed on hold 05/06/2016 due to hand-foot syndrome  Xeloda resumed 05/28/2016 1500 mg every morning and 1000 mg every afternoon 7 days on/7 days off  CT 09/27/2016-enlargement of lung nodules and new liver lesions, stable rectal mass  Cycle 1 FOLFIRI/panitumumab 10/08/2016  Cycle 2 panitumumab 10/23/2016 (FOLFIRI held secondary to neutropenia)  Cycle 3 FOLFIRI/PANITUMUMAB 11/04/2016  Cycle 4 FOLFIRI/panitumumab 11/18/2016  Cycle 5 FOLFIRI/panitumumab 12/16/2016  Cycle 6 FOLFIRI/panitumumab 12/31/2016  CTs 01/16/2017-decreased size of pulmonary nodules and liver lesions. Decreased perirectal lymph node  Cycle 7 FOLFIRI/PANITUMUMAB 01/28/2017  Cycle 8FOLFIRI/PANITUMUMAB 02/10/2017  Cycle 9 FOLFIRI/PANITUMUMAB 02/24/2017  Cycle 10 FOLFIRI/panitumumab 03/10/2017  (irinotecan held  secondary to neutropenia)  Cycle 11 FOLFIRI/panitumumab 03/24/2017  Cycle12 FOLFIRI/Panitumumab 04/07/2017  Restaging CTs 04/25/2017-stable rectal mass with adjacent 8 mm left perirectal nodal metastasis. Prior hepatic metastases not visualized on the current study. Indeterminate 16 mm enhancing lesion present in segment 6, indeterminate. Minimal residual nodularity in the right upper lobe. No new/progressive pulmonary metastases.  Maintenance Xeloda/panitumumab 04/28/2017  Restaging CTs 08/18/2017-new and enlarging bilateral lung nodules; 2 hyperattenuating lesions in the liver appear similar to prior study; progression of wall thickening in the rectum with enlarging perirectal lymph nodes.  Initiation of radiation/Xeloda 09/01/2017, completed 09/22/2017  Cycle 1 FOLFIRI/Panitumumab 10/13/2017  Cycle 2 FOLFIRI 11/03/2017  Cycle 3 FOLFIRI 11/17/2017  Cycle 4 FOLFIRI 12/01/2017  Cycle 5 FOLFIRI 12/15/2017  CTs7/26/2019-mild progression lungs and liver; significantly improved rectal wall thickening; resolution of perirectal nodal adenopathy.  Cycle 6 FOLFIRI 12/29/2017  Cycle 7 FOLFIRI 01/12/2018  Cycle 8 FOLFIRI 01/26/2018  Cycle 9 FOLFIRI 02/09/2018  Cycle 10 FOLFIRI 02/23/2018  Restaging CTs 03/06/2018-majority of the pulmonary nodules are similar in size, at least 2 of the nodules are minimally increased in size. Similar appearing lesions within the liver. Similar-appearing mild wall thickening of the rectum.  Cycle 11FOLFIRI 03/09/2018  Cycle 12 FOLFIRI 03/23/2018  Cycle 13 FOLFIRI 04/06/2018  Cycle 14 FOLFIRI 04/20/2018 2.Multiple colon polyps on the colonoscopy 05/22/2015, tubular villous adenoma and a tubular adenoma were removed 3.Anemia-likely secondary to rectal bleeding.  4.Family history of multiple cancers including breast and prostate cancer 5.Transient right arm numbness 06/20/2015 6. Port-A-Cath placement 07/06/2015 by Dr. Marcello Moores 7. Mild  neutropenia secondary to chemotherapy following cycle 1 FOLFOX. Neulasta added with cycle 2. 8. Delayed nausea following chemotherapy-emend added with cycle 3 FOLFOX;Emend added with cycle 12 FOLFIRI. 9. Early oxaliplatin neuropathy 10. 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 Xeloda resumed at a reduced dose. 11. Bilateral hand weakness. Question related to previous oxaliplatin. Referred to neurology 06/17/2016. 12. Hypertension-amlodipine started 07/08/2016 13. Rash secondary to PANITUMUMAB.  14. Neutropenia following cycle 1 FOLFIRI/PANITUMUMAB; neutropenia following cycle 4 FOLFIRI/PANITUMUMAB.  Disposition: Hunter Walker appears stable.  There is no clinical evidence of disease progression.  Plan to proceed with cycle 14 FOLFIRI today as scheduled.  The plan is for restaging CTs after the cycle of chemotherapy on 05/04/2018.  We reviewed the CBC from today.  Counts are adequate for treatment.  He will return for lab, follow-up and the next cycle of FOLFIRI in 2 weeks.  He will contact the office in the interim with any problems.    Ned Card ANP/GNP-BC   04/20/2018  11:07 AM

## 2018-04-21 ENCOUNTER — Other Ambulatory Visit: Payer: Self-pay | Admitting: *Deleted

## 2018-04-21 ENCOUNTER — Telehealth: Payer: Self-pay | Admitting: Nurse Practitioner

## 2018-04-21 DIAGNOSIS — C20 Malignant neoplasm of rectum: Secondary | ICD-10-CM

## 2018-04-21 MED ORDER — AMLODIPINE BESYLATE 5 MG PO TABS: 5.0000 mg | ORAL_TABLET | Freq: Every day | ORAL | 2 refills | Status: DC

## 2018-04-21 NOTE — Telephone Encounter (Signed)
R/s appt per 1/18 los - pt to get an updated schedule next visit.

## 2018-04-22 ENCOUNTER — Inpatient Hospital Stay: Payer: Medicare HMO

## 2018-04-22 VITALS — BP 117/81 | HR 77 | Temp 97.8°F | Resp 18

## 2018-04-22 DIAGNOSIS — C7800 Secondary malignant neoplasm of unspecified lung: Secondary | ICD-10-CM | POA: Diagnosis not present

## 2018-04-22 DIAGNOSIS — C787 Secondary malignant neoplasm of liver and intrahepatic bile duct: Secondary | ICD-10-CM | POA: Diagnosis not present

## 2018-04-22 DIAGNOSIS — Z5111 Encounter for antineoplastic chemotherapy: Secondary | ICD-10-CM | POA: Diagnosis not present

## 2018-04-22 DIAGNOSIS — Z5189 Encounter for other specified aftercare: Secondary | ICD-10-CM | POA: Diagnosis not present

## 2018-04-22 DIAGNOSIS — C20 Malignant neoplasm of rectum: Secondary | ICD-10-CM

## 2018-04-22 DIAGNOSIS — D701 Agranulocytosis secondary to cancer chemotherapy: Secondary | ICD-10-CM | POA: Diagnosis not present

## 2018-04-22 DIAGNOSIS — R531 Weakness: Secondary | ICD-10-CM | POA: Diagnosis not present

## 2018-04-22 DIAGNOSIS — R110 Nausea: Secondary | ICD-10-CM | POA: Diagnosis not present

## 2018-04-22 DIAGNOSIS — I10 Essential (primary) hypertension: Secondary | ICD-10-CM | POA: Diagnosis not present

## 2018-04-22 MED ORDER — SODIUM CHLORIDE 0.9% FLUSH
10.0000 mL | INTRAVENOUS | Status: DC | PRN
  Administered 2018-04-22: 10 mL
  Filled 2018-04-22: qty 10

## 2018-04-22 MED ORDER — HEPARIN SOD (PORK) LOCK FLUSH 100 UNIT/ML IV SOLN
500.0000 [IU] | Freq: Once | INTRAVENOUS | Status: AC | PRN
  Administered 2018-04-22: 500 [IU]
  Filled 2018-04-22: qty 5

## 2018-04-22 MED ORDER — PEGFILGRASTIM-CBQV 6 MG/0.6ML ~~LOC~~ SOSY
6.0000 mg | PREFILLED_SYRINGE | Freq: Once | SUBCUTANEOUS | Status: AC
  Administered 2018-04-22: 6 mg via SUBCUTANEOUS

## 2018-04-22 MED ORDER — PEGFILGRASTIM-CBQV 6 MG/0.6ML ~~LOC~~ SOSY
PREFILLED_SYRINGE | SUBCUTANEOUS | Status: AC
  Filled 2018-04-22: qty 0.6

## 2018-05-03 ENCOUNTER — Other Ambulatory Visit: Payer: Self-pay | Admitting: Oncology

## 2018-05-04 ENCOUNTER — Inpatient Hospital Stay: Payer: Medicare HMO

## 2018-05-04 ENCOUNTER — Ambulatory Visit: Payer: Medicare HMO

## 2018-05-04 ENCOUNTER — Inpatient Hospital Stay: Payer: Medicare HMO | Attending: Nurse Practitioner

## 2018-05-04 ENCOUNTER — Inpatient Hospital Stay (HOSPITAL_BASED_OUTPATIENT_CLINIC_OR_DEPARTMENT_OTHER): Payer: Medicare HMO | Admitting: Nurse Practitioner

## 2018-05-04 ENCOUNTER — Encounter: Payer: Self-pay | Admitting: Nurse Practitioner

## 2018-05-04 VITALS — BP 134/70 | HR 74 | Temp 98.5°F | Resp 17 | Ht 73.0 in | Wt 187.5 lb

## 2018-05-04 DIAGNOSIS — G620 Drug-induced polyneuropathy: Secondary | ICD-10-CM

## 2018-05-04 DIAGNOSIS — R531 Weakness: Secondary | ICD-10-CM | POA: Insufficient documentation

## 2018-05-04 DIAGNOSIS — Z803 Family history of malignant neoplasm of breast: Secondary | ICD-10-CM | POA: Insufficient documentation

## 2018-05-04 DIAGNOSIS — Z5189 Encounter for other specified aftercare: Secondary | ICD-10-CM | POA: Insufficient documentation

## 2018-05-04 DIAGNOSIS — C20 Malignant neoplasm of rectum: Secondary | ICD-10-CM

## 2018-05-04 DIAGNOSIS — D701 Agranulocytosis secondary to cancer chemotherapy: Secondary | ICD-10-CM

## 2018-05-04 DIAGNOSIS — C787 Secondary malignant neoplasm of liver and intrahepatic bile duct: Secondary | ICD-10-CM | POA: Insufficient documentation

## 2018-05-04 DIAGNOSIS — Z8042 Family history of malignant neoplasm of prostate: Secondary | ICD-10-CM | POA: Insufficient documentation

## 2018-05-04 DIAGNOSIS — Z5111 Encounter for antineoplastic chemotherapy: Secondary | ICD-10-CM | POA: Diagnosis present

## 2018-05-04 DIAGNOSIS — C7800 Secondary malignant neoplasm of unspecified lung: Secondary | ICD-10-CM | POA: Diagnosis not present

## 2018-05-04 DIAGNOSIS — I10 Essential (primary) hypertension: Secondary | ICD-10-CM

## 2018-05-04 DIAGNOSIS — Z452 Encounter for adjustment and management of vascular access device: Secondary | ICD-10-CM | POA: Diagnosis present

## 2018-05-04 DIAGNOSIS — Z95828 Presence of other vascular implants and grafts: Secondary | ICD-10-CM

## 2018-05-04 DIAGNOSIS — R112 Nausea with vomiting, unspecified: Secondary | ICD-10-CM | POA: Insufficient documentation

## 2018-05-04 LAB — CMP (CANCER CENTER ONLY)
ALT: 12 U/L (ref 0–44)
AST: 15 U/L (ref 15–41)
Albumin: 3.6 g/dL (ref 3.5–5.0)
Alkaline Phosphatase: 153 U/L — ABNORMAL HIGH (ref 38–126)
Anion gap: 8 (ref 5–15)
BUN: 15 mg/dL (ref 8–23)
CO2: 25 mmol/L (ref 22–32)
Calcium: 9.3 mg/dL (ref 8.9–10.3)
Chloride: 109 mmol/L (ref 98–111)
Creatinine: 1.4 mg/dL — ABNORMAL HIGH (ref 0.61–1.24)
GFR, Est AFR Am: >60 mL/min
GFR, Estimated: 52 mL/min — ABNORMAL LOW
Glucose, Bld: 96 mg/dL (ref 70–99)
Potassium: 4.1 mmol/L (ref 3.5–5.1)
Sodium: 142 mmol/L (ref 135–145)
Total Bilirubin: 0.3 mg/dL (ref 0.3–1.2)
Total Protein: 7.7 g/dL (ref 6.5–8.1)

## 2018-05-04 LAB — CBC WITH DIFFERENTIAL (CANCER CENTER ONLY)
Abs Immature Granulocytes: 0.05 10*3/uL (ref 0.00–0.07)
Basophils Absolute: 0 10*3/uL (ref 0.0–0.1)
Basophils Relative: 1 %
Eosinophils Absolute: 0.1 10*3/uL (ref 0.0–0.5)
Eosinophils Relative: 1 %
HCT: 42 % (ref 39.0–52.0)
Hemoglobin: 13.5 g/dL (ref 13.0–17.0)
Immature Granulocytes: 1 %
Lymphocytes Relative: 18 %
Lymphs Abs: 1.6 10*3/uL (ref 0.7–4.0)
MCH: 32.2 pg (ref 26.0–34.0)
MCHC: 32.1 g/dL (ref 30.0–36.0)
MCV: 100.2 fL — ABNORMAL HIGH (ref 80.0–100.0)
Monocytes Absolute: 0.8 10*3/uL (ref 0.1–1.0)
Monocytes Relative: 9 %
Neutro Abs: 6.1 10*3/uL (ref 1.7–7.7)
Neutrophils Relative %: 70 %
Platelet Count: 234 10*3/uL (ref 150–400)
RBC: 4.19 MIL/uL — ABNORMAL LOW (ref 4.22–5.81)
RDW: 16.3 % — ABNORMAL HIGH (ref 11.5–15.5)
WBC Count: 8.7 10*3/uL (ref 4.0–10.5)
nRBC: 0 % (ref 0.0–0.2)

## 2018-05-04 LAB — CEA (IN HOUSE-CHCC): CEA (CHCC-In House): 6.04 ng/mL — ABNORMAL HIGH (ref 0.00–5.00)

## 2018-05-04 MED ORDER — SODIUM CHLORIDE 0.9% FLUSH
10.0000 mL | Freq: Once | INTRAVENOUS | Status: AC
  Administered 2018-05-04: 10 mL
  Filled 2018-05-04: qty 10

## 2018-05-04 MED ORDER — ATROPINE SULFATE 1 MG/ML IJ SOLN
0.5000 mg | Freq: Once | INTRAMUSCULAR | Status: AC | PRN
  Administered 2018-05-04: 0.5 mg via INTRAVENOUS

## 2018-05-04 MED ORDER — FLUOROURACIL CHEMO INJECTION 2.5 GM/50ML
300.0000 mg/m2 | Freq: Once | INTRAVENOUS | Status: AC
  Administered 2018-05-04: 600 mg via INTRAVENOUS
  Filled 2018-05-04: qty 12

## 2018-05-04 MED ORDER — PALONOSETRON HCL INJECTION 0.25 MG/5ML
0.2500 mg | Freq: Once | INTRAVENOUS | Status: AC
  Administered 2018-05-04: 0.25 mg via INTRAVENOUS

## 2018-05-04 MED ORDER — DEXAMETHASONE SODIUM PHOSPHATE 10 MG/ML IJ SOLN
10.0000 mg | Freq: Once | INTRAMUSCULAR | Status: AC
  Administered 2018-05-04: 10 mg via INTRAVENOUS

## 2018-05-04 MED ORDER — IRINOTECAN HCL CHEMO INJECTION 100 MG/5ML
125.0000 mg/m2 | Freq: Once | INTRAVENOUS | Status: AC
  Administered 2018-05-04: 260 mg via INTRAVENOUS
  Filled 2018-05-04: qty 13

## 2018-05-04 MED ORDER — SODIUM CHLORIDE 0.9 % IV SOLN
2425.0000 mg/m2 | INTRAVENOUS | Status: DC
  Administered 2018-05-04: 5000 mg via INTRAVENOUS
  Filled 2018-05-04: qty 100

## 2018-05-04 MED ORDER — SODIUM CHLORIDE 0.9 % IV SOLN
Freq: Once | INTRAVENOUS | Status: AC
  Administered 2018-05-04: 14:00:00 via INTRAVENOUS
  Filled 2018-05-04: qty 250

## 2018-05-04 MED ORDER — PALONOSETRON HCL INJECTION 0.25 MG/5ML
INTRAVENOUS | Status: AC
  Filled 2018-05-04: qty 5

## 2018-05-04 MED ORDER — DEXAMETHASONE SODIUM PHOSPHATE 10 MG/ML IJ SOLN
INTRAMUSCULAR | Status: AC
  Filled 2018-05-04: qty 1

## 2018-05-04 MED ORDER — LEUCOVORIN CALCIUM INJECTION 350 MG
300.0000 mg/m2 | Freq: Once | INTRAVENOUS | Status: AC
  Administered 2018-05-04: 622 mg via INTRAVENOUS
  Filled 2018-05-04: qty 31.1

## 2018-05-04 MED ORDER — ONDANSETRON HCL 8 MG PO TABS: 8.0000 mg | ORAL_TABLET | Freq: Three times a day (TID) | ORAL | 0 refills | Status: AC | PRN

## 2018-05-04 MED ORDER — ATROPINE SULFATE 1 MG/ML IJ SOLN
INTRAMUSCULAR | Status: AC
  Filled 2018-05-04: qty 1

## 2018-05-04 MED ORDER — SODIUM CHLORIDE 0.9% FLUSH
10.0000 mL | INTRAVENOUS | Status: DC | PRN
  Filled 2018-05-04: qty 10

## 2018-05-04 MED ORDER — HEPARIN SOD (PORK) LOCK FLUSH 100 UNIT/ML IV SOLN
500.0000 [IU] | Freq: Once | INTRAVENOUS | Status: DC | PRN
  Filled 2018-05-04: qty 5

## 2018-05-04 NOTE — Progress Notes (Signed)
[facility] OFFICE PROGRESS NOTE   Diagnosis:  Rectal cancer  INTERVAL HISTORY:   Mr. Kalman returns as scheduled.  He completed cycle 14 FOLFIRI 04/20/2018.  He had nausea beginning around the time of pump DC with one episode of vomiting.  He took Compazine with partial relief.  No mouth sores.  No diarrhea.  No bleeding.  Bowels moving regularly.  No abdominal or rectal pain.  Objective:  Vital signs in last 24 hours:  Blood pressure 134/70, pulse 74, temperature 98.5 F (36.9 C), temperature source Oral, resp. rate 17, height '6\' 1"'  (1.854 m), weight 187 lb 8 oz (85 kg), SpO2 100 %.    HEENT: No thrush or ulcers. Resp: Lungs clear bilaterally. Cardio: Regular rate and rhythm. GI: Abdomen soft and nontender.  No hepatomegaly. Vascular: No leg edema. Port-A-Cath without erythema.  Lab Results:  Lab Results  Component Value Date   WBC 8.7 05/04/2018   HGB 13.5 05/04/2018   HCT 42.0 05/04/2018   MCV 100.2 (H) 05/04/2018   PLT 234 05/04/2018   NEUTROABS 6.1 05/04/2018    Imaging:  No results found.  Medications: I have reviewed the patient's current medications.  Assessment/Plan: 1. Rectal cancer, invasive adenocarcinoma confirmed at colonoscopy 05/22/2015, EUS staging 06/01/2015 revealed a uT3,N1 lesion at 7 cm from the anal verge  Staging CT abdomen/pelvis 05/15/2015 confirmed a mass at the rectosigmoid colon, a suspicious perirectal lymph node, and a 6 mm right liver lesion felt to most likely be a cyst  CT chest 05/30/2015 with indeterminate pulmonary nodules and multiple liver lesions suspicious for metastases  Ultrasound 06/13/2014 with no liver lesions identified  MRI abdomen 06/21/2015 consistent with multiple liver metastases  CT biopsy of liver lesion 06/26/2015. Pathology showed metastatic adenocarcinoma consistent with colonic primary.MSI-stable, low mutation burden, no RAS or BRAFmutation  Cycle 1 FOLFOX 07/10/2015  Cycle 2  FOLFOX plus Avastin 07/24/2015 with Neulasta support  Cycle 3 FOLFOX plus Avastin 08/07/2015  Cycle 4 FOLFOX plus Avastin 08/21/2015  cycle 5 FOLFOX plus Avastin 09/04/2015  Restaging CTs 09/15/2015 revealed a decrease in the size of liver lesions, decreased rectal primary, stable/decreased size of tiny lung nodules  Cycle 6 FOLFOX plus Avastin 09/18/2015  Cycle 7 FOLFOX plus Avastin 10/02/2015  Cycle 8 FOLFOX plus Avastin 10/16/2015  Cycle 9 FOLFOX plus Avastin 11/06/2015  Cycle 10 FOLFOX plus Avastin 11/20/2015  CTs 12/07/2015-mild decrease in wall thickening at the rectum, decrease in tiny hepatic metastases, stable lung nodules, no evidence of progressive metastatic disease, new left lower lobe infiltrate  Treatment changed to every three-week Avastin with every other week Xeloda beginning 12/11/2015  Restaging CTs 04/12/2016-interval increase in size of adjacent right upper lobe pulmonary nodules and right lower lobe pulmonary nodule; unchanged 5 mm lesion right hepatic lobe; no additional visualized hepatic metastasis. Grossly unchanged wall thickening of the rectum.  Continuation of Xeloda every other week and every three-week AvastinXeloda placed on hold 05/06/2016 due to hand-foot syndrome  Xeloda resumed 05/28/2016 1500 mg every morning and 1000 mg every afternoon 7 days on/7 days off  CT 09/27/2016-enlargement of lung nodules and new liver lesions, stable rectal mass  Cycle 1 FOLFIRI/panitumumab 10/08/2016  Cycle 2 panitumumab 10/23/2016 (FOLFIRI held secondary to neutropenia)  Cycle 3 FOLFIRI/PANITUMUMAB 11/04/2016  Cycle 4 FOLFIRI/panitumumab 11/18/2016  Cycle 5 FOLFIRI/panitumumab 12/16/2016  Cycle 6 FOLFIRI/panitumumab 12/31/2016  CTs 01/16/2017-decreased size of pulmonary nodules and liver lesions. Decreased perirectal lymph node  Cycle 7 FOLFIRI/PANITUMUMAB 01/28/2017  Cycle 8FOLFIRI/PANITUMUMAB 02/10/2017  Cycle  9 FOLFIRI/PANITUMUMAB  02/24/2017  Cycle 10 FOLFIRI/panitumumab 03/10/2017 (irinotecan held secondary to neutropenia)  Cycle 11 FOLFIRI/panitumumab 03/24/2017  Cycle12 FOLFIRI/Panitumumab 04/07/2017  Restaging CTs 04/25/2017-stable rectal mass with adjacent 8 mm left perirectal nodal metastasis. Prior hepatic metastases not visualized on the current study. Indeterminate 16 mm enhancing lesion present in segment 6, indeterminate. Minimal residual nodularity in the right upper lobe. No new/progressive pulmonary metastases.  Maintenance Xeloda/panitumumab 04/28/2017  Restaging CTs 08/18/2017-new and enlarging bilateral lung nodules; 2 hyperattenuating lesions in the liver appear similar to prior study; progression of wall thickening in the rectum with enlarging perirectal lymph nodes.  Initiation of radiation/Xeloda 09/01/2017, completed 09/22/2017  Cycle 1 FOLFIRI/Panitumumab 10/13/2017  Cycle 2 FOLFIRI 11/03/2017  Cycle 3 FOLFIRI 11/17/2017  Cycle 4 FOLFIRI 12/01/2017  Cycle 5 FOLFIRI 12/15/2017  CTs7/26/2019-mild progression lungs and liver; significantly improved rectal wall thickening; resolution of perirectal nodal adenopathy.  Cycle 6 FOLFIRI 12/29/2017  Cycle 7 FOLFIRI 01/12/2018  Cycle 8 FOLFIRI 01/26/2018  Cycle 9 FOLFIRI 02/09/2018  Cycle 10 FOLFIRI 02/23/2018  Restaging CTs 03/06/2018-majority of the pulmonary nodules are similar in size, at least 2 of the nodules are minimally increased in size. Similar appearing lesions within the liver. Similar-appearing mild wall thickening of the rectum.  Cycle 11FOLFIRI 03/09/2018  Cycle 12 FOLFIRI 03/23/2018  Cycle 13 FOLFIRI 04/06/2018  Cycle 14 FOLFIRI 04/20/2018  Cycle 15 FOLFIRI 05/04/2018 2.Multiple colon polyps on the colonoscopy 05/22/2015, tubular villous adenoma and a tubular adenoma were removed 3.Anemia-likely secondary to rectal bleeding.  4.Family history of multiple cancers including breast and prostate cancer 5.Transient right arm  numbness 06/20/2015 6. Port-A-Cath placement 07/06/2015 by Dr. Marcello Moores 7. Mild neutropenia secondary to chemotherapy following cycle 1 FOLFOX. Neulasta added with cycle 2. 8. Delayed nausea following chemotherapy-emend added with cycle 3 FOLFOX;Emend added with cycle 12 FOLFIRI. 9. Early oxaliplatin neuropathy 10. 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 Xeloda resumed at a reduced dose. 11. Bilateral hand weakness. Question related to previous oxaliplatin. Referred to neurology 06/17/2016. 12. Hypertension-amlodipine started 07/08/2016 13. Rash secondary to PANITUMUMAB.  14. Neutropenia following cycle 1 FOLFIRI/PANITUMUMAB; neutropenia following cycle 4 FOLFIRI/PANITUMUMAB.  Disposition: [name] appears stable.  He has completed 14 cycles of FOLFIRI.  He continues to tolerate well overall.  Plan to proceed with cycle 15 today as scheduled.  For the delayed nausea I sent a prescription to his pharmacy for Zofran 8 mg every 8 hours as needed.  He understands he should not begin the Zofran for 72 hours due to receiving Aloxi on day 1.  We reviewed the CBC from today.  Counts are adequate for treatment.  Chemistry panel is pending.  He will undergo restaging CTs prior to his next visit.  He will return for lab, follow-up and chemotherapy in 2 weeks.  He will contact the office in the interim with any problems.    Ned Card ANP/GNP-BC   05/04/2018  10:46 AM

## 2018-05-04 NOTE — Patient Instructions (Signed)
[facility] Discharge Instructions for Patients Receiving Chemotherapy  Today you received the following chemotherapy agents:  Leucovorin, camptosar, fluorouracil,  To help prevent nausea and vomiting after your treatment, we encourage you to take your nausea medication as prescribed.  Return @ 2:20 pm on 05/06/18  If you develop nausea and vomiting that is not controlled by your nausea medication, call the clinic.   BELOW ARE SYMPTOMS THAT SHOULD BE REPORTED IMMEDIATELY:  *FEVER GREATER THAN 100.5 F  *CHILLS WITH OR WITHOUT FEVER  NAUSEA AND VOMITING THAT IS NOT CONTROLLED WITH YOUR NAUSEA MEDICATION  *UNUSUAL SHORTNESS OF BREATH  *UNUSUAL BRUISING OR BLEEDING  TENDERNESS IN MOUTH AND THROAT WITH OR WITHOUT PRESENCE OF ULCERS  *URINARY PROBLEMS  *BOWEL PROBLEMS  UNUSUAL RASH Items with * indicate a potential emergency and should be followed up as soon as possible.  Feel free to call the clinic should you have any questions or concerns. The clinic phone number is [phone].  Please show the [facility] at check-in to the Emergency Department and triage nurse.

## 2018-05-05 ENCOUNTER — Other Ambulatory Visit: Payer: Self-pay | Admitting: Nurse Practitioner

## 2018-05-05 DIAGNOSIS — C20 Malignant neoplasm of rectum: Secondary | ICD-10-CM

## 2018-05-06 ENCOUNTER — Inpatient Hospital Stay: Payer: Medicare HMO

## 2018-05-06 VITALS — BP 128/68 | HR 72 | Temp 98.5°F | Resp 18

## 2018-05-06 DIAGNOSIS — C20 Malignant neoplasm of rectum: Secondary | ICD-10-CM

## 2018-05-06 DIAGNOSIS — I10 Essential (primary) hypertension: Secondary | ICD-10-CM | POA: Diagnosis not present

## 2018-05-06 DIAGNOSIS — Z803 Family history of malignant neoplasm of breast: Secondary | ICD-10-CM | POA: Diagnosis not present

## 2018-05-06 DIAGNOSIS — R531 Weakness: Secondary | ICD-10-CM | POA: Diagnosis not present

## 2018-05-06 DIAGNOSIS — D701 Agranulocytosis secondary to cancer chemotherapy: Secondary | ICD-10-CM | POA: Diagnosis not present

## 2018-05-06 DIAGNOSIS — G620 Drug-induced polyneuropathy: Secondary | ICD-10-CM | POA: Diagnosis not present

## 2018-05-06 DIAGNOSIS — R112 Nausea with vomiting, unspecified: Secondary | ICD-10-CM | POA: Diagnosis not present

## 2018-05-06 DIAGNOSIS — C787 Secondary malignant neoplasm of liver and intrahepatic bile duct: Secondary | ICD-10-CM | POA: Diagnosis not present

## 2018-05-06 DIAGNOSIS — C7800 Secondary malignant neoplasm of unspecified lung: Secondary | ICD-10-CM | POA: Diagnosis not present

## 2018-05-06 MED ORDER — PEGFILGRASTIM-CBQV 6 MG/0.6ML ~~LOC~~ SOSY
PREFILLED_SYRINGE | SUBCUTANEOUS | Status: AC
  Filled 2018-05-06: qty 0.6

## 2018-05-06 MED ORDER — SODIUM CHLORIDE 0.9% FLUSH
10.0000 mL | INTRAVENOUS | Status: DC | PRN
  Administered 2018-05-06: 10 mL
  Filled 2018-05-06: qty 10

## 2018-05-06 MED ORDER — HEPARIN SOD (PORK) LOCK FLUSH 100 UNIT/ML IV SOLN
500.0000 [IU] | Freq: Once | INTRAVENOUS | Status: AC | PRN
  Administered 2018-05-06: 500 [IU]
  Filled 2018-05-06: qty 5

## 2018-05-06 MED ORDER — PEGFILGRASTIM-CBQV 6 MG/0.6ML ~~LOC~~ SOSY
6.0000 mg | PREFILLED_SYRINGE | Freq: Once | SUBCUTANEOUS | Status: AC
  Administered 2018-05-06: 6 mg via SUBCUTANEOUS

## 2018-05-15 ENCOUNTER — Encounter (HOSPITAL_COMMUNITY): Payer: Self-pay

## 2018-05-15 ENCOUNTER — Ambulatory Visit (HOSPITAL_COMMUNITY)
Admission: RE | Admit: 2018-05-15 | Discharge: 2018-05-15 | Disposition: A | Discharge status: Home / Self Care | Payer: Medicare HMO | Source: Ambulatory Visit | Attending: Nurse Practitioner | Admitting: Nurse Practitioner

## 2018-05-15 ENCOUNTER — Inpatient Hospital Stay: Payer: Medicare HMO

## 2018-05-15 DIAGNOSIS — Z803 Family history of malignant neoplasm of breast: Secondary | ICD-10-CM | POA: Diagnosis not present

## 2018-05-15 DIAGNOSIS — R112 Nausea with vomiting, unspecified: Secondary | ICD-10-CM | POA: Diagnosis not present

## 2018-05-15 DIAGNOSIS — C7800 Secondary malignant neoplasm of unspecified lung: Secondary | ICD-10-CM | POA: Diagnosis not present

## 2018-05-15 DIAGNOSIS — C787 Secondary malignant neoplasm of liver and intrahepatic bile duct: Secondary | ICD-10-CM | POA: Diagnosis not present

## 2018-05-15 DIAGNOSIS — R531 Weakness: Secondary | ICD-10-CM | POA: Diagnosis not present

## 2018-05-15 DIAGNOSIS — D701 Agranulocytosis secondary to cancer chemotherapy: Secondary | ICD-10-CM | POA: Diagnosis not present

## 2018-05-15 DIAGNOSIS — C218 Malignant neoplasm of overlapping sites of rectum, anus and anal canal: Secondary | ICD-10-CM | POA: Diagnosis not present

## 2018-05-15 DIAGNOSIS — I10 Essential (primary) hypertension: Secondary | ICD-10-CM | POA: Diagnosis not present

## 2018-05-15 DIAGNOSIS — C20 Malignant neoplasm of rectum: Secondary | ICD-10-CM

## 2018-05-15 DIAGNOSIS — Z95828 Presence of other vascular implants and grafts: Secondary | ICD-10-CM

## 2018-05-15 DIAGNOSIS — G620 Drug-induced polyneuropathy: Secondary | ICD-10-CM | POA: Diagnosis not present

## 2018-05-15 LAB — CMP (CANCER CENTER ONLY)
ALT: 13 U/L (ref 0–44)
AST: 14 U/L — ABNORMAL LOW (ref 15–41)
Albumin: 3.7 g/dL (ref 3.5–5.0)
Alkaline Phosphatase: 171 U/L — ABNORMAL HIGH (ref 38–126)
Anion gap: 9 (ref 5–15)
BUN: 10 mg/dL (ref 8–23)
CO2: 24 mmol/L (ref 22–32)
Calcium: 8.9 mg/dL (ref 8.9–10.3)
Chloride: 106 mmol/L (ref 98–111)
Creatinine: 1.27 mg/dL — ABNORMAL HIGH (ref 0.61–1.24)
GFR, Est AFR Am: >60 mL/min (ref >60)
GFR, Estimated: 58 mL/min — ABNORMAL LOW (ref >60)
Glucose, Bld: 98 mg/dL (ref 70–99)
Potassium: 4.1 mmol/L (ref 3.5–5.1)
Sodium: 139 mmol/L (ref 135–145)
Total Bilirubin: 0.4 mg/dL (ref 0.3–1.2)
Total Protein: 7.6 g/dL (ref 6.5–8.1)

## 2018-05-15 MED ORDER — HEPARIN SOD (PORK) LOCK FLUSH 100 UNIT/ML IV SOLN
500.0000 [IU] | Freq: Once | INTRAVENOUS | Status: AC
  Administered 2018-05-15: 500 [IU]
  Filled 2018-05-15: qty 5

## 2018-05-15 MED ORDER — SODIUM CHLORIDE 0.9% FLUSH
10.0000 mL | Freq: Once | INTRAVENOUS | Status: AC
  Administered 2018-05-15: 10 mL
  Filled 2018-05-15: qty 10

## 2018-05-15 MED ORDER — IOHEXOL 300 MG/ML  SOLN
100.0000 mL | Freq: Once | INTRAMUSCULAR | Status: AC | PRN
  Administered 2018-05-15: 100 mL via INTRAVENOUS

## 2018-05-15 MED ORDER — SODIUM CHLORIDE (PF) 0.9 % IJ SOLN
INTRAMUSCULAR | Status: AC
  Filled 2018-05-15: qty 50

## 2018-05-15 NOTE — Patient Instructions (Signed)

## 2018-05-17 ENCOUNTER — Other Ambulatory Visit: Payer: Self-pay | Admitting: Oncology

## 2018-05-18 ENCOUNTER — Inpatient Hospital Stay (HOSPITAL_BASED_OUTPATIENT_CLINIC_OR_DEPARTMENT_OTHER): Payer: Medicare HMO | Admitting: Oncology

## 2018-05-18 ENCOUNTER — Inpatient Hospital Stay: Payer: Medicare HMO

## 2018-05-18 VITALS — BP 131/89 | HR 68 | Temp 98.5°F | Resp 18 | Ht 73.0 in | Wt 187.5 lb

## 2018-05-18 DIAGNOSIS — I10 Essential (primary) hypertension: Secondary | ICD-10-CM | POA: Diagnosis not present

## 2018-05-18 DIAGNOSIS — D701 Agranulocytosis secondary to cancer chemotherapy: Secondary | ICD-10-CM | POA: Diagnosis not present

## 2018-05-18 DIAGNOSIS — Z803 Family history of malignant neoplasm of breast: Secondary | ICD-10-CM

## 2018-05-18 DIAGNOSIS — C20 Malignant neoplasm of rectum: Secondary | ICD-10-CM

## 2018-05-18 DIAGNOSIS — G620 Drug-induced polyneuropathy: Secondary | ICD-10-CM

## 2018-05-18 DIAGNOSIS — C787 Secondary malignant neoplasm of liver and intrahepatic bile duct: Secondary | ICD-10-CM | POA: Diagnosis not present

## 2018-05-18 DIAGNOSIS — R531 Weakness: Secondary | ICD-10-CM | POA: Diagnosis not present

## 2018-05-18 DIAGNOSIS — R112 Nausea with vomiting, unspecified: Secondary | ICD-10-CM | POA: Diagnosis not present

## 2018-05-18 DIAGNOSIS — Z8042 Family history of malignant neoplasm of prostate: Secondary | ICD-10-CM

## 2018-05-18 DIAGNOSIS — C7800 Secondary malignant neoplasm of unspecified lung: Secondary | ICD-10-CM

## 2018-05-18 DIAGNOSIS — Z95828 Presence of other vascular implants and grafts: Secondary | ICD-10-CM

## 2018-05-18 LAB — CBC WITH DIFFERENTIAL (CANCER CENTER ONLY)
Abs Immature Granulocytes: 0.08 10*3/uL — ABNORMAL HIGH (ref 0.00–0.07)
Basophils Absolute: 0.1 10*3/uL (ref 0.0–0.1)
Basophils Relative: 1 %
EOS PCT: 2 %
Eosinophils Absolute: 0.2 10*3/uL (ref 0.0–0.5)
HCT: 41.4 % (ref 39.0–52.0)
Hemoglobin: 13.2 g/dL (ref 13.0–17.0)
Immature Granulocytes: 1 %
Lymphocytes Relative: 14 %
Lymphs Abs: 1.3 10*3/uL (ref 0.7–4.0)
MCH: 32.3 pg (ref 26.0–34.0)
MCHC: 31.9 g/dL (ref 30.0–36.0)
MCV: 101.2 fL — ABNORMAL HIGH (ref 80.0–100.0)
Monocytes Absolute: 0.7 10*3/uL (ref 0.1–1.0)
Monocytes Relative: 7 %
Neutro Abs: 6.9 10*3/uL (ref 1.7–7.7)
Neutrophils Relative %: 75 %
Platelet Count: 233 10*3/uL (ref 150–400)
RBC: 4.09 MIL/uL — ABNORMAL LOW (ref 4.22–5.81)
RDW: 16.1 % — ABNORMAL HIGH (ref 11.5–15.5)
WBC Count: 9.2 10*3/uL (ref 4.0–10.5)
nRBC: 0 % (ref 0.0–0.2)

## 2018-05-18 LAB — CMP (CANCER CENTER ONLY)
ALT: 14 U/L (ref 0–44)
AST: 15 U/L (ref 15–41)
Albumin: 3.5 g/dL (ref 3.5–5.0)
Alkaline Phosphatase: 146 U/L — ABNORMAL HIGH (ref 38–126)
Anion gap: 10 (ref 5–15)
BUN: 15 mg/dL (ref 8–23)
CO2: 24 mmol/L (ref 22–32)
Calcium: 8.9 mg/dL (ref 8.9–10.3)
Chloride: 108 mmol/L (ref 98–111)
Creatinine: 1.39 mg/dL — ABNORMAL HIGH (ref 0.61–1.24)
GFR, Est AFR Am: >60 mL/min (ref >60)
GFR, Estimated: 52 mL/min — ABNORMAL LOW (ref >60)
GLUCOSE: 98 mg/dL (ref 70–99)
Potassium: 4.2 mmol/L (ref 3.5–5.1)
Sodium: 142 mmol/L (ref 135–145)
Total Bilirubin: 0.3 mg/dL (ref 0.3–1.2)
Total Protein: 7.5 g/dL (ref 6.5–8.1)

## 2018-05-18 MED ORDER — SODIUM CHLORIDE 0.9% FLUSH
10.0000 mL | INTRAVENOUS | Status: DC | PRN
  Administered 2018-05-18: 10 mL via INTRAVENOUS
  Filled 2018-05-18: qty 10

## 2018-05-18 MED ORDER — SODIUM CHLORIDE 0.9% FLUSH
10.0000 mL | Freq: Once | INTRAVENOUS | Status: AC
  Administered 2018-05-18: 10 mL
  Filled 2018-05-18: qty 10

## 2018-05-18 MED ORDER — HEPARIN SOD (PORK) LOCK FLUSH 100 UNIT/ML IV SOLN
500.0000 [IU] | Freq: Once | INTRAVENOUS | Status: AC
  Administered 2018-05-18: 500 [IU] via INTRAVENOUS
  Filled 2018-05-18: qty 5

## 2018-05-18 NOTE — Progress Notes (Signed)
[place] OFFICE PROGRESS NOTE   Diagnosis: Rectal cancer  INTERVAL HISTORY:   Hunter Walker returns for a scheduled visit.  He completed another cycle of FOLFIRI on 05/04/2018.  He reports one episode of emesis following chemotherapy.  No mouth sores or diarrhea.  He feels well.  No rectal pain.  Objective:  Vital signs in last 24 hours:  Blood pressure 131/89, pulse 68, temperature 98.5 F (36.9 C), temperature source Oral, resp. rate 18, height '6\' 1"'  (1.854 m), weight 187 lb 8 oz (85 kg), SpO2 100 %.    HEENT: No thrush or ulcers Resp: Lungs clear bilaterally Cardio: Regular rate and rhythm GI: No hepatosplenomegaly Vascular: No leg edema    Portacath/PICC-without erythema  Lab Results:  Lab Results  Component Value Date   WBC 9.2 05/18/2018   HGB 13.2 05/18/2018   HCT 41.4 05/18/2018   MCV 101.2 (H) 05/18/2018   PLT 233 05/18/2018   NEUTROABS 6.9 05/18/2018    CMP  Lab Results  Component Value Date   NA 142 05/18/2018   K 4.2 05/18/2018   CL 108 05/18/2018   CO2 24 05/18/2018   GLUCOSE 98 05/18/2018   BUN 15 05/18/2018   CREATININE 1.39 (H) 05/18/2018   CALCIUM 8.9 05/18/2018   PROT 7.5 05/18/2018   ALBUMIN 3.5 05/18/2018   AST 15 05/18/2018   ALT 14 05/18/2018   ALKPHOS 146 (H) 05/18/2018   BILITOT 0.3 05/18/2018   GFRNONAA 52 (L) 05/18/2018   GFRAA >60 05/18/2018    Lab Results  Component Value Date   CEA1 6.04 (H) 05/04/2018     Imaging:  Ct Chest W Contrast  Result Date: 05/15/2018 CLINICAL DATA:  Rectal cancer. EXAM: CT CHEST, ABDOMEN, AND PELVIS WITH CONTRAST TECHNIQUE: Multidetector CT imaging of the chest, abdomen and pelvis was performed following the standard protocol during bolus administration of intravenous contrast. CONTRAST:  157m OMNIPAQUE IOHEXOL 300 MG/ML  SOLN COMPARISON:  03/06/2018 FINDINGS: CT CHEST FINDINGS Cardiovascular: The heart size is normal. No substantial pericardial effusion. Atherosclerotic  calcification is noted in the wall of the thoracic aorta. Right Port-A-Cath tip positioned distal SVC. Mediastinum/Nodes: No mediastinal lymphadenopathy. There is no hilar lymphadenopathy. The esophagus has normal imaging features. There is no axillary lymphadenopathy. Lungs/Pleura: The central tracheobronchial airways are patent. Bilateral pulmonary nodules again noted. *Index right upper lobe nodule measured previously at 10 x 11 mm is stable at 10 x 11 mm today (44/4). *Index right lower lobe nodule measured previously at 8 mm is stable at 8 mm today (105/4). *Another index right lower lobe nodule measured previously at 7 mm is stable at 7 mm today (113/4). *Left lower lobe index nodule measured previously at 1.2 cm is 1.4 cm today. *Additional scattered tiny bilateral pulmonary nodules are also similar in the interval. No pleural effusion on today's study. No focal airspace consolidation. Musculoskeletal: No worrisome lytic or sclerotic osseous abnormality. CT ABDOMEN PELVIS FINDINGS Hepatobiliary: *Rim enhancing lesion in the lateral segment left liver is mildly progressed, measuring 3.8 x 3.4 cm today compared to 3.5 x 2.7 cm previously. *2nd lesion in the inferior right liver measures 2.7 x 2.9 cm today (67/2) which compares to 2.1 x 2.3 cm previously. *11 mm enhancing lesion identified in the anterior right liver (65/2) is similar to prior. There is no evidence for gallstones, gallbladder wall thickening, or pericholecystic fluid. No intrahepatic or extrahepatic biliary dilation. Pancreas: No focal mass lesion. No dilatation of the main duct. No intraparenchymal cyst.  No peripancreatic edema. Spleen: No splenomegaly. No focal mass lesion. Adrenals/Urinary Tract: No adrenal nodule or mass. Kidneys unremarkable. No evidence for hydroureter. The urinary bladder appears normal for the degree of distention. Stomach/Bowel: Stomach is nondistended. No gastric wall thickening. No evidence of outlet obstruction.  Duodenum is normally positioned as is the ligament of Treitz. No small bowel wall thickening. No small bowel dilatation. The terminal ileum is normal. Tip of the appendix is dilated up to 14 mm with the lumen filled with gas and debris (image 90/series 2). Appendiceal tip was 12 mm diameter previously and gas filled. No periappendiceal edema or inflammation. Circumferential wall thickening again noted in the rectum. Vascular/Lymphatic: There is abdominal aortic atherosclerosis without aneurysm. There is no gastrohepatic or hepatoduodenal ligament lymphadenopathy. No intraperitoneal or retroperitoneal lymphadenopathy. No pelvic sidewall lymphadenopathy. 6 x 13 mm left perirectal nodule (111/2) is mildly more conspicuous than on the prior study. Reproductive: Prostate gland is enlarged. Other: No intraperitoneal free fluid. Musculoskeletal: Mixed lytic and sclerotic 2.1 cm lesion in the right femoral head (121/2) is stable in the interval. No other suspicious lytic or sclerotic osseous abnormality identified. IMPRESSION: 1. No substantial interval change in bilateral pulmonary nodules. 1 particular nodule in the left base measures minimally increased in the interval, but the remaining index nodules are unchanged. 2. Mild interval increase in size of 2 index metastases within the liver. A third 11 mm hypervascular lesion in the right liver is stable. 3. Similar circumferential rectal wall thickening with 6 x 13 mm left perirectal nodule slightly more conspicuous than on the prior study. Close attention on follow-up recommended as metastatic disease a concern. 4. Dilated appendix measuring up to 14 mm diameter today compared to 12 mm previously. Appendix is filled with gas and debris in shows no periappendiceal edema or inflammation. Attention on follow-up recommended. Electronically Signed   By: Misty Stanley M.D.   On: 05/15/2018 16:48   Ct Abdomen Pelvis W Contrast  Result Date: 05/15/2018 CLINICAL DATA:  Rectal  cancer. EXAM: CT CHEST, ABDOMEN, AND PELVIS WITH CONTRAST TECHNIQUE: Multidetector CT imaging of the chest, abdomen and pelvis was performed following the standard protocol during bolus administration of intravenous contrast. CONTRAST:  141m OMNIPAQUE IOHEXOL 300 MG/ML  SOLN COMPARISON:  03/06/2018 FINDINGS: CT CHEST FINDINGS Cardiovascular: The heart size is normal. No substantial pericardial effusion. Atherosclerotic calcification is noted in the wall of the thoracic aorta. Right Port-A-Cath tip positioned distal SVC. Mediastinum/Nodes: No mediastinal lymphadenopathy. There is no hilar lymphadenopathy. The esophagus has normal imaging features. There is no axillary lymphadenopathy. Lungs/Pleura: The central tracheobronchial airways are patent. Bilateral pulmonary nodules again noted. *Index right upper lobe nodule measured previously at 10 x 11 mm is stable at 10 x 11 mm today (44/4). *Index right lower lobe nodule measured previously at 8 mm is stable at 8 mm today (105/4). *Another index right lower lobe nodule measured previously at 7 mm is stable at 7 mm today (113/4). *Left lower lobe index nodule measured previously at 1.2 cm is 1.4 cm today. *Additional scattered tiny bilateral pulmonary nodules are also similar in the interval. No pleural effusion on today's study. No focal airspace consolidation. Musculoskeletal: No worrisome lytic or sclerotic osseous abnormality. CT ABDOMEN PELVIS FINDINGS Hepatobiliary: *Rim enhancing lesion in the lateral segment left liver is mildly progressed, measuring 3.8 x 3.4 cm today compared to 3.5 x 2.7 cm previously. *2nd lesion in the inferior right liver measures 2.7 x 2.9 cm today (67/2) which compares to 2.1 x  2.3 cm previously. *11 mm enhancing lesion identified in the anterior right liver (65/2) is similar to prior. There is no evidence for gallstones, gallbladder wall thickening, or pericholecystic fluid. No intrahepatic or extrahepatic biliary dilation. Pancreas: No  focal mass lesion. No dilatation of the main duct. No intraparenchymal cyst. No peripancreatic edema. Spleen: No splenomegaly. No focal mass lesion. Adrenals/Urinary Tract: No adrenal nodule or mass. Kidneys unremarkable. No evidence for hydroureter. The urinary bladder appears normal for the degree of distention. Stomach/Bowel: Stomach is nondistended. No gastric wall thickening. No evidence of outlet obstruction. Duodenum is normally positioned as is the ligament of Treitz. No small bowel wall thickening. No small bowel dilatation. The terminal ileum is normal. Tip of the appendix is dilated up to 14 mm with the lumen filled with gas and debris (image 90/series 2). Appendiceal tip was 12 mm diameter previously and gas filled. No periappendiceal edema or inflammation. Circumferential wall thickening again noted in the rectum. Vascular/Lymphatic: There is abdominal aortic atherosclerosis without aneurysm. There is no gastrohepatic or hepatoduodenal ligament lymphadenopathy. No intraperitoneal or retroperitoneal lymphadenopathy. No pelvic sidewall lymphadenopathy. 6 x 13 mm left perirectal nodule (111/2) is mildly more conspicuous than on the prior study. Reproductive: Prostate gland is enlarged. Other: No intraperitoneal free fluid. Musculoskeletal: Mixed lytic and sclerotic 2.1 cm lesion in the right femoral head (121/2) is stable in the interval. No other suspicious lytic or sclerotic osseous abnormality identified. IMPRESSION: 1. No substantial interval change in bilateral pulmonary nodules. 1 particular nodule in the left base measures minimally increased in the interval, but the remaining index nodules are unchanged. 2. Mild interval increase in size of 2 index metastases within the liver. A third 11 mm hypervascular lesion in the right liver is stable. 3. Similar circumferential rectal wall thickening with 6 x 13 mm left perirectal nodule slightly more conspicuous than on the prior study. Close attention on  follow-up recommended as metastatic disease a concern. 4. Dilated appendix measuring up to 14 mm diameter today compared to 12 mm previously. Appendix is filled with gas and debris in shows no periappendiceal edema or inflammation. Attention on follow-up recommended. Electronically Signed   By: Misty Stanley M.D.   On: 05/15/2018 16:48    Medications: I have reviewed the patient's current medications.   Assessment/Plan:  1. Rectal cancer, invasive adenocarcinoma confirmed at colonoscopy 05/22/2015, EUS staging 06/01/2015 revealed a uT3,N1 lesion at 7 cm from the anal verge  Staging CT abdomen/pelvis 05/15/2015 confirmed a mass at the rectosigmoid colon, a suspicious perirectal lymph node, and a 6 mm right liver lesion felt to most likely be a cyst  CT chest 05/30/2015 with indeterminate pulmonary nodules and multiple liver lesions suspicious for metastases  Ultrasound 06/13/2014 with no liver lesions identified  MRI abdomen 06/21/2015 consistent with multiple liver metastases  CT biopsy of liver lesion 06/26/2015. Pathology showed metastatic adenocarcinoma consistent with colonic primary.MSI-stable, low mutation burden, no RAS or BRAFmutation  Cycle 1 FOLFOX 07/10/2015  Cycle 2 FOLFOX plus Avastin 07/24/2015 with Neulasta support  Cycle 3 FOLFOX plus Avastin 08/07/2015  Cycle 4 FOLFOX plus Avastin 08/21/2015  cycle 5 FOLFOX plus Avastin 09/04/2015  Restaging CTs 09/15/2015 revealed a decrease in the size of liver lesions, decreased rectal primary, stable/decreased size of tiny lung nodules  Cycle 6 FOLFOX plus Avastin 09/18/2015  Cycle 7 FOLFOX plus Avastin 10/02/2015  Cycle 8 FOLFOX plus Avastin 10/16/2015  Cycle 9 FOLFOX plus Avastin 11/06/2015  Cycle 10 FOLFOX plus Avastin 11/20/2015  CTs 12/07/2015-mild decrease  in wall thickening at the rectum, decrease in tiny hepatic metastases, stable lung nodules, no evidence of progressive metastatic disease, new left lower lobe  infiltrate  Treatment changed to every three-week Avastin with every other week Xeloda beginning 12/11/2015  Restaging CTs 04/12/2016-interval increase in size of adjacent right upper lobe pulmonary nodules and right lower lobe pulmonary nodule; unchanged 5 mm lesion right hepatic lobe; no additional visualized hepatic metastasis. Grossly unchanged wall thickening of the rectum.  Continuation of Xeloda every other week and every three-week AvastinXeloda placed on hold 05/06/2016 due to hand-foot syndrome  Xeloda resumed 05/28/2016 1500 mg every morning and 1000 mg every afternoon 7 days on/7 days off  CT 09/27/2016-enlargement of lung nodules and new liver lesions, stable rectal mass  Cycle 1 FOLFIRI/panitumumab 10/08/2016  Cycle 2 panitumumab 10/23/2016 (FOLFIRI held secondary to neutropenia)  Cycle 3 FOLFIRI/PANITUMUMAB 11/04/2016  Cycle 4 FOLFIRI/panitumumab 11/18/2016  Cycle 5 FOLFIRI/panitumumab 12/16/2016  Cycle 6 FOLFIRI/panitumumab 12/31/2016  CTs 01/16/2017-decreased size of pulmonary nodules and liver lesions. Decreased perirectal lymph node  Cycle 7 FOLFIRI/PANITUMUMAB 01/28/2017  Cycle 8FOLFIRI/PANITUMUMAB 02/10/2017  Cycle 9 FOLFIRI/PANITUMUMAB 02/24/2017  Cycle 10 FOLFIRI/panitumumab 03/10/2017 (irinotecan held secondary to neutropenia)  Cycle 11 FOLFIRI/panitumumab 03/24/2017  Cycle12 FOLFIRI/Panitumumab 04/07/2017  Restaging CTs 04/25/2017-stable rectal mass with adjacent 8 mm left perirectal nodal metastasis. Prior hepatic metastases not visualized on the current study. Indeterminate 16 mm enhancing lesion present in segment 6, indeterminate. Minimal residual nodularity in the right upper lobe. No new/progressive pulmonary metastases.  Maintenance Xeloda/panitumumab 04/28/2017  Restaging CTs 08/18/2017-new and enlarging bilateral lung nodules; 2 hyperattenuating lesions in the liver appear similar to prior study; progression of wall thickening in the  rectum with enlarging perirectal lymph nodes.  Initiation of radiation/Xeloda 09/01/2017, completed 09/22/2017  Cycle 1 FOLFIRI/Panitumumab 10/13/2017  Cycle 2 FOLFIRI 11/03/2017  Cycle 3 FOLFIRI 11/17/2017  Cycle 4 FOLFIRI 12/01/2017  Cycle 5 FOLFIRI 12/15/2017  CTs7/26/2019-mild progression lungs and liver; significantly improved rectal wall thickening; resolution of perirectal nodal adenopathy.  Cycle 6 FOLFIRI 12/29/2017  Cycle 7 FOLFIRI 01/12/2018  Cycle 8 FOLFIRI 01/26/2018  Cycle 9 FOLFIRI 02/09/2018  Cycle 10 FOLFIRI 02/23/2018  Restaging CTs 03/06/2018-majority of the pulmonary nodules are similar in size, at least 2 of the nodules are minimally increased in size. Similar appearing lesions within the liver. Similar-appearing mild wall thickening of the rectum.  Cycle 11FOLFIRI 03/09/2018  Cycle 12 FOLFIRI 03/23/2018  Cycle 13 FOLFIRI 04/06/2018  Cycle 14 FOLFIRI 04/20/2018  Cycle 15 FOLFIRI 05/04/2018  CT 05/15/2018-no change in pulmonary nodules, mild increase in size of 2 liver lesions, stable rectal wall thickening 2.Multiple colon polyps on the colonoscopy 05/22/2015, tubular villous adenoma and a tubular adenoma were removed 3.Anemia-likely secondary to rectal bleeding.  4.Family history of multiple cancers including breast and prostate cancer 5.Transient right arm numbness 06/20/2015 6. Port-A-Cath placement 07/06/2015 by Dr. Marcello Moores 7. Mild neutropenia secondary to chemotherapy following cycle 1 FOLFOX. Neulasta added with cycle 2. 8. Delayed nausea following chemotherapy-emend added with cycle 3 FOLFOX;Emend added with cycle 12 FOLFIRI. 9. Early oxaliplatin neuropathy 10. 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 Xeloda resumed at a reduced dose. 11. Bilateral hand weakness. Question related to previous oxaliplatin. Referred to neurology 06/17/2016. 12. Hypertension-amlodipine started 07/08/2016 13. Rash secondary to PANITUMUMAB.    14. Neutropenia following cycle 1 FOLFIRI/PANITUMUMAB; neutropenia following cycle 4 FOLFIRI/PANITUMUMAB.   Disposition: Hunter Walker appears unchanged.  He has been treated with salvage FOLFIRI therapy since May of this year.  The restaging CT reveals evidence  of disease progression with enlargement of liver lesions.  I discussed the CT findings and reviewed the images with Hunter Walker.  We will follow-up on the CEA from today.  We placed FOLFIRI on hold.  We discussed clinical trials versus [facility].  He will see Dr. Aleatha Borer to re-evaluate his eligibility for clinical trial at [facility].  He will return for an office visit as scheduled here on 06/08/2018.  25 minutes were spent with the patient today.  The majority of the time was used for counseling and coordination of care.  Betsy Coder, MD  05/18/2018  5:26 PM

## 2018-05-19 LAB — CEA (IN HOUSE-CHCC): CEA (CHCC-IN HOUSE): 7.52 ng/mL — AB (ref 0.00–5.00)

## 2018-05-20 ENCOUNTER — Inpatient Hospital Stay: Payer: Medicare HMO

## 2018-06-01 ENCOUNTER — Other Ambulatory Visit: Payer: Medicare HMO

## 2018-06-01 ENCOUNTER — Ambulatory Visit: Payer: Medicare HMO

## 2018-06-01 ENCOUNTER — Ambulatory Visit: Payer: Medicare HMO | Admitting: Nurse Practitioner

## 2018-06-07 ENCOUNTER — Other Ambulatory Visit: Payer: Self-pay | Admitting: Oncology

## 2018-06-08 ENCOUNTER — Ambulatory Visit: Payer: Medicare HMO | Admitting: Nurse Practitioner

## 2018-06-08 ENCOUNTER — Telehealth: Payer: Self-pay | Admitting: *Deleted

## 2018-06-08 ENCOUNTER — Other Ambulatory Visit: Payer: Medicare HMO

## 2018-06-08 ENCOUNTER — Ambulatory Visit: Payer: Medicare HMO

## 2018-06-08 NOTE — Telephone Encounter (Signed)
Per Dr Benay Spice cancel today's appts as patient needs to be seen by [facility] before we schedule him for treatment.  Patient is aware.  He reports his appt is scheduled for 06/15/2018.

## 2018-06-09 ENCOUNTER — Telehealth: Payer: Self-pay | Admitting: Nurse Practitioner

## 2018-06-09 NOTE — Telephone Encounter (Signed)
Scheduled appt per 1/7 sch message - left message for patient with appt date and time

## 2018-06-15 ENCOUNTER — Telehealth: Payer: Self-pay | Admitting: *Deleted

## 2018-06-15 ENCOUNTER — Telehealth: Payer: Self-pay | Admitting: Oncology

## 2018-06-15 NOTE — Telephone Encounter (Signed)
Scheduled appt per 11/3 sch message - pt is aware of appt date and time   

## 2018-06-15 NOTE — Telephone Encounter (Signed)
Called to cancel his appointment 06/17/18 with Dr. Benay Spice since he rescheduled his consult at [facility] to 06/24/18 at 4pm due to weather. [place]. Appointment cancelled and reschedule request sent to scheduler.

## 2018-06-17 ENCOUNTER — Ambulatory Visit: Payer: Medicare HMO | Admitting: Oncology

## 2018-06-23 DIAGNOSIS — C787 Secondary malignant neoplasm of liver and intrahepatic bile duct: Secondary | ICD-10-CM | POA: Diagnosis not present

## 2018-06-23 DIAGNOSIS — C20 Malignant neoplasm of rectum: Secondary | ICD-10-CM | POA: Diagnosis not present

## 2018-07-01 ENCOUNTER — Inpatient Hospital Stay: Payer: Medicare HMO | Attending: Nurse Practitioner | Admitting: Oncology

## 2018-07-01 VITALS — BP 132/79 | HR 67 | Temp 98.3°F | Resp 17 | Ht 73.0 in | Wt 187.6 lb

## 2018-07-01 DIAGNOSIS — C787 Secondary malignant neoplasm of liver and intrahepatic bile duct: Secondary | ICD-10-CM | POA: Insufficient documentation

## 2018-07-01 DIAGNOSIS — D701 Agranulocytosis secondary to cancer chemotherapy: Secondary | ICD-10-CM

## 2018-07-01 DIAGNOSIS — T451X5D Adverse effect of antineoplastic and immunosuppressive drugs, subsequent encounter: Secondary | ICD-10-CM | POA: Insufficient documentation

## 2018-07-01 DIAGNOSIS — Z8042 Family history of malignant neoplasm of prostate: Secondary | ICD-10-CM | POA: Insufficient documentation

## 2018-07-01 DIAGNOSIS — C20 Malignant neoplasm of rectum: Secondary | ICD-10-CM

## 2018-07-01 DIAGNOSIS — Z9221 Personal history of antineoplastic chemotherapy: Secondary | ICD-10-CM | POA: Insufficient documentation

## 2018-07-01 DIAGNOSIS — Z79899 Other long term (current) drug therapy: Secondary | ICD-10-CM | POA: Insufficient documentation

## 2018-07-01 DIAGNOSIS — Z803 Family history of malignant neoplasm of breast: Secondary | ICD-10-CM | POA: Diagnosis not present

## 2018-07-01 DIAGNOSIS — Z923 Personal history of irradiation: Secondary | ICD-10-CM | POA: Diagnosis not present

## 2018-07-01 NOTE — Progress Notes (Signed)
[place] OFFICE PROGRESS NOTE   Diagnosis: Rectal cancer  INTERVAL HISTORY:   [name] returns as scheduled.  He feels well.  No complaint.  He saw Dr. Aleatha Borer on 06/23/2018.  She reviewed treatment options with Mr. Gillie.  He has decided to enroll on a clinical trial, [id number].  He is scheduled for a return appointment at [facility] on 07/06/2018 and to begin treatment shortly thereafter.  Objective:  Vital signs in last 24 hours:  Blood pressure 132/79, pulse 67, temperature 98.3 F (36.8 C), temperature source Oral, resp. rate 17, height '6\' 1"'  (1.854 m), weight 187 lb 9.6 oz (85.1 kg), SpO2 100 %.    Resp: Lungs clear bilaterally Cardio: Regular rate and rhythm GI: No hepatosplenomegaly, no mass, nontender Vascular: No leg edema   Portacath/PICC-without erythema  Lab Results:  Lab Results  Component Value Date   WBC 9.2 05/18/2018   HGB 13.2 05/18/2018   HCT 41.4 05/18/2018   MCV 101.2 (H) 05/18/2018   PLT 233 05/18/2018   NEUTROABS 6.9 05/18/2018    CMP  Lab Results  Component Value Date   NA 142 05/18/2018   K 4.2 05/18/2018   CL 108 05/18/2018   CO2 24 05/18/2018   GLUCOSE 98 05/18/2018   BUN 15 05/18/2018   CREATININE 1.39 (H) 05/18/2018   CALCIUM 8.9 05/18/2018   PROT 7.5 05/18/2018   ALBUMIN 3.5 05/18/2018   AST 15 05/18/2018   ALT 14 05/18/2018   ALKPHOS 146 (H) 05/18/2018   BILITOT 0.3 05/18/2018   GFRNONAA 52 (L) 05/18/2018   GFRAA >60 05/18/2018    Lab Results  Component Value Date   CEA1 7.52 (H) 05/18/2018    Lab Results  Component Value Date   INR 1.09 06/26/2015    Medications: I have reviewed the patient's current medications.   Assessment/Plan: . Rectal cancer, invasive adenocarcinoma confirmed at colonoscopy 05/22/2015, EUS staging 06/01/2015 revealed a uT3,N1 lesion at 7 cm from the anal verge  Staging CT abdomen/pelvis 05/15/2015 confirmed a mass at the rectosigmoid colon, a suspicious perirectal lymph  node, and a 6 mm right liver lesion felt to most likely be a cyst  CT chest 05/30/2015 with indeterminate pulmonary nodules and multiple liver lesions suspicious for metastases  Ultrasound 06/13/2014 with no liver lesions identified  MRI abdomen 06/21/2015 consistent with multiple liver metastases  CT biopsy of liver lesion 06/26/2015. Pathology showed metastatic adenocarcinoma consistent with colonic primary.MSI-stable, low mutation burden, no RAS or BRAFmutation  Cycle 1 FOLFOX 07/10/2015  Cycle 2 FOLFOX plus Avastin 07/24/2015 with Neulasta support  Cycle 3 FOLFOX plus Avastin 08/07/2015  Cycle 4 FOLFOX plus Avastin 08/21/2015  cycle 5 FOLFOX plus Avastin 09/04/2015  Restaging CTs 09/15/2015 revealed a decrease in the size of liver lesions, decreased rectal primary, stable/decreased size of tiny lung nodules  Cycle 6 FOLFOX plus Avastin 09/18/2015  Cycle 7 FOLFOX plus Avastin 10/02/2015  Cycle 8 FOLFOX plus Avastin 10/16/2015  Cycle 9 FOLFOX plus Avastin 11/06/2015  Cycle 10 FOLFOX plus Avastin 11/20/2015  CTs 12/07/2015-mild decrease in wall thickening at the rectum, decrease in tiny hepatic metastases, stable lung nodules, no evidence of progressive metastatic disease, new left lower lobe infiltrate  Treatment changed to every three-week Avastin with every other week Xeloda beginning 12/11/2015  Restaging CTs 04/12/2016-interval increase in size of adjacent right upper lobe pulmonary nodules and right lower lobe pulmonary nodule; unchanged 5 mm lesion right hepatic lobe; no additional visualized hepatic metastasis. Grossly unchanged wall thickening  of the rectum.  Continuation of Xeloda every other week and every three-week AvastinXeloda placed on hold 05/06/2016 due to hand-foot syndrome  Xeloda resumed 05/28/2016 1500 mg every morning and 1000 mg every afternoon 7 days on/7 days off  CT 09/27/2016-enlargement of lung nodules and new liver lesions, stable rectal  mass  Cycle 1 FOLFIRI/panitumumab 10/08/2016  Cycle 2 panitumumab 10/23/2016 (FOLFIRI held secondary to neutropenia)  Cycle 3 FOLFIRI/PANITUMUMAB 11/04/2016  Cycle 4 FOLFIRI/panitumumab 11/18/2016  Cycle 5 FOLFIRI/panitumumab 12/16/2016  Cycle 6 FOLFIRI/panitumumab 12/31/2016  CTs 01/16/2017-decreased size of pulmonary nodules and liver lesions. Decreased perirectal lymph node  Cycle 7 FOLFIRI/PANITUMUMAB 01/28/2017  Cycle 8FOLFIRI/PANITUMUMAB 02/10/2017  Cycle 9 FOLFIRI/PANITUMUMAB 02/24/2017  Cycle 10 FOLFIRI/panitumumab 03/10/2017 (irinotecan held secondary to neutropenia)  Cycle 11 FOLFIRI/panitumumab 03/24/2017  Cycle12 FOLFIRI/Panitumumab 04/07/2017  Restaging CTs 04/25/2017-stable rectal mass with adjacent 8 mm left perirectal nodal metastasis. Prior hepatic metastases not visualized on the current study. Indeterminate 16 mm enhancing lesion present in segment 6, indeterminate. Minimal residual nodularity in the right upper lobe. No new/progressive pulmonary metastases.  Maintenance Xeloda/panitumumab 04/28/2017  Restaging CTs 08/18/2017-new and enlarging bilateral lung nodules; 2 hyperattenuating lesions in the liver appear similar to prior study; progression of wall thickening in the rectum with enlarging perirectal lymph nodes.  Initiation of radiation/Xeloda 09/01/2017, completed 09/22/2017  Cycle 1 FOLFIRI/Panitumumab 10/13/2017  Cycle 2 FOLFIRI 11/03/2017  Cycle 3 FOLFIRI 11/17/2017  Cycle 4 FOLFIRI 12/01/2017  Cycle 5 FOLFIRI 12/15/2017  CTs7/26/2019-mild progression lungs and liver; significantly improved rectal wall thickening; resolution of perirectal nodal adenopathy.  Cycle 6 FOLFIRI 12/29/2017  Cycle 7 FOLFIRI 01/12/2018  Cycle 8 FOLFIRI 01/26/2018  Cycle 9 FOLFIRI 02/09/2018  Cycle 10 FOLFIRI 02/23/2018  Restaging CTs 03/06/2018-majority of the pulmonary nodules are similar in size, at least 2 of the nodules are minimally increased in size.  Similar appearing lesions within the liver. Similar-appearing mild wall thickening of the rectum.  Cycle 11FOLFIRI 03/09/2018  Cycle 12 FOLFIRI 03/23/2018  Cycle 13 FOLFIRI 04/06/2018  Cycle 14 FOLFIRI 04/20/2018  Cycle 15 FOLFIRI 05/04/2018  CT 05/15/2018-no change in pulmonary nodules, mild increase in size of 2 liver lesions, stable rectal wall thickening 2.Multiple colon polyps on the colonoscopy 05/22/2015, tubular villous adenoma and a tubular adenoma were removed 3.Anemia-likely secondary to rectal bleeding.  4.Family history of multiple cancers including breast and prostate cancer 5.Transient right arm numbness 06/20/2015 6. Port-A-Cath placement 07/06/2015 by Dr. Marcello Moores 7. Mild neutropenia secondary to chemotherapy following cycle 1 FOLFOX. Neulasta added with cycle 2. 8. Delayed nausea following chemotherapy-emend added with cycle 3 FOLFOX;Emend added with cycle 12 FOLFIRI. 9. Early oxaliplatin neuropathy 10. 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 Xeloda resumed at a reduced dose. 11. Bilateral hand weakness. Question related to previous oxaliplatin. Referred to neurology 06/17/2016. 12. Hypertension-amlodipine started 07/08/2016 13. Rash secondary to PANITUMUMAB.  14. Neutropenia following cycle 1 FOLFIRI/PANITUMUMAB; neutropenia following cycle 4 FOLFIRI/PANITUMUMAB.     Disposition: [name] appears well.  He plans to enroll on a clinical trial with palbociclib and ulixertinib at [facility] beginning within the next few weeks.  He will have close clinical follow-up at [facility].  He will be sure the Port-A-Cath is being flushed.  Mr. Theil will return for an office visit here during the week of 08/10/2018.  I am available to see him sooner as needed.  Betsy Coder, MD  07/01/2018  11:11 AM

## 2018-07-03 ENCOUNTER — Telehealth: Payer: Self-pay | Admitting: Nurse Practitioner

## 2018-07-03 NOTE — Telephone Encounter (Signed)
Called patient per 1/29 los - left message with appt date and time

## 2018-07-06 DIAGNOSIS — M799 Soft tissue disorder, unspecified: Secondary | ICD-10-CM | POA: Diagnosis not present

## 2018-07-06 DIAGNOSIS — C787 Secondary malignant neoplasm of liver and intrahepatic bile duct: Secondary | ICD-10-CM | POA: Diagnosis not present

## 2018-07-06 DIAGNOSIS — Z006 Encounter for examination for normal comparison and control in clinical research program: Secondary | ICD-10-CM | POA: Diagnosis not present

## 2018-07-06 DIAGNOSIS — C20 Malignant neoplasm of rectum: Secondary | ICD-10-CM | POA: Diagnosis not present

## 2018-07-06 DIAGNOSIS — M7989 Other specified soft tissue disorders: Secondary | ICD-10-CM | POA: Diagnosis not present

## 2018-07-06 DIAGNOSIS — R918 Other nonspecific abnormal finding of lung field: Secondary | ICD-10-CM | POA: Diagnosis not present

## 2018-07-15 DIAGNOSIS — C20 Malignant neoplasm of rectum: Secondary | ICD-10-CM | POA: Diagnosis not present

## 2018-07-15 DIAGNOSIS — Z9221 Personal history of antineoplastic chemotherapy: Secondary | ICD-10-CM | POA: Diagnosis not present

## 2018-07-15 DIAGNOSIS — C787 Secondary malignant neoplasm of liver and intrahepatic bile duct: Secondary | ICD-10-CM | POA: Diagnosis not present

## 2018-07-15 DIAGNOSIS — Z006 Encounter for examination for normal comparison and control in clinical research program: Secondary | ICD-10-CM | POA: Diagnosis not present

## 2018-07-15 DIAGNOSIS — C7800 Secondary malignant neoplasm of unspecified lung: Secondary | ICD-10-CM | POA: Diagnosis not present

## 2018-07-22 DIAGNOSIS — R51 Headache: Secondary | ICD-10-CM | POA: Diagnosis not present

## 2018-07-22 DIAGNOSIS — C7800 Secondary malignant neoplasm of unspecified lung: Secondary | ICD-10-CM | POA: Diagnosis not present

## 2018-07-22 DIAGNOSIS — C787 Secondary malignant neoplasm of liver and intrahepatic bile duct: Secondary | ICD-10-CM | POA: Diagnosis not present

## 2018-07-22 DIAGNOSIS — Z923 Personal history of irradiation: Secondary | ICD-10-CM | POA: Diagnosis not present

## 2018-07-22 DIAGNOSIS — Z006 Encounter for examination for normal comparison and control in clinical research program: Secondary | ICD-10-CM | POA: Diagnosis not present

## 2018-07-22 DIAGNOSIS — Z87891 Personal history of nicotine dependence: Secondary | ICD-10-CM | POA: Diagnosis not present

## 2018-07-22 DIAGNOSIS — Z9221 Personal history of antineoplastic chemotherapy: Secondary | ICD-10-CM | POA: Diagnosis not present

## 2018-07-22 DIAGNOSIS — C20 Malignant neoplasm of rectum: Secondary | ICD-10-CM | POA: Diagnosis not present

## 2018-07-22 DIAGNOSIS — R140 Abdominal distension (gaseous): Secondary | ICD-10-CM | POA: Diagnosis not present

## 2018-07-22 DIAGNOSIS — C259 Malignant neoplasm of pancreas, unspecified: Secondary | ICD-10-CM | POA: Diagnosis not present

## 2018-07-22 DIAGNOSIS — G629 Polyneuropathy, unspecified: Secondary | ICD-10-CM | POA: Diagnosis not present

## 2018-07-22 DIAGNOSIS — I10 Essential (primary) hypertension: Secondary | ICD-10-CM | POA: Diagnosis not present

## 2018-07-29 DIAGNOSIS — R7989 Other specified abnormal findings of blood chemistry: Secondary | ICD-10-CM | POA: Diagnosis not present

## 2018-07-29 DIAGNOSIS — C20 Malignant neoplasm of rectum: Secondary | ICD-10-CM | POA: Diagnosis not present

## 2018-07-29 DIAGNOSIS — C7800 Secondary malignant neoplasm of unspecified lung: Secondary | ICD-10-CM | POA: Diagnosis not present

## 2018-07-29 DIAGNOSIS — Z006 Encounter for examination for normal comparison and control in clinical research program: Secondary | ICD-10-CM | POA: Diagnosis not present

## 2018-07-29 DIAGNOSIS — Z9221 Personal history of antineoplastic chemotherapy: Secondary | ICD-10-CM | POA: Diagnosis not present

## 2018-07-29 DIAGNOSIS — K5900 Constipation, unspecified: Secondary | ICD-10-CM | POA: Diagnosis not present

## 2018-07-29 DIAGNOSIS — C787 Secondary malignant neoplasm of liver and intrahepatic bile duct: Secondary | ICD-10-CM | POA: Diagnosis not present

## 2018-08-05 DIAGNOSIS — C787 Secondary malignant neoplasm of liver and intrahepatic bile duct: Secondary | ICD-10-CM | POA: Diagnosis not present

## 2018-08-05 DIAGNOSIS — C20 Malignant neoplasm of rectum: Secondary | ICD-10-CM | POA: Diagnosis not present

## 2018-08-05 DIAGNOSIS — Z9221 Personal history of antineoplastic chemotherapy: Secondary | ICD-10-CM | POA: Diagnosis not present

## 2018-08-05 DIAGNOSIS — Z8507 Personal history of malignant neoplasm of pancreas: Secondary | ICD-10-CM | POA: Diagnosis not present

## 2018-08-05 DIAGNOSIS — Z006 Encounter for examination for normal comparison and control in clinical research program: Secondary | ICD-10-CM | POA: Diagnosis not present

## 2018-08-05 DIAGNOSIS — Z87891 Personal history of nicotine dependence: Secondary | ICD-10-CM | POA: Diagnosis not present

## 2018-08-05 DIAGNOSIS — R21 Rash and other nonspecific skin eruption: Secondary | ICD-10-CM | POA: Diagnosis not present

## 2018-08-05 DIAGNOSIS — K649 Unspecified hemorrhoids: Secondary | ICD-10-CM | POA: Diagnosis not present

## 2018-08-05 DIAGNOSIS — K5900 Constipation, unspecified: Secondary | ICD-10-CM | POA: Diagnosis not present

## 2018-08-05 DIAGNOSIS — L089 Local infection of the skin and subcutaneous tissue, unspecified: Secondary | ICD-10-CM | POA: Diagnosis not present

## 2018-08-05 DIAGNOSIS — C7800 Secondary malignant neoplasm of unspecified lung: Secondary | ICD-10-CM | POA: Diagnosis not present

## 2018-08-05 DIAGNOSIS — T50995A Adverse effect of other drugs, medicaments and biological substances, initial encounter: Secondary | ICD-10-CM | POA: Diagnosis not present

## 2018-08-05 DIAGNOSIS — K625 Hemorrhage of anus and rectum: Secondary | ICD-10-CM | POA: Diagnosis not present

## 2018-08-10 ENCOUNTER — Ambulatory Visit: Payer: Medicare HMO | Admitting: Nurse Practitioner

## 2018-08-17 ENCOUNTER — Telehealth: Payer: Self-pay | Admitting: *Deleted

## 2018-08-17 NOTE — Telephone Encounter (Signed)
Patient called requesting to reschedule his appointment with Dr. Benay Spice. He is still on trial at [facility], but wants to see Dr. Benay Spice. Scheduled appointment.

## 2018-08-27 ENCOUNTER — Telehealth: Payer: Self-pay | Admitting: *Deleted

## 2018-08-27 ENCOUNTER — Other Ambulatory Visit: Payer: Self-pay

## 2018-08-27 ENCOUNTER — Inpatient Hospital Stay: Payer: Medicare HMO | Attending: Nurse Practitioner | Admitting: Oncology

## 2018-08-27 ENCOUNTER — Inpatient Hospital Stay: Payer: Medicare HMO

## 2018-08-27 DIAGNOSIS — R300 Dysuria: Secondary | ICD-10-CM | POA: Diagnosis not present

## 2018-08-27 DIAGNOSIS — Z803 Family history of malignant neoplasm of breast: Secondary | ICD-10-CM | POA: Insufficient documentation

## 2018-08-27 DIAGNOSIS — K5903 Drug induced constipation: Secondary | ICD-10-CM | POA: Diagnosis not present

## 2018-08-27 DIAGNOSIS — Z8042 Family history of malignant neoplasm of prostate: Secondary | ICD-10-CM | POA: Diagnosis not present

## 2018-08-27 DIAGNOSIS — R21 Rash and other nonspecific skin eruption: Secondary | ICD-10-CM | POA: Insufficient documentation

## 2018-08-27 DIAGNOSIS — I10 Essential (primary) hypertension: Secondary | ICD-10-CM | POA: Diagnosis not present

## 2018-08-27 DIAGNOSIS — C20 Malignant neoplasm of rectum: Secondary | ICD-10-CM | POA: Insufficient documentation

## 2018-08-27 DIAGNOSIS — R6881 Early satiety: Secondary | ICD-10-CM | POA: Diagnosis not present

## 2018-08-27 DIAGNOSIS — G893 Neoplasm related pain (acute) (chronic): Secondary | ICD-10-CM | POA: Diagnosis not present

## 2018-08-27 DIAGNOSIS — R350 Frequency of micturition: Secondary | ICD-10-CM | POA: Diagnosis not present

## 2018-08-27 DIAGNOSIS — C787 Secondary malignant neoplasm of liver and intrahepatic bile duct: Secondary | ICD-10-CM | POA: Diagnosis not present

## 2018-08-27 DIAGNOSIS — K5900 Constipation, unspecified: Secondary | ICD-10-CM

## 2018-08-27 DIAGNOSIS — Z9221 Personal history of antineoplastic chemotherapy: Secondary | ICD-10-CM

## 2018-08-27 DIAGNOSIS — R197 Diarrhea, unspecified: Secondary | ICD-10-CM | POA: Diagnosis not present

## 2018-08-27 DIAGNOSIS — K6289 Other specified diseases of anus and rectum: Secondary | ICD-10-CM | POA: Diagnosis not present

## 2018-08-27 LAB — URINALYSIS, COMPLETE (UACMP) WITH MICROSCOPIC
Bilirubin Urine: NEGATIVE
Glucose, UA: NEGATIVE mg/dL
Ketones, ur: NEGATIVE mg/dL
Leukocytes,Ua: NEGATIVE
Nitrite: NEGATIVE
PROTEIN: NEGATIVE mg/dL
Specific Gravity, Urine: 1.011 (ref 1.005–1.030)
pH: 6 (ref 5.0–8.0)

## 2018-08-27 NOTE — Telephone Encounter (Signed)
Call from Dr. Aleatha Borer to notify Dr. Benay Spice that patient stopped coming to his study appointments as of last visit there 08/05/18. He should be out of study drug (was due refill on 08/12/18). He is now "off study" due to not keeping his study appointments. He has no further visits scheduled w/[facility]. Notified MD>

## 2018-08-27 NOTE — Telephone Encounter (Signed)
Notified patient that U/A is negative thus far. Will follow up on the culture tomorrow and call if anything shows up. Informed him of call from Dr. Aleatha Borer today and he does know now that he is off study and has no F/U there. Dr. Benay Spice would like to see him in about 3 weeks. Will discuss further tomorrow.

## 2018-08-27 NOTE — Progress Notes (Signed)
[place] OFFICE PROGRESS NOTE   This is a telephone visit with Hunter Walker.  I contacted Hunter Walker at his home number from my office.  He understands the visit is in place of the scheduled office visit today. Diagnosis: Rectal cancer  INTERVAL HISTORY:   Hunter Walker is currently enrolled on a clinical trial at [facility] with palbociclib and ulixertinib.  He started treatment in February.  He has developed a rash.  He has been followed closely at [facility], but was unable to go to his appointment 2 weeks ago secondary to nausea and abdominal pain.  He discontinued study drug on 08/14/2018.  He relates constipation to the study drug.  He had an episode of rectal bleeding.  He complains of dysuria today.  He reports burning with urination and urinary frequency.  Hunter Walker reports early satiety, but has a good appetite in the evening.  He had diarrhea after taking Senokot.  He talked to [facility] today and reports MiraLAX and a stool softener are recommended.  He is scheduled for follow-up at [facility] next week.    Objective:  Physical examination-not performed today  Lab Results:  Lab Results  Component Value Date   WBC 9.2 05/18/2018   HGB 13.2 05/18/2018   HCT 41.4 05/18/2018   MCV 101.2 (H) 05/18/2018   PLT 233 05/18/2018   NEUTROABS 6.9 05/18/2018    CMP  Lab Results  Component Value Date   NA 142 05/18/2018   K 4.2 05/18/2018   CL 108 05/18/2018   CO2 24 05/18/2018   GLUCOSE 98 05/18/2018   BUN 15 05/18/2018   CREATININE 1.39 (H) 05/18/2018   CALCIUM 8.9 05/18/2018   PROT 7.5 05/18/2018   ALBUMIN 3.5 05/18/2018   AST 15 05/18/2018   ALT 14 05/18/2018   ALKPHOS 146 (H) 05/18/2018   BILITOT 0.3 05/18/2018   GFRNONAA 52 (L) 05/18/2018   GFRAA >60 05/18/2018    Medications: I have reviewed the patient's current medications.   Assessment/Plan: 1.Rectal cancer, invasive adenocarcinoma confirmed at colonoscopy 05/22/2015, EUS staging 06/01/2015 revealed a  uT3,N1 lesion at 7 cm from the anal verge  Staging CT abdomen/pelvis 05/15/2015 confirmed a mass at the rectosigmoid colon, a suspicious perirectal lymph node, and a 6 mm right liver lesion felt to most likely be a cyst  CT chest 05/30/2015 with indeterminate pulmonary nodules and multiple liver lesions suspicious for metastases  Ultrasound 06/13/2014 with no liver lesions identified  MRI abdomen 06/21/2015 consistent with multiple liver metastases  CT biopsy of liver lesion 06/26/2015. Pathology showed metastatic adenocarcinoma consistent with colonic primary.MSI-stable, low mutation burden, no RAS or BRAFmutation  Cycle 1 FOLFOX 07/10/2015  Cycle 2 FOLFOX plus Avastin 07/24/2015 with Neulasta support  Cycle 3 FOLFOX plus Avastin 08/07/2015  Cycle 4 FOLFOX plus Avastin 08/21/2015  cycle 5 FOLFOX plus Avastin 09/04/2015  Restaging CTs 09/15/2015 revealed a decrease in the size of liver lesions, decreased rectal primary, stable/decreased size of tiny lung nodules  Cycle 6 FOLFOX plus Avastin 09/18/2015  Cycle 7 FOLFOX plus Avastin 10/02/2015  Cycle 8 FOLFOX plus Avastin 10/16/2015  Cycle 9 FOLFOX plus Avastin 11/06/2015  Cycle 10 FOLFOX plus Avastin 11/20/2015  CTs 12/07/2015-mild decrease in wall thickening at the rectum, decrease in tiny hepatic metastases, stable lung nodules, no evidence of progressive metastatic disease, new left lower lobe infiltrate  Treatment changed to every three-week Avastin with every other week Xeloda beginning 12/11/2015  Restaging CTs 04/12/2016-interval increase in size of adjacent right upper  lobe pulmonary nodules and right lower lobe pulmonary nodule; unchanged 5 mm lesion right hepatic lobe; no additional visualized hepatic metastasis. Grossly unchanged wall thickening of the rectum.  Continuation of Xeloda every other week and every three-week AvastinXeloda placed on hold 05/06/2016 due to hand-foot syndrome  Xeloda resumed 05/28/2016  1500 mg every morning and 1000 mg every afternoon 7 days on/7 days off  CT 09/27/2016-enlargement of lung nodules and new liver lesions, stable rectal mass  Cycle 1 FOLFIRI/panitumumab 10/08/2016  Cycle 2 panitumumab 10/23/2016 (FOLFIRI held secondary to neutropenia)  Cycle 3 FOLFIRI/PANITUMUMAB 11/04/2016  Cycle 4 FOLFIRI/panitumumab 11/18/2016  Cycle 5 FOLFIRI/panitumumab 12/16/2016  Cycle 6 FOLFIRI/panitumumab 12/31/2016  CTs 01/16/2017-decreased size of pulmonary nodules and liver lesions. Decreased perirectal lymph node  Cycle 7 FOLFIRI/PANITUMUMAB 01/28/2017  Cycle 8FOLFIRI/PANITUMUMAB 02/10/2017  Cycle 9 FOLFIRI/PANITUMUMAB 02/24/2017  Cycle 10 FOLFIRI/panitumumab 03/10/2017 (irinotecan held secondary to neutropenia)  Cycle 11 FOLFIRI/panitumumab 03/24/2017  Cycle12 FOLFIRI/Panitumumab 04/07/2017  Restaging CTs 04/25/2017-stable rectal mass with adjacent 8 mm left perirectal nodal metastasis. Prior hepatic metastases not visualized on the current study. Indeterminate 16 mm enhancing lesion present in segment 6, indeterminate. Minimal residual nodularity in the right upper lobe. No new/progressive pulmonary metastases.  Maintenance Xeloda/panitumumab 04/28/2017  Restaging CTs 08/18/2017-new and enlarging bilateral lung nodules; 2 hyperattenuating lesions in the liver appear similar to prior study; progression of wall thickening in the rectum with enlarging perirectal lymph nodes.  Initiation of radiation/Xeloda 09/01/2017, completed 09/22/2017  Cycle 1 FOLFIRI/Panitumumab 10/13/2017  Cycle 2 FOLFIRI 11/03/2017  Cycle 3 FOLFIRI 11/17/2017  Cycle 4 FOLFIRI 12/01/2017  Cycle 5 FOLFIRI 12/15/2017  CTs7/26/2019-mild progression lungs and liver; significantly improved rectal wall thickening; resolution of perirectal nodal adenopathy.  Cycle 6 FOLFIRI 12/29/2017  Cycle 7 FOLFIRI 01/12/2018  Cycle 8 FOLFIRI 01/26/2018  Cycle 9 FOLFIRI 02/09/2018  Cycle 10 FOLFIRI  02/23/2018  Restaging CTs 03/06/2018-majority of the pulmonary nodules are similar in size, at least 2 of the nodules are minimally increased in size. Similar appearing lesions within the liver. Similar-appearing mild wall thickening of the rectum.  Cycle 11FOLFIRI 03/09/2018  Cycle 12 FOLFIRI 03/23/2018  Cycle 13 FOLFIRI 04/06/2018  Cycle 14 FOLFIRI 04/20/2018  Cycle 15 FOLFIRI 05/04/2018  CT 05/15/2018-no change in pulmonary nodules, mild increase in size of 2 liver lesions, stable rectal wall thickening  On study at [facility] with palbociclib and Ulixertinib beginning 07/15/2018 2.Multiple colon polyps on the colonoscopy 05/22/2015, tubular villous adenoma and a tubular adenoma were removed 3.Anemia-likely secondary to rectal bleeding.  4.Family history of multiple cancers including breast and prostate cancer 5.Transient right arm numbness 06/20/2015 6. Port-A-Cath placement 07/06/2015 by Dr. Marcello Moores 7. Mild neutropenia secondary to chemotherapy following cycle 1 FOLFOX. Neulasta added with cycle 2. 8. Delayed nausea following chemotherapy-emend added with cycle 3 FOLFOX;Emend added with cycle 12 FOLFIRI. 9. Early oxaliplatin neuropathy 10. 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 Xeloda resumed at a reduced dose. 11. Bilateral hand weakness. Question related to previous oxaliplatin. Referred to neurology 06/17/2016. 12. Hypertension-amlodipine started 07/08/2016 13. Rash secondary to PANITUMUMAB.  14. Neutropenia following cycle 1 FOLFIRI/PANITUMUMAB; neutropenia following cycle 4 FOLFIRI/PANITUMUMAB.      Disposition: Hunter Walker is currently enrolled on a clinical trial at [facility].  He reports a skin rash, constipation, and rectal pain.  I suspect the rash is related to the study drug.  The constipation and rectal pain may be related to local progression of the rectal tumor.  He experienced similar pain in the past which improved following  palliative radiation.  He will continue the MiraLAX and stool softener.  Hunter Walker complains of dysuria.  This could be related to drug toxicity, a urinary tract infection, or tumor involving the urinary tract.  We will check a urinalysis and culture today.  Hunter Walker will return for an office visit on 09/28/2018.  He will follow-up at [facility] in the interim.  Hunter Walker will contact us for new symptoms.  I contacted Hunter Walker by telephone from my office at approximately 11:35 AM today.  25 minutes were spent speaking with Hunter Walker today with a total of greater than 30 minutes spent with documentation and review of laboratory studies. Betsy Coder, MD  08/27/2018  10:03 AM

## 2018-08-27 NOTE — Telephone Encounter (Signed)
-----   Message from Ladell Pier, MD sent at 08/27/2018  4:28 PM EDT ----- Please call patient, urinalysis is negative, we will follow-up culture

## 2018-08-28 ENCOUNTER — Telehealth: Payer: Self-pay | Admitting: *Deleted

## 2018-08-28 LAB — URINE CULTURE: Culture: NO GROWTH

## 2018-08-28 NOTE — Telephone Encounter (Signed)
Left voice mail with negative urine culture and to call for persistent/worsening dysuria. Will have him seen in next 1-2 weeks. Scheduling message sent.

## 2018-08-28 NOTE — Telephone Encounter (Signed)
-----   Message from Ladell Pier, MD sent at 08/28/2018  3:59 PM EDT ----- Please call patient, urine culture is negative, call for persistent dysuria, needs to schedule a follow-up appointment next 1-2 weeks with Benay Spice or Lattie Haw

## 2018-08-31 ENCOUNTER — Telehealth: Payer: Self-pay | Admitting: Nurse Practitioner

## 2018-08-31 NOTE — Telephone Encounter (Signed)
Scheduled appt per 3/26 sch message - pt aware of appt date and time

## 2018-09-28 ENCOUNTER — Encounter: Payer: Self-pay | Admitting: Nurse Practitioner

## 2018-09-28 ENCOUNTER — Telehealth: Payer: Self-pay | Admitting: Nurse Practitioner

## 2018-09-28 ENCOUNTER — Other Ambulatory Visit: Payer: Self-pay

## 2018-09-28 ENCOUNTER — Inpatient Hospital Stay: Payer: Medicare HMO | Attending: Nurse Practitioner | Admitting: Nurse Practitioner

## 2018-09-28 VITALS — BP 152/91 | HR 65 | Temp 98.0°F | Resp 18 | Ht 73.0 in | Wt 187.4 lb

## 2018-09-28 DIAGNOSIS — C20 Malignant neoplasm of rectum: Secondary | ICD-10-CM

## 2018-09-28 DIAGNOSIS — D701 Agranulocytosis secondary to cancer chemotherapy: Secondary | ICD-10-CM | POA: Diagnosis not present

## 2018-09-28 DIAGNOSIS — T451X5D Adverse effect of antineoplastic and immunosuppressive drugs, subsequent encounter: Secondary | ICD-10-CM | POA: Insufficient documentation

## 2018-09-28 DIAGNOSIS — C787 Secondary malignant neoplasm of liver and intrahepatic bile duct: Secondary | ICD-10-CM | POA: Diagnosis not present

## 2018-09-28 DIAGNOSIS — Z9221 Personal history of antineoplastic chemotherapy: Secondary | ICD-10-CM | POA: Diagnosis not present

## 2018-09-28 DIAGNOSIS — G620 Drug-induced polyneuropathy: Secondary | ICD-10-CM

## 2018-09-28 NOTE — Progress Notes (Addendum)
[place] OFFICE PROGRESS NOTE   Diagnosis: Rectal cancer  INTERVAL HISTORY:   Hunter Walker returns for follow-up.  He reports that overall he feels "great".  Main complaint is hemorrhoids "acting up".  He denies any bleeding but notes the hemorrhoids look red.  Stools are described as "syrupy".  He has gas discomfort.  He is having frequent bowel movements.  He has pain at the low back.  No nausea or vomiting.  He has a good appetite.  No fever, cough, shortness of breath.  He denies numbness/tingling in the hands and feet.  Objective:  Vital signs in last 24 hours:  Blood pressure (!) 152/91, pulse 65, temperature 98 F (36.7 C), temperature source Oral, resp. rate 18, height _0  (1.854 m), weight 187 lb 6.4 oz (85 kg), SpO2 100 %.    HEENT: No thrush or ulcers. Lymphatics: No palpable cervical, supraclavicular, axillary or inguinal lymph nodes. GI: No hepatomegaly.  Small, soft external hemorrhoids. Vascular: No leg edema. Port-A-Cath without erythema.  Lab Results:  Lab Results  Component Value Date   WBC 9.2 05/18/2018   HGB 13.2 05/18/2018   HCT 41.4 05/18/2018   MCV 101.2 (H) 05/18/2018   PLT 233 05/18/2018   NEUTROABS 6.9 05/18/2018    Imaging:  No results found.  Medications: I have reviewed the patient's current medications.  Assessment/Plan: 1.Rectal cancer, invasive adenocarcinoma confirmed at colonoscopy 05/22/2015, EUS staging 06/01/2015 revealed a uT3,N1 lesion at 7 cm from the anal verge  Staging CT abdomen/pelvis 05/15/2015 confirmed a mass at the rectosigmoid colon, a suspicious perirectal lymph node, and a 6 mm right liver lesion felt to most likely be a cyst  CT chest 05/30/2015 with indeterminate pulmonary nodules and multiple liver lesions suspicious for metastases  Ultrasound 06/13/2014 with no liver lesions identified  MRI abdomen 06/21/2015 consistent with multiple liver metastases  CT biopsy of liver lesion 06/26/2015.  Pathology showed metastatic adenocarcinoma consistent with colonic primary.MSI-stable, low mutation burden, no RAS or BRAFmutation  Cycle 1 FOLFOX 07/10/2015  Cycle 2 FOLFOX plus Avastin 07/24/2015 with Neulasta support  Cycle 3 FOLFOX plus Avastin 08/07/2015  Cycle 4 FOLFOX plus Avastin 08/21/2015  cycle 5 FOLFOX plus Avastin 09/04/2015  Restaging CTs 09/15/2015 revealed a decrease in the size of liver lesions, decreased rectal primary, stable/decreased size of tiny lung nodules  Cycle 6 FOLFOX plus Avastin 09/18/2015  Cycle 7 FOLFOX plus Avastin 10/02/2015  Cycle 8 FOLFOX plus Avastin 10/16/2015  Cycle 9 FOLFOX plus Avastin 11/06/2015  Cycle 10 FOLFOX plus Avastin 11/20/2015  CTs 12/07/2015-mild decrease in wall thickening at the rectum, decrease in tiny hepatic metastases, stable lung nodules, no evidence of progressive metastatic disease, new left lower lobe infiltrate  Treatment changed to every three-week Avastin with every other week Xeloda beginning 12/11/2015  Restaging CTs 04/12/2016-interval increase in size of adjacent right upper lobe pulmonary nodules and right lower lobe pulmonary nodule; unchanged 5 mm lesion right hepatic lobe; no additional visualized hepatic metastasis. Grossly unchanged wall thickening of the rectum.  Continuation of Xeloda every other week and every three-week AvastinXeloda placed on hold 05/06/2016 due to hand-foot syndrome  Xeloda resumed 05/28/2016 1500 mg every morning and 1000 mg every afternoon 7 days on/7 days off  CT 09/27/2016-enlargement of lung nodules and new liver lesions, stable rectal mass  Cycle 1 FOLFIRI/panitumumab 10/08/2016  Cycle 2 panitumumab 10/23/2016 (FOLFIRI held secondary to neutropenia)  Cycle 3 FOLFIRI/PANITUMUMAB 11/04/2016  Cycle 4 FOLFIRI/panitumumab 11/18/2016  Cycle 5 FOLFIRI/panitumumab 12/16/2016  Cycle 6 FOLFIRI/panitumumab 12/31/2016  CTs 01/16/2017-decreased size of pulmonary nodules and  liver lesions. Decreased perirectal lymph node  Cycle 7 FOLFIRI/PANITUMUMAB 01/28/2017  Cycle 8FOLFIRI/PANITUMUMAB 02/10/2017  Cycle 9 FOLFIRI/PANITUMUMAB 02/24/2017  Cycle 10 FOLFIRI/panitumumab 03/10/2017 (irinotecan held secondary to neutropenia)  Cycle 11 FOLFIRI/panitumumab 03/24/2017  Cycle12 FOLFIRI/Panitumumab 04/07/2017  Restaging CTs 04/25/2017-stable rectal mass with adjacent 8 mm left perirectal nodal metastasis. Prior hepatic metastases not visualized on the current study. Indeterminate 16 mm enhancing lesion present in segment 6, indeterminate. Minimal residual nodularity in the right upper lobe. No new/progressive pulmonary metastases.  Maintenance Xeloda/panitumumab 04/28/2017  Restaging CTs 08/18/2017-new and enlarging bilateral lung nodules; 2 hyperattenuating lesions in the liver appear similar to prior study; progression of wall thickening in the rectum with enlarging perirectal lymph nodes.  Initiation of radiation/Xeloda 09/01/2017, completed 09/22/2017  Cycle 1 FOLFIRI/Panitumumab 10/13/2017  Cycle 2 FOLFIRI 11/03/2017  Cycle 3 FOLFIRI 11/17/2017  Cycle 4 FOLFIRI 12/01/2017  Cycle 5 FOLFIRI 12/15/2017  CTs7/26/2019-mild progression lungs and liver; significantly improved rectal wall thickening; resolution of perirectal nodal adenopathy.  Cycle 6 FOLFIRI 12/29/2017  Cycle 7 FOLFIRI 01/12/2018  Cycle 8 FOLFIRI 01/26/2018  Cycle 9 FOLFIRI 02/09/2018  Cycle 10 FOLFIRI 02/23/2018  Restaging CTs 03/06/2018-majority of the pulmonary nodules are similar in size, at least 2 of the nodules are minimally increased in size. Similar appearing lesions within the liver. Similar-appearing mild wall thickening of the rectum.  Cycle 11FOLFIRI 03/09/2018  Cycle 12 FOLFIRI 03/23/2018  Cycle 13 FOLFIRI 04/06/2018  Cycle 14 FOLFIRI 04/20/2018  Cycle 15 FOLFIRI 05/04/2018  CT 05/15/2018-no change in pulmonary nodules, mild increase in size of 2 liver lesions, stable  rectal wall thickening  On study at [facility], [facility] with palbociclib and Ulixertinib beginning 07/15/2018  No longer on study at [facility] 09/28/2018 2.Multiple colon polyps on the colonoscopy 05/22/2015, tubular villous adenoma and a tubular adenoma were removed 3.Anemia-likely secondary to rectal bleeding.  4.Family history of multiple cancers including breast and prostate cancer 5.Transient right arm numbness 06/20/2015 6. Port-A-Cath placement 07/06/2015 by Dr. Marcello Moores 7. Mild neutropenia secondary to chemotherapy following cycle 1 FOLFOX. Neulasta added with cycle 2. 8. Delayed nausea following chemotherapy-emend added with cycle 3 FOLFOX;Emend added with cycle 12 FOLFIRI. 9. Early oxaliplatin neuropathy 10. 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 Xeloda resumed at a reduced dose. 11. Bilateral hand weakness. Question related to previous oxaliplatin. Referred to neurology 06/17/2016. 12. Hypertension-amlodipine started 07/08/2016 13. Rash secondary to PANITUMUMAB.  14. Neutropenia following cycle 1 FOLFIRI/PANITUMUMAB; neutropenia following cycle 4 FOLFIRI/PANITUMUMAB.  Disposition: Hunter Walker appears stable.  He is no longer on the study at [facility].  We discussed systemic treatment options.  Dr. Benay Spice recommends retreatment with FOLFOX.  We reviewed potential toxicities associated with this regimen.  He agrees to proceed.  He will undergo restaging CTs in approximately 1 week.  He will return for follow-up and cycle 1 FOLFOX in 2 weeks.  He will contact the office in the interim with any problems.  Patient seen with Dr. Benay Spice.  25 minutes were spent face-to-face at today's visit with the majority of that time involved in counseling/coordination of care.    Ned Card ANP/GNP-BC   09/28/2018  9:59 AM  This was a shared visit with Ned Card.  Mr. Maiden was unable to tolerate the protocol treatment at [facility].  He has increased rectal symptoms, likely  secondary to tumor progression.  We discussed treatment options.  The plan is to recycle FOLFOX chemotherapy.  He understands the potential  of an allergic reaction and progressive neuropathy.  He will undergo restaging CTs and return for an office visit of FOLFOX in 2 weeks.  Julieanne Manson, MD

## 2018-09-28 NOTE — Progress Notes (Signed)
DISCONTINUE OFF PATHWAY REGIMEN - Colorectal   [id number]:FOLFIRI + Panitumumab q14d (**2 cycles per order sheet**):   A cycle is every 14 days:     Panitumumab      Irinotecan      Leucovorin      5-Fluorouracil      5-Fluorouracil   **Always confirm dose/schedule in your pharmacy ordering system**  REASON: Disease Progression PRIOR TREATMENT: Off Pathway: FOLFIRI + Panitumumab q14d (**2 cycles per order sheet**) TREATMENT RESPONSE: Unable to Evaluate  START OFF PATHWAY REGIMEN - Colorectal   [id number]:FOLFOX (q14d):   A cycle is every 14 days:     Oxaliplatin      Leucovorin      5-Fluorouracil      5-Fluorouracil   **Always confirm dose/schedule in your pharmacy ordering system**  Patient Characteristics: Distant Metastases, Third Line, KRAS/NRAS Wild-Type, BRAF Wild-Type/Unknown, Prior Anti-EGFR Therapy, MSS/pMMR Therapeutic Status: Distant Metastases BRAF Mutation Status: Wild-Type (no mutation) KRAS/NRAS Mutation Status: Wild-Type (no mutation) Line of Therapy: Third Engineer, civil (consulting) Status: MSS/pMMR Intent of Therapy: Non-Curative / Palliative Intent, Discussed with Patient

## 2018-09-28 NOTE — Telephone Encounter (Signed)
Scheduled appt per 4/27 los. °

## 2018-10-07 ENCOUNTER — Inpatient Hospital Stay: Payer: Medicare HMO

## 2018-10-07 ENCOUNTER — Other Ambulatory Visit: Payer: Self-pay

## 2018-10-07 ENCOUNTER — Inpatient Hospital Stay: Payer: Medicare HMO | Attending: Nurse Practitioner

## 2018-10-07 ENCOUNTER — Ambulatory Visit (HOSPITAL_COMMUNITY)
Admission: RE | Admit: 2018-10-07 | Discharge: 2018-10-07 | Disposition: A | Discharge status: Home / Self Care | Payer: Medicare HMO | Source: Ambulatory Visit | Attending: Nurse Practitioner | Admitting: Nurse Practitioner

## 2018-10-07 DIAGNOSIS — C20 Malignant neoplasm of rectum: Secondary | ICD-10-CM

## 2018-10-07 DIAGNOSIS — C7800 Secondary malignant neoplasm of unspecified lung: Secondary | ICD-10-CM | POA: Insufficient documentation

## 2018-10-07 DIAGNOSIS — D701 Agranulocytosis secondary to cancer chemotherapy: Secondary | ICD-10-CM | POA: Diagnosis not present

## 2018-10-07 DIAGNOSIS — Z5189 Encounter for other specified aftercare: Secondary | ICD-10-CM | POA: Insufficient documentation

## 2018-10-07 DIAGNOSIS — Z803 Family history of malignant neoplasm of breast: Secondary | ICD-10-CM | POA: Diagnosis not present

## 2018-10-07 DIAGNOSIS — I10 Essential (primary) hypertension: Secondary | ICD-10-CM | POA: Insufficient documentation

## 2018-10-07 DIAGNOSIS — C787 Secondary malignant neoplasm of liver and intrahepatic bile duct: Secondary | ICD-10-CM | POA: Insufficient documentation

## 2018-10-07 DIAGNOSIS — G620 Drug-induced polyneuropathy: Secondary | ICD-10-CM | POA: Diagnosis not present

## 2018-10-07 DIAGNOSIS — Z8042 Family history of malignant neoplasm of prostate: Secondary | ICD-10-CM | POA: Diagnosis not present

## 2018-10-07 DIAGNOSIS — Z5111 Encounter for antineoplastic chemotherapy: Secondary | ICD-10-CM | POA: Insufficient documentation

## 2018-10-07 DIAGNOSIS — Z95828 Presence of other vascular implants and grafts: Secondary | ICD-10-CM

## 2018-10-07 LAB — CMP (CANCER CENTER ONLY)
ALT: 15 U/L (ref 0–44)
AST: 21 U/L (ref 15–41)
Albumin: 3.7 g/dL (ref 3.5–5.0)
Alkaline Phosphatase: 110 U/L (ref 38–126)
Anion gap: 7 (ref 5–15)
BUN: 13 mg/dL (ref 8–23)
CO2: 26 mmol/L (ref 22–32)
Calcium: 9 mg/dL (ref 8.9–10.3)
Chloride: 106 mmol/L (ref 98–111)
Creatinine: 1.21 mg/dL (ref 0.61–1.24)
GFR, Est AFR Am: >60 mL/min (ref >60)
GFR, Estimated: >60 mL/min (ref >60)
Glucose, Bld: 103 mg/dL — ABNORMAL HIGH (ref 70–99)
Potassium: 4.4 mmol/L (ref 3.5–5.1)
Sodium: 139 mmol/L (ref 135–145)
Total Bilirubin: 0.5 mg/dL (ref 0.3–1.2)
Total Protein: 7.9 g/dL (ref 6.5–8.1)

## 2018-10-07 LAB — CBC WITH DIFFERENTIAL (CANCER CENTER ONLY)
Abs Immature Granulocytes: 0.01 10*3/uL (ref 0.00–0.07)
Basophils Absolute: 0 10*3/uL (ref 0.0–0.1)
Basophils Relative: 1 %
Eosinophils Absolute: 0.1 10*3/uL (ref 0.0–0.5)
Eosinophils Relative: 2 %
HCT: 42.2 % (ref 39.0–52.0)
Hemoglobin: 13.7 g/dL (ref 13.0–17.0)
Immature Granulocytes: 0 %
Lymphocytes Relative: 29 %
Lymphs Abs: 1.8 10*3/uL (ref 0.7–4.0)
MCH: 33.2 pg (ref 26.0–34.0)
MCHC: 32.5 g/dL (ref 30.0–36.0)
MCV: 102.2 fL — ABNORMAL HIGH (ref 80.0–100.0)
Monocytes Absolute: 0.4 10*3/uL (ref 0.1–1.0)
Monocytes Relative: 7 %
Neutro Abs: 3.7 10*3/uL (ref 1.7–7.7)
Neutrophils Relative %: 61 %
Platelet Count: 235 10*3/uL (ref 150–400)
RBC: 4.13 MIL/uL — ABNORMAL LOW (ref 4.22–5.81)
RDW: 16.7 % — ABNORMAL HIGH (ref 11.5–15.5)
WBC Count: 6.1 10*3/uL (ref 4.0–10.5)
nRBC: 0 % (ref 0.0–0.2)

## 2018-10-07 LAB — CEA (IN HOUSE-CHCC): CEA (CHCC-In House): 106.3 ng/mL — ABNORMAL HIGH (ref 0.00–5.00)

## 2018-10-07 MED ORDER — IOHEXOL 300 MG/ML  SOLN
100.0000 mL | Freq: Once | INTRAMUSCULAR | Status: AC | PRN
  Administered 2018-10-07: 10:00:00 100 mL via INTRAVENOUS

## 2018-10-07 MED ORDER — HEPARIN SOD (PORK) LOCK FLUSH 100 UNIT/ML IV SOLN
INTRAVENOUS | Status: AC
  Administered 2018-10-07: 500 [IU] via INTRAVENOUS
  Filled 2018-10-07: qty 5

## 2018-10-07 MED ORDER — HEPARIN SOD (PORK) LOCK FLUSH 100 UNIT/ML IV SOLN
500.0000 [IU] | Freq: Once | INTRAVENOUS | Status: AC
  Administered 2018-10-07: 10:00:00 500 [IU] via INTRAVENOUS

## 2018-10-07 MED ORDER — SODIUM CHLORIDE (PF) 0.9 % IJ SOLN
INTRAMUSCULAR | Status: AC
  Filled 2018-10-07: qty 50

## 2018-10-07 MED ORDER — SODIUM CHLORIDE 0.9% FLUSH
10.0000 mL | Freq: Once | INTRAVENOUS | Status: AC
  Administered 2018-10-07: 10 mL
  Filled 2018-10-07: qty 10

## 2018-10-12 ENCOUNTER — Encounter: Payer: Self-pay | Admitting: Nurse Practitioner

## 2018-10-12 ENCOUNTER — Other Ambulatory Visit: Payer: Self-pay

## 2018-10-12 ENCOUNTER — Inpatient Hospital Stay: Payer: Medicare HMO

## 2018-10-12 ENCOUNTER — Inpatient Hospital Stay (HOSPITAL_BASED_OUTPATIENT_CLINIC_OR_DEPARTMENT_OTHER): Payer: Medicare HMO | Admitting: Nurse Practitioner

## 2018-10-12 ENCOUNTER — Telehealth: Payer: Self-pay | Admitting: Nurse Practitioner

## 2018-10-12 VITALS — BP 132/88 | HR 66 | Temp 98.0°F | Resp 18 | Ht 73.0 in | Wt 186.2 lb

## 2018-10-12 DIAGNOSIS — G620 Drug-induced polyneuropathy: Secondary | ICD-10-CM

## 2018-10-12 DIAGNOSIS — C20 Malignant neoplasm of rectum: Secondary | ICD-10-CM

## 2018-10-12 DIAGNOSIS — C787 Secondary malignant neoplasm of liver and intrahepatic bile duct: Secondary | ICD-10-CM | POA: Diagnosis not present

## 2018-10-12 DIAGNOSIS — Z8042 Family history of malignant neoplasm of prostate: Secondary | ICD-10-CM

## 2018-10-12 DIAGNOSIS — Z5111 Encounter for antineoplastic chemotherapy: Secondary | ICD-10-CM | POA: Diagnosis not present

## 2018-10-12 DIAGNOSIS — D701 Agranulocytosis secondary to cancer chemotherapy: Secondary | ICD-10-CM

## 2018-10-12 DIAGNOSIS — C7800 Secondary malignant neoplasm of unspecified lung: Secondary | ICD-10-CM | POA: Diagnosis not present

## 2018-10-12 DIAGNOSIS — Z803 Family history of malignant neoplasm of breast: Secondary | ICD-10-CM | POA: Diagnosis not present

## 2018-10-12 DIAGNOSIS — I10 Essential (primary) hypertension: Secondary | ICD-10-CM

## 2018-10-12 MED ORDER — LEUCOVORIN CALCIUM INJECTION 350 MG
400.0000 mg/m2 | Freq: Once | INTRAVENOUS | Status: AC
  Administered 2018-10-12: 836 mg via INTRAVENOUS
  Filled 2018-10-12: qty 41.8

## 2018-10-12 MED ORDER — FLUOROURACIL CHEMO INJECTION 2.5 GM/50ML
400.0000 mg/m2 | Freq: Once | INTRAVENOUS | Status: AC
  Administered 2018-10-12: 16:00:00 850 mg via INTRAVENOUS
  Filled 2018-10-12: qty 17

## 2018-10-12 MED ORDER — PALONOSETRON HCL INJECTION 0.25 MG/5ML
0.2500 mg | Freq: Once | INTRAVENOUS | Status: AC
  Administered 2018-10-12: 13:00:00 0.25 mg via INTRAVENOUS

## 2018-10-12 MED ORDER — OXALIPLATIN CHEMO INJECTION 100 MG/20ML
85.0000 mg/m2 | Freq: Once | INTRAVENOUS | Status: AC
  Administered 2018-10-12: 14:00:00 180 mg via INTRAVENOUS
  Filled 2018-10-12: qty 36

## 2018-10-12 MED ORDER — SODIUM CHLORIDE 0.9 % IV SOLN
Freq: Once | INTRAVENOUS | Status: AC
  Administered 2018-10-12: 13:00:00 via INTRAVENOUS
  Filled 2018-10-12: qty 5

## 2018-10-12 MED ORDER — PALONOSETRON HCL INJECTION 0.25 MG/5ML
INTRAVENOUS | Status: AC
  Filled 2018-10-12: qty 5

## 2018-10-12 MED ORDER — SODIUM CHLORIDE 0.9 % IV SOLN
2400.0000 mg/m2 | INTRAVENOUS | Status: DC
  Administered 2018-10-12: 16:00:00 5000 mg via INTRAVENOUS
  Filled 2018-10-12: qty 100

## 2018-10-12 MED ORDER — DEXTROSE 5 % IV SOLN
Freq: Once | INTRAVENOUS | Status: AC
  Administered 2018-10-12: 12:00:00 via INTRAVENOUS
  Filled 2018-10-12: qty 250

## 2018-10-12 NOTE — Progress Notes (Addendum)
[place] OFFICE PROGRESS NOTE   Diagnosis: Rectal cancer  INTERVAL HISTORY:   Hunter Walker returns as scheduled.  He overall feels well.  He has a good appetite.  No pain at present.  He does have pain with bowel movements.  No significant bleeding with bowel movements.  No nausea or vomiting.  No diarrhea.  Objective:  Vital signs in last 24 hours:  Blood pressure 132/88, pulse 66, temperature 98 F (36.7 C), temperature source Oral, resp. rate 18, height '6\' 1"'  (1.854 m), weight 186 lb 3.2 oz (84.5 kg), SpO2 100 %.     GI: No hepatomegaly. Vascular: No leg edema. Neuro: Vibratory sense intact over the fingertips per tuning fork exam. Skin: Palms without erythema. Port-A-Cath without erythema.  Lab Results:  Lab Results  Component Value Date   WBC 6.1 10/07/2018   HGB 13.7 10/07/2018   HCT 42.2 10/07/2018   MCV 102.2 (H) 10/07/2018   PLT 235 10/07/2018   NEUTROABS 3.7 10/07/2018    Imaging:  No results found.  Medications: I have reviewed the patient's current medications.  Assessment/Plan: 1.Rectal cancer, invasive adenocarcinoma confirmed at colonoscopy 05/22/2015, EUS staging 06/01/2015 revealed a uT3,N1 lesion at 7 cm from the anal verge  Staging CT abdomen/pelvis 05/15/2015 confirmed a mass at the rectosigmoid colon, a suspicious perirectal lymph node, and a 6 mm right liver lesion felt to most likely be a cyst  CT chest 05/30/2015 with indeterminate pulmonary nodules and multiple liver lesions suspicious for metastases  Ultrasound 06/13/2014 with no liver lesions identified  MRI abdomen 06/21/2015 consistent with multiple liver metastases  CT biopsy of liver lesion 06/26/2015. Pathology showed metastatic adenocarcinoma consistent with colonic primary.MSI-stable, low mutation burden, no RAS or BRAFmutation  Cycle 1 FOLFOX 07/10/2015  Cycle 2 FOLFOX plus Avastin 07/24/2015 with Neulasta support  Cycle 3 FOLFOX plus Avastin  08/07/2015  Cycle 4 FOLFOX plus Avastin 08/21/2015  cycle 5 FOLFOX plus Avastin 09/04/2015  Restaging CTs 09/15/2015 revealed a decrease in the size of liver lesions, decreased rectal primary, stable/decreased size of tiny lung nodules  Cycle 6 FOLFOX plus Avastin 09/18/2015  Cycle 7 FOLFOX plus Avastin 10/02/2015  Cycle 8 FOLFOX plus Avastin 10/16/2015  Cycle 9 FOLFOX plus Avastin 11/06/2015  Cycle 10 FOLFOX plus Avastin 11/20/2015  CTs 12/07/2015-mild decrease in wall thickening at the rectum, decrease in tiny hepatic metastases, stable lung nodules, no evidence of progressive metastatic disease, new left lower lobe infiltrate  Treatment changed to every three-week Avastin with every other week Xeloda beginning 12/11/2015  Restaging CTs 04/12/2016-interval increase in size of adjacent right upper lobe pulmonary nodules and right lower lobe pulmonary nodule; unchanged 5 mm lesion right hepatic lobe; no additional visualized hepatic metastasis. Grossly unchanged wall thickening of the rectum.  Continuation of Xeloda every other week and every three-week AvastinXeloda placed on hold 05/06/2016 due to hand-foot syndrome  Xeloda resumed 05/28/2016 1500 mg every morning and 1000 mg every afternoon 7 days on/7 days off  CT 09/27/2016-enlargement of lung nodules and new liver lesions, stable rectal mass  Cycle 1 FOLFIRI/panitumumab 10/08/2016  Cycle 2 panitumumab 10/23/2016 (FOLFIRI held secondary to neutropenia)  Cycle 3 FOLFIRI/PANITUMUMAB 11/04/2016  Cycle 4 FOLFIRI/panitumumab 11/18/2016  Cycle 5 FOLFIRI/panitumumab 12/16/2016  Cycle 6 FOLFIRI/panitumumab 12/31/2016  CTs 01/16/2017-decreased size of pulmonary nodules and liver lesions. Decreased perirectal lymph node  Cycle 7 FOLFIRI/PANITUMUMAB 01/28/2017  Cycle 8FOLFIRI/PANITUMUMAB 02/10/2017  Cycle 9 FOLFIRI/PANITUMUMAB 02/24/2017  Cycle 10 FOLFIRI/panitumumab 03/10/2017 (irinotecan held secondary to neutropenia)   Cycle  11 FOLFIRI/panitumumab 03/24/2017  Cycle12 FOLFIRI/Panitumumab 04/07/2017  Restaging CTs 04/25/2017-stable rectal mass with adjacent 8 mm left perirectal nodal metastasis. Prior hepatic metastases not visualized on the current study. Indeterminate 16 mm enhancing lesion present in segment 6, indeterminate. Minimal residual nodularity in the right upper lobe. No new/progressive pulmonary metastases.  Maintenance Xeloda/panitumumab 04/28/2017  Restaging CTs 08/18/2017-new and enlarging bilateral lung nodules; 2 hyperattenuating lesions in the liver appear similar to prior study; progression of wall thickening in the rectum with enlarging perirectal lymph nodes.  Initiation of radiation/Xeloda 09/01/2017, completed 09/22/2017  Cycle 1 FOLFIRI/Panitumumab 10/13/2017  Cycle 2 FOLFIRI 11/03/2017  Cycle 3 FOLFIRI 11/17/2017  Cycle 4 FOLFIRI 12/01/2017  Cycle 5 FOLFIRI 12/15/2017  CTs7/26/2019-mild progression lungs and liver; significantly improved rectal wall thickening; resolution of perirectal nodal adenopathy.  Cycle 6 FOLFIRI 12/29/2017  Cycle 7 FOLFIRI 01/12/2018  Cycle 8 FOLFIRI 01/26/2018  Cycle 9 FOLFIRI 02/09/2018  Cycle 10 FOLFIRI 02/23/2018  Restaging CTs 03/06/2018-majority of the pulmonary nodules are similar in size, at least 2 of the nodules are minimally increased in size. Similar appearing lesions within the liver. Similar-appearing mild wall thickening of the rectum.  Cycle 11FOLFIRI 03/09/2018  Cycle 12 FOLFIRI 03/23/2018  Cycle 13 FOLFIRI 04/06/2018  Cycle 14 FOLFIRI 04/20/2018  Cycle 15 FOLFIRI 05/04/2018  CT 05/15/2018-no change in pulmonary nodules, mild increase in size of 2 liver lesions, stable rectal wall thickening  On study at [facility] [place] with palbociclib andUlixertinibbeginning 07/15/2018  No longer on study at [facility] 09/28/2018  Restaging CTs 10/07/2018- enlargement of multiple lateral pulmonary nodules and liver lesions; redemonstrated  circumferential thickening of the mid rectum with interval enlargement of an irregular nodule of the left perirectal fat. 2.Multiple colon polyps on the colonoscopy 05/22/2015, tubular villous adenoma and a tubular adenoma were removed 3.Anemia-likely secondary to rectal bleeding.  4.Family history of multiple cancers including breast and prostate cancer 5.Transient right arm numbness 06/20/2015 6. Port-A-Cath placement 07/06/2015 by Dr. Marcello Moores 7. Mild neutropenia secondary to chemotherapy following cycle 1 FOLFOX. Neulasta added with cycle 2. 8. Delayed nausea following chemotherapy-emend added with cycle 3 FOLFOX;Emend added with cycle 12 FOLFIRI. 9. Early oxaliplatin neuropathy 10. 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 Xeloda resumed at a reduced dose. 11. Bilateral hand weakness. Question related to previous oxaliplatin. Referred to neurology 06/17/2016. 12. Hypertension-amlodipine started 07/08/2016 13. Rash secondary to PANITUMUMAB.  14. Neutropenia following cycle 1 FOLFIRI/PANITUMUMAB; neutropenia following cycle 4 FOLFIRI/PANITUMUMAB.  Disposition: Hunter Walker appears stable.  We reviewed the restaging CT results from 10/07/2018.  Multiple lung nodules and liver lesions are larger as well as a nodule in the left perirectal fat.  Plan to proceed with cycle 1 FOLFOX today as scheduled.  Potential toxicities again reviewed.  He agrees to proceed.  We discussed Udenyca on the day of pump discontinuation.  We reviewed potential toxicities.  He is in agreement.  He will return for lab, follow-up and cycle 2 FOLFOX in 3 weeks rather than 2 weeks per his request.  He will contact the office in the interim with any problems.  Patient seen with Dr. Benay Spice.    Ned Card ANP/GNP-BC   10/12/2018  11:34 AM  This was a shared visit with Ned Card.  Hunter Walker appears unchanged.  The restaging CTs confirmed disease progression.  We discussed the CT  findings with Hunter Walker.  The plan is to proceed with FOLFOX today.  I reviewed the CT images.  Julieanne Manson, MD

## 2018-10-12 NOTE — Patient Instructions (Signed)
[facility] Discharge Instructions for Patients Receiving Chemotherapy  Today you received the following chemotherapy agents oxaliplatin/leucovorin/florouracil   To help prevent nausea and vomiting after your treatment, we encourage you to take your nausea medication as directed.   If you develop nausea and vomiting that is not controlled by your nausea medication, call the clinic.   BELOW ARE SYMPTOMS THAT SHOULD BE REPORTED IMMEDIATELY:  *FEVER GREATER THAN 100.5 F  *CHILLS WITH OR WITHOUT FEVER  NAUSEA AND VOMITING THAT IS NOT CONTROLLED WITH YOUR NAUSEA MEDICATION  *UNUSUAL SHORTNESS OF BREATH  *UNUSUAL BRUISING OR BLEEDING  TENDERNESS IN MOUTH AND THROAT WITH OR WITHOUT PRESENCE OF ULCERS  *URINARY PROBLEMS  *BOWEL PROBLEMS  UNUSUAL RASH Items with * indicate a potential emergency and should be followed up as soon as possible.  Feel free to call the clinic you have any questions or concerns. The clinic phone number is [phone].  

## 2018-10-12 NOTE — Telephone Encounter (Signed)
Scheduled appt per 5/11 los. ° °A calendar will be mailed out. °

## 2018-10-14 ENCOUNTER — Inpatient Hospital Stay: Payer: Medicare HMO

## 2018-10-14 ENCOUNTER — Other Ambulatory Visit: Payer: Self-pay

## 2018-10-14 VITALS — BP 128/78 | HR 66 | Temp 98.2°F | Resp 17

## 2018-10-14 DIAGNOSIS — Z5111 Encounter for antineoplastic chemotherapy: Secondary | ICD-10-CM | POA: Diagnosis not present

## 2018-10-14 DIAGNOSIS — C787 Secondary malignant neoplasm of liver and intrahepatic bile duct: Secondary | ICD-10-CM | POA: Diagnosis not present

## 2018-10-14 DIAGNOSIS — C7800 Secondary malignant neoplasm of unspecified lung: Secondary | ICD-10-CM | POA: Diagnosis not present

## 2018-10-14 DIAGNOSIS — I10 Essential (primary) hypertension: Secondary | ICD-10-CM | POA: Diagnosis not present

## 2018-10-14 DIAGNOSIS — D701 Agranulocytosis secondary to cancer chemotherapy: Secondary | ICD-10-CM | POA: Diagnosis not present

## 2018-10-14 DIAGNOSIS — Z803 Family history of malignant neoplasm of breast: Secondary | ICD-10-CM | POA: Diagnosis not present

## 2018-10-14 DIAGNOSIS — C20 Malignant neoplasm of rectum: Secondary | ICD-10-CM | POA: Diagnosis not present

## 2018-10-14 DIAGNOSIS — Z8042 Family history of malignant neoplasm of prostate: Secondary | ICD-10-CM | POA: Diagnosis not present

## 2018-10-14 DIAGNOSIS — G620 Drug-induced polyneuropathy: Secondary | ICD-10-CM | POA: Diagnosis not present

## 2018-10-14 MED ORDER — HEPARIN SOD (PORK) LOCK FLUSH 100 UNIT/ML IV SOLN
500.0000 [IU] | Freq: Once | INTRAVENOUS | Status: AC | PRN
  Administered 2018-10-14: 500 [IU]
  Filled 2018-10-14: qty 5

## 2018-10-14 MED ORDER — PEGFILGRASTIM-CBQV 6 MG/0.6ML ~~LOC~~ SOSY
6.0000 mg | PREFILLED_SYRINGE | Freq: Once | SUBCUTANEOUS | Status: AC
  Administered 2018-10-14: 14:00:00 6 mg via SUBCUTANEOUS

## 2018-10-14 MED ORDER — PEGFILGRASTIM-CBQV 6 MG/0.6ML ~~LOC~~ SOSY
PREFILLED_SYRINGE | SUBCUTANEOUS | Status: AC
  Filled 2018-10-14: qty 0.6

## 2018-10-14 MED ORDER — SODIUM CHLORIDE 0.9% FLUSH
10.0000 mL | INTRAVENOUS | Status: DC | PRN
  Administered 2018-10-14: 10 mL
  Filled 2018-10-14: qty 10

## 2018-10-15 ENCOUNTER — Ambulatory Visit: Payer: Medicare HMO | Admitting: Internal Medicine

## 2018-10-15 NOTE — Progress Notes (Signed)
No charge. 

## 2018-10-22 ENCOUNTER — Encounter: Payer: Self-pay | Admitting: Internal Medicine

## 2018-10-22 ENCOUNTER — Ambulatory Visit (INDEPENDENT_AMBULATORY_CARE_PROVIDER_SITE_OTHER): Payer: Medicare HMO | Admitting: Internal Medicine

## 2018-10-22 DIAGNOSIS — G620 Drug-induced polyneuropathy: Secondary | ICD-10-CM | POA: Diagnosis not present

## 2018-10-22 DIAGNOSIS — T451X5A Adverse effect of antineoplastic and immunosuppressive drugs, initial encounter: Secondary | ICD-10-CM | POA: Diagnosis not present

## 2018-10-22 DIAGNOSIS — K219 Gastro-esophageal reflux disease without esophagitis: Secondary | ICD-10-CM

## 2018-10-22 DIAGNOSIS — I10 Essential (primary) hypertension: Secondary | ICD-10-CM | POA: Diagnosis not present

## 2018-10-22 MED ORDER — GABAPENTIN 300 MG PO CAPS: 300.0000 mg | ORAL_CAPSULE | Freq: Three times a day (TID) | ORAL | 3 refills | Status: AC

## 2018-10-22 NOTE — Assessment & Plan Note (Signed)
Rx for gabapentin which he has used in the past and dosing discussed with him as well.

## 2018-10-22 NOTE — Assessment & Plan Note (Signed)
Refill protonix as needed, working well. Benefit outweighs risk at this time.

## 2018-10-22 NOTE — Progress Notes (Signed)
Virtual Visit via Video Note  I connected with Hunter Walker on 10/22/18 at  9:20 AM EDT by a video enabled telemedicine application and verified that I am speaking with the correct person using two identifiers.  The patient and the provider were at separate locations throughout the entire encounter.   I discussed the limitations of evaluation and management by telemedicine and the availability of in person appointments. The patient expressed understanding and agreed to proceed.  History of Present Illness: The patient is a 68 y.o. man with visit for follow up of his chemo induced neuropathy (back on chemo in the last week or so due to recurrence, has no gabapentin, the chemo he is doing now gives him this, he has it in his feet, gets some constipation with gabapentin but it helps so he would like refill) and blood pressure (taking amlodipine, denies chest pains or headaches, denies missing doses, no low BP with the chemo, denies side effects from it), and GERD (still taking protonix, denies break through symptoms, denies blood in stool, tries not to miss doses).  Observations/Objective: Appearance: normal, breathing appears normal, casual grooming, abdomen does not appear distended, throat normal, EOM intact, mental status is A and O times 3  Assessment and Plan: See problem oriented charting  Follow Up Instructions: rx gabapentin 300 mg for neuropathy, labs reviewed from cancer center, refill meds as needed  I discussed the assessment and treatment plan with the patient. The patient was provided an opportunity to ask questions and all were answered. The patient agreed with the plan and demonstrated an understanding of the instructions.   The patient was advised to call back or seek an in-person evaluation if the symptoms worsen or if the condition fails to improve as anticipated.  [name], MD

## 2018-10-22 NOTE — Assessment & Plan Note (Signed)
Refill amlodipine 5 mg daily as needed. Recent labs with stable GFR and creatinine.

## 2018-10-26 ENCOUNTER — Other Ambulatory Visit: Payer: Self-pay | Admitting: Oncology

## 2018-11-02 ENCOUNTER — Inpatient Hospital Stay: Payer: Medicare HMO

## 2018-11-02 ENCOUNTER — Inpatient Hospital Stay: Payer: Medicare HMO | Attending: Nurse Practitioner

## 2018-11-02 ENCOUNTER — Inpatient Hospital Stay (HOSPITAL_BASED_OUTPATIENT_CLINIC_OR_DEPARTMENT_OTHER): Payer: Medicare HMO | Admitting: Nurse Practitioner

## 2018-11-02 ENCOUNTER — Encounter: Payer: Self-pay | Admitting: Nurse Practitioner

## 2018-11-02 ENCOUNTER — Other Ambulatory Visit: Payer: Self-pay

## 2018-11-02 ENCOUNTER — Inpatient Hospital Stay (HOSPITAL_BASED_OUTPATIENT_CLINIC_OR_DEPARTMENT_OTHER): Payer: Medicare HMO | Admitting: Medical

## 2018-11-02 VITALS — BP 129/88 | HR 68 | Temp 98.0°F | Resp 18 | Ht 73.0 in | Wt 186.3 lb

## 2018-11-02 VITALS — BP 120/75 | Resp 18

## 2018-11-02 DIAGNOSIS — Z5111 Encounter for antineoplastic chemotherapy: Secondary | ICD-10-CM | POA: Diagnosis present

## 2018-11-02 DIAGNOSIS — C20 Malignant neoplasm of rectum: Secondary | ICD-10-CM

## 2018-11-02 DIAGNOSIS — Z8042 Family history of malignant neoplasm of prostate: Secondary | ICD-10-CM | POA: Diagnosis not present

## 2018-11-02 DIAGNOSIS — I10 Essential (primary) hypertension: Secondary | ICD-10-CM | POA: Insufficient documentation

## 2018-11-02 DIAGNOSIS — D701 Agranulocytosis secondary to cancer chemotherapy: Secondary | ICD-10-CM

## 2018-11-02 DIAGNOSIS — Z803 Family history of malignant neoplasm of breast: Secondary | ICD-10-CM

## 2018-11-02 DIAGNOSIS — C7800 Secondary malignant neoplasm of unspecified lung: Secondary | ICD-10-CM

## 2018-11-02 DIAGNOSIS — Z5189 Encounter for other specified aftercare: Secondary | ICD-10-CM | POA: Insufficient documentation

## 2018-11-02 DIAGNOSIS — Z95828 Presence of other vascular implants and grafts: Secondary | ICD-10-CM

## 2018-11-02 DIAGNOSIS — G620 Drug-induced polyneuropathy: Secondary | ICD-10-CM

## 2018-11-02 DIAGNOSIS — C787 Secondary malignant neoplasm of liver and intrahepatic bile duct: Secondary | ICD-10-CM | POA: Diagnosis not present

## 2018-11-02 DIAGNOSIS — R110 Nausea: Secondary | ICD-10-CM

## 2018-11-02 DIAGNOSIS — T8090XA Unspecified complication following infusion and therapeutic injection, initial encounter: Secondary | ICD-10-CM

## 2018-11-02 LAB — CMP (CANCER CENTER ONLY)
ALT: 13 U/L (ref 0–44)
AST: 19 U/L (ref 15–41)
Albumin: 3.5 g/dL (ref 3.5–5.0)
Alkaline Phosphatase: 121 U/L (ref 38–126)
Anion gap: 6 (ref 5–15)
BUN: 13 mg/dL (ref 8–23)
CO2: 26 mmol/L (ref 22–32)
Calcium: 8.7 mg/dL — ABNORMAL LOW (ref 8.9–10.3)
Chloride: 106 mmol/L (ref 98–111)
Creatinine: 1.17 mg/dL (ref 0.61–1.24)
GFR, Est AFR Am: >60 mL/min (ref >60)
GFR, Estimated: >60 mL/min (ref >60)
Glucose, Bld: 94 mg/dL (ref 70–99)
Potassium: 4.3 mmol/L (ref 3.5–5.1)
Sodium: 138 mmol/L (ref 135–145)
Total Bilirubin: 0.4 mg/dL (ref 0.3–1.2)
Total Protein: 7.8 g/dL (ref 6.5–8.1)

## 2018-11-02 LAB — CBC WITH DIFFERENTIAL (CANCER CENTER ONLY)
Abs Immature Granulocytes: 0.03 10*3/uL (ref 0.00–0.07)
Basophils Absolute: 0.1 10*3/uL (ref 0.0–0.1)
Basophils Relative: 1 %
Eosinophils Absolute: 0.1 10*3/uL (ref 0.0–0.5)
Eosinophils Relative: 1 %
HCT: 40.2 % (ref 39.0–52.0)
Hemoglobin: 13.1 g/dL (ref 13.0–17.0)
Immature Granulocytes: 0 %
Lymphocytes Relative: 20 %
Lymphs Abs: 2 10*3/uL (ref 0.7–4.0)
MCH: 32.8 pg (ref 26.0–34.0)
MCHC: 32.6 g/dL (ref 30.0–36.0)
MCV: 100.8 fL — ABNORMAL HIGH (ref 80.0–100.0)
Monocytes Absolute: 0.6 10*3/uL (ref 0.1–1.0)
Monocytes Relative: 6 %
Neutro Abs: 7.2 10*3/uL (ref 1.7–7.7)
Neutrophils Relative %: 72 %
Platelet Count: 231 10*3/uL (ref 150–400)
RBC: 3.99 MIL/uL — ABNORMAL LOW (ref 4.22–5.81)
RDW: 15.2 % (ref 11.5–15.5)
WBC Count: 10 10*3/uL (ref 4.0–10.5)
nRBC: 0 % (ref 0.0–0.2)

## 2018-11-02 MED ORDER — SODIUM CHLORIDE 0.9 % IV SOLN
2400.0000 mg/m2 | INTRAVENOUS | Status: DC
  Administered 2018-11-02: 5000 mg via INTRAVENOUS
  Filled 2018-11-02: qty 100

## 2018-11-02 MED ORDER — PALONOSETRON HCL INJECTION 0.25 MG/5ML
INTRAVENOUS | Status: AC
  Filled 2018-11-02: qty 5

## 2018-11-02 MED ORDER — SODIUM CHLORIDE 0.9% FLUSH
10.0000 mL | Freq: Once | INTRAVENOUS | Status: AC
  Administered 2018-11-02: 10 mL
  Filled 2018-11-02: qty 10

## 2018-11-02 MED ORDER — LEUCOVORIN CALCIUM INJECTION 350 MG
400.0000 mg/m2 | Freq: Once | INTRAVENOUS | Status: AC
  Administered 2018-11-02: 836 mg via INTRAVENOUS
  Filled 2018-11-02: qty 41.8

## 2018-11-02 MED ORDER — DIPHENHYDRAMINE HCL 50 MG/ML IJ SOLN
25.0000 mg | Freq: Once | INTRAMUSCULAR | Status: AC
  Administered 2018-11-02: 25 mg via INTRAVENOUS

## 2018-11-02 MED ORDER — DEXTROSE 5 % IV SOLN
Freq: Once | INTRAVENOUS | Status: AC
  Administered 2018-11-02: 12:00:00 via INTRAVENOUS
  Filled 2018-11-02: qty 250

## 2018-11-02 MED ORDER — SODIUM CHLORIDE 0.9 % IV SOLN
Freq: Once | INTRAVENOUS | Status: AC
  Administered 2018-11-02: 15:00:00 via INTRAVENOUS
  Filled 2018-11-02: qty 250

## 2018-11-02 MED ORDER — OXALIPLATIN CHEMO INJECTION 100 MG/20ML
85.0000 mg/m2 | Freq: Once | INTRAVENOUS | Status: AC
  Administered 2018-11-02: 180 mg via INTRAVENOUS
  Filled 2018-11-02: qty 36

## 2018-11-02 MED ORDER — FAMOTIDINE IN NACL 20-0.9 MG/50ML-% IV SOLN
20.0000 mg | Freq: Once | INTRAVENOUS | Status: AC
  Administered 2018-11-02: 15:00:00 20 mg via INTRAVENOUS

## 2018-11-02 MED ORDER — SODIUM CHLORIDE 0.9 % IV SOLN
Freq: Once | INTRAVENOUS | Status: AC
  Administered 2018-11-02: 12:00:00 via INTRAVENOUS
  Filled 2018-11-02: qty 5

## 2018-11-02 MED ORDER — PALONOSETRON HCL INJECTION 0.25 MG/5ML
0.2500 mg | Freq: Once | INTRAVENOUS | Status: AC
  Administered 2018-11-02: 0.25 mg via INTRAVENOUS

## 2018-11-02 MED ORDER — FLUOROURACIL CHEMO INJECTION 2.5 GM/50ML
400.0000 mg/m2 | Freq: Once | INTRAVENOUS | Status: AC
  Administered 2018-11-02: 850 mg via INTRAVENOUS
  Filled 2018-11-02: qty 17

## 2018-11-02 NOTE — Progress Notes (Signed)
1510--Pt in bathroom and pulled called bell.  Stated he felt lightheaded, nauseas, and itchy. Arms were observably red. Pt transferred to wheel chair and moved back to beds. NS started at 1513. Sandi Mealy, PA notified and at bedside.  Pepcid and benadryl given (see MAR). Pt stated he was starting to feel better. Sandi Mealy, PA spoke with Dr. Benay Spice and we were advised to not continue on with the oxaliplatin and leucovorin, but will continue with 5FU push and pump.  Will continue to follow.

## 2018-11-02 NOTE — Progress Notes (Signed)
[facility] OFFICE PROGRESS NOTE   Diagnosis: Rectal cancer  INTERVAL HISTORY:   Hunter Walker returns as scheduled.  He completed cycle 1 FOLFOX 10/12/2018.  He had mild nausea.  No vomiting.  No mouth sores.  No diarrhea.  Bowels overall moving regularly.  He noted cold sensitivity for a few days.  No numbness or tingling today.  He denies bleeding.  Objective:  Vital signs in last 24 hours:  Blood pressure 129/88, pulse 68, temperature 98 F (36.7 C), temperature source Oral, resp. rate 18, height '6\' 1"'  (1.854 m), weight 186 lb 4.8 oz (84.5 kg), SpO2 100 %.    Vascular: No leg edema.  Skin: Palms without erythema. Port-A-Cath without erythema.   Lab Results:  Lab Results  Component Value Date   WBC 10.0 11/02/2018   HGB 13.1 11/02/2018   HCT 40.2 11/02/2018   MCV 100.8 (H) 11/02/2018   PLT 231 11/02/2018   NEUTROABS 7.2 11/02/2018    Imaging:  No results found.  Medications: I have reviewed the patient's current medications.  Assessment/Plan: 1.Rectal cancer, invasive adenocarcinoma confirmed at colonoscopy 05/22/2015, EUS staging 06/01/2015 revealed a uT3,N1 lesion at 7 cm from the anal verge  Staging CT abdomen/pelvis 05/15/2015 confirmed a mass at the rectosigmoid colon, a suspicious perirectal lymph node, and a 6 mm right liver lesion felt to most likely be a cyst  CT chest 05/30/2015 with indeterminate pulmonary nodules and multiple liver lesions suspicious for metastases  Ultrasound 06/13/2014 with no liver lesions identified  MRI abdomen 06/21/2015 consistent with multiple liver metastases  CT biopsy of liver lesion 06/26/2015. Pathology showed metastatic adenocarcinoma consistent with colonic primary.MSI-stable, low mutation burden, no RAS or BRAFmutation  Cycle 1 FOLFOX 07/10/2015  Cycle 2 FOLFOX plus Avastin 07/24/2015 with Neulasta support  Cycle 3 FOLFOX plus Avastin 08/07/2015  Cycle 4 FOLFOX plus Avastin 08/21/2015  cycle  5 FOLFOX plus Avastin 09/04/2015  Restaging CTs 09/15/2015 revealed a decrease in the size of liver lesions, decreased rectal primary, stable/decreased size of tiny lung nodules  Cycle 6 FOLFOX plus Avastin 09/18/2015  Cycle 7 FOLFOX plus Avastin 10/02/2015  Cycle 8 FOLFOX plus Avastin 10/16/2015  Cycle 9 FOLFOX plus Avastin 11/06/2015  Cycle 10 FOLFOX plus Avastin 11/20/2015  CTs 12/07/2015-mild decrease in wall thickening at the rectum, decrease in tiny hepatic metastases, stable lung nodules, no evidence of progressive metastatic disease, new left lower lobe infiltrate  Treatment changed to every three-week Avastin with every other week Xeloda beginning 12/11/2015  Restaging CTs 04/12/2016-interval increase in size of adjacent right upper lobe pulmonary nodules and right lower lobe pulmonary nodule; unchanged 5 mm lesion right hepatic lobe; no additional visualized hepatic metastasis. Grossly unchanged wall thickening of the rectum.  Continuation of Xeloda every other week and every three-week AvastinXeloda placed on hold 05/06/2016 due to hand-foot syndrome  Xeloda resumed 05/28/2016 1500 mg every morning and 1000 mg every afternoon 7 days on/7 days off  CT 09/27/2016-enlargement of lung nodules and new liver lesions, stable rectal mass  Cycle 1 FOLFIRI/panitumumab 10/08/2016  Cycle 2 panitumumab 10/23/2016 (FOLFIRI held secondary to neutropenia)  Cycle 3 FOLFIRI/PANITUMUMAB 11/04/2016  Cycle 4 FOLFIRI/panitumumab 11/18/2016  Cycle 5 FOLFIRI/panitumumab 12/16/2016  Cycle 6 FOLFIRI/panitumumab 12/31/2016  CTs 01/16/2017-decreased size of pulmonary nodules and liver lesions. Decreased perirectal lymph node  Cycle 7 FOLFIRI/PANITUMUMAB 01/28/2017  Cycle 8FOLFIRI/PANITUMUMAB 02/10/2017  Cycle 9 FOLFIRI/PANITUMUMAB 02/24/2017  Cycle 10 FOLFIRI/panitumumab 03/10/2017 (irinotecan held secondary to neutropenia)  Cycle 11 FOLFIRI/panitumumab 03/24/2017  Cycle12  FOLFIRI/Panitumumab  04/07/2017  Restaging CTs 04/25/2017-stable rectal mass with adjacent 8 mm left perirectal nodal metastasis. Prior hepatic metastases not visualized on the current study. Indeterminate 16 mm enhancing lesion present in segment 6, indeterminate. Minimal residual nodularity in the right upper lobe. No new/progressive pulmonary metastases.  Maintenance Xeloda/panitumumab 04/28/2017  Restaging CTs 08/18/2017-new and enlarging bilateral lung nodules; 2 hyperattenuating lesions in the liver appear similar to prior study; progression of wall thickening in the rectum with enlarging perirectal lymph nodes.  Initiation of radiation/Xeloda 09/01/2017, completed 09/22/2017  Cycle 1 FOLFIRI/Panitumumab 10/13/2017  Cycle 2 FOLFIRI 11/03/2017  Cycle 3 FOLFIRI 11/17/2017  Cycle 4 FOLFIRI 12/01/2017  Cycle 5 FOLFIRI 12/15/2017  CTs7/26/2019-mild progression lungs and liver; significantly improved rectal wall thickening; resolution of perirectal nodal adenopathy.  Cycle 6 FOLFIRI 12/29/2017  Cycle 7 FOLFIRI 01/12/2018  Cycle 8 FOLFIRI 01/26/2018  Cycle 9 FOLFIRI 02/09/2018  Cycle 10 FOLFIRI 02/23/2018  Restaging CTs 03/06/2018-majority of the pulmonary nodules are similar in size, at least 2 of the nodules are minimally increased in size. Similar appearing lesions within the liver. Similar-appearing mild wall thickening of the rectum.  Cycle 11FOLFIRI 03/09/2018  Cycle 12 FOLFIRI 03/23/2018  Cycle 13 FOLFIRI 04/06/2018  Cycle 14 FOLFIRI 04/20/2018  Cycle 15 FOLFIRI 05/04/2018  CT 05/15/2018-no change in pulmonary nodules, mild increase in size of 2 liver lesions, stable rectal wall thickening  On study at [facility], [place] with palbociclib andUlixertinibbeginning 07/15/2018  [facility]  Restaging CTs 10/07/2018- enlargement of multiple lateral pulmonary nodules and liver lesions; redemonstrated circumferential thickening of the mid rectum with interval  enlargement of an irregular nodule of the left perirectal fat.  Cycle 1 FOLFOX 10/12/2018  Cycle 2 FOLFOX 11/02/2018 2.Multiple colon polyps on the colonoscopy 05/22/2015, tubular villous adenoma and a tubular adenoma were removed 3.Anemia-likely secondary to rectal bleeding.  4.Family history of multiple cancers including breast and prostate cancer 5.Transient right arm numbness 06/20/2015 6. Port-A-Cath placement 07/06/2015 by Dr. Marcello Moores 7. Mild neutropenia secondary to chemotherapy following cycle 1 FOLFOX. Neulasta added with cycle 2. 8. Delayed nausea following chemotherapy-emend added with cycle 3 FOLFOX;Emend added with cycle 12 FOLFIRI. 9. Early oxaliplatin neuropathy 10. 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 Xeloda resumed at a reduced dose. 11. Bilateral hand weakness. Question related to previous oxaliplatin. Referred to neurology 06/17/2016. 12. Hypertension-amlodipine started 07/08/2016 13. Rash secondary to PANITUMUMAB.  14. Neutropenia following cycle 1 FOLFIRI/PANITUMUMAB; neutropenia following cycle 4 FOLFIRI/PANITUMUMAB.   Disposition: Hunter Walker appears stable.  He has completed 1 cycle of FOLFOX.  Overall he tolerated well.  Plan to proceed with cycle 2 today as scheduled.  We reviewed the CBC from today.  Counts adequate to proceed with treatment as above.  He will return for lab, follow-up, cycle 3 FOLFOX in 2 weeks.  He will contact the office in the interim with any problems.    Ned Card ANP/GNP-BC   11/02/2018  11:00 AM

## 2018-11-02 NOTE — Patient Instructions (Signed)
[facility] Discharge Instructions for Patients Receiving Chemotherapy  Today you received the following chemotherapy agents oxaliplatin/leucovorin/florouracil   To help prevent nausea and vomiting after your treatment, we encourage you to take your nausea medication as directed.   If you develop nausea and vomiting that is not controlled by your nausea medication, call the clinic.   BELOW ARE SYMPTOMS THAT SHOULD BE REPORTED IMMEDIATELY:  *FEVER GREATER THAN 100.5 F  *CHILLS WITH OR WITHOUT FEVER  NAUSEA AND VOMITING THAT IS NOT CONTROLLED WITH YOUR NAUSEA MEDICATION  *UNUSUAL SHORTNESS OF BREATH  *UNUSUAL BRUISING OR BLEEDING  TENDERNESS IN MOUTH AND THROAT WITH OR WITHOUT PRESENCE OF ULCERS  *URINARY PROBLEMS  *BOWEL PROBLEMS  UNUSUAL RASH Items with * indicate a potential emergency and should be followed up as soon as possible.  Feel free to call the clinic you have any questions or concerns. The clinic phone number is [phone].  

## 2018-11-03 ENCOUNTER — Telehealth: Payer: Self-pay | Admitting: Nurse Practitioner

## 2018-11-03 NOTE — Progress Notes (Signed)
    DATE:  11/02/2018                                          X  CHEMO/IMMUNOTHERAPY REACTION            MD:  Dr. Dominica Severin B. Sherrill   AGENT/BLOOD PRODUCT RECEIVING TODAY:               5-FU, leucovorin, and oxaliplatin   AGENT/BLOOD PRODUCT RECEIVING IMMEDIATELY PRIOR TO REACTION:          leucovorin and oxaliplatin  VS: BP:      85/52   P:        90       SPO2:        93% on room air                BP:      86/57   P:        86           REACTION(S):            Itching, dizziness, paresthesia of the hands, and erythema of the hands   PREMEDS:      Aloxi, Decadron, and Emend   INTERVENTION: Pepcid 20 mg IV x1 and Benadryl 25 mg IV x1   Review of Systems  Review of Systems  Constitutional: Negative for chills, diaphoresis and fever.  HENT: Negative for trouble swallowing and voice change.   Respiratory: Negative for cough, chest tightness, shortness of breath and wheezing.   Cardiovascular: Negative for chest pain and palpitations.  Gastrointestinal: Negative for abdominal pain, constipation, diarrhea, nausea and vomiting.  Musculoskeletal: Negative for back pain and myalgias.  Skin: Positive for color change.       Itching, paresthesia, and erythema of the hands.  Neurological: Positive for dizziness. Negative for light-headedness and headaches.     Physical Exam  Physical Exam Constitutional:      General: He is not in acute distress.    Appearance: He is not diaphoretic.  HENT:     Head: Normocephalic and atraumatic.  Cardiovascular:     Rate and Rhythm: Normal rate and regular rhythm.     Heart sounds: Normal heart sounds. No murmur. No friction rub. No gallop.   Pulmonary:     Effort: Pulmonary effort is normal. No respiratory distress.     Breath sounds: Normal breath sounds. No wheezing or rales.  Skin:    General: Skin is warm and dry.     Findings: Erythema present. No rash.     Comments: Erythema of the hands bilaterally.  Neurological:     Mental Status:  He is alert.     OUTCOME:                 Patient's symptoms abated after receiving Pepcid 20 mg IV x1 and Benadryl 25 mg IV x1.  Oxaliplatin and leucovorin were stopped.  The patient was started on his 5-FU push and infusion pump.   Sandi Mealy, MHS, PA-C  This case was discussed with Dr. Benay Spice. He expressed agreement with my management of this patient.

## 2018-11-03 NOTE — Telephone Encounter (Signed)
Scheduled appt per 6/1 los. 

## 2018-11-04 ENCOUNTER — Other Ambulatory Visit: Payer: Self-pay

## 2018-11-04 ENCOUNTER — Inpatient Hospital Stay: Payer: Medicare HMO

## 2018-11-04 VITALS — BP 120/72 | HR 68 | Temp 98.5°F | Resp 18

## 2018-11-04 DIAGNOSIS — C20 Malignant neoplasm of rectum: Secondary | ICD-10-CM | POA: Diagnosis not present

## 2018-11-04 DIAGNOSIS — R110 Nausea: Secondary | ICD-10-CM | POA: Diagnosis not present

## 2018-11-04 DIAGNOSIS — C787 Secondary malignant neoplasm of liver and intrahepatic bile duct: Secondary | ICD-10-CM | POA: Diagnosis not present

## 2018-11-04 DIAGNOSIS — Z8042 Family history of malignant neoplasm of prostate: Secondary | ICD-10-CM | POA: Diagnosis not present

## 2018-11-04 DIAGNOSIS — Z803 Family history of malignant neoplasm of breast: Secondary | ICD-10-CM | POA: Diagnosis not present

## 2018-11-04 DIAGNOSIS — I10 Essential (primary) hypertension: Secondary | ICD-10-CM | POA: Diagnosis not present

## 2018-11-04 DIAGNOSIS — G620 Drug-induced polyneuropathy: Secondary | ICD-10-CM | POA: Diagnosis not present

## 2018-11-04 DIAGNOSIS — D701 Agranulocytosis secondary to cancer chemotherapy: Secondary | ICD-10-CM | POA: Diagnosis not present

## 2018-11-04 DIAGNOSIS — C7800 Secondary malignant neoplasm of unspecified lung: Secondary | ICD-10-CM | POA: Diagnosis not present

## 2018-11-04 MED ORDER — HEPARIN SOD (PORK) LOCK FLUSH 100 UNIT/ML IV SOLN
500.0000 [IU] | Freq: Once | INTRAVENOUS | Status: AC | PRN
  Administered 2018-11-04: 500 [IU]
  Filled 2018-11-04: qty 5

## 2018-11-04 MED ORDER — PEGFILGRASTIM-CBQV 6 MG/0.6ML ~~LOC~~ SOSY
6.0000 mg | PREFILLED_SYRINGE | Freq: Once | SUBCUTANEOUS | Status: AC
  Administered 2018-11-04: 13:00:00 6 mg via SUBCUTANEOUS

## 2018-11-04 MED ORDER — PEGFILGRASTIM-CBQV 6 MG/0.6ML ~~LOC~~ SOSY
PREFILLED_SYRINGE | SUBCUTANEOUS | Status: AC
  Filled 2018-11-04: qty 0.6

## 2018-11-04 MED ORDER — SODIUM CHLORIDE 0.9% FLUSH
10.0000 mL | INTRAVENOUS | Status: DC | PRN
  Administered 2018-11-04: 10 mL
  Filled 2018-11-04: qty 10

## 2018-11-13 ENCOUNTER — Other Ambulatory Visit: Payer: Self-pay | Admitting: *Deleted

## 2018-11-13 DIAGNOSIS — C20 Malignant neoplasm of rectum: Secondary | ICD-10-CM

## 2018-11-16 ENCOUNTER — Inpatient Hospital Stay: Payer: Medicare HMO

## 2018-11-16 ENCOUNTER — Other Ambulatory Visit: Payer: Self-pay

## 2018-11-16 ENCOUNTER — Inpatient Hospital Stay (HOSPITAL_BASED_OUTPATIENT_CLINIC_OR_DEPARTMENT_OTHER): Payer: Medicare HMO | Admitting: Oncology

## 2018-11-16 VITALS — BP 124/77 | HR 77 | Temp 98.2°F | Resp 17 | Ht 73.0 in | Wt 183.3 lb

## 2018-11-16 DIAGNOSIS — Z803 Family history of malignant neoplasm of breast: Secondary | ICD-10-CM | POA: Diagnosis not present

## 2018-11-16 DIAGNOSIS — C787 Secondary malignant neoplasm of liver and intrahepatic bile duct: Secondary | ICD-10-CM

## 2018-11-16 DIAGNOSIS — D701 Agranulocytosis secondary to cancer chemotherapy: Secondary | ICD-10-CM | POA: Diagnosis not present

## 2018-11-16 DIAGNOSIS — C20 Malignant neoplasm of rectum: Secondary | ICD-10-CM | POA: Diagnosis not present

## 2018-11-16 DIAGNOSIS — I10 Essential (primary) hypertension: Secondary | ICD-10-CM | POA: Diagnosis not present

## 2018-11-16 DIAGNOSIS — G620 Drug-induced polyneuropathy: Secondary | ICD-10-CM

## 2018-11-16 DIAGNOSIS — C7800 Secondary malignant neoplasm of unspecified lung: Secondary | ICD-10-CM | POA: Diagnosis not present

## 2018-11-16 DIAGNOSIS — Z8042 Family history of malignant neoplasm of prostate: Secondary | ICD-10-CM | POA: Diagnosis not present

## 2018-11-16 DIAGNOSIS — R110 Nausea: Secondary | ICD-10-CM | POA: Diagnosis not present

## 2018-11-16 DIAGNOSIS — Z95828 Presence of other vascular implants and grafts: Secondary | ICD-10-CM

## 2018-11-16 LAB — CBC WITH DIFFERENTIAL (CANCER CENTER ONLY)
Abs Immature Granulocytes: 0.05 10*3/uL (ref 0.00–0.07)
Basophils Absolute: 0.1 10*3/uL (ref 0.0–0.1)
Basophils Relative: 1 %
Eosinophils Absolute: 0.1 10*3/uL (ref 0.0–0.5)
Eosinophils Relative: 2 %
HCT: 39.8 % (ref 39.0–52.0)
Hemoglobin: 12.7 g/dL — ABNORMAL LOW (ref 13.0–17.0)
Immature Granulocytes: 1 %
Lymphocytes Relative: 18 %
Lymphs Abs: 1.6 10*3/uL (ref 0.7–4.0)
MCH: 32.6 pg (ref 26.0–34.0)
MCHC: 31.9 g/dL (ref 30.0–36.0)
MCV: 102.3 fL — ABNORMAL HIGH (ref 80.0–100.0)
Monocytes Absolute: 0.5 10*3/uL (ref 0.1–1.0)
Monocytes Relative: 6 %
Neutro Abs: 6.7 10*3/uL (ref 1.7–7.7)
Neutrophils Relative %: 72 %
Platelet Count: 219 10*3/uL (ref 150–400)
RBC: 3.89 MIL/uL — ABNORMAL LOW (ref 4.22–5.81)
RDW: 14.6 % (ref 11.5–15.5)
WBC Count: 9.1 10*3/uL (ref 4.0–10.5)
nRBC: 0 % (ref 0.0–0.2)

## 2018-11-16 LAB — CMP (CANCER CENTER ONLY)
ALT: 15 U/L (ref 0–44)
AST: 17 U/L (ref 15–41)
Albumin: 3.4 g/dL — ABNORMAL LOW (ref 3.5–5.0)
Alkaline Phosphatase: 121 U/L (ref 38–126)
Anion gap: 9 (ref 5–15)
BUN: 13 mg/dL (ref 8–23)
CO2: 22 mmol/L (ref 22–32)
Calcium: 8.9 mg/dL (ref 8.9–10.3)
Chloride: 107 mmol/L (ref 98–111)
Creatinine: 1.25 mg/dL — ABNORMAL HIGH (ref 0.61–1.24)
GFR, Est AFR Am: >60 mL/min (ref >60)
GFR, Estimated: 60 mL/min — ABNORMAL LOW (ref >60)
Glucose, Bld: 133 mg/dL — ABNORMAL HIGH (ref 70–99)
Potassium: 3.9 mmol/L (ref 3.5–5.1)
Sodium: 138 mmol/L (ref 135–145)
Total Bilirubin: 0.4 mg/dL (ref 0.3–1.2)
Total Protein: 7.9 g/dL (ref 6.5–8.1)

## 2018-11-16 MED ORDER — HEPARIN SOD (PORK) LOCK FLUSH 100 UNIT/ML IV SOLN
500.0000 [IU] | Freq: Once | INTRAVENOUS | Status: AC
  Administered 2018-11-16: 500 [IU] via INTRAVENOUS
  Filled 2018-11-16: qty 5

## 2018-11-16 MED ORDER — SODIUM CHLORIDE 0.9% FLUSH
10.0000 mL | Freq: Once | INTRAVENOUS | Status: AC
  Administered 2018-11-16: 10 mL
  Filled 2018-11-16: qty 10

## 2018-11-16 MED ORDER — SODIUM CHLORIDE 0.9% FLUSH
10.0000 mL | INTRAVENOUS | Status: DC | PRN
  Administered 2018-11-16: 10 mL via INTRAVENOUS
  Filled 2018-11-16: qty 10

## 2018-11-16 NOTE — Progress Notes (Signed)
[facility] OFFICE PROGRESS NOTE   Diagnosis: Rectal cancer  INTERVAL HISTORY:   Hunter Walker completed another cycle of FOLFOX on 11/02/2018.  Minutes into the oxaliplatin infusion he noted rectal burning, pruritus and a diffuse erythematous rash.  He received Pepcid and Benadryl.  The symptoms resolved.  He denies difficulty swallowing and shortness of breath.  He was noted to be hypotensive during the reaction.  He completed a course of 5-fluorouracil.  He reports mild worsening of peripheral neuropathy symptoms since resuming oxaliplatin.  He has aching in the legs after receiving G-CSF.  Hunter Walker reports rectal urgency.  No pain.  Objective:  Vital signs in last 24 hours:  Blood pressure 124/77, pulse 77, temperature 98.2 F (36.8 C), temperature source Oral, resp. rate 17, height '6\' 1"'  (1.854 m), weight 183 lb 4.8 oz (83.1 kg), SpO2 100 %.   Limited physical examination secondary to distancing with the COVID pandemic GI: No hepatomegaly, no mass, nontender Vascular: No leg edema  Skin: Palms without erythema  Portacath/PICC-without erythema  Lab Results:  Lab Results  Component Value Date   WBC 9.1 11/16/2018   HGB 12.7 (L) 11/16/2018   HCT 39.8 11/16/2018   MCV 102.3 (H) 11/16/2018   PLT 219 11/16/2018   NEUTROABS 6.7 11/16/2018    CMP  Lab Results  Component Value Date   NA 138 11/16/2018   K 3.9 11/16/2018   CL 107 11/16/2018   CO2 22 11/16/2018   GLUCOSE 133 (H) 11/16/2018   BUN 13 11/16/2018   CREATININE 1.25 (H) 11/16/2018   CALCIUM 8.9 11/16/2018   PROT 7.9 11/16/2018   ALBUMIN 3.4 (L) 11/16/2018   AST 17 11/16/2018   ALT 15 11/16/2018   ALKPHOS 121 11/16/2018   BILITOT 0.4 11/16/2018   GFRNONAA 60 (L) 11/16/2018   GFRAA >60 11/16/2018    Lab Results  Component Value Date   CEA1 106.30 (H) 10/07/2018    Lab Results  Component Value Date   INR 1.09 06/26/2015    Imaging:  No results found.  Medications: I have  reviewed the patient's current medications.   Assessment/Plan: 1.Rectal cancer, invasive adenocarcinoma confirmed at colonoscopy 05/22/2015, EUS staging 06/01/2015 revealed a uT3,N1 lesion at 7 cm from the anal verge  Staging CT abdomen/pelvis 05/15/2015 confirmed a mass at the rectosigmoid colon, a suspicious perirectal lymph node, and a 6 mm right liver lesion felt to most likely be a cyst  CT chest 05/30/2015 with indeterminate pulmonary nodules and multiple liver lesions suspicious for metastases  Ultrasound 06/13/2014 with no liver lesions identified  MRI abdomen 06/21/2015 consistent with multiple liver metastases  CT biopsy of liver lesion 06/26/2015. Pathology showed metastatic adenocarcinoma consistent with colonic primary.MSI-stable, low mutation burden, no RAS or BRAFmutation  Cycle 1 FOLFOX 07/10/2015  Cycle 2 FOLFOX plus Avastin 07/24/2015 with Neulasta support  Cycle 3 FOLFOX plus Avastin 08/07/2015  Cycle 4 FOLFOX plus Avastin 08/21/2015  cycle 5 FOLFOX plus Avastin 09/04/2015  Restaging CTs 09/15/2015 revealed a decrease in the size of liver lesions, decreased rectal primary, stable/decreased size of tiny lung nodules  Cycle 6 FOLFOX plus Avastin 09/18/2015  Cycle 7 FOLFOX plus Avastin 10/02/2015  Cycle 8 FOLFOX plus Avastin 10/16/2015  Cycle 9 FOLFOX plus Avastin 11/06/2015  Cycle 10 FOLFOX plus Avastin 11/20/2015  CTs 12/07/2015-mild decrease in wall thickening at the rectum, decrease in tiny hepatic metastases, stable lung nodules, no evidence of progressive metastatic disease, new left lower lobe infiltrate  Treatment changed to  every three-week Avastin with every other week Xeloda beginning 12/11/2015  Restaging CTs 04/12/2016-interval increase in size of adjacent right upper lobe pulmonary nodules and right lower lobe pulmonary nodule; unchanged 5 mm lesion right hepatic lobe; no additional visualized hepatic metastasis. Grossly unchanged wall  thickening of the rectum.  Continuation of Xeloda every other week and every three-week AvastinXeloda placed on hold 05/06/2016 due to hand-foot syndrome  Xeloda resumed 05/28/2016 1500 mg every morning and 1000 mg every afternoon 7 days on/7 days off  CT 09/27/2016-enlargement of lung nodules and new liver lesions, stable rectal mass  Cycle 1 FOLFIRI/panitumumab 10/08/2016  Cycle 2 panitumumab 10/23/2016 (FOLFIRI held secondary to neutropenia)  Cycle 3 FOLFIRI/PANITUMUMAB 11/04/2016  Cycle 4 FOLFIRI/panitumumab 11/18/2016  Cycle 5 FOLFIRI/panitumumab 12/16/2016  Cycle 6 FOLFIRI/panitumumab 12/31/2016  CTs 01/16/2017-decreased size of pulmonary nodules and liver lesions. Decreased perirectal lymph node  Cycle 7 FOLFIRI/PANITUMUMAB 01/28/2017  Cycle 8FOLFIRI/PANITUMUMAB 02/10/2017  Cycle 9 FOLFIRI/PANITUMUMAB 02/24/2017  Cycle 10 FOLFIRI/panitumumab 03/10/2017 (irinotecan held secondary to neutropenia)  Cycle 11 FOLFIRI/panitumumab 03/24/2017  Cycle12 FOLFIRI/Panitumumab 04/07/2017  Restaging CTs 04/25/2017-stable rectal mass with adjacent 8 mm left perirectal nodal metastasis. Prior hepatic metastases not visualized on the current study. Indeterminate 16 mm enhancing lesion present in segment 6, indeterminate. Minimal residual nodularity in the right upper lobe. No new/progressive pulmonary metastases.  Maintenance Xeloda/panitumumab 04/28/2017  Restaging CTs 08/18/2017-new and enlarging bilateral lung nodules; 2 hyperattenuating lesions in the liver appear similar to prior study; progression of wall thickening in the rectum with enlarging perirectal lymph nodes.  Initiation of radiation/Xeloda 09/01/2017, completed 09/22/2017  Cycle 1 FOLFIRI/Panitumumab 10/13/2017  Cycle 2 FOLFIRI 11/03/2017  Cycle 3 FOLFIRI 11/17/2017  Cycle 4 FOLFIRI 12/01/2017  Cycle 5 FOLFIRI 12/15/2017  CTs7/26/2019-mild progression lungs and liver; significantly improved rectal wall  thickening; resolution of perirectal nodal adenopathy.  Cycle 6 FOLFIRI 12/29/2017  Cycle 7 FOLFIRI 01/12/2018  Cycle 8 FOLFIRI 01/26/2018  Cycle 9 FOLFIRI 02/09/2018  Cycle 10 FOLFIRI 02/23/2018  Restaging CTs 03/06/2018-majority of the pulmonary nodules are similar in size, at least 2 of the nodules are minimally increased in size. Similar appearing lesions within the liver. Similar-appearing mild wall thickening of the rectum.  Cycle 11FOLFIRI 03/09/2018  Cycle 12 FOLFIRI 03/23/2018  Cycle 13 FOLFIRI 04/06/2018  Cycle 14 FOLFIRI 04/20/2018  Cycle 15 FOLFIRI 05/04/2018  CT 05/15/2018-no change in pulmonary nodules, mild increase in size of 2 liver lesions, stable rectal wall thickening  On study at [facility] [place] with palbociclib andUlixertinibbeginning 07/15/2018  [facility]  Restaging CTs 10/07/2018- enlargement of multiple lateral pulmonary nodules and liver lesions; redemonstrated circumferential thickening of the mid rectum with interval enlargement of an irregular nodule of the left perirectal fat.  Cycle 1 FOLFOX 10/12/2018  Cycle 2 FOLFOX 11/02/2018 (allergic reaction to oxaliplatin, oxaliplatin discontinued) 2.Multiple colon polyps on the colonoscopy 05/22/2015, tubular villous adenoma and a tubular adenoma were removed 3.Anemia-likely secondary to rectal bleeding.  4.Family history of multiple cancers including breast and prostate cancer 5.Transient right arm numbness 06/20/2015 6. Port-A-Cath placement 07/06/2015 by Dr. Marcello Moores 7. Mild neutropenia secondary to chemotherapy following cycle 1 FOLFOX. Neulasta added with cycle 2. 8. Delayed nausea following chemotherapy-emend added with cycle 3 FOLFOX;Emend added with cycle 12 FOLFIRI. 9. Early oxaliplatin neuropathy 10. 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 Xeloda resumed at a reduced dose. 11. Bilateral hand weakness. Question related to previous oxaliplatin.  Referred to neurology 06/17/2016. 12. Hypertension-amlodipine started 07/08/2016 13. Rash secondary to PANITUMUMAB.  14. Neutropenia following  cycle 1 FOLFIRI/PANITUMUMAB; neutropenia following cycle 4 FOLFIRI/PANITUMUMAB.     Disposition: Hunter Walker appears unchanged.  He had an allergic reaction to oxaliplatin during treatment on 11/02/2018.  We discussed the risk and benefit of continuing oxaliplatin.  I do not recommend rechallenge with oxaliplatin.  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  He progressed in 2018 while on maintenance Xeloda/Panitumumab.  His insurance company would not approve Panitumumab for repeat FOLFIRI/Panitumumab therapy.  I discussed treatment options with Mr. Blackshire.  We discussed [facility].  We discussed referral to Dr. Reynaldo Minium for a clinical trial in patients previously treated with an EGFR inhibitor.  He says he has no difficulty with travel.  I will refer him to Dr. Reynaldo Minium.  Mr. Condrey will return for an office visit in 2 weeks.  Betsy Coder, MD  11/16/2018  9:49 AM

## 2018-11-17 ENCOUNTER — Telehealth: Payer: Self-pay | Admitting: Oncology

## 2018-11-17 NOTE — Telephone Encounter (Signed)
Cancelled appts per los. Sent off referral.

## 2018-11-18 ENCOUNTER — Telehealth: Payer: Self-pay | Admitting: Oncology

## 2018-11-18 NOTE — Telephone Encounter (Signed)
Faxed records to Dr. Reynaldo Minium at [place]

## 2018-11-20 DIAGNOSIS — C259 Malignant neoplasm of pancreas, unspecified: Secondary | ICD-10-CM | POA: Diagnosis not present

## 2018-11-20 DIAGNOSIS — C787 Secondary malignant neoplasm of liver and intrahepatic bile duct: Secondary | ICD-10-CM | POA: Diagnosis not present

## 2018-11-20 DIAGNOSIS — C189 Malignant neoplasm of colon, unspecified: Secondary | ICD-10-CM | POA: Diagnosis not present

## 2018-11-24 ENCOUNTER — Other Ambulatory Visit: Payer: Self-pay | Admitting: Internal Medicine

## 2018-11-24 ENCOUNTER — Other Ambulatory Visit: Payer: Self-pay | Admitting: *Deleted

## 2018-11-24 DIAGNOSIS — Z20822 Contact with and (suspected) exposure to covid-19: Secondary | ICD-10-CM

## 2018-11-24 DIAGNOSIS — R6889 Other general symptoms and signs: Secondary | ICD-10-CM | POA: Diagnosis not present

## 2018-11-25 ENCOUNTER — Telehealth: Payer: Self-pay | Admitting: *Deleted

## 2018-11-25 DIAGNOSIS — C20 Malignant neoplasm of rectum: Secondary | ICD-10-CM

## 2018-11-25 MED ORDER — PANTOPRAZOLE SODIUM 40 MG PO TBEC: 40.0000 mg | DELAYED_RELEASE_TABLET | Freq: Every day | ORAL | 2 refills | Status: DC

## 2018-11-25 NOTE — Telephone Encounter (Signed)
Needs refill on Protonix and asking to reschedule his 11/30/18 visit with Dr. Benay Spice to later, since he is seeing Dr. Reynaldo Minium at [facility] the same day at 2pm.

## 2018-11-25 NOTE — Telephone Encounter (Signed)
Left VM that Dr. Benay Spice said to schedule him on 12/09/18 at 0830. Also sent Mychart message.

## 2018-11-27 LAB — NOVEL CORONAVIRUS, NAA: SARS-CoV-2, NAA: NOT DETECTED

## 2018-11-30 ENCOUNTER — Other Ambulatory Visit: Payer: Medicare HMO

## 2018-11-30 ENCOUNTER — Ambulatory Visit: Payer: Medicare HMO | Admitting: Oncology

## 2018-11-30 ENCOUNTER — Ambulatory Visit: Payer: Medicare HMO

## 2018-11-30 DIAGNOSIS — C20 Malignant neoplasm of rectum: Secondary | ICD-10-CM | POA: Diagnosis not present

## 2018-12-09 ENCOUNTER — Other Ambulatory Visit: Payer: Self-pay

## 2018-12-09 ENCOUNTER — Telehealth: Payer: Self-pay | Admitting: Oncology

## 2018-12-09 ENCOUNTER — Inpatient Hospital Stay: Payer: Medicare HMO | Attending: Nurse Practitioner | Admitting: Oncology

## 2018-12-09 VITALS — BP 125/81 | HR 78 | Temp 98.2°F | Resp 18 | Ht 73.0 in | Wt 183.4 lb

## 2018-12-09 DIAGNOSIS — Z8042 Family history of malignant neoplasm of prostate: Secondary | ICD-10-CM | POA: Insufficient documentation

## 2018-12-09 DIAGNOSIS — G620 Drug-induced polyneuropathy: Secondary | ICD-10-CM | POA: Insufficient documentation

## 2018-12-09 DIAGNOSIS — C20 Malignant neoplasm of rectum: Secondary | ICD-10-CM | POA: Diagnosis not present

## 2018-12-09 DIAGNOSIS — Z803 Family history of malignant neoplasm of breast: Secondary | ICD-10-CM

## 2018-12-09 DIAGNOSIS — C787 Secondary malignant neoplasm of liver and intrahepatic bile duct: Secondary | ICD-10-CM

## 2018-12-09 NOTE — Telephone Encounter (Signed)
Called and spoke with patient. Confirmed appt  °

## 2018-12-09 NOTE — Progress Notes (Signed)
1.[facility] [place] OFFICE PROGRESS NOTE   Diagnosis: Rectal cancer  INTERVAL HISTORY:   Hunter Walker returns for a scheduled visit.  He is now maintained off of specific therapy for rectal cancer.  He saw Dr. Reynaldo Minium on 11/30/2018.  He is planning to enroll on the [place] study.  He is waiting on results from guardant testing.  Hunter Walker feels well.  Good appetite.  His bowels are moving daily.  No rectal pain or bleeding.  Stable peripheral neuropathy symptoms.  No dyspnea.  He and his wife had COVID testing at their church.  He reports they were both negative.  Objective:  Vital signs in last 24 hours:  Blood pressure 125/81, pulse 78, temperature 98.2 F (36.8 C), temperature source Oral, resp. rate 18, height '6\' 1"'  (1.854 m), weight 183 lb 6.4 oz (83.2 kg), SpO2 99 %.    Physical examination not performed today secondary to distancing with the COVID pandemic  Lab Results:  Lab Results  Component Value Date   WBC 9.1 11/16/2018   HGB 12.7 (L) 11/16/2018   HCT 39.8 11/16/2018   MCV 102.3 (H) 11/16/2018   PLT 219 11/16/2018   NEUTROABS 6.7 11/16/2018    CMP  Lab Results  Component Value Date   NA 138 11/16/2018   K 3.9 11/16/2018   CL 107 11/16/2018   CO2 22 11/16/2018   GLUCOSE 133 (H) 11/16/2018   BUN 13 11/16/2018   CREATININE 1.25 (H) 11/16/2018   CALCIUM 8.9 11/16/2018   PROT 7.9 11/16/2018   ALBUMIN 3.4 (L) 11/16/2018   AST 17 11/16/2018   ALT 15 11/16/2018   ALKPHOS 121 11/16/2018   BILITOT 0.4 11/16/2018   GFRNONAA 60 (L) 11/16/2018   GFRAA >60 11/16/2018    Lab Results  Component Value Date   CEA1 106.30 (H) 10/07/2018     Medications: I have reviewed the patient's current medications.   Assessment/Plan: 1.Rectal cancer, invasive adenocarcinoma confirmed at colonoscopy 05/22/2015, EUS staging 06/01/2015 revealed a uT3,N1 lesion at 7 cm from the anal verge  Staging CT abdomen/pelvis 05/15/2015 confirmed a mass at the  rectosigmoid colon, a suspicious perirectal lymph node, and a 6 mm right liver lesion felt to most likely be a cyst  CT chest 05/30/2015 with indeterminate pulmonary nodules and multiple liver lesions suspicious for metastases  Ultrasound 06/13/2014 with no liver lesions identified  MRI abdomen 06/21/2015 consistent with multiple liver metastases  CT biopsy of liver lesion 06/26/2015. Pathology showed metastatic adenocarcinoma consistent with colonic primary.MSI-stable, low mutation burden, no RAS or BRAFmutation  Cycle 1 FOLFOX 07/10/2015  Cycle 2 FOLFOX plus Avastin 07/24/2015 with Neulasta support  Cycle 3 FOLFOX plus Avastin 08/07/2015  Cycle 4 FOLFOX plus Avastin 08/21/2015  cycle 5 FOLFOX plus Avastin 09/04/2015  Restaging CTs 09/15/2015 revealed a decrease in the size of liver lesions, decreased rectal primary, stable/decreased size of tiny lung nodules  Cycle 6 FOLFOX plus Avastin 09/18/2015  Cycle 7 FOLFOX plus Avastin 10/02/2015  Cycle 8 FOLFOX plus Avastin 10/16/2015  Cycle 9 FOLFOX plus Avastin 11/06/2015  Cycle 10 FOLFOX plus Avastin 11/20/2015  CTs 12/07/2015-mild decrease in wall thickening at the rectum, decrease in tiny hepatic metastases, stable lung nodules, no evidence of progressive metastatic disease, new left lower lobe infiltrate  Treatment changed to every three-week Avastin with every other week Xeloda beginning 12/11/2015  Restaging CTs 04/12/2016-interval increase in size of adjacent right upper lobe pulmonary nodules and right lower lobe pulmonary nodule; unchanged 5 mm lesion  right hepatic lobe; no additional visualized hepatic metastasis. Grossly unchanged wall thickening of the rectum.  Continuation of Xeloda every other week and every three-week AvastinXeloda placed on hold 05/06/2016 due to hand-foot syndrome  Xeloda resumed 05/28/2016 1500 mg every morning and 1000 mg every afternoon 7 days on/7 days off  CT 09/27/2016-enlargement of  lung nodules and new liver lesions, stable rectal mass  Cycle 1 FOLFIRI/panitumumab 10/08/2016  Cycle 2 panitumumab 10/23/2016 (FOLFIRI held secondary to neutropenia)  Cycle 3 FOLFIRI/PANITUMUMAB 11/04/2016  Cycle 4 FOLFIRI/panitumumab 11/18/2016  Cycle 5 FOLFIRI/panitumumab 12/16/2016  Cycle 6 FOLFIRI/panitumumab 12/31/2016  CTs 01/16/2017-decreased size of pulmonary nodules and liver lesions. Decreased perirectal lymph node  Cycle 7 FOLFIRI/PANITUMUMAB 01/28/2017  Cycle 8FOLFIRI/PANITUMUMAB 02/10/2017  Cycle 9 FOLFIRI/PANITUMUMAB 02/24/2017  Cycle 10 FOLFIRI/panitumumab 03/10/2017 (irinotecan held secondary to neutropenia)  Cycle 11 FOLFIRI/panitumumab 03/24/2017  Cycle12 FOLFIRI/Panitumumab 04/07/2017  Restaging CTs 04/25/2017-stable rectal mass with adjacent 8 mm left perirectal nodal metastasis. Prior hepatic metastases not visualized on the current study. Indeterminate 16 mm enhancing lesion present in segment 6, indeterminate. Minimal residual nodularity in the right upper lobe. No new/progressive pulmonary metastases.  Maintenance Xeloda/panitumumab 04/28/2017  Restaging CTs 08/18/2017-new and enlarging bilateral lung nodules; 2 hyperattenuating lesions in the liver appear similar to prior study; progression of wall thickening in the rectum with enlarging perirectal lymph nodes.  Initiation of radiation/Xeloda 09/01/2017, completed 09/22/2017  Cycle 1 FOLFIRI/Panitumumab 10/13/2017  Cycle 2 FOLFIRI 11/03/2017  Cycle 3 FOLFIRI 11/17/2017  Cycle 4 FOLFIRI 12/01/2017  Cycle 5 FOLFIRI 12/15/2017  CTs7/26/2019-mild progression lungs and liver; significantly improved rectal wall thickening; resolution of perirectal nodal adenopathy.  Cycle 6 FOLFIRI 12/29/2017  Cycle 7 FOLFIRI 01/12/2018  Cycle 8 FOLFIRI 01/26/2018  Cycle 9 FOLFIRI 02/09/2018  Cycle 10 FOLFIRI 02/23/2018  Restaging CTs 03/06/2018-majority of the pulmonary nodules are similar in size, at least 2 of  the nodules are minimally increased in size. Similar appearing lesions within the liver. Similar-appearing mild wall thickening of the rectum.  Cycle 11FOLFIRI 03/09/2018  Cycle 12 FOLFIRI 03/23/2018  Cycle 13 FOLFIRI 04/06/2018  Cycle 14 FOLFIRI 04/20/2018  Cycle 15 FOLFIRI 05/04/2018  CT 05/15/2018-no change in pulmonary nodules, mild increase in size of 2 liver lesions, stable rectal wall thickening  On study at [facility] [place] with palbociclib andUlixertinibbeginning 07/15/2018  [facility]  Restaging CTs 10/07/2018- enlargement of multiple lateral pulmonary nodules and liver lesions; redemonstrated circumferential thickening of the mid rectum with interval enlargement of an irregular nodule of the left perirectal fat.  Cycle 1 FOLFOX 10/12/2018  Cycle 2 FOLFOX 11/02/2018 (allergic reaction to oxaliplatin, oxaliplatin discontinued) 2.Multiple colon polyps on the colonoscopy 05/22/2015, tubular villous adenoma and a tubular adenoma were removed 3.Anemia-likely secondary to rectal bleeding.  4.Family history of multiple cancers including breast and prostate cancer 5.Transient right arm numbness 06/20/2015 6. Port-A-Cath placement 07/06/2015 by Dr. Marcello Moores 7. Mild neutropenia secondary to chemotherapy following cycle 1 FOLFOX. Neulasta added with cycle 2. 8. Delayed nausea following chemotherapy-emend added with cycle 3 FOLFOX;Emend added with cycle 12 FOLFIRI. 9. Early oxaliplatin neuropathy 10. 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 Xeloda resumed at a reduced dose. 11. Bilateral hand weakness. Question related to previous oxaliplatin. Referred to neurology 06/17/2016. 12. Hypertension-amlodipine started 07/08/2016 13. Rash secondary to PANITUMUMAB.  14. Neutropenia following cycle 1 FOLFIRI/PANITUMUMAB; neutropenia following cycle 4 FOLFIRI/PANITUMUMAB.    Disposition: [name] appears well.  He is waiting on  results of Guardant testing with the plan to enroll on the [place] study  at [facility].  He hopes to hear from the [place] study coordinator this week.  He will return for an office visit here in 2-3 weeks.  We are available to see him while on study as needed.  We discussed standard treatment options including TAS-102.  Betsy Coder, MD  12/09/2018  9:12 AM

## 2018-12-11 ENCOUNTER — Encounter: Payer: Self-pay | Admitting: *Deleted

## 2018-12-11 NOTE — Addendum Note (Signed)
Addended by: Brigitte Pulse on: 12/11/2018 10:57 AM   Modules accepted: Orders

## 2018-12-11 NOTE — Progress Notes (Signed)
Left VM with triage line at [facility] [phone]) requesting results be faxed to Dr. Benay Spice at [phone].

## 2018-12-20 ENCOUNTER — Encounter (HOSPITAL_COMMUNITY): Payer: Self-pay

## 2018-12-20 ENCOUNTER — Other Ambulatory Visit: Payer: Self-pay

## 2018-12-20 ENCOUNTER — Emergency Department (HOSPITAL_COMMUNITY)
Admission: EM | Admit: 2018-12-20 | Discharge: 2018-12-20 | Disposition: A | Discharge status: Home / Self Care | Payer: Medicare HMO | Attending: Emergency Medicine | Admitting: Emergency Medicine

## 2018-12-20 DIAGNOSIS — Z79899 Other long term (current) drug therapy: Secondary | ICD-10-CM | POA: Insufficient documentation

## 2018-12-20 DIAGNOSIS — T7840XA Allergy, unspecified, initial encounter: Secondary | ICD-10-CM | POA: Diagnosis not present

## 2018-12-20 DIAGNOSIS — Z87891 Personal history of nicotine dependence: Secondary | ICD-10-CM | POA: Diagnosis not present

## 2018-12-20 DIAGNOSIS — I10 Essential (primary) hypertension: Secondary | ICD-10-CM | POA: Diagnosis not present

## 2018-12-20 MED ORDER — FAMOTIDINE 20 MG PO TABS: 20.0000 mg | ORAL_TABLET | Freq: Two times a day (BID) | ORAL | 0 refills | Status: AC

## 2018-12-20 MED ORDER — FAMOTIDINE IN NACL 20-0.9 MG/50ML-% IV SOLN
20.0000 mg | Freq: Once | INTRAVENOUS | Status: AC
  Administered 2018-12-20: 20 mg via INTRAVENOUS
  Filled 2018-12-20: qty 50

## 2018-12-20 MED ORDER — METHYLPREDNISOLONE SODIUM SUCC 125 MG IJ SOLR
125.0000 mg | Freq: Once | INTRAMUSCULAR | Status: AC
  Administered 2018-12-20: 125 mg via INTRAVENOUS
  Filled 2018-12-20: qty 2

## 2018-12-20 MED ORDER — DIPHENHYDRAMINE HCL 50 MG/ML IJ SOLN
25.0000 mg | Freq: Once | INTRAMUSCULAR | Status: AC
  Administered 2018-12-20: 25 mg via INTRAVENOUS
  Filled 2018-12-20: qty 1

## 2018-12-20 NOTE — Discharge Instructions (Addendum)
Take Benadryl 25 mg every 6 hours if needed for itching and rash.  Follow-up with your doctors if needed

## 2018-12-20 NOTE — ED Triage Notes (Signed)
He c/o widespread pruritic rash, plus upper lip swelling since yesterday evening. He tells me he "had a reaction like this the last time I got chemo. Which was three weeks ago". He is in no distress. He denies any tongue or throat swelling and is having no trouble breathing.

## 2018-12-20 NOTE — ED Provider Notes (Signed)
[place] DEPT Provider Note   CSN: [id number] Arrival date & time: 12/20/18  0820     History   Chief Complaint Chief Complaint  Patient presents with  . Allergic Reaction    HPI Hunter Walker is a 68 y.o. male.     Patient complains of rash and itching mostly on his back.  He has had problems with allergies before  The history is provided by the patient. No language interpreter was used.  Allergic Reaction Presenting symptoms: rash   Presenting symptoms: no difficulty breathing   Severity:  Mild Prior allergic episodes:  No prior episodes Context: not animal exposure   Relieved by:  Nothing Worsened by:  Nothing Ineffective treatments:  Antihistamines   Past Medical History:  Diagnosis Date  . Allergic rhinitis   . Cancer [facility])    rectal cancer  . GERD (gastroesophageal reflux disease)   . Hemorrhoids     Patient Active Problem List   Diagnosis Date Noted  . Chemotherapy-induced neuropathy ([place]) 10/22/2018  . Port-A-Cath in place 04/20/2018  . Essential hypertension 03/20/2017  . Encounter for antineoplastic chemotherapy 02/10/2017  . Goals of care, counseling/discussion 09/30/2016  . Routine general medical examination at a health care facility 06/25/2016  . Port catheter in place 09/29/2015  . GERD (gastroesophageal reflux disease) 06/29/2015  . Rectal cancer ([place]) - adenocarcinoma 06/15/2015    Past Surgical History:  Procedure Laterality Date  . EUS N/A 06/01/2015   Procedure: LOWER ENDOSCOPIC ULTRASOUND (EUS);  Surgeon: Milus Banister, MD;  Location: [facility];  Service: Endoscopy;  Laterality: N/A;  . LIVER BIOPSY  06/26/2015  . NO PAST SURGERIES    . PORTACATH PLACEMENT Right 07/06/2015   Procedure: INSERTION PORT-A-CATH;  Surgeon: Leighton Ruff, MD;  Location: [facility] ORS;  Service: General;  Laterality: Right;        Home Medications    Prior to Admission medications   Medication Sig Start Date End  Date Taking? Authorizing Provider  amLODipine (NORVASC) 5 MG tablet Take 1 tablet (5 mg total) by mouth daily. 04/21/18  Yes Owens Shark, NP  bisacodyl ([employer] GENTLE LAXATIVE) 5 MG EC tablet Take 1 tablet (5 mg total) by mouth daily as needed (constipation). 04/06/18  Yes Ladell Pier, MD  ferrous sulfate 325 (65 FE) MG tablet Take 325 mg by mouth daily with breakfast.    Yes [provider]  gabapentin (NEURONTIN) 300 MG capsule Take 1 capsule (300 mg total) by mouth 3 (three) times daily. 10/22/18  Yes [name], MD  ondansetron (ZOFRAN) 8 MG tablet Take 1 tablet (8 mg total) by mouth every 8 (eight) hours as needed for nausea or vomiting. 05/04/18  Yes Owens Shark, NP  pantoprazole (PROTONIX) 40 MG tablet Take 1 tablet (40 mg total) by mouth daily. 11/25/18  Yes Ladell Pier, MD  polyethylene glycol (MIRALAX / GLYCOLAX) packet Take 17 g by mouth daily as needed.    Yes [provider]  prochlorperazine (COMPAZINE) 10 MG tablet TAKE 1 TABLET (10 MG TOTAL) BY MOUTH EVERY 6 (SIX) HOURS AS NEEDED FOR NAUSEA OR VOMITING. Patient taking differently: Take 10 mg by mouth every 6 (six) hours as needed for nausea or vomiting. Takes after chemo 02/23/18  Yes Owens Shark, NP  famotidine (PEPCID) 20 MG tablet Take 1 tablet (20 mg total) by mouth 2 (two) times daily. 12/20/18   Milton Ferguson, MD    Family History Family History  Problem  Relation Age of Onset  . Breast cancer Sister        x 2  . Diabetes Mother   . Kidney disease Mother   . Prostate cancer Father   . Diabetes Father   . Prostate cancer Brother   . Diabetes Sister   . Heart disease Brother   . Colon cancer Neg Hx     Social History Social History   Tobacco Use  . Smoking status: Former Smoker    Packs/day: 1.00    Years: 10.00    Pack years: 10.00    Types: Cigarettes    Quit date: 06/03/1978    Years since quitting: 40.5  . Smokeless tobacco: Never Used  Substance Use Topics  .  Alcohol use: Yes    Alcohol/week: 0.0 standard drinks    Comment: occ  . Drug use: No     Allergies   Oxaliplatin   Review of Systems Review of Systems  Constitutional: Negative for appetite change and fatigue.  HENT: Negative for congestion, ear discharge and sinus pressure.   Eyes: Negative for discharge.  Respiratory: Negative for cough.   Cardiovascular: Negative for chest pain.  Gastrointestinal: Negative for abdominal pain and diarrhea.  Genitourinary: Negative for frequency and hematuria.  Musculoskeletal: Negative for back pain.  Skin: Positive for rash.  Neurological: Negative for seizures and headaches.  Psychiatric/Behavioral: Negative for hallucinations.     Physical Exam Updated Vital Signs BP 112/84   Pulse 78   Temp 99.6 F (37.6 C) (Oral)   Resp 16   SpO2 97%   Physical Exam Vitals signs and nursing note reviewed.  Constitutional:      Appearance: He is well-developed.  HENT:     Head: Normocephalic.     Nose: Nose normal.  Eyes:     General: No scleral icterus.    Conjunctiva/sclera: Conjunctivae normal.  Neck:     Musculoskeletal: Neck supple.     Thyroid: No thyromegaly.  Cardiovascular:     Rate and Rhythm: Normal rate and regular rhythm.     Heart sounds: No murmur. No friction rub. No gallop.   Pulmonary:     Breath sounds: No stridor. No wheezing or rales.  Chest:     Chest wall: No tenderness.  Abdominal:     General: There is no distension.     Tenderness: There is no abdominal tenderness. There is no rebound.  Musculoskeletal: Normal range of motion.  Lymphadenopathy:     Cervical: No cervical adenopathy.  Skin:    Findings: Rash present. No erythema.  Neurological:     Mental Status: He is alert and oriented to person, place, and time.     Motor: No abnormal muscle tone.     Coordination: Coordination normal.  Psychiatric:        Behavior: Behavior normal.      ED Treatments / Results  Labs (all labs ordered are  listed, but only abnormal results are displayed) Labs Reviewed - No data to display  EKG None  Radiology No results found.  Procedures Procedures (including critical care time)  Medications Ordered in ED Medications  diphenhydrAMINE (BENADRYL) injection 25 mg (25 mg Intravenous Given 12/20/18 0946)  famotidine (PEPCID) IVPB 20 mg premix (0 mg Intravenous Stopped 12/20/18 1054)  methylPREDNISolone sodium succinate (SOLU-MEDROL) 125 mg/2 mL injection 125 mg (125 mg Intravenous Given 12/20/18 0945)     Initial Impression / Assessment and Plan / ED Course  I have reviewed the triage vital signs  and the nursing notes.  Pertinent labs & imaging results that were available during my care of the patient were reviewed by me and considered in my medical decision making (see chart for details).        With allergic reaction.  Patient improved with Benadryl and Pepcid.  Patient will follow-up with his doctor  Final Clinical Impressions(s) / ED Diagnoses   Final diagnoses:  Allergic reaction, initial encounter    ED Discharge Orders         Ordered    famotidine (PEPCID) 20 MG tablet  2 times daily     12/20/18 1210           Milton Ferguson, MD 12/20/18 1219

## 2018-12-28 ENCOUNTER — Telehealth: Payer: Self-pay | Admitting: *Deleted

## 2018-12-28 ENCOUNTER — Telehealth: Payer: Self-pay | Admitting: Nurse Practitioner

## 2018-12-28 ENCOUNTER — Inpatient Hospital Stay: Payer: Medicare HMO | Admitting: Nurse Practitioner

## 2018-12-28 NOTE — Telephone Encounter (Signed)
Scheduled appt per 7/27 sch message - pt aware of appt date and time   

## 2018-12-28 NOTE — Telephone Encounter (Signed)
Called to cancel his visit today. Has been having some abdominal discomfort and feeling dizzy (started 7/26). Has mild nausea, but no vomiting. Able to eat and drink fluids without difficulty. Has not had a good BM in few days--plans to resume MiraLax today and take Dulcolax tabs as well. No fever or abdominal swelling. 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 Unable to come in later in the week, due to wife having eye surgery. He would like to reschedule for next week. He agrees to call if his condition worsens. Scheduling message sent and MD notified.

## 2019-01-05 ENCOUNTER — Inpatient Hospital Stay: Payer: Medicare HMO

## 2019-01-05 ENCOUNTER — Inpatient Hospital Stay: Payer: Medicare HMO | Attending: Nurse Practitioner | Admitting: Nurse Practitioner

## 2019-01-05 ENCOUNTER — Encounter: Payer: Self-pay | Admitting: Nurse Practitioner

## 2019-01-05 ENCOUNTER — Other Ambulatory Visit: Payer: Self-pay

## 2019-01-05 VITALS — BP 129/84 | HR 80 | Temp 98.0°F | Resp 18 | Ht 73.0 in | Wt 181.7 lb

## 2019-01-05 DIAGNOSIS — Z79899 Other long term (current) drug therapy: Secondary | ICD-10-CM | POA: Diagnosis not present

## 2019-01-05 DIAGNOSIS — Z803 Family history of malignant neoplasm of breast: Secondary | ICD-10-CM | POA: Insufficient documentation

## 2019-01-05 DIAGNOSIS — Z8042 Family history of malignant neoplasm of prostate: Secondary | ICD-10-CM | POA: Diagnosis not present

## 2019-01-05 DIAGNOSIS — Z95828 Presence of other vascular implants and grafts: Secondary | ICD-10-CM | POA: Diagnosis not present

## 2019-01-05 DIAGNOSIS — D500 Iron deficiency anemia secondary to blood loss (chronic): Secondary | ICD-10-CM | POA: Diagnosis not present

## 2019-01-05 DIAGNOSIS — C20 Malignant neoplasm of rectum: Secondary | ICD-10-CM | POA: Insufficient documentation

## 2019-01-05 DIAGNOSIS — I10 Essential (primary) hypertension: Secondary | ICD-10-CM | POA: Diagnosis not present

## 2019-01-05 DIAGNOSIS — D701 Agranulocytosis secondary to cancer chemotherapy: Secondary | ICD-10-CM | POA: Diagnosis not present

## 2019-01-05 DIAGNOSIS — G620 Drug-induced polyneuropathy: Secondary | ICD-10-CM | POA: Insufficient documentation

## 2019-01-05 DIAGNOSIS — Z86010 Personal history of colonic polyps: Secondary | ICD-10-CM | POA: Diagnosis not present

## 2019-01-05 DIAGNOSIS — L271 Localized skin eruption due to drugs and medicaments taken internally: Secondary | ICD-10-CM | POA: Insufficient documentation

## 2019-01-05 DIAGNOSIS — K625 Hemorrhage of anus and rectum: Secondary | ICD-10-CM | POA: Diagnosis not present

## 2019-01-05 DIAGNOSIS — C787 Secondary malignant neoplasm of liver and intrahepatic bile duct: Secondary | ICD-10-CM | POA: Insufficient documentation

## 2019-01-05 DIAGNOSIS — R21 Rash and other nonspecific skin eruption: Secondary | ICD-10-CM | POA: Diagnosis not present

## 2019-01-05 DIAGNOSIS — T451X5A Adverse effect of antineoplastic and immunosuppressive drugs, initial encounter: Secondary | ICD-10-CM | POA: Insufficient documentation

## 2019-01-05 DIAGNOSIS — Z8719 Personal history of other diseases of the digestive system: Secondary | ICD-10-CM | POA: Insufficient documentation

## 2019-01-05 DIAGNOSIS — R110 Nausea: Secondary | ICD-10-CM | POA: Insufficient documentation

## 2019-01-05 DIAGNOSIS — R531 Weakness: Secondary | ICD-10-CM | POA: Insufficient documentation

## 2019-01-05 LAB — CMP (CANCER CENTER ONLY)
ALT: 13 U/L (ref 0–44)
AST: 23 U/L (ref 15–41)
Albumin: 3.6 g/dL (ref 3.5–5.0)
Alkaline Phosphatase: 110 U/L (ref 38–126)
Anion gap: 8 (ref 5–15)
BUN: 17 mg/dL (ref 8–23)
CO2: 23 mmol/L (ref 22–32)
Calcium: 9.1 mg/dL (ref 8.9–10.3)
Chloride: 109 mmol/L (ref 98–111)
Creatinine: 1.17 mg/dL (ref 0.61–1.24)
GFR, Est AFR Am: >60 mL/min (ref >60)
GFR, Estimated: >60 mL/min (ref >60)
Glucose, Bld: 100 mg/dL — ABNORMAL HIGH (ref 70–99)
Potassium: 4.2 mmol/L (ref 3.5–5.1)
Sodium: 140 mmol/L (ref 135–145)
Total Bilirubin: 0.4 mg/dL (ref 0.3–1.2)
Total Protein: 8 g/dL (ref 6.5–8.1)

## 2019-01-05 LAB — CBC WITH DIFFERENTIAL (CANCER CENTER ONLY)
Abs Immature Granulocytes: 0.01 10*3/uL (ref 0.00–0.07)
Basophils Absolute: 0 10*3/uL (ref 0.0–0.1)
Basophils Relative: 1 %
Eosinophils Absolute: 0.1 10*3/uL (ref 0.0–0.5)
Eosinophils Relative: 2 %
HCT: 43.9 % (ref 39.0–52.0)
Hemoglobin: 14.1 g/dL (ref 13.0–17.0)
Immature Granulocytes: 0 %
Lymphocytes Relative: 24 %
Lymphs Abs: 1.6 10*3/uL (ref 0.7–4.0)
MCH: 31.1 pg (ref 26.0–34.0)
MCHC: 32.1 g/dL (ref 30.0–36.0)
MCV: 96.9 fL (ref 80.0–100.0)
Monocytes Absolute: 0.4 10*3/uL (ref 0.1–1.0)
Monocytes Relative: 6 %
Neutro Abs: 4.5 10*3/uL (ref 1.7–7.7)
Neutrophils Relative %: 67 %
Platelet Count: 217 10*3/uL (ref 150–400)
RBC: 4.53 MIL/uL (ref 4.22–5.81)
RDW: 14.4 % (ref 11.5–15.5)
WBC Count: 6.6 10*3/uL (ref 4.0–10.5)
nRBC: 0 % (ref 0.0–0.2)

## 2019-01-05 LAB — CEA (IN HOUSE-CHCC): CEA (CHCC-In House): 292.3 ng/mL — ABNORMAL HIGH (ref 0.00–5.00)

## 2019-01-05 MED ORDER — SODIUM CHLORIDE 0.9% FLUSH
10.0000 mL | Freq: Once | INTRAVENOUS | Status: AC
  Administered 2019-01-05: 10 mL
  Filled 2019-01-05: qty 10

## 2019-01-05 MED ORDER — HEPARIN SOD (PORK) LOCK FLUSH 100 UNIT/ML IV SOLN
500.0000 [IU] | Freq: Once | INTRAVENOUS | Status: AC
  Administered 2019-01-05: 500 [IU]
  Filled 2019-01-05: qty 5

## 2019-01-05 MED ORDER — HYDROCODONE-ACETAMINOPHEN 5-325 MG PO TABS: 1.0000 | ORAL_TABLET | Freq: Four times a day (QID) | ORAL | 0 refills | Status: DC | PRN

## 2019-01-05 NOTE — Progress Notes (Addendum)
[place] OFFICE PROGRESS NOTE   Diagnosis: Rectal cancer  INTERVAL HISTORY:   Hunter Walker returns for follow-up.  He reports intermittent rectal pain and bleeding.  The bleeding is very mild.  Bowels are moving.  No hematuria or dysuria.  No fever, cough, shortness of breath.  Appetite varies.  Objective:  Vital signs in last 24 hours:  Blood pressure 129/84, pulse 80, temperature 98 F (36.7 C), temperature source Oral, resp. rate 18, height '6\' 1"'  (1.854 m), weight 181 lb 11.2 oz (82.4 kg), SpO2 100 %.    HEENT: No thrush or ulcers. GI: Abdomen mildly distended.  No hepatomegaly.  No apparent ascites. Vascular: No leg edema. Neuro: Alert and oriented. Skin: No rash. Port-A-Cath without erythema.   Lab Results:  Lab Results  Component Value Date   WBC 9.1 11/16/2018   HGB 12.7 (L) 11/16/2018   HCT 39.8 11/16/2018   MCV 102.3 (H) 11/16/2018   PLT 219 11/16/2018   NEUTROABS 6.7 11/16/2018    Imaging:  No results found.  Medications: I have reviewed the patient's current medications.  Assessment/Plan: 1.Rectal cancer, invasive adenocarcinoma confirmed at colonoscopy 05/22/2015, EUS staging 06/01/2015 revealed a uT3,N1 lesion at 7 cm from the anal verge  Staging CT abdomen/pelvis 05/15/2015 confirmed a mass at the rectosigmoid colon, a suspicious perirectal lymph node, and a 6 mm right liver lesion felt to most likely be a cyst  CT chest 05/30/2015 with indeterminate pulmonary nodules and multiple liver lesions suspicious for metastases  Ultrasound 06/13/2014 with no liver lesions identified  MRI abdomen 06/21/2015 consistent with multiple liver metastases  CT biopsy of liver lesion 06/26/2015. Pathology showed metastatic adenocarcinoma consistent with colonic primary.MSI-stable, low mutation burden, no RAS or BRAFmutation  Cycle 1 FOLFOX 07/10/2015  Cycle 2 FOLFOX plus Avastin 07/24/2015 with Neulasta support  Cycle 3 FOLFOX plus  Avastin 08/07/2015  Cycle 4 FOLFOX plus Avastin 08/21/2015  cycle 5 FOLFOX plus Avastin 09/04/2015  Restaging CTs 09/15/2015 revealed a decrease in the size of liver lesions, decreased rectal primary, stable/decreased size of tiny lung nodules  Cycle 6 FOLFOX plus Avastin 09/18/2015  Cycle 7 FOLFOX plus Avastin 10/02/2015  Cycle 8 FOLFOX plus Avastin 10/16/2015  Cycle 9 FOLFOX plus Avastin 11/06/2015  Cycle 10 FOLFOX plus Avastin 11/20/2015  CTs 12/07/2015-mild decrease in wall thickening at the rectum, decrease in tiny hepatic metastases, stable lung nodules, no evidence of progressive metastatic disease, new left lower lobe infiltrate  Treatment changed to every three-week Avastin with every other week Xeloda beginning 12/11/2015  Restaging CTs 04/12/2016-interval increase in size of adjacent right upper lobe pulmonary nodules and right lower lobe pulmonary nodule; unchanged 5 mm lesion right hepatic lobe; no additional visualized hepatic metastasis. Grossly unchanged wall thickening of the rectum.  Continuation of Xeloda every other week and every three-week AvastinXeloda placed on hold 05/06/2016 due to hand-foot syndrome  Xeloda resumed 05/28/2016 1500 mg every morning and 1000 mg every afternoon 7 days on/7 days off  CT 09/27/2016-enlargement of lung nodules and new liver lesions, stable rectal mass  Cycle 1 FOLFIRI/panitumumab 10/08/2016  Cycle 2 panitumumab 10/23/2016 (FOLFIRI held secondary to neutropenia)  Cycle 3 FOLFIRI/PANITUMUMAB 11/04/2016  Cycle 4 FOLFIRI/panitumumab 11/18/2016  Cycle 5 FOLFIRI/panitumumab 12/16/2016  Cycle 6 FOLFIRI/panitumumab 12/31/2016  CTs 01/16/2017-decreased size of pulmonary nodules and liver lesions. Decreased perirectal lymph node  Cycle 7 FOLFIRI/PANITUMUMAB 01/28/2017  Cycle 8FOLFIRI/PANITUMUMAB 02/10/2017  Cycle 9 FOLFIRI/PANITUMUMAB 02/24/2017  Cycle 10 FOLFIRI/panitumumab 03/10/2017 (irinotecan held secondary to  neutropenia)  Cycle  11 FOLFIRI/panitumumab 03/24/2017  Cycle12 FOLFIRI/Panitumumab 04/07/2017  Restaging CTs 04/25/2017-stable rectal mass with adjacent 8 mm left perirectal nodal metastasis. Prior hepatic metastases not visualized on the current study. Indeterminate 16 mm enhancing lesion present in segment 6, indeterminate. Minimal residual nodularity in the right upper lobe. No new/progressive pulmonary metastases.  Maintenance Xeloda/panitumumab 04/28/2017  Restaging CTs 08/18/2017-new and enlarging bilateral lung nodules; 2 hyperattenuating lesions in the liver appear similar to prior study; progression of wall thickening in the rectum with enlarging perirectal lymph nodes.  Initiation of radiation/Xeloda 09/01/2017, completed 09/22/2017  Cycle 1 FOLFIRI/Panitumumab 10/13/2017  Cycle 2 FOLFIRI 11/03/2017  Cycle 3 FOLFIRI 11/17/2017  Cycle 4 FOLFIRI 12/01/2017  Cycle 5 FOLFIRI 12/15/2017  CTs7/26/2019-mild progression lungs and liver; significantly improved rectal wall thickening; resolution of perirectal nodal adenopathy.  Cycle 6 FOLFIRI 12/29/2017  Cycle 7 FOLFIRI 01/12/2018  Cycle 8 FOLFIRI 01/26/2018  Cycle 9 FOLFIRI 02/09/2018  Cycle 10 FOLFIRI 02/23/2018  Restaging CTs 03/06/2018-majority of the pulmonary nodules are similar in size, at least 2 of the nodules are minimally increased in size. Similar appearing lesions within the liver. Similar-appearing mild wall thickening of the rectum.  Cycle 11FOLFIRI 03/09/2018  Cycle 12 FOLFIRI 03/23/2018  Cycle 13 FOLFIRI 04/06/2018  Cycle 14 FOLFIRI 04/20/2018  Cycle 15 FOLFIRI 05/04/2018  CT 05/15/2018-no change in pulmonary nodules, mild increase in size of 2 liver lesions, stable rectal wall thickening  On study at [facility] [place] with palbociclib andUlixertinibbeginning 07/15/2018  [facility]  Restaging CTs 10/07/2018- enlargement of multiple lateral pulmonary nodules and liver lesions;  redemonstrated circumferential thickening of the mid rectum with interval enlargement of an irregular nodule of the left perirectal fat.  Cycle 1 FOLFOX 10/12/2018  Cycle 2 FOLFOX 11/02/2018 (allergic reaction to oxaliplatin, oxaliplatin discontinued)  Cycle 1 Lonsurf 01/11/2019/Avastin every 2 weeks beginning 01/12/2019 2.Multiple colon polyps on the colonoscopy 05/22/2015, tubular villous adenoma and a tubular adenoma were removed 3.Anemia-likely secondary to rectal bleeding.  4.Family history of multiple cancers including breast and prostate cancer 5.Transient right arm numbness 06/20/2015 6. Port-A-Cath placement 07/06/2015 by Dr. Marcello Moores 7. Mild neutropenia secondary to chemotherapy following cycle 1 FOLFOX. Neulasta added with cycle 2. 8. Delayed nausea following chemotherapy-emend added with cycle 3 FOLFOX;Emend added with cycle 12 FOLFIRI. 9. Early oxaliplatin neuropathy 10. 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 Xeloda resumed at a reduced dose. 11. Bilateral hand weakness. Question related to previous oxaliplatin. Referred to neurology 06/17/2016. 12. Hypertension-amlodipine started 07/08/2016 13. Rash secondary to PANITUMUMAB.  14. Neutropenia following cycle 1 FOLFIRI/PANITUMUMAB; neutropenia following cycle 4 FOLFIRI/PANITUMUMAB.   Disposition: Hunter Walker appears stable.  He did not qualify for the clinical trial at [facility].  Dr. Benay Spice recommends beginning treatment with Lonsurf/Avastin.  We reviewed potential toxicities associated with Lonsurf including bone marrow toxicity, nausea, diarrhea, rash.  We reviewed potential toxicities associated with Avastin including bleeding, delayed wound healing, bowel perforation, increased risk of blood clots/strokes, hypertension, proteinuria.  He agrees to proceed.  We are obtaining baseline labs today.  We are referring him for baseline CT scans later this week.  He will begin cycle 1 Lonsurf 01/11/2019.  He  will begin Avastin every 2 weeks 01/12/2019.  We will see him in follow-up prior to Avastin on 01/26/2019.  He will contact the office in the interim with any problems.  For the rectal pain we sent a prescription to his pharmacy for hydrocodone 1 to 2 tablets every 6 hours as needed.  Patient seen with Dr. Benay Spice.  25 minutes were spent face-to-face at today's visit with the majority of that time involved in counseling/coordination of care.  Ned Card ANP/GNP-BC   01/05/2019  2:21 PM  This was a shared visit with Ned Card.  Hunter Walker did not qualify for a study at [facility] due to a new RAS mutation.  We discussed treatment options.  I recommend Lonsurf/Avastin.  We reviewed potential toxicities associated with this regimen and he agrees to proceed.  Julieanne Manson, MD

## 2019-01-06 ENCOUNTER — Telehealth: Payer: Self-pay | Admitting: Pharmacist

## 2019-01-06 DIAGNOSIS — C20 Malignant neoplasm of rectum: Secondary | ICD-10-CM

## 2019-01-06 MED ORDER — LONSURF 20-8.19 MG PO TABS: 30.0000 mg/m2 | ORAL_TABLET | Freq: Two times a day (BID) | ORAL | 0 refills | Status: DC

## 2019-01-06 NOTE — Telephone Encounter (Signed)
Oral Oncology Pharmacist Encounter  Received new prescription for Lonsurf (trifluridine/tipiracil) for the treatment of heavily pretreated, metastatic rectal cancer in conjunction with Avastin (bevacizumab), planned duration until disease progression or unacceptable toxicity.  Patient has previously received treatment with fluoropyrimidine, oxaliplatin, irinotecan, VEGF inhibitor, and EGFR inhibitor  Patient is now under evaluation to initiate therapy with trifluridine/tipiracil at ~ 30 mg/m2 (36m trifluridine component) by mouth twice daily on days 1-5 & 8-12 of each 28 day cycle. Avastin is planned to be administered at 5 mg/kg IV every 2 weeks  Labs from 01/05/2019 assessed, OK for treatment initiation.  Current medication list in Epic reviewed, no DDIs with Lonsurf or Avastin identified.  [facility].  Oral Oncology Clinic will continue to follow for insurance authorization, copayment issues, initial counseling and start date.  JJohny Drilling PharmD, BCPS, BCOP  01/06/2019 4:16 PM Oral Oncology Clinic [phone]

## 2019-01-07 ENCOUNTER — Telehealth: Payer: Self-pay | Admitting: Oncology

## 2019-01-07 ENCOUNTER — Telehealth: Payer: Self-pay | Admitting: Pharmacist

## 2019-01-07 NOTE — Telephone Encounter (Signed)
Oral Oncology Pharmacist Encounter  Prior authorization for Hunter Walker has been submitted to [facility] on Cover My Meds  Key: [id number] Status: approved Effective dates: 01/07/2019-07/06/2019  Johny Drilling, PharmD, BCPS, BCOP  01/07/2019 8:06 AM Oral Oncology Clinic [phone]

## 2019-01-07 NOTE — Telephone Encounter (Signed)
Called and spoke with patient. Confirmed dates and times of appts ° °

## 2019-01-07 NOTE — Telephone Encounter (Signed)
Oral Oncology Pharmacist Encounter  Lonsurf copayment is too high for patient to afford. Patient assistance application for [facility] completed and faxed to program.  Fax: [phone] Phone: [phone]  This encounter will continue to be updated until final determination.  Johny Drilling, PharmD, BCPS, BCOP  01/07/2019 2:22 PM Oral Oncology Clinic [phone]

## 2019-01-07 NOTE — Telephone Encounter (Signed)
Oral Chemotherapy Pharmacist Encounter     I spoke with patient for overview of: Lonsurf (trifluridine/tipiracil) for the treatment of heavily pretreated, metastatic rectal cancer in conjunction with Avastin (bevacizumab), planned duration until disease progression or unacceptable toxicity.    Counseled patient on administration, dosing, side effects, monitoring, drug-food interactions, safe handling, storage, and disposal.   Patient will take Lonsurf 20mg  (trifluridine component) tablets, 3 tablets (60mg  trifluridine) by mouth twice daily, within 1 hour hour of finishing AM & PM meals, on days 1-5 and days 8-12, every 28 days.  Patient plans to take Lonsurf M-F, Saturday and Sunday off days, Lonsurf M-F again, then no tablets for the next 2 weeks. Patient will start D1 of each cycle on Mondays.  Lonsurf start date: 01/11/2019   Adverse effects include but are not limited to: fatigue, nausea, vomiting, diarrhea, and decreased blood counts.    Patient has anti-emetic on hand and knows to take it if nausea develops.   Patient will obtain anti diarrheal and alert the office of 4 or more loose stools above baseline.  Patient updated about CBC check during 2 week "off" period to ensure resolution of any cytopenia prior to initiation of next cycle of Lonsurf.   Reviewed with patient importance of keeping a medication schedule and plan for any missed doses.   Medication reconciliation performed and medication/allergy list updated.  Insurance authorization for Fran[employer] Poot has been obtained. Test claim at the pharmacy revealed copayment ~$2900 for 1st fill of Lonsurf. This is not affordable to patient.  Patient was provided donated Lonsurf 20mg  tablets so he is able to initiate new treatment plan as scheduled.  Oral oncology clinic is assisting patient in enrolling into manufacturer compassionate use program in an effort to obtain Lonsurf at $0 out of pocket cost to patient. Manufacturer assistance  will be updated in a separate encounter.  All questions answered.  Mr. Diana voiced understanding and appreciation.    Patient knows to call the office with questions or concerns.   Johny Drilling, PharmD, BCPS, BCOP  01/07/2019   12:30 PM Oral Oncology Clinic [phone]

## 2019-01-08 ENCOUNTER — Other Ambulatory Visit: Payer: Self-pay

## 2019-01-08 ENCOUNTER — Ambulatory Visit (HOSPITAL_COMMUNITY)
Admission: RE | Admit: 2019-01-08 | Discharge: 2019-01-08 | Disposition: A | Discharge status: Home / Self Care | Payer: Medicare HMO | Source: Ambulatory Visit | Attending: Nurse Practitioner | Admitting: Nurse Practitioner

## 2019-01-08 DIAGNOSIS — R918 Other nonspecific abnormal finding of lung field: Secondary | ICD-10-CM | POA: Diagnosis not present

## 2019-01-08 DIAGNOSIS — C20 Malignant neoplasm of rectum: Secondary | ICD-10-CM | POA: Insufficient documentation

## 2019-01-08 DIAGNOSIS — C787 Secondary malignant neoplasm of liver and intrahepatic bile duct: Secondary | ICD-10-CM | POA: Diagnosis not present

## 2019-01-08 MED ORDER — SODIUM CHLORIDE (PF) 0.9 % IJ SOLN
INTRAMUSCULAR | Status: AC
  Filled 2019-01-08: qty 50

## 2019-01-08 MED ORDER — IOHEXOL 300 MG/ML  SOLN
100.0000 mL | Freq: Once | INTRAMUSCULAR | Status: AC | PRN
  Administered 2019-01-08: 100 mL via INTRAVENOUS

## 2019-01-08 NOTE — Telephone Encounter (Signed)
thanks

## 2019-01-08 NOTE — Telephone Encounter (Signed)
Oral Oncology Pharmacist Encounter  Received notification from [facility] Patient Support program that patient has been successfully enrolled to receive Lonsurf at $0 out of pocket cost to the patient. Effective dates: 01/07/19-06/03/19  Phone: [phone]  I spoke with patient this morning to update him on the good news. Patient stated the compay had already contacted him yesterday. He will receive his 1st shipment from the patient assistance program sometime next week. He will start his Lonsuf on Monday 8/10 with the morning dose with the donated Lonsurf that was provided to him yesterday.  All questions answered. Patient expressed understanding and appreciation. He knows to call the office with additional questions or concerns.Johny Drilling, PharmD, BCPS, BCOP   Johny Drilling, PharmD, [place], BCOP  01/08/2019 8:28 AM Oral Oncology Clinic [phone]

## 2019-01-10 ENCOUNTER — Other Ambulatory Visit: Payer: Self-pay | Admitting: Oncology

## 2019-01-11 ENCOUNTER — Telehealth: Payer: Self-pay | Admitting: *Deleted

## 2019-01-11 NOTE — Telephone Encounter (Signed)
MD called [place] at [phone] to provide information for a peer-to-peer. Per physician he spoke with, the case has been denied. Denial #[id number]. Notified patient he will not be able to have his treatment tomorrow. Will have managed care begin the appeals process.

## 2019-01-12 ENCOUNTER — Ambulatory Visit: Payer: Medicare HMO

## 2019-01-20 ENCOUNTER — Telehealth: Payer: Self-pay | Admitting: *Deleted

## 2019-01-20 NOTE — Telephone Encounter (Signed)
Opened in error

## 2019-01-25 ENCOUNTER — Encounter: Payer: Self-pay | Admitting: Pharmacy Technician

## 2019-01-26 ENCOUNTER — Inpatient Hospital Stay: Payer: Medicare HMO

## 2019-01-26 ENCOUNTER — Inpatient Hospital Stay (HOSPITAL_BASED_OUTPATIENT_CLINIC_OR_DEPARTMENT_OTHER): Payer: Medicare HMO | Admitting: Nurse Practitioner

## 2019-01-26 ENCOUNTER — Other Ambulatory Visit: Payer: Self-pay

## 2019-01-26 ENCOUNTER — Encounter: Payer: Self-pay | Admitting: Nurse Practitioner

## 2019-01-26 VITALS — BP 112/83 | HR 91 | Temp 98.8°F | Resp 16 | Wt 177.9 lb

## 2019-01-26 DIAGNOSIS — C20 Malignant neoplasm of rectum: Secondary | ICD-10-CM

## 2019-01-26 DIAGNOSIS — L271 Localized skin eruption due to drugs and medicaments taken internally: Secondary | ICD-10-CM | POA: Diagnosis not present

## 2019-01-26 DIAGNOSIS — Z95828 Presence of other vascular implants and grafts: Secondary | ICD-10-CM

## 2019-01-26 DIAGNOSIS — D500 Iron deficiency anemia secondary to blood loss (chronic): Secondary | ICD-10-CM | POA: Diagnosis not present

## 2019-01-26 DIAGNOSIS — R110 Nausea: Secondary | ICD-10-CM | POA: Diagnosis not present

## 2019-01-26 DIAGNOSIS — D701 Agranulocytosis secondary to cancer chemotherapy: Secondary | ICD-10-CM | POA: Diagnosis not present

## 2019-01-26 DIAGNOSIS — C787 Secondary malignant neoplasm of liver and intrahepatic bile duct: Secondary | ICD-10-CM | POA: Diagnosis not present

## 2019-01-26 DIAGNOSIS — K625 Hemorrhage of anus and rectum: Secondary | ICD-10-CM | POA: Diagnosis not present

## 2019-01-26 DIAGNOSIS — R21 Rash and other nonspecific skin eruption: Secondary | ICD-10-CM | POA: Diagnosis not present

## 2019-01-26 DIAGNOSIS — G620 Drug-induced polyneuropathy: Secondary | ICD-10-CM | POA: Diagnosis not present

## 2019-01-26 LAB — CBC WITH DIFFERENTIAL (CANCER CENTER ONLY)
Abs Immature Granulocytes: 0.01 10*3/uL (ref 0.00–0.07)
Basophils Absolute: 0 10*3/uL (ref 0.0–0.1)
Basophils Relative: 1 %
Eosinophils Absolute: 0 10*3/uL (ref 0.0–0.5)
Eosinophils Relative: 2 %
HCT: 36.7 % — ABNORMAL LOW (ref 39.0–52.0)
Hemoglobin: 12.2 g/dL — ABNORMAL LOW (ref 13.0–17.0)
Immature Granulocytes: 1 %
Lymphocytes Relative: 55 %
Lymphs Abs: 1.2 10*3/uL (ref 0.7–4.0)
MCH: 31.1 pg (ref 26.0–34.0)
MCHC: 33.2 g/dL (ref 30.0–36.0)
MCV: 93.6 fL (ref 80.0–100.0)
Monocytes Absolute: 0.1 10*3/uL (ref 0.1–1.0)
Monocytes Relative: 4 %
Neutro Abs: 0.8 10*3/uL — ABNORMAL LOW (ref 1.7–7.7)
Neutrophils Relative %: 37 %
Platelet Count: 156 10*3/uL (ref 150–400)
RBC: 3.92 MIL/uL — ABNORMAL LOW (ref 4.22–5.81)
RDW: 14.2 % (ref 11.5–15.5)
WBC Count: 2.1 10*3/uL — ABNORMAL LOW (ref 4.0–10.5)
nRBC: 0 % (ref 0.0–0.2)

## 2019-01-26 LAB — CMP (CANCER CENTER ONLY)
ALT: 18 U/L (ref 0–44)
AST: 22 U/L (ref 15–41)
Albumin: 3.9 g/dL (ref 3.5–5.0)
Alkaline Phosphatase: 118 U/L (ref 38–126)
Anion gap: 8 (ref 5–15)
BUN: 15 mg/dL (ref 8–23)
CO2: 23 mmol/L (ref 22–32)
Calcium: 9 mg/dL (ref 8.9–10.3)
Chloride: 106 mmol/L (ref 98–111)
Creatinine: 1.26 mg/dL — ABNORMAL HIGH (ref 0.61–1.24)
GFR, Est AFR Am: >60 mL/min (ref >60)
GFR, Estimated: 59 mL/min — ABNORMAL LOW (ref >60)
Glucose, Bld: 108 mg/dL — ABNORMAL HIGH (ref 70–99)
Potassium: 3.7 mmol/L (ref 3.5–5.1)
Sodium: 137 mmol/L (ref 135–145)
Total Bilirubin: 0.7 mg/dL (ref 0.3–1.2)
Total Protein: 8.2 g/dL — ABNORMAL HIGH (ref 6.5–8.1)

## 2019-01-26 LAB — TOTAL PROTEIN, URINE DIPSTICK: Protein, ur: NEGATIVE mg/dL

## 2019-01-26 MED ORDER — HYDROCODONE-ACETAMINOPHEN 5-325 MG PO TABS: 1.0000 | ORAL_TABLET | Freq: Four times a day (QID) | ORAL | 0 refills | Status: DC | PRN

## 2019-01-26 MED ORDER — HEPARIN SOD (PORK) LOCK FLUSH 100 UNIT/ML IV SOLN
500.0000 [IU] | Freq: Once | INTRAVENOUS | Status: AC | PRN
  Administered 2019-01-26: 500 [IU] via INTRAVENOUS
  Filled 2019-01-26: qty 5

## 2019-01-26 MED ORDER — SODIUM CHLORIDE 0.9% FLUSH
10.0000 mL | Freq: Once | INTRAVENOUS | Status: AC
  Administered 2019-01-26: 15:00:00 10 mL
  Filled 2019-01-26: qty 10

## 2019-01-26 NOTE — Progress Notes (Addendum)
[place] OFFICE PROGRESS NOTE   Diagnosis: Rectal cancer  INTERVAL HISTORY:   Hunter Walker returns as scheduled.  He began cycle 1 Lonsurf 01/11/2019.  No significant nausea/vomiting.  No mouth sores.  No diarrhea.  He does note that his stools have become more liquid but not watery. He has pain with bowel movements.  No rash.  He has a good appetite.  No fever, cough, shortness of breath.  He has intermittent numbness in the hands and feet.  Objective:  Vital signs in last 24 hours:  Blood pressure 112/83, pulse 91, temperature 98.8 F (37.1 C), temperature source Temporal, resp. rate 16, weight 177 lb 14.4 oz (80.7 kg), SpO2 100 %.    HEENT: No thrush or ulcers. GI: Abdomen soft and nontender.  No hepatomegaly. Vascular: No leg edema. Skin: Palms without erythema.  No skin rash. Port-A-Cath without erythema.   Lab Results:  Lab Results  Component Value Date   WBC 2.1 (L) 01/26/2019   HGB 12.2 (L) 01/26/2019   HCT 36.7 (L) 01/26/2019   MCV 93.6 01/26/2019   PLT 156 01/26/2019   NEUTROABS PENDING 01/26/2019    Imaging:  No results found.  Medications: I have reviewed the patient's current medications.  Assessment/Plan: 1.Rectal cancer, invasive adenocarcinoma confirmed at colonoscopy 05/22/2015, EUS staging 06/01/2015 revealed a uT3,N1 lesion at 7 cm from the anal verge  Staging CT abdomen/pelvis 05/15/2015 confirmed a mass at the rectosigmoid colon, a suspicious perirectal lymph node, and a 6 mm right liver lesion felt to most likely be a cyst  CT chest 05/30/2015 with indeterminate pulmonary nodules and multiple liver lesions suspicious for metastases  Ultrasound 06/13/2014 with no liver lesions identified  MRI abdomen 06/21/2015 consistent with multiple liver metastases  CT biopsy of liver lesion 06/26/2015. Pathology showed metastatic adenocarcinoma consistent with colonic primary.MSI-stable, low mutation burden, no RAS or BRAFmutation   Cycle 1 FOLFOX 07/10/2015  Cycle 2 FOLFOX plus Avastin 07/24/2015 with Neulasta support  Cycle 3 FOLFOX plus Avastin 08/07/2015  Cycle 4 FOLFOX plus Avastin 08/21/2015  cycle 5 FOLFOX plus Avastin 09/04/2015  Restaging CTs 09/15/2015 revealed a decrease in the size of liver lesions, decreased rectal primary, stable/decreased size of tiny lung nodules  Cycle 6 FOLFOX plus Avastin 09/18/2015  Cycle 7 FOLFOX plus Avastin 10/02/2015  Cycle 8 FOLFOX plus Avastin 10/16/2015  Cycle 9 FOLFOX plus Avastin 11/06/2015  Cycle 10 FOLFOX plus Avastin 11/20/2015  CTs 12/07/2015-mild decrease in wall thickening at the rectum, decrease in tiny hepatic metastases, stable lung nodules, no evidence of progressive metastatic disease, new left lower lobe infiltrate  Treatment changed to every three-week Avastin with every other week Xeloda beginning 12/11/2015  Restaging CTs 04/12/2016-interval increase in size of adjacent right upper lobe pulmonary nodules and right lower lobe pulmonary nodule; unchanged 5 mm lesion right hepatic lobe; no additional visualized hepatic metastasis. Grossly unchanged wall thickening of the rectum.  Continuation of Xeloda every other week and every three-week AvastinXeloda placed on hold 05/06/2016 due to hand-foot syndrome  Xeloda resumed 05/28/2016 1500 mg every morning and 1000 mg every afternoon 7 days on/7 days off  CT 09/27/2016-enlargement of lung nodules and new liver lesions, stable rectal mass  Cycle 1 FOLFIRI/panitumumab 10/08/2016  Cycle 2 panitumumab 10/23/2016 (FOLFIRI held secondary to neutropenia)  Cycle 3 FOLFIRI/PANITUMUMAB 11/04/2016  Cycle 4 FOLFIRI/panitumumab 11/18/2016  Cycle 5 FOLFIRI/panitumumab 12/16/2016  Cycle 6 FOLFIRI/panitumumab 12/31/2016  CTs 01/16/2017-decreased size of pulmonary nodules and liver lesions. Decreased perirectal lymph node  Cycle  7 FOLFIRI/PANITUMUMAB 01/28/2017  Cycle 8FOLFIRI/PANITUMUMAB 02/10/2017  Cycle  9 FOLFIRI/PANITUMUMAB 02/24/2017  Cycle 10 FOLFIRI/panitumumab 03/10/2017 (irinotecan held secondary to neutropenia)  Cycle 11 FOLFIRI/panitumumab 03/24/2017  Cycle12 FOLFIRI/Panitumumab 04/07/2017  Restaging CTs 04/25/2017-stable rectal mass with adjacent 8 mm left perirectal nodal metastasis. Prior hepatic metastases not visualized on the current study. Indeterminate 16 mm enhancing lesion present in segment 6, indeterminate. Minimal residual nodularity in the right upper lobe. No new/progressive pulmonary metastases.  Maintenance Xeloda/panitumumab 04/28/2017  Restaging CTs 08/18/2017-new and enlarging bilateral lung nodules; 2 hyperattenuating lesions in the liver appear similar to prior study; progression of wall thickening in the rectum with enlarging perirectal lymph nodes.  Initiation of radiation/Xeloda 09/01/2017, completed 09/22/2017  Cycle 1 FOLFIRI/Panitumumab 10/13/2017  Cycle 2 FOLFIRI 11/03/2017  Cycle 3 FOLFIRI 11/17/2017  Cycle 4 FOLFIRI 12/01/2017  Cycle 5 FOLFIRI 12/15/2017  CTs7/26/2019-mild progression lungs and liver; significantly improved rectal wall thickening; resolution of perirectal nodal adenopathy.  Cycle 6 FOLFIRI 12/29/2017  Cycle 7 FOLFIRI 01/12/2018  Cycle 8 FOLFIRI 01/26/2018  Cycle 9 FOLFIRI 02/09/2018  Cycle 10 FOLFIRI 02/23/2018  Restaging CTs 03/06/2018-majority of the pulmonary nodules are similar in size, at least 2 of the nodules are minimally increased in size. Similar appearing lesions within the liver. Similar-appearing mild wall thickening of the rectum.  Cycle 11FOLFIRI 03/09/2018  Cycle 12 FOLFIRI 03/23/2018  Cycle 13 FOLFIRI 04/06/2018  Cycle 14 FOLFIRI 04/20/2018  Cycle 15 FOLFIRI 05/04/2018  CT 05/15/2018-no change in pulmonary nodules, mild increase in size of 2 liver lesions, stable rectal wall thickening  On study at [facility], [place] with palbociclib andUlixertinibbeginning 07/15/2018  [facility]   Restaging CTs 10/07/2018-enlargement of multiple lateral pulmonary nodules and liver lesions; redemonstrated circumferential thickening of the mid rectum with interval enlargement of an irregular nodule of the left perirectal fat.  Cycle 1 FOLFOX 10/12/2018  Cycle 2 FOLFOX 11/02/2018 (allergic reaction to oxaliplatin, oxaliplatin discontinued)  Guardant testing 11/30/2018-KRAS Q61H, EGFR amplification, MSI high not detected (NOTE: Plan for repeat guardant testing at a 48-monthinterval)  cycle 1 Lonsurf 01/11/2019  Cycle 2 Lonsurf 02/08/2019 2.Multiple colon polyps on the colonoscopy 05/22/2015, tubular villous adenoma and a tubular adenoma were removed 3.Anemia-likely secondary to rectal bleeding.  4.Family history of multiple cancers including breast and prostate cancer 5.Transient right arm numbness 06/20/2015 6. Port-A-Cath placement 07/06/2015 by Dr. TMarcello Moores7. Mild neutropenia secondary to chemotherapy following cycle 1 FOLFOX. Neulasta added with cycle 2. 8. Delayed nausea following chemotherapy-emend added with cycle 3 FOLFOX;Emend added with cycle 12 FOLFIRI. 9. Early oxaliplatin neuropathy 10. 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 Xeloda resumed at a reduced dose. 11. Bilateral hand weakness. Question related to previous oxaliplatin. Referred to neurology 06/17/2016. 12. Hypertension-amlodipine started 07/08/2016 13. Rash secondary to PANITUMUMAB.  14. Neutropenia following cycle 1 FOLFIRI/PANITUMUMAB; neutropenia following cycle 4 FOLFIRI/PANITUMUMAB.  Disposition: Mr. WSahagunappears stable.  He has completed 1 cycle of Lonsurf. Overall he tolerated well.  He will begin cycle 2 Lonsurf on 02/08/2019 (pending recovery of the neutrophil count on labs 02/05/2019).  We are awaiting insurance approval on Avastin.  We reviewed the CBC from today.  He has neutropenia.  Precautions reviewed.  He will return for a follow-up CBC on 02/05/2019.  He will return  for follow-up and Avastin on 02/09/2019.  He will contact the office in the interim with any problems. We specifically discussed fever, chills, other signs of infection.  Plan reviewed with Dr. SBenay Spice    LNed CardANP/GNP-BC  01/26/2019  3:32 PM

## 2019-01-28 ENCOUNTER — Telehealth: Payer: Self-pay | Admitting: Oncology

## 2019-01-28 NOTE — Telephone Encounter (Signed)
Called and spoke with patient. Confirmed dates and times of appt  ° °

## 2019-02-05 ENCOUNTER — Other Ambulatory Visit: Payer: Self-pay

## 2019-02-05 ENCOUNTER — Encounter: Payer: Self-pay | Admitting: Pharmacy Technician

## 2019-02-05 ENCOUNTER — Inpatient Hospital Stay: Payer: Medicare HMO

## 2019-02-05 ENCOUNTER — Telehealth: Payer: Self-pay | Admitting: Nurse Practitioner

## 2019-02-05 ENCOUNTER — Inpatient Hospital Stay: Payer: Medicare HMO | Attending: Nurse Practitioner

## 2019-02-05 DIAGNOSIS — K6289 Other specified diseases of anus and rectum: Secondary | ICD-10-CM | POA: Diagnosis not present

## 2019-02-05 DIAGNOSIS — R531 Weakness: Secondary | ICD-10-CM | POA: Insufficient documentation

## 2019-02-05 DIAGNOSIS — R21 Rash and other nonspecific skin eruption: Secondary | ICD-10-CM | POA: Diagnosis not present

## 2019-02-05 DIAGNOSIS — Z8042 Family history of malignant neoplasm of prostate: Secondary | ICD-10-CM | POA: Insufficient documentation

## 2019-02-05 DIAGNOSIS — G620 Drug-induced polyneuropathy: Secondary | ICD-10-CM | POA: Diagnosis not present

## 2019-02-05 DIAGNOSIS — Z79899 Other long term (current) drug therapy: Secondary | ICD-10-CM | POA: Diagnosis not present

## 2019-02-05 DIAGNOSIS — I10 Essential (primary) hypertension: Secondary | ICD-10-CM | POA: Diagnosis not present

## 2019-02-05 DIAGNOSIS — T451X5A Adverse effect of antineoplastic and immunosuppressive drugs, initial encounter: Secondary | ICD-10-CM | POA: Insufficient documentation

## 2019-02-05 DIAGNOSIS — L271 Localized skin eruption due to drugs and medicaments taken internally: Secondary | ICD-10-CM | POA: Insufficient documentation

## 2019-02-05 DIAGNOSIS — Z5112 Encounter for antineoplastic immunotherapy: Secondary | ICD-10-CM | POA: Insufficient documentation

## 2019-02-05 DIAGNOSIS — Z86010 Personal history of colonic polyps: Secondary | ICD-10-CM | POA: Insufficient documentation

## 2019-02-05 DIAGNOSIS — Z8719 Personal history of other diseases of the digestive system: Secondary | ICD-10-CM | POA: Diagnosis not present

## 2019-02-05 DIAGNOSIS — D701 Agranulocytosis secondary to cancer chemotherapy: Secondary | ICD-10-CM | POA: Insufficient documentation

## 2019-02-05 DIAGNOSIS — R160 Hepatomegaly, not elsewhere classified: Secondary | ICD-10-CM | POA: Insufficient documentation

## 2019-02-05 DIAGNOSIS — Z803 Family history of malignant neoplasm of breast: Secondary | ICD-10-CM | POA: Diagnosis not present

## 2019-02-05 DIAGNOSIS — D649 Anemia, unspecified: Secondary | ICD-10-CM | POA: Diagnosis not present

## 2019-02-05 DIAGNOSIS — Z23 Encounter for immunization: Secondary | ICD-10-CM | POA: Diagnosis not present

## 2019-02-05 DIAGNOSIS — D709 Neutropenia, unspecified: Secondary | ICD-10-CM | POA: Diagnosis not present

## 2019-02-05 DIAGNOSIS — C20 Malignant neoplasm of rectum: Secondary | ICD-10-CM | POA: Diagnosis not present

## 2019-02-05 DIAGNOSIS — C787 Secondary malignant neoplasm of liver and intrahepatic bile duct: Secondary | ICD-10-CM | POA: Diagnosis not present

## 2019-02-05 DIAGNOSIS — Z95828 Presence of other vascular implants and grafts: Secondary | ICD-10-CM

## 2019-02-05 DIAGNOSIS — R110 Nausea: Secondary | ICD-10-CM | POA: Insufficient documentation

## 2019-02-05 DIAGNOSIS — R918 Other nonspecific abnormal finding of lung field: Secondary | ICD-10-CM | POA: Insufficient documentation

## 2019-02-05 LAB — CBC WITH DIFFERENTIAL (CANCER CENTER ONLY)
Abs Immature Granulocytes: 0.03 10*3/uL (ref 0.00–0.07)
Basophils Absolute: 0 10*3/uL (ref 0.0–0.1)
Basophils Relative: 0 %
Eosinophils Absolute: 0 10*3/uL (ref 0.0–0.5)
Eosinophils Relative: 0 %
HCT: 33.4 % — ABNORMAL LOW (ref 39.0–52.0)
Hemoglobin: 10.8 g/dL — ABNORMAL LOW (ref 13.0–17.0)
Immature Granulocytes: 1 %
Lymphocytes Relative: 39 %
Lymphs Abs: 1.5 10*3/uL (ref 0.7–4.0)
MCH: 31 pg (ref 26.0–34.0)
MCHC: 32.3 g/dL (ref 30.0–36.0)
MCV: 96 fL (ref 80.0–100.0)
Monocytes Absolute: 0.5 10*3/uL (ref 0.1–1.0)
Monocytes Relative: 12 %
Neutro Abs: 1.8 10*3/uL (ref 1.7–7.7)
Neutrophils Relative %: 48 %
Platelet Count: 269 10*3/uL (ref 150–400)
RBC: 3.48 MIL/uL — ABNORMAL LOW (ref 4.22–5.81)
RDW: 14.7 % (ref 11.5–15.5)
WBC Count: 3.9 10*3/uL — ABNORMAL LOW (ref 4.0–10.5)
nRBC: 0 % (ref 0.0–0.2)

## 2019-02-05 LAB — CMP (CANCER CENTER ONLY)
ALT: 23 U/L (ref 0–44)
AST: 23 U/L (ref 15–41)
Albumin: 3.4 g/dL — ABNORMAL LOW (ref 3.5–5.0)
Alkaline Phosphatase: 98 U/L (ref 38–126)
Anion gap: 10 (ref 5–15)
BUN: 10 mg/dL (ref 8–23)
CO2: 23 mmol/L (ref 22–32)
Calcium: 8.9 mg/dL (ref 8.9–10.3)
Chloride: 105 mmol/L (ref 98–111)
Creatinine: 1.14 mg/dL (ref 0.61–1.24)
GFR, Est AFR Am: >60 mL/min (ref >60)
GFR, Estimated: >60 mL/min (ref >60)
Glucose, Bld: 102 mg/dL — ABNORMAL HIGH (ref 70–99)
Potassium: 3.9 mmol/L (ref 3.5–5.1)
Sodium: 138 mmol/L (ref 135–145)
Total Bilirubin: 0.2 mg/dL — ABNORMAL LOW (ref 0.3–1.2)
Total Protein: 7.9 g/dL (ref 6.5–8.1)

## 2019-02-05 LAB — CEA (IN HOUSE-CHCC): CEA (CHCC-In House): 336.99 ng/mL — ABNORMAL HIGH (ref 0.00–5.00)

## 2019-02-05 MED ORDER — SODIUM CHLORIDE 0.9% FLUSH
10.0000 mL | Freq: Once | INTRAVENOUS | Status: AC
  Administered 2019-02-05: 10:00:00 10 mL
  Filled 2019-02-05: qty 10

## 2019-02-05 NOTE — Progress Notes (Signed)
The patient is approved for drug assistance by [place] for Avastin.  Enrollment is effective until 02/02/21 and is based on insurance denial.  Drug replacement will begin on DOS 01/02/19.

## 2019-02-05 NOTE — Telephone Encounter (Signed)
I contacted Hunter Walker regarding the CBC from today.  The white count is better.  He can proceed with the next cycle of Lonsurf as scheduled on 02/08/2019 but will decrease the dose to 2 tablets twice daily days 1 through 5 and 8 through 12.

## 2019-02-09 ENCOUNTER — Other Ambulatory Visit: Payer: Self-pay

## 2019-02-09 ENCOUNTER — Encounter: Payer: Self-pay | Admitting: Nurse Practitioner

## 2019-02-09 ENCOUNTER — Telehealth: Payer: Self-pay | Admitting: Medical Oncology

## 2019-02-09 ENCOUNTER — Inpatient Hospital Stay: Payer: Medicare HMO

## 2019-02-09 ENCOUNTER — Inpatient Hospital Stay (HOSPITAL_BASED_OUTPATIENT_CLINIC_OR_DEPARTMENT_OTHER): Payer: Medicare HMO | Admitting: Nurse Practitioner

## 2019-02-09 VITALS — BP 131/91 | HR 87 | Temp 98.9°F | Resp 18 | Wt 176.0 lb

## 2019-02-09 VITALS — BP 110/76 | HR 84 | Resp 18

## 2019-02-09 DIAGNOSIS — T451X5A Adverse effect of antineoplastic and immunosuppressive drugs, initial encounter: Secondary | ICD-10-CM | POA: Diagnosis not present

## 2019-02-09 DIAGNOSIS — D701 Agranulocytosis secondary to cancer chemotherapy: Secondary | ICD-10-CM | POA: Diagnosis not present

## 2019-02-09 DIAGNOSIS — C20 Malignant neoplasm of rectum: Secondary | ICD-10-CM

## 2019-02-09 DIAGNOSIS — K6289 Other specified diseases of anus and rectum: Secondary | ICD-10-CM | POA: Diagnosis not present

## 2019-02-09 DIAGNOSIS — L271 Localized skin eruption due to drugs and medicaments taken internally: Secondary | ICD-10-CM | POA: Diagnosis not present

## 2019-02-09 DIAGNOSIS — Z5112 Encounter for antineoplastic immunotherapy: Secondary | ICD-10-CM | POA: Diagnosis not present

## 2019-02-09 DIAGNOSIS — Z23 Encounter for immunization: Secondary | ICD-10-CM | POA: Diagnosis not present

## 2019-02-09 DIAGNOSIS — D649 Anemia, unspecified: Secondary | ICD-10-CM | POA: Diagnosis not present

## 2019-02-09 DIAGNOSIS — C787 Secondary malignant neoplasm of liver and intrahepatic bile duct: Secondary | ICD-10-CM | POA: Diagnosis not present

## 2019-02-09 MED ORDER — SODIUM CHLORIDE 0.9% FLUSH
10.0000 mL | INTRAVENOUS | Status: DC | PRN
  Administered 2019-02-09: 16:00:00 10 mL
  Filled 2019-02-09: qty 10

## 2019-02-09 MED ORDER — HEPARIN SOD (PORK) LOCK FLUSH 100 UNIT/ML IV SOLN
500.0000 [IU] | Freq: Once | INTRAVENOUS | Status: AC | PRN
  Administered 2019-02-09: 500 [IU]
  Filled 2019-02-09: qty 5

## 2019-02-09 MED ORDER — SODIUM CHLORIDE 0.9 % IV SOLN
5.0000 mg/kg | Freq: Once | INTRAVENOUS | Status: AC
  Administered 2019-02-09: 400 mg via INTRAVENOUS
  Filled 2019-02-09: qty 16

## 2019-02-09 MED ORDER — SODIUM CHLORIDE 0.9 % IV SOLN
Freq: Once | INTRAVENOUS | Status: AC
  Administered 2019-02-09: 16:00:00 via INTRAVENOUS
  Filled 2019-02-09: qty 250

## 2019-02-09 NOTE — Patient Instructions (Signed)
[facility] Discharge Instructions for Patients Receiving Chemotherapy  Today you received the following chemotherapy agents; Avastin.   To help prevent nausea and vomiting after your treatment, we encourage you to take your nausea medication as directed.    If you develop nausea and vomiting that is not controlled by your nausea medication, call the clinic.   BELOW ARE SYMPTOMS THAT SHOULD BE REPORTED IMMEDIATELY:  *FEVER GREATER THAN 100.5 F  *CHILLS WITH OR WITHOUT FEVER  NAUSEA AND VOMITING THAT IS NOT CONTROLLED WITH YOUR NAUSEA MEDICATION  *UNUSUAL SHORTNESS OF BREATH  *UNUSUAL BRUISING OR BLEEDING  TENDERNESS IN MOUTH AND THROAT WITH OR WITHOUT PRESENCE OF ULCERS  *URINARY PROBLEMS  *BOWEL PROBLEMS  UNUSUAL RASH Items with * indicate a potential emergency and should be followed up as soon as possible.  Feel free to call the clinic you have any questions or concerns. The clinic phone number is [phone].    

## 2019-02-09 NOTE — Progress Notes (Signed)
[facility] OFFICE PROGRESS NOTE   Diagnosis: Rectal cancer  INTERVAL HISTORY:   H[facility] Walker returns as scheduled.  He began cycle 2 Lonsurf at a reduced dose on 02/08/2019.  He denies nausea/vomiting.  No mouth sores.  Bowels are moving.  He describes stool consistency as "syrupy".  No bleeding.  He has pain with bowel movements.  He has frequent small-volume stools.  Objective:  Vital signs in last 24 hours:  Blood pressure (!) 131/91, pulse 87, temperature 98.9 F (37.2 C), temperature source Oral, resp. rate 18, weight 176 lb 0.7 oz (79.9 kg), SpO2 100 %.    HEENT: No thrush or ulcers. GI: Abdomen soft and nontender.  No hepatomegaly. Vascular: No leg edema. Neuro: Alert and oriented. Skin: No rash. Port-A-Cath without erythema.   Lab Results:  Lab Results  Component Value Date   WBC 3.9 (L) 02/05/2019   HGB 10.8 (L) 02/05/2019   HCT 33.4 (L) 02/05/2019   MCV 96.0 02/05/2019   PLT 269 02/05/2019   NEUTROABS 1.8 02/05/2019    Imaging:  No results found.  Medications: I have reviewed the patient's current medications.  Assessment/Plan: 1.Rectal cancer, invasive adenocarcinoma confirmed at colonoscopy 05/22/2015, EUS staging 06/01/2015 revealed a uT3,N1 lesion at 7 cm from the anal verge  Staging CT abdomen/pelvis 05/15/2015 confirmed a mass at the rectosigmoid colon, a suspicious perirectal lymph node, and a 6 mm right liver lesion felt to most likely be a cyst  CT chest 05/30/2015 with indeterminate pulmonary nodules and multiple liver lesions suspicious for metastases  Ultrasound 06/13/2014 with no liver lesions identified  MRI abdomen 06/21/2015 consistent with multiple liver metastases  CT biopsy of liver lesion 06/26/2015. Pathology showed metastatic adenocarcinoma consistent with colonic primary.MSI-stable, low mutation burden, no RAS or BRAFmutation  Cycle 1 FOLFOX 07/10/2015  Cycle 2 FOLFOX plus Avastin 07/24/2015 with Neulasta  support  Cycle 3 FOLFOX plus Avastin 08/07/2015  Cycle 4 FOLFOX plus Avastin 08/21/2015  cycle 5 FOLFOX plus Avastin 09/04/2015  Restaging CTs 09/15/2015 revealed a decrease in the size of liver lesions, decreased rectal primary, stable/decreased size of tiny lung nodules  Cycle 6 FOLFOX plus Avastin 09/18/2015  Cycle 7 FOLFOX plus Avastin 10/02/2015  Cycle 8 FOLFOX plus Avastin 10/16/2015  Cycle 9 FOLFOX plus Avastin 11/06/2015  Cycle 10 FOLFOX plus Avastin 11/20/2015  CTs 12/07/2015-mild decrease in wall thickening at the rectum, decrease in tiny hepatic metastases, stable lung nodules, no evidence of progressive metastatic disease, new left lower lobe infiltrate  Treatment changed to every three-week Avastin with every other week Xeloda beginning 12/11/2015  Restaging CTs 04/12/2016-interval increase in size of adjacent right upper lobe pulmonary nodules and right lower lobe pulmonary nodule; unchanged 5 mm lesion right hepatic lobe; no additional visualized hepatic metastasis. Grossly unchanged wall thickening of the rectum.  Continuation of Xeloda every other week and every three-week AvastinXeloda placed on hold 05/06/2016 due to hand-foot syndrome  Xeloda resumed 05/28/2016 1500 mg every morning and 1000 mg every afternoon 7 days on/7 days off  CT 09/27/2016-enlargement of lung nodules and new liver lesions, stable rectal mass  Cycle 1 FOLFIRI/panitumumab 10/08/2016  Cycle 2 panitumumab 10/23/2016 (FOLFIRI held secondary to neutropenia)  Cycle 3 FOLFIRI/PANITUMUMAB 11/04/2016  Cycle 4 FOLFIRI/panitumumab 11/18/2016  Cycle 5 FOLFIRI/panitumumab 12/16/2016  Cycle 6 FOLFIRI/panitumumab 12/31/2016  CTs 01/16/2017-decreased size of pulmonary nodules and liver lesions. Decreased perirectal lymph node  Cycle 7 FOLFIRI/PANITUMUMAB 01/28/2017  Cycle 8FOLFIRI/PANITUMUMAB 02/10/2017  Cycle 9 FOLFIRI/PANITUMUMAB 02/24/2017  Cycle 10 FOLFIRI/panitumumab 03/10/2017  (  irinotecan held secondary to neutropenia)  Cycle 11 FOLFIRI/panitumumab 03/24/2017  Cycle12 FOLFIRI/Panitumumab 04/07/2017  Restaging CTs 04/25/2017-stable rectal mass with adjacent 8 mm left perirectal nodal metastasis. Prior hepatic metastases not visualized on the current study. Indeterminate 16 mm enhancing lesion present in segment 6, indeterminate. Minimal residual nodularity in the right upper lobe. No new/progressive pulmonary metastases.  Maintenance Xeloda/panitumumab 04/28/2017  Restaging CTs 08/18/2017-new and enlarging bilateral lung nodules; 2 hyperattenuating lesions in the liver appear similar to prior study; progression of wall thickening in the rectum with enlarging perirectal lymph nodes.  Initiation of radiation/Xeloda 09/01/2017, completed 09/22/2017  Cycle 1 FOLFIRI/Panitumumab 10/13/2017  Cycle 2 FOLFIRI 11/03/2017  Cycle 3 FOLFIRI 11/17/2017  Cycle 4 FOLFIRI 12/01/2017  Cycle 5 FOLFIRI 12/15/2017  CTs7/26/2019-mild progression lungs and liver; significantly improved rectal wall thickening; resolution of perirectal nodal adenopathy.  Cycle 6 FOLFIRI 12/29/2017  Cycle 7 FOLFIRI 01/12/2018  Cycle 8 FOLFIRI 01/26/2018  Cycle 9 FOLFIRI 02/09/2018  Cycle 10 FOLFIRI 02/23/2018  Restaging CTs 03/06/2018-majority of the pulmonary nodules are similar in size, at least 2 of the nodules are minimally increased in size. Similar appearing lesions within the liver. Similar-appearing mild wall thickening of the rectum.  Cycle 11FOLFIRI 03/09/2018  Cycle 12 FOLFIRI 03/23/2018  Cycle 13 FOLFIRI 04/06/2018  Cycle 14 FOLFIRI 04/20/2018  Cycle 15 FOLFIRI 05/04/2018  CT 05/15/2018-no change in pulmonary nodules, mild increase in size of 2 liver lesions, stable rectal wall thickening  On study at [facility], [place] with palbociclib andUlixertinibbeginning 07/15/2018  [facility]  Restaging CTs 10/07/2018-enlargement of multiple lateral pulmonary  nodules and liver lesions; redemonstrated circumferential thickening of the mid rectum with interval enlargement of an irregular nodule of the left perirectal fat.  Cycle 1 FOLFOX 10/12/2018  Cycle 2 FOLFOX 11/02/2018 (allergic reaction to oxaliplatin, oxaliplatin discontinued)  Guardant testing 11/30/2018-KRAS Q61H, EGFR amplification, MSI high not detected (NOTE: Plan for repeat guardant testing at a 30-monthinterval)  Cycle 1 Lonsurf 01/11/2019  Cycle 2 Lonsurf 02/08/2019/Avastin 02/09/2019 2.Multiple colon polyps on the colonoscopy 05/22/2015, tubular villous adenoma and a tubular adenoma were removed 3.Anemia-likely secondary to rectal bleeding.  4.Family history of multiple cancers including breast and prostate cancer 5.Transient right arm numbness 06/20/2015 6. Port-A-Cath placement 07/06/2015 by Dr. TMarcello Moores7. Mild neutropenia secondary to chemotherapy following cycle 1 FOLFOX. Neulasta added with cycle 2. 8. Delayed nausea following chemotherapy-emend added with cycle 3 FOLFOX;Emend added with cycle 12 FOLFIRI. 9. Early oxaliplatin neuropathy 10. 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 Xeloda resumed at a reduced dose. 11. Bilateral hand weakness. Question related to previous oxaliplatin. Referred to neurology 06/17/2016. 12. Hypertension-amlodipine started 07/08/2016 13. Rash secondary to PANITUMUMAB.  14. Neutropenia following cycle 1 FOLFIRI/PANITUMUMAB; neutropenia following cycle 4 FOLFIRI/PANITUMUMAB.  Disposition: Mr. WGrayerappears unchanged.  He began cycle 2 Lonsurf 02/08/2019, dose reduced due to neutropenia following cycle 1.  He will begin Avastin on a 2-week schedule today.  We reviewed the labs from last week, adequate to proceed.  He will return for lab, follow-up, Avastin in 2 weeks.  He will contact the office in the interim with any problems.  Plan reviewed with Dr. SBenay Spice    LNed CardANP/GNP-BC   02/09/2019  2:42 PM

## 2019-02-09 NOTE — Telephone Encounter (Signed)
Wife stated pt left the house in [place] at 1 pm. He has not shown up for appt. Per registration pt arrived on time and registration is behind.

## 2019-02-21 ENCOUNTER — Other Ambulatory Visit: Payer: Self-pay | Admitting: Oncology

## 2019-02-23 ENCOUNTER — Other Ambulatory Visit: Payer: Self-pay

## 2019-02-23 ENCOUNTER — Inpatient Hospital Stay (HOSPITAL_BASED_OUTPATIENT_CLINIC_OR_DEPARTMENT_OTHER): Payer: Medicare HMO | Admitting: Nurse Practitioner

## 2019-02-23 ENCOUNTER — Inpatient Hospital Stay: Payer: Medicare HMO

## 2019-02-23 ENCOUNTER — Encounter: Payer: Self-pay | Admitting: Nurse Practitioner

## 2019-02-23 VITALS — BP 106/74

## 2019-02-23 VITALS — BP 120/84 | HR 79 | Temp 98.7°F | Resp 18 | Ht 73.0 in | Wt 178.2 lb

## 2019-02-23 DIAGNOSIS — C787 Secondary malignant neoplasm of liver and intrahepatic bile duct: Secondary | ICD-10-CM | POA: Diagnosis not present

## 2019-02-23 DIAGNOSIS — C20 Malignant neoplasm of rectum: Secondary | ICD-10-CM

## 2019-02-23 DIAGNOSIS — Z95828 Presence of other vascular implants and grafts: Secondary | ICD-10-CM

## 2019-02-23 DIAGNOSIS — Z23 Encounter for immunization: Secondary | ICD-10-CM

## 2019-02-23 DIAGNOSIS — L271 Localized skin eruption due to drugs and medicaments taken internally: Secondary | ICD-10-CM | POA: Diagnosis not present

## 2019-02-23 DIAGNOSIS — D701 Agranulocytosis secondary to cancer chemotherapy: Secondary | ICD-10-CM | POA: Diagnosis not present

## 2019-02-23 DIAGNOSIS — T451X5A Adverse effect of antineoplastic and immunosuppressive drugs, initial encounter: Secondary | ICD-10-CM | POA: Diagnosis not present

## 2019-02-23 DIAGNOSIS — Z5112 Encounter for antineoplastic immunotherapy: Secondary | ICD-10-CM | POA: Diagnosis not present

## 2019-02-23 DIAGNOSIS — D649 Anemia, unspecified: Secondary | ICD-10-CM | POA: Diagnosis not present

## 2019-02-23 DIAGNOSIS — K6289 Other specified diseases of anus and rectum: Secondary | ICD-10-CM | POA: Diagnosis not present

## 2019-02-23 LAB — CMP (CANCER CENTER ONLY)
ALT: 14 U/L (ref 0–44)
AST: 19 U/L (ref 15–41)
Albumin: 3.8 g/dL (ref 3.5–5.0)
Alkaline Phosphatase: 103 U/L (ref 38–126)
Anion gap: 6 (ref 5–15)
BUN: 15 mg/dL (ref 8–23)
CO2: 26 mmol/L (ref 22–32)
Calcium: 8.9 mg/dL (ref 8.9–10.3)
Chloride: 104 mmol/L (ref 98–111)
Creatinine: 1.25 mg/dL — ABNORMAL HIGH (ref 0.61–1.24)
GFR, Est AFR Am: >60 mL/min (ref >60)
GFR, Estimated: 59 mL/min — ABNORMAL LOW (ref >60)
Glucose, Bld: 104 mg/dL — ABNORMAL HIGH (ref 70–99)
Potassium: 4.3 mmol/L (ref 3.5–5.1)
Sodium: 136 mmol/L (ref 135–145)
Total Bilirubin: 0.4 mg/dL (ref 0.3–1.2)
Total Protein: 8 g/dL (ref 6.5–8.1)

## 2019-02-23 LAB — CBC WITH DIFFERENTIAL (CANCER CENTER ONLY)
Abs Immature Granulocytes: 0.01 10*3/uL (ref 0.00–0.07)
Basophils Absolute: 0 10*3/uL (ref 0.0–0.1)
Basophils Relative: 0 %
Eosinophils Absolute: 0 10*3/uL (ref 0.0–0.5)
Eosinophils Relative: 1 %
HCT: 31.7 % — ABNORMAL LOW (ref 39.0–52.0)
Hemoglobin: 10.3 g/dL — ABNORMAL LOW (ref 13.0–17.0)
Immature Granulocytes: 0 %
Lymphocytes Relative: 52 %
Lymphs Abs: 1.6 10*3/uL (ref 0.7–4.0)
MCH: 30.9 pg (ref 26.0–34.0)
MCHC: 32.5 g/dL (ref 30.0–36.0)
MCV: 95.2 fL (ref 80.0–100.0)
Monocytes Absolute: 0.2 10*3/uL (ref 0.1–1.0)
Monocytes Relative: 6 %
Neutro Abs: 1.3 10*3/uL — ABNORMAL LOW (ref 1.7–7.7)
Neutrophils Relative %: 41 %
Platelet Count: 191 10*3/uL (ref 150–400)
RBC: 3.33 MIL/uL — ABNORMAL LOW (ref 4.22–5.81)
RDW: 15.5 % (ref 11.5–15.5)
WBC Count: 3.1 10*3/uL — ABNORMAL LOW (ref 4.0–10.5)
nRBC: 0 % (ref 0.0–0.2)

## 2019-02-23 LAB — TOTAL PROTEIN, URINE DIPSTICK: Protein, ur: NEGATIVE mg/dL

## 2019-02-23 MED ORDER — HEPARIN SOD (PORK) LOCK FLUSH 100 UNIT/ML IV SOLN
500.0000 [IU] | Freq: Once | INTRAVENOUS | Status: AC | PRN
  Administered 2019-02-23: 17:00:00 500 [IU]
  Filled 2019-02-23: qty 5

## 2019-02-23 MED ORDER — SODIUM CHLORIDE 0.9% FLUSH
10.0000 mL | Freq: Once | INTRAVENOUS | Status: AC
  Administered 2019-02-23: 10 mL
  Filled 2019-02-23: qty 10

## 2019-02-23 MED ORDER — SODIUM CHLORIDE 0.9 % IV SOLN
5.0000 mg/kg | Freq: Once | INTRAVENOUS | Status: AC
  Administered 2019-02-23: 400 mg via INTRAVENOUS
  Filled 2019-02-23: qty 16

## 2019-02-23 MED ORDER — INFLUENZA VAC A&B SA ADJ QUAD 0.5 ML IM PRSY
PREFILLED_SYRINGE | INTRAMUSCULAR | Status: AC
  Filled 2019-02-23: qty 0.5

## 2019-02-23 MED ORDER — PROCHLORPERAZINE MALEATE 10 MG PO TABS: ORAL_TABLET | ORAL | 2 refills | Status: DC

## 2019-02-23 MED ORDER — INFLUENZA VAC A&B SA ADJ QUAD 0.5 ML IM PRSY
0.5000 mL | PREFILLED_SYRINGE | Freq: Once | INTRAMUSCULAR | Status: AC
  Administered 2019-02-23: 0.5 mL via INTRAMUSCULAR

## 2019-02-23 MED ORDER — SODIUM CHLORIDE 0.9% FLUSH
10.0000 mL | INTRAVENOUS | Status: DC | PRN
  Administered 2019-02-23: 10 mL
  Filled 2019-02-23: qty 10

## 2019-02-23 MED ORDER — SODIUM CHLORIDE 0.9 % IV SOLN
Freq: Once | INTRAVENOUS | Status: AC
  Administered 2019-02-23: 16:00:00 via INTRAVENOUS
  Filled 2019-02-23: qty 250

## 2019-02-23 NOTE — Progress Notes (Addendum)
[place] OFFICE PROGRESS NOTE   Diagnosis: Rectal cancer  INTERVAL HISTORY:   Hunter Walker returns as scheduled.  He completed cycle 2 Lonsurf beginning 02/08/2019.  He began every 2-week Avastin 02/09/2019.  He denies significant nausea/vomiting.  No mouth sores.  Bowels are moving.  He notes less rectal pain.  He denies rectal bleeding.  No rash.  Main complaint is gas discomfort.  He is currently taking MiraLAX and Dulcolax.  Objective:  Vital signs in last 24 hours:  Blood pressure 120/84, pulse 79, temperature 98.7 F (37.1 C), temperature source Oral, resp. rate 18, height _0  (1.854 m), weight 178 lb 3.2 oz (80.8 kg), SpO2 100 %.    HEENT: No thrush or ulcers. GI: Abdomen soft and nontender.  No hepatomegaly.  No apparent ascites. Vascular: No leg edema.  Calves soft and nontender. Neuro: Alert and oriented. Skin: No rash. Port-A-Cath without erythema.   Lab Results:  Lab Results  Component Value Date   WBC 3.1 (L) 02/23/2019   HGB 10.3 (L) 02/23/2019   HCT 31.7 (L) 02/23/2019   MCV 95.2 02/23/2019   PLT 191 02/23/2019   NEUTROABS 1.3 (L) 02/23/2019    Imaging:  No results found.  Medications: I have reviewed the patient's current medications.  Assessment/Plan: 1.Rectal cancer, invasive adenocarcinoma confirmed at colonoscopy 05/22/2015, EUS staging 06/01/2015 revealed a uT3,N1 lesion at 7 cm from the anal verge  Staging CT abdomen/pelvis 05/15/2015 confirmed a mass at the rectosigmoid colon, a suspicious perirectal lymph node, and a 6 mm right liver lesion felt to most likely be a cyst  CT chest 05/30/2015 with indeterminate pulmonary nodules and multiple liver lesions suspicious for metastases  Ultrasound 06/13/2014 with no liver lesions identified  MRI abdomen 06/21/2015 consistent with multiple liver metastases  CT biopsy of liver lesion 06/26/2015. Pathology showed metastatic adenocarcinoma consistent with colonic  primary.MSI-stable, low mutation burden, no RAS or BRAFmutation  Cycle 1 FOLFOX 07/10/2015  Cycle 2 FOLFOX plus Avastin 07/24/2015 with Neulasta support  Cycle 3 FOLFOX plus Avastin 08/07/2015  Cycle 4 FOLFOX plus Avastin 08/21/2015  cycle 5 FOLFOX plus Avastin 09/04/2015  Restaging CTs 09/15/2015 revealed a decrease in the size of liver lesions, decreased rectal primary, stable/decreased size of tiny lung nodules  Cycle 6 FOLFOX plus Avastin 09/18/2015  Cycle 7 FOLFOX plus Avastin 10/02/2015  Cycle 8 FOLFOX plus Avastin 10/16/2015  Cycle 9 FOLFOX plus Avastin 11/06/2015  Cycle 10 FOLFOX plus Avastin 11/20/2015  CTs 12/07/2015-mild decrease in wall thickening at the rectum, decrease in tiny hepatic metastases, stable lung nodules, no evidence of progressive metastatic disease, new left lower lobe infiltrate  Treatment changed to every three-week Avastin with every other week Xeloda beginning 12/11/2015  Restaging CTs 04/12/2016-interval increase in size of adjacent right upper lobe pulmonary nodules and right lower lobe pulmonary nodule; unchanged 5 mm lesion right hepatic lobe; no additional visualized hepatic metastasis. Grossly unchanged wall thickening of the rectum.  Continuation of Xeloda every other week and every three-week AvastinXeloda placed on hold 05/06/2016 due to hand-foot syndrome  Xeloda resumed 05/28/2016 1500 mg every morning and 1000 mg every afternoon 7 days on/7 days off  CT 09/27/2016-enlargement of lung nodules and new liver lesions, stable rectal mass  Cycle 1 FOLFIRI/panitumumab 10/08/2016  Cycle 2 panitumumab 10/23/2016 (FOLFIRI held secondary to neutropenia)  Cycle 3 FOLFIRI/PANITUMUMAB 11/04/2016  Cycle 4 FOLFIRI/panitumumab 11/18/2016  Cycle 5 FOLFIRI/panitumumab 12/16/2016  Cycle 6 FOLFIRI/panitumumab 12/31/2016  CTs 01/16/2017-decreased size of pulmonary nodules and liver lesions.  Decreased perirectal lymph node  Cycle 7  FOLFIRI/PANITUMUMAB 01/28/2017  Cycle 8FOLFIRI/PANITUMUMAB 02/10/2017  Cycle 9 FOLFIRI/PANITUMUMAB 02/24/2017  Cycle 10 FOLFIRI/panitumumab 03/10/2017 (irinotecan held secondary to neutropenia)  Cycle 11 FOLFIRI/panitumumab 03/24/2017  Cycle12 FOLFIRI/Panitumumab 04/07/2017  Restaging CTs 04/25/2017-stable rectal mass with adjacent 8 mm left perirectal nodal metastasis. Prior hepatic metastases not visualized on the current study. Indeterminate 16 mm enhancing lesion present in segment 6, indeterminate. Minimal residual nodularity in the right upper lobe. No new/progressive pulmonary metastases.  Maintenance Xeloda/panitumumab 04/28/2017  Restaging CTs 08/18/2017-new and enlarging bilateral lung nodules; 2 hyperattenuating lesions in the liver appear similar to prior study; progression of wall thickening in the rectum with enlarging perirectal lymph nodes.  Initiation of radiation/Xeloda 09/01/2017, completed 09/22/2017  Cycle 1 FOLFIRI/Panitumumab 10/13/2017  Cycle 2 FOLFIRI 11/03/2017  Cycle 3 FOLFIRI 11/17/2017  Cycle 4 FOLFIRI 12/01/2017  Cycle 5 FOLFIRI 12/15/2017  CTs7/26/2019-mild progression lungs and liver; significantly improved rectal wall thickening; resolution of perirectal nodal adenopathy.  Cycle 6 FOLFIRI 12/29/2017  Cycle 7 FOLFIRI 01/12/2018  Cycle 8 FOLFIRI 01/26/2018  Cycle 9 FOLFIRI 02/09/2018  Cycle 10 FOLFIRI 02/23/2018  Restaging CTs 03/06/2018-majority of the pulmonary nodules are similar in size, at least 2 of the nodules are minimally increased in size. Similar appearing lesions within the liver. Similar-appearing mild wall thickening of the rectum.  Cycle 11FOLFIRI 03/09/2018  Cycle 12 FOLFIRI 03/23/2018  Cycle 13 FOLFIRI 04/06/2018  Cycle 14 FOLFIRI 04/20/2018  Cycle 15 FOLFIRI 05/04/2018  CT 05/15/2018-no change in pulmonary nodules, mild increase in size of 2 liver lesions, stable rectal wall thickening  On study at [facility], [place] with  palbociclib andUlixertinibbeginning 07/15/2018  [facility]  Restaging CTs 10/07/2018-enlargement of multiple lateral pulmonary nodules and liver lesions; redemonstrated circumferential thickening of the mid rectum with interval enlargement of an irregular nodule of the left perirectal fat.  Cycle 1 FOLFOX 10/12/2018  Cycle 2 FOLFOX 11/02/2018 (allergic reaction to oxaliplatin, oxaliplatin discontinued)  Guardant testing 11/30/2018-KRAS Q61H,EGFR amplification, MSI high not detected(NOTE:Plan for repeat guardant testing at a 75-monthinterval)  Cycle 1 Lonsurf 01/11/2019  Cycle 2 Lonsurf 02/08/2019  Every 2-week Avastin beginning 02/09/2019 2.Multiple colon polyps on the colonoscopy 05/22/2015, tubular villous adenoma and a tubular adenoma were removed 3.Anemia-likely secondary to rectal bleeding.  4.Family history of multiple cancers including breast and prostate cancer 5.Transient right arm numbness 06/20/2015 6. Port-A-Cath placement 07/06/2015 by Dr. TMarcello Moores7. Mild neutropenia secondary to chemotherapy following cycle 1 FOLFOX. Neulasta added with cycle 2. 8. Delayed nausea following chemotherapy-emend added with cycle 3 FOLFOX;Emend added with cycle 12 FOLFIRI. 9. Early oxaliplatin neuropathy 10. 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 Xeloda resumed at a reduced dose. 11. Bilateral hand weakness. Question related to previous oxaliplatin. Referred to neurology 06/17/2016. 12. Hypertension-amlodipine started 07/08/2016 13. Rash secondary to PANITUMUMAB.  14. Neutropenia following cycle 1 FOLFIRI/PANITUMUMAB; neutropenia following cycle 4 FOLFIRI/PANITUMUMAB.  Disposition: Mr. WPotempaappears stable.  He has completed 2 cycles of Lonsurf and is now receiving Avastin every 2 weeks.  Plan to proceed with a Avastin today as scheduled.  He is scheduled to begin the next cycle of Lonsurf following office visit 03/08/2019.  We reviewed  the labs from today, adequate to proceed with treatment.  We discussed the mild neutropenia.  He understands to contact the office with fever, chills, other signs of infection.  For the gas discomfort he will continue MiraLAX, discontinue Dulcolax and begin a stool softener.  We also discussed that he could try  over-the-counter Gas-X.  He will return for lab, follow-up, Avastin on 03/08/2019.  He will contact the office in the interim as outlined above or with any problems.  Patient seen with Dr. Benay Spice.  Ned Card ANP/GNP-BC   02/23/2019  2:47 PM This was a shared visit with Ned Card.  Mr. Mansfield appears to be tolerating the Lonsurf and Avastin well.  He will complete another treatment with Avastin today.  Julieanne Manson, MD

## 2019-02-23 NOTE — Patient Instructions (Signed)
[facility] Discharge Instructions for Patients Receiving Chemotherapy  Today you received the following chemotherapy agents Avastin  To help prevent nausea and vomiting after your treatment, we encourage you to take your nausea medication as directed   If you develop nausea and vomiting that is not controlled by your nausea medication, call the clinic.   BELOW ARE SYMPTOMS THAT SHOULD BE REPORTED IMMEDIATELY:  *FEVER GREATER THAN 100.5 F  *CHILLS WITH OR WITHOUT FEVER  NAUSEA AND VOMITING THAT IS NOT CONTROLLED WITH YOUR NAUSEA MEDICATION  *UNUSUAL SHORTNESS OF BREATH  *UNUSUAL BRUISING OR BLEEDING  TENDERNESS IN MOUTH AND THROAT WITH OR WITHOUT PRESENCE OF ULCERS  *URINARY PROBLEMS  *BOWEL PROBLEMS  UNUSUAL RASH Items with * indicate a potential emergency and should be followed up as soon as possible.  Feel free to call the clinic should you have any questions or concerns. The clinic phone number is [phone].  Please show the CHEMO ALERT CARD at check-in to the Emergency Department and triage nurse.   

## 2019-02-25 ENCOUNTER — Telehealth: Payer: Self-pay | Admitting: Nurse Practitioner

## 2019-02-25 NOTE — Telephone Encounter (Signed)
Called and left msg. Mailed printout  °

## 2019-03-07 ENCOUNTER — Other Ambulatory Visit: Payer: Self-pay | Admitting: Oncology

## 2019-03-09 ENCOUNTER — Inpatient Hospital Stay: Payer: Medicare HMO

## 2019-03-09 ENCOUNTER — Other Ambulatory Visit: Payer: Self-pay

## 2019-03-09 ENCOUNTER — Other Ambulatory Visit: Payer: Self-pay | Admitting: *Deleted

## 2019-03-09 ENCOUNTER — Inpatient Hospital Stay: Payer: Medicare HMO | Attending: Nurse Practitioner | Admitting: Oncology

## 2019-03-09 VITALS — BP 120/85 | HR 78 | Temp 98.5°F | Resp 17 | Ht 73.0 in | Wt 179.5 lb

## 2019-03-09 VITALS — BP 118/77 | HR 68 | Temp 98.3°F | Resp 16

## 2019-03-09 DIAGNOSIS — Z86010 Personal history of colonic polyps: Secondary | ICD-10-CM | POA: Insufficient documentation

## 2019-03-09 DIAGNOSIS — C20 Malignant neoplasm of rectum: Secondary | ICD-10-CM

## 2019-03-09 DIAGNOSIS — Z5112 Encounter for antineoplastic immunotherapy: Secondary | ICD-10-CM | POA: Diagnosis present

## 2019-03-09 DIAGNOSIS — Z8042 Family history of malignant neoplasm of prostate: Secondary | ICD-10-CM | POA: Insufficient documentation

## 2019-03-09 DIAGNOSIS — G620 Drug-induced polyneuropathy: Secondary | ICD-10-CM | POA: Insufficient documentation

## 2019-03-09 DIAGNOSIS — D701 Agranulocytosis secondary to cancer chemotherapy: Secondary | ICD-10-CM | POA: Insufficient documentation

## 2019-03-09 DIAGNOSIS — T451X5A Adverse effect of antineoplastic and immunosuppressive drugs, initial encounter: Secondary | ICD-10-CM | POA: Diagnosis not present

## 2019-03-09 DIAGNOSIS — R21 Rash and other nonspecific skin eruption: Secondary | ICD-10-CM | POA: Diagnosis not present

## 2019-03-09 DIAGNOSIS — R531 Weakness: Secondary | ICD-10-CM | POA: Insufficient documentation

## 2019-03-09 DIAGNOSIS — R918 Other nonspecific abnormal finding of lung field: Secondary | ICD-10-CM | POA: Insufficient documentation

## 2019-03-09 DIAGNOSIS — K625 Hemorrhage of anus and rectum: Secondary | ICD-10-CM | POA: Insufficient documentation

## 2019-03-09 DIAGNOSIS — Z803 Family history of malignant neoplasm of breast: Secondary | ICD-10-CM | POA: Diagnosis not present

## 2019-03-09 DIAGNOSIS — C787 Secondary malignant neoplasm of liver and intrahepatic bile duct: Secondary | ICD-10-CM | POA: Insufficient documentation

## 2019-03-09 DIAGNOSIS — D709 Neutropenia, unspecified: Secondary | ICD-10-CM | POA: Insufficient documentation

## 2019-03-09 DIAGNOSIS — Z95828 Presence of other vascular implants and grafts: Secondary | ICD-10-CM

## 2019-03-09 DIAGNOSIS — R160 Hepatomegaly, not elsewhere classified: Secondary | ICD-10-CM | POA: Insufficient documentation

## 2019-03-09 DIAGNOSIS — D500 Iron deficiency anemia secondary to blood loss (chronic): Secondary | ICD-10-CM | POA: Insufficient documentation

## 2019-03-09 LAB — CMP (CANCER CENTER ONLY)
ALT: 10 U/L (ref 0–44)
AST: 18 U/L (ref 15–41)
Albumin: 3.6 g/dL (ref 3.5–5.0)
Alkaline Phosphatase: 93 U/L (ref 38–126)
Anion gap: 7 (ref 5–15)
BUN: 14 mg/dL (ref 8–23)
CO2: 25 mmol/L (ref 22–32)
Calcium: 9.1 mg/dL (ref 8.9–10.3)
Chloride: 106 mmol/L (ref 98–111)
Creatinine: 1.04 mg/dL (ref 0.61–1.24)
GFR, Est AFR Am: >60 mL/min (ref >60)
GFR, Estimated: >60 mL/min (ref >60)
Glucose, Bld: 116 mg/dL — ABNORMAL HIGH (ref 70–99)
Potassium: 4.2 mmol/L (ref 3.5–5.1)
Sodium: 138 mmol/L (ref 135–145)
Total Bilirubin: 0.4 mg/dL (ref 0.3–1.2)
Total Protein: 7.9 g/dL (ref 6.5–8.1)

## 2019-03-09 LAB — CBC WITH DIFFERENTIAL (CANCER CENTER ONLY)
Abs Immature Granulocytes: 0.01 10*3/uL (ref 0.00–0.07)
Basophils Absolute: 0 10*3/uL (ref 0.0–0.1)
Basophils Relative: 0 %
Eosinophils Absolute: 0 10*3/uL (ref 0.0–0.5)
Eosinophils Relative: 1 %
HCT: 33.1 % — ABNORMAL LOW (ref 39.0–52.0)
Hemoglobin: 10.7 g/dL — ABNORMAL LOW (ref 13.0–17.0)
Immature Granulocytes: 0 %
Lymphocytes Relative: 45 %
Lymphs Abs: 1.3 10*3/uL (ref 0.7–4.0)
MCH: 31.9 pg (ref 26.0–34.0)
MCHC: 32.3 g/dL (ref 30.0–36.0)
MCV: 98.8 fL (ref 80.0–100.0)
Monocytes Absolute: 0.3 10*3/uL (ref 0.1–1.0)
Monocytes Relative: 12 %
Neutro Abs: 1.2 10*3/uL — ABNORMAL LOW (ref 1.7–7.7)
Neutrophils Relative %: 42 %
Platelet Count: 212 10*3/uL (ref 150–400)
RBC: 3.35 MIL/uL — ABNORMAL LOW (ref 4.22–5.81)
RDW: 19.8 % — ABNORMAL HIGH (ref 11.5–15.5)
WBC Count: 3 10*3/uL — ABNORMAL LOW (ref 4.0–10.5)
nRBC: 0 % (ref 0.0–0.2)

## 2019-03-09 LAB — CEA (IN HOUSE-CHCC): CEA (CHCC-In House): 233.3 ng/mL — ABNORMAL HIGH (ref 0.00–5.00)

## 2019-03-09 LAB — TOTAL PROTEIN, URINE DIPSTICK: Protein, ur: NEGATIVE mg/dL

## 2019-03-09 MED ORDER — SODIUM CHLORIDE 0.9 % IV SOLN
Freq: Once | INTRAVENOUS | Status: AC
  Administered 2019-03-09: 14:00:00 via INTRAVENOUS
  Filled 2019-03-09: qty 250

## 2019-03-09 MED ORDER — SODIUM CHLORIDE 0.9% FLUSH
10.0000 mL | Freq: Once | INTRAVENOUS | Status: AC
  Administered 2019-03-09: 10 mL via INTRAVENOUS
  Filled 2019-03-09: qty 10

## 2019-03-09 MED ORDER — SODIUM CHLORIDE 0.9% FLUSH
10.0000 mL | Freq: Once | INTRAVENOUS | Status: AC
  Administered 2019-03-09: 10 mL
  Filled 2019-03-09: qty 10

## 2019-03-09 MED ORDER — LONSURF 20-8.19 MG PO TABS: 20.0000 mg/m2 | ORAL_TABLET | Freq: Two times a day (BID) | ORAL | 0 refills | Status: DC

## 2019-03-09 MED ORDER — SODIUM CHLORIDE 0.9 % IV SOLN
5.0000 mg/kg | Freq: Once | INTRAVENOUS | Status: AC
  Administered 2019-03-09: 400 mg via INTRAVENOUS
  Filled 2019-03-09: qty 16

## 2019-03-09 MED ORDER — HEPARIN SOD (PORK) LOCK FLUSH 100 UNIT/ML IV SOLN
500.0000 [IU] | Freq: Once | INTRAVENOUS | Status: AC
  Administered 2019-03-09: 15:00:00 500 [IU] via INTRAVENOUS
  Filled 2019-03-09: qty 5

## 2019-03-09 NOTE — Progress Notes (Signed)
Per Dr. Benay Spice: OK to treat with ANC 1.3

## 2019-03-09 NOTE — Progress Notes (Signed)
[facility] OFFICE PROGRESS NOTE   Diagnosis: Rectal cancer  INTERVAL HISTORY:   Hunter Walker returns as scheduled.  He began another cycle of Lonsurf yesterday.  No mouth sores or diarrhea.  He has rectal pain only.  No bleeding.  Objective:  Vital signs in last 24 hours:  Blood pressure 120/85, pulse 78, temperature 98.5 F (36.9 C), temperature source Temporal, resp. rate 17, height _0  (1.854 m), weight 179 lb 8 oz (81.4 kg), SpO2 100 %.    HEENT: No thrush or ulcers GI: No hepatomegaly, no mass, nontender Vascular: No leg edema   Portacath/PICC-without erythema  Lab Results:  Lab Results  Component Value Date   WBC 3.0 (L) 03/09/2019   HGB 10.7 (L) 03/09/2019   HCT 33.1 (L) 03/09/2019   MCV 98.8 03/09/2019   PLT 212 03/09/2019   NEUTROABS 1.2 (L) 03/09/2019    CMP  Lab Results  Component Value Date   NA 138 03/09/2019   K 4.2 03/09/2019   CL 106 03/09/2019   CO2 25 03/09/2019   GLUCOSE 116 (H) 03/09/2019   BUN 14 03/09/2019   CREATININE 1.04 03/09/2019   CALCIUM 9.1 03/09/2019   PROT 7.9 03/09/2019   ALBUMIN 3.6 03/09/2019   AST 18 03/09/2019   ALT 10 03/09/2019   ALKPHOS 93 03/09/2019   BILITOT 0.4 03/09/2019   GFRNONAA >60 03/09/2019   GFRAA >60 03/09/2019    Lab Results  Component Value Date   CEA1 233.30 (H) 03/09/2019     Medications: I have reviewed the patient's current medications.   Assessment/Plan: 1.Rectal cancer, invasive adenocarcinoma confirmed at colonoscopy 05/22/2015, EUS staging 06/01/2015 revealed a uT3,N1 lesion at 7 cm from the anal verge  Staging CT abdomen/pelvis 05/15/2015 confirmed a mass at the rectosigmoid colon, a suspicious perirectal lymph node, and a 6 mm right liver lesion felt to most likely be a cyst  CT chest 05/30/2015 with indeterminate pulmonary nodules and multiple liver lesions suspicious for metastases  Ultrasound 06/13/2014 with no liver lesions identified  MRI abdomen  06/21/2015 consistent with multiple liver metastases  CT biopsy of liver lesion 06/26/2015. Pathology showed metastatic adenocarcinoma consistent with colonic primary.MSI-stable, low mutation burden, no RAS or BRAFmutation  Cycle 1 FOLFOX 07/10/2015  Cycle 2 FOLFOX plus Avastin 07/24/2015 with Neulasta support  Cycle 3 FOLFOX plus Avastin 08/07/2015  Cycle 4 FOLFOX plus Avastin 08/21/2015  cycle 5 FOLFOX plus Avastin 09/04/2015  Restaging CTs 09/15/2015 revealed a decrease in the size of liver lesions, decreased rectal primary, stable/decreased size of tiny lung nodules  Cycle 6 FOLFOX plus Avastin 09/18/2015  Cycle 7 FOLFOX plus Avastin 10/02/2015  Cycle 8 FOLFOX plus Avastin 10/16/2015  Cycle 9 FOLFOX plus Avastin 11/06/2015  Cycle 10 FOLFOX plus Avastin 11/20/2015  CTs 12/07/2015-mild decrease in wall thickening at the rectum, decrease in tiny hepatic metastases, stable lung nodules, no evidence of progressive metastatic disease, new left lower lobe infiltrate  Treatment changed to every three-week Avastin with every other week Xeloda beginning 12/11/2015  Restaging CTs 04/12/2016-interval increase in size of adjacent right upper lobe pulmonary nodules and right lower lobe pulmonary nodule; unchanged 5 mm lesion right hepatic lobe; no additional visualized hepatic metastasis. Grossly unchanged wall thickening of the rectum.  Continuation of Xeloda every other week and every three-week AvastinXeloda placed on hold 05/06/2016 due to hand-foot syndrome  Xeloda resumed 05/28/2016 1500 mg every morning and 1000 mg every afternoon 7 days on/7 days off  CT 09/27/2016-enlargement of lung nodules  and new liver lesions, stable rectal mass  Cycle 1 FOLFIRI/panitumumab 10/08/2016  Cycle 2 panitumumab 10/23/2016 (FOLFIRI held secondary to neutropenia)  Cycle 3 FOLFIRI/PANITUMUMAB 11/04/2016  Cycle 4 FOLFIRI/panitumumab 11/18/2016  Cycle 5 FOLFIRI/panitumumab 12/16/2016  Cycle  6 FOLFIRI/panitumumab 12/31/2016  CTs 01/16/2017-decreased size of pulmonary nodules and liver lesions. Decreased perirectal lymph node  Cycle 7 FOLFIRI/PANITUMUMAB 01/28/2017  Cycle 8FOLFIRI/PANITUMUMAB 02/10/2017  Cycle 9 FOLFIRI/PANITUMUMAB 02/24/2017  Cycle 10 FOLFIRI/panitumumab 03/10/2017 (irinotecan held secondary to neutropenia)  Cycle 11 FOLFIRI/panitumumab 03/24/2017  Cycle12 FOLFIRI/Panitumumab 04/07/2017  Restaging CTs 04/25/2017-stable rectal mass with adjacent 8 mm left perirectal nodal metastasis. Prior hepatic metastases not visualized on the current study. Indeterminate 16 mm enhancing lesion present in segment 6, indeterminate. Minimal residual nodularity in the right upper lobe. No new/progressive pulmonary metastases.  Maintenance Xeloda/panitumumab 04/28/2017  Restaging CTs 08/18/2017-new and enlarging bilateral lung nodules; 2 hyperattenuating lesions in the liver appear similar to prior study; progression of wall thickening in the rectum with enlarging perirectal lymph nodes.  Initiation of radiation/Xeloda 09/01/2017, completed 09/22/2017  Cycle 1 FOLFIRI/Panitumumab 10/13/2017  Cycle 2 FOLFIRI 11/03/2017  Cycle 3 FOLFIRI 11/17/2017  Cycle 4 FOLFIRI 12/01/2017  Cycle 5 FOLFIRI 12/15/2017  CTs7/26/2019-mild progression lungs and liver; significantly improved rectal wall thickening; resolution of perirectal nodal adenopathy.  Cycle 6 FOLFIRI 12/29/2017  Cycle 7 FOLFIRI 01/12/2018  Cycle 8 FOLFIRI 01/26/2018  Cycle 9 FOLFIRI 02/09/2018  Cycle 10 FOLFIRI 02/23/2018  Restaging CTs 03/06/2018-majority of the pulmonary nodules are similar in size, at least 2 of the nodules are minimally increased in size. Similar appearing lesions within the liver. Similar-appearing mild wall thickening of the rectum.  Cycle 11FOLFIRI 03/09/2018  Cycle 12 FOLFIRI 03/23/2018  Cycle 13 FOLFIRI 04/06/2018  Cycle 14 FOLFIRI 04/20/2018  Cycle 15 FOLFIRI 05/04/2018  CT  05/15/2018-no change in pulmonary nodules, mild increase in size of 2 liver lesions, stable rectal wall thickening  On study at [facility] [place] with palbociclib andUlixertinibbeginning 07/15/2018  [facility]  Restaging CTs 10/07/2018-enlargement of multiple lateral pulmonary nodules and liver lesions; redemonstrated circumferential thickening of the mid rectum with interval enlargement of an irregular nodule of the left perirectal fat.  Cycle 1 FOLFOX 10/12/2018  Cycle 2 FOLFOX 11/02/2018 (allergic reaction to oxaliplatin, oxaliplatin discontinued)  Guardant testing 11/30/2018-KRAS Q61H,EGFR amplification, MSI high not detected(NOTE:Plan for repeat guardant testing at a 66-monthinterval)  Cycle 1 Lonsurf 01/11/2019  Cycle 2 Lonsurf 02/08/2019, dose reduced  Every 2-week Avastin beginning 02/09/2019  Cycle 3 Lonsurf 03/08/2019 2.Multiple colon polyps on the colonoscopy 05/22/2015, tubular villous adenoma and a tubular adenoma were removed 3.Anemia-likely secondary to rectal bleeding.  4.Family history of multiple cancers including breast and prostate cancer 5.Transient right arm numbness 06/20/2015 6. Port-A-Cath placement 07/06/2015 by Dr. TMarcello Moores7. Mild neutropenia secondary to chemotherapy following cycle 1 FOLFOX. Neulasta added with cycle 2. 8. Delayed nausea following chemotherapy-emend added with cycle 3 FOLFOX;Emend added with cycle 12 FOLFIRI. 9. Early oxaliplatin neuropathy 10. 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 Xeloda resumed at a reduced dose. 11. Bilateral hand weakness. Question related to previous oxaliplatin. Referred to neurology 06/17/2016. 12. Hypertension-amlodipine started 07/08/2016 13. Rash secondary to PANITUMUMAB.  14. Neutropenia following cycle 1 FOLFIRI/PANITUMUMAB; neutropenia following cycle 4 FOLFIRI/PANITUMUMAB.    Disposition: Mr. WBudlongappears stable.  He will complete another treatment  with Avastin today.  He is completing cycle 3 Lonsurf.  He has mild neutropenia.  He will call for a fever or symptoms of infection.  He will return  for a CBC in 1 week.  Hunter Walker will be scheduled for an office visit in 2 weeks.  The CEA is lower today.  The plan is to continue Lonsurf with a restaging CT after cycle 4.  Betsy Coder, MD  03/09/2019  2:11 PM

## 2019-03-09 NOTE — Patient Instructions (Signed)
[facility] Discharge Instructions for Patients Receiving Chemotherapy  Today you received the following chemotherapy agents Bevacizumab  To help prevent nausea and vomiting after your treatment, we encourage you to take your nausea medication as directed by your MD.   If you develop nausea and vomiting that is not controlled by your nausea medication, call the clinic.   BELOW ARE SYMPTOMS THAT SHOULD BE REPORTED IMMEDIATELY:  *FEVER GREATER THAN 100.5 F  *CHILLS WITH OR WITHOUT FEVER  NAUSEA AND VOMITING THAT IS NOT CONTROLLED WITH YOUR NAUSEA MEDICATION  *UNUSUAL SHORTNESS OF BREATH  *UNUSUAL BRUISING OR BLEEDING  TENDERNESS IN MOUTH AND THROAT WITH OR WITHOUT PRESENCE OF ULCERS  *URINARY PROBLEMS  *BOWEL PROBLEMS  UNUSUAL RASH Items with * indicate a potential emergency and should be followed up as soon as possible.  Feel free to call the clinic should you have any questions or concerns. The clinic phone number is [phone].  Please show the [facility] at check-in to the Emergency Department and triage nurse. Coronavirus (COVID-19) Are you at risk?  Are you at risk for the Coronavirus (COVID-19)?  To be considered HIGH RISK for Coronavirus (COVID-19), you have to meet the following criteria:  . Traveled to [place], [place], [place], [place] or [place]; or in the [place] to [place], [place], [place], or [place]; and have fever, cough, and shortness of breath within the last 2 weeks of travel OR . Been in close contact with a person diagnosed with COVID-19 within the last 2 weeks and have fever, cough, and shortness of breath . IF YOU DO NOT MEET THESE CRITERIA, YOU ARE CONSIDERED LOW RISK FOR COVID-19.  What to do if you are HIGH RISK for COVID-19?  Marland Kitchen If you are having a medical emergency, call 911. . Seek medical care right away. Before you go to a doctor's office, urgent care or emergency department, call ahead and tell them about  your recent travel, contact with someone diagnosed with COVID-19, and your symptoms. You should receive instructions from your physician's office regarding next steps of care.  . When you arrive at healthcare provider, tell the healthcare staff immediately you have returned from visiting [place], [place], [place], [place] or [place]; or traveled in the [place] to [place], [place], [place], or [place]; in the last two weeks or you have been in close contact with a person diagnosed with COVID-19 in the last 2 weeks.   . Tell the health care staff about your symptoms: fever, cough and shortness of breath. . After you have been seen by a medical provider, you will be either: o Tested for (COVID-19) and discharged home on quarantine except to seek medical care if symptoms worsen, and asked to  - Stay home and avoid contact with others until you get your results (4-5 days)  - Avoid travel on public transportation if possible (such as bus, train, or airplane) or o Sent to the Emergency Department by EMS for evaluation, COVID-19 testing, and possible admission depending on your condition and test results.  What to do if you are LOW RISK for COVID-19?  Reduce your risk of any infection by using the same precautions used for avoiding the common cold or flu:  Marland Kitchen Wash your hands often with soap and warm water for at least 20 seconds.  If soap and water are not readily available, use an alcohol-based hand sanitizer with at least 60% alcohol.  . If coughing  or sneezing, cover your mouth and nose by coughing or sneezing into the elbow areas of your shirt or coat, into a tissue or into your sleeve (not your hands). . Avoid shaking hands with others and consider head nods or verbal greetings only. . Avoid touching your eyes, nose, or mouth with unwashed hands.  . Avoid close contact with people who are sick. . Avoid places or events with large numbers of people in one location, like concerts or sporting  events. . Carefully consider travel plans you have or are making. . If you are planning any travel outside or inside the [place], visit the CDC's Travelers' Health webpage for the latest health notices. . If you have some symptoms but not all symptoms, continue to monitor at home and seek medical attention if your symptoms worsen. . If you are having a medical emergency, call 911.   ADDITIONAL HEALTHCARE OPTIONS FOR PATIENTS  [facility] / e-Visit: [email or web address]         [facility]: [phone]  [facility]: [phone]                   [facility]: [phone]    

## 2019-03-10 ENCOUNTER — Telehealth: Payer: Self-pay | Admitting: Oncology

## 2019-03-10 NOTE — Telephone Encounter (Signed)
Scheduled per los. Called and spoke with patient. Confirmed appts  

## 2019-03-11 ENCOUNTER — Other Ambulatory Visit: Payer: Self-pay | Admitting: *Deleted

## 2019-03-11 DIAGNOSIS — C20 Malignant neoplasm of rectum: Secondary | ICD-10-CM

## 2019-03-11 MED ORDER — LONSURF 20-8.19 MG PO TABS: 20.0000 mg/m2 | ORAL_TABLET | Freq: Two times a day (BID) | ORAL | 0 refills | Status: DC

## 2019-03-11 NOTE — Progress Notes (Signed)
Request from [facility] that they need new script for Lonsurf. This was already sent on 10/6, but reordered today as requested.

## 2019-03-16 ENCOUNTER — Other Ambulatory Visit: Payer: Self-pay

## 2019-03-16 ENCOUNTER — Inpatient Hospital Stay: Payer: Medicare HMO

## 2019-03-16 DIAGNOSIS — K625 Hemorrhage of anus and rectum: Secondary | ICD-10-CM | POA: Diagnosis not present

## 2019-03-16 DIAGNOSIS — D500 Iron deficiency anemia secondary to blood loss (chronic): Secondary | ICD-10-CM | POA: Diagnosis not present

## 2019-03-16 DIAGNOSIS — G620 Drug-induced polyneuropathy: Secondary | ICD-10-CM | POA: Diagnosis not present

## 2019-03-16 DIAGNOSIS — R160 Hepatomegaly, not elsewhere classified: Secondary | ICD-10-CM | POA: Diagnosis not present

## 2019-03-16 DIAGNOSIS — R918 Other nonspecific abnormal finding of lung field: Secondary | ICD-10-CM | POA: Diagnosis not present

## 2019-03-16 DIAGNOSIS — C787 Secondary malignant neoplasm of liver and intrahepatic bile duct: Secondary | ICD-10-CM | POA: Diagnosis not present

## 2019-03-16 DIAGNOSIS — C20 Malignant neoplasm of rectum: Secondary | ICD-10-CM

## 2019-03-16 DIAGNOSIS — D701 Agranulocytosis secondary to cancer chemotherapy: Secondary | ICD-10-CM | POA: Diagnosis not present

## 2019-03-16 DIAGNOSIS — T451X5A Adverse effect of antineoplastic and immunosuppressive drugs, initial encounter: Secondary | ICD-10-CM | POA: Diagnosis not present

## 2019-03-16 DIAGNOSIS — Z95828 Presence of other vascular implants and grafts: Secondary | ICD-10-CM

## 2019-03-16 LAB — CBC WITH DIFFERENTIAL (CANCER CENTER ONLY)
Abs Immature Granulocytes: 0.01 10*3/uL (ref 0.00–0.07)
Basophils Absolute: 0 10*3/uL (ref 0.0–0.1)
Basophils Relative: 0 %
Eosinophils Absolute: 0 10*3/uL (ref 0.0–0.5)
Eosinophils Relative: 0 %
HCT: 30.8 % — ABNORMAL LOW (ref 39.0–52.0)
Hemoglobin: 10.1 g/dL — ABNORMAL LOW (ref 13.0–17.0)
Immature Granulocytes: 0 %
Lymphocytes Relative: 38 %
Lymphs Abs: 1.1 10*3/uL (ref 0.7–4.0)
MCH: 32.4 pg (ref 26.0–34.0)
MCHC: 32.8 g/dL (ref 30.0–36.0)
MCV: 98.7 fL (ref 80.0–100.0)
Monocytes Absolute: 0.2 10*3/uL (ref 0.1–1.0)
Monocytes Relative: 6 %
Neutro Abs: 1.7 10*3/uL (ref 1.7–7.7)
Neutrophils Relative %: 56 %
Platelet Count: 171 10*3/uL (ref 150–400)
RBC: 3.12 MIL/uL — ABNORMAL LOW (ref 4.22–5.81)
RDW: 19.3 % — ABNORMAL HIGH (ref 11.5–15.5)
WBC Count: 3 10*3/uL — ABNORMAL LOW (ref 4.0–10.5)
nRBC: 0 % (ref 0.0–0.2)

## 2019-03-16 MED ORDER — HEPARIN SOD (PORK) LOCK FLUSH 100 UNIT/ML IV SOLN
500.0000 [IU] | Freq: Once | INTRAVENOUS | Status: AC
  Administered 2019-03-16: 11:00:00 500 [IU]
  Filled 2019-03-16: qty 5

## 2019-03-16 MED ORDER — SODIUM CHLORIDE 0.9% FLUSH
10.0000 mL | Freq: Once | INTRAVENOUS | Status: AC
  Administered 2019-03-16: 11:00:00 10 mL
  Filled 2019-03-16: qty 10

## 2019-03-17 NOTE — Progress Notes (Unsigned)
Patient will continue Avastin d/t drug replacement for off-label use.   Demetrius Charity, PharmD, [facility] Pharmacist Pharmacy Phone: [phone] 03/17/2019

## 2019-03-21 ENCOUNTER — Other Ambulatory Visit: Payer: Self-pay | Admitting: Oncology

## 2019-03-23 ENCOUNTER — Inpatient Hospital Stay: Payer: Medicare HMO

## 2019-03-23 ENCOUNTER — Other Ambulatory Visit: Payer: Self-pay | Admitting: Nurse Practitioner

## 2019-03-23 ENCOUNTER — Telehealth: Payer: Self-pay | Admitting: *Deleted

## 2019-03-23 ENCOUNTER — Inpatient Hospital Stay: Payer: Medicare HMO | Admitting: Nurse Practitioner

## 2019-03-23 DIAGNOSIS — C20 Malignant neoplasm of rectum: Secondary | ICD-10-CM

## 2019-03-23 MED ORDER — HYDROCODONE-ACETAMINOPHEN 5-325 MG PO TABS: 1.0000 | ORAL_TABLET | Freq: Four times a day (QID) | ORAL | 0 refills | Status: DC | PRN

## 2019-03-23 NOTE — Telephone Encounter (Addendum)
Patient called to cancel his appointment today. Awoke with abdominal pain and rectal pain. Reports some constipation as well and is working on getting his bowels to move today w/Miralax and stool softener. No N/V. Has a headache today and some chills, but denies fever. Is able to drink liquids easily, but does not want to eat at present. He agrees to get bowels moving, and push fluids to call if he develops a fever. He confirmed he is on his 2 week break on Lonsurf currently. Wishes to reschedule his appointments to next week, Tuesday if possible. MD notified. Scheduling message sent.

## 2019-03-24 ENCOUNTER — Telehealth: Payer: Self-pay | Admitting: *Deleted

## 2019-03-24 NOTE — Telephone Encounter (Signed)
Patient reports he is no longer constipated. No chills or fever. Feeling better than yesterday. Drinking adequate fluids and starting to eat small amounts again. Appreciates the nurse checking on him.

## 2019-03-25 ENCOUNTER — Telehealth: Payer: Self-pay | Admitting: Oncology

## 2019-03-25 NOTE — Telephone Encounter (Signed)
Scheduled appt per 10/20 sch message - pt is aware of appt date and time

## 2019-03-27 ENCOUNTER — Other Ambulatory Visit: Payer: Self-pay | Admitting: Nurse Practitioner

## 2019-03-27 DIAGNOSIS — C20 Malignant neoplasm of rectum: Secondary | ICD-10-CM

## 2019-03-30 ENCOUNTER — Inpatient Hospital Stay: Payer: Medicare HMO

## 2019-03-30 ENCOUNTER — Other Ambulatory Visit: Payer: Self-pay

## 2019-03-30 ENCOUNTER — Inpatient Hospital Stay (HOSPITAL_BASED_OUTPATIENT_CLINIC_OR_DEPARTMENT_OTHER): Payer: Medicare HMO | Admitting: Oncology

## 2019-03-30 VITALS — BP 131/79 | HR 70 | Temp 98.7°F | Resp 18 | Ht 73.0 in | Wt 178.8 lb

## 2019-03-30 VITALS — BP 125/70

## 2019-03-30 DIAGNOSIS — R160 Hepatomegaly, not elsewhere classified: Secondary | ICD-10-CM | POA: Diagnosis not present

## 2019-03-30 DIAGNOSIS — T451X5A Adverse effect of antineoplastic and immunosuppressive drugs, initial encounter: Secondary | ICD-10-CM | POA: Diagnosis not present

## 2019-03-30 DIAGNOSIS — G620 Drug-induced polyneuropathy: Secondary | ICD-10-CM | POA: Diagnosis not present

## 2019-03-30 DIAGNOSIS — C20 Malignant neoplasm of rectum: Secondary | ICD-10-CM

## 2019-03-30 DIAGNOSIS — D500 Iron deficiency anemia secondary to blood loss (chronic): Secondary | ICD-10-CM | POA: Diagnosis not present

## 2019-03-30 DIAGNOSIS — C787 Secondary malignant neoplasm of liver and intrahepatic bile duct: Secondary | ICD-10-CM | POA: Diagnosis not present

## 2019-03-30 DIAGNOSIS — K625 Hemorrhage of anus and rectum: Secondary | ICD-10-CM | POA: Diagnosis not present

## 2019-03-30 DIAGNOSIS — Z95828 Presence of other vascular implants and grafts: Secondary | ICD-10-CM

## 2019-03-30 DIAGNOSIS — D701 Agranulocytosis secondary to cancer chemotherapy: Secondary | ICD-10-CM | POA: Diagnosis not present

## 2019-03-30 DIAGNOSIS — R918 Other nonspecific abnormal finding of lung field: Secondary | ICD-10-CM | POA: Diagnosis not present

## 2019-03-30 LAB — CBC WITH DIFFERENTIAL (CANCER CENTER ONLY)
Abs Immature Granulocytes: 0 10*3/uL (ref 0.00–0.07)
Basophils Absolute: 0 10*3/uL (ref 0.0–0.1)
Basophils Relative: 0 %
Eosinophils Absolute: 0 10*3/uL (ref 0.0–0.5)
Eosinophils Relative: 1 %
HCT: 28.6 % — ABNORMAL LOW (ref 39.0–52.0)
Hemoglobin: 9.3 g/dL — ABNORMAL LOW (ref 13.0–17.0)
Lymphocytes Relative: 67 %
Lymphs Abs: 1.4 10*3/uL (ref 0.7–4.0)
MCH: 32.9 pg (ref 26.0–34.0)
MCHC: 32.5 g/dL (ref 30.0–36.0)
MCV: 101.1 fL — ABNORMAL HIGH (ref 80.0–100.0)
Monocytes Absolute: 0.1 10*3/uL (ref 0.1–1.0)
Monocytes Relative: 4 %
Neutro Abs: 0.6 10*3/uL — ABNORMAL LOW (ref 1.7–7.7)
Neutrophils Relative %: 28 %
Platelet Count: 161 10*3/uL (ref 150–400)
RBC: 2.83 MIL/uL — ABNORMAL LOW (ref 4.22–5.81)
RDW: 20.6 % — ABNORMAL HIGH (ref 11.5–15.5)
WBC Count: 2.1 10*3/uL — ABNORMAL LOW (ref 4.0–10.5)
nRBC: 0 % (ref 0.0–0.2)

## 2019-03-30 MED ORDER — SODIUM CHLORIDE 0.9% FLUSH
10.0000 mL | Freq: Once | INTRAVENOUS | Status: AC
  Administered 2019-03-30: 10 mL
  Filled 2019-03-30: qty 10

## 2019-03-30 MED ORDER — SODIUM CHLORIDE 0.9% FLUSH
10.0000 mL | INTRAVENOUS | Status: DC | PRN
  Administered 2019-03-30: 10 mL
  Filled 2019-03-30: qty 10

## 2019-03-30 MED ORDER — HEPARIN SOD (PORK) LOCK FLUSH 100 UNIT/ML IV SOLN
500.0000 [IU] | Freq: Once | INTRAVENOUS | Status: AC | PRN
  Administered 2019-03-30: 500 [IU]
  Filled 2019-03-30: qty 5

## 2019-03-30 MED ORDER — SODIUM CHLORIDE 0.9 % IV SOLN
5.0000 mg/kg | Freq: Once | INTRAVENOUS | Status: AC
  Administered 2019-03-30: 400 mg via INTRAVENOUS
  Filled 2019-03-30: qty 16

## 2019-03-30 MED ORDER — SODIUM CHLORIDE 0.9 % IV SOLN
Freq: Once | INTRAVENOUS | Status: AC
  Administered 2019-03-30: 16:00:00 via INTRAVENOUS
  Filled 2019-03-30: qty 250

## 2019-03-30 NOTE — Patient Instructions (Signed)

## 2019-03-30 NOTE — Progress Notes (Signed)
Ok to treat with labs today per MD Benay Spice

## 2019-03-30 NOTE — Patient Instructions (Signed)
[facility] Discharge Instructions for Patients Receiving Chemotherapy  Today you received the following chemotherapy agents Bevacizumab  To help prevent nausea and vomiting after your treatment, we encourage you to take your nausea medication as directed by your MD.   If you develop nausea and vomiting that is not controlled by your nausea medication, call the clinic.   BELOW ARE SYMPTOMS THAT SHOULD BE REPORTED IMMEDIATELY:  *FEVER GREATER THAN 100.5 F  *CHILLS WITH OR WITHOUT FEVER  NAUSEA AND VOMITING THAT IS NOT CONTROLLED WITH YOUR NAUSEA MEDICATION  *UNUSUAL SHORTNESS OF BREATH  *UNUSUAL BRUISING OR BLEEDING  TENDERNESS IN MOUTH AND THROAT WITH OR WITHOUT PRESENCE OF ULCERS  *URINARY PROBLEMS  *BOWEL PROBLEMS  UNUSUAL RASH Items with * indicate a potential emergency and should be followed up as soon as possible.  Feel free to call the clinic should you have any questions or concerns. The clinic phone number is [phone].  Please show the [facility] at check-in to the Emergency Department and triage nurse. Coronavirus (COVID-19) Are you at risk?  Are you at risk for the Coronavirus (COVID-19)?  To be considered HIGH RISK for Coronavirus (COVID-19), you have to meet the following criteria:  . Traveled to [place], [place], [place], [place] or [place]; or in the [place] to [place], [place], [place], or [place]; and have fever, cough, and shortness of breath within the last 2 weeks of travel OR . Been in close contact with a person diagnosed with COVID-19 within the last 2 weeks and have fever, cough, and shortness of breath . IF YOU DO NOT MEET THESE CRITERIA, YOU ARE CONSIDERED LOW RISK FOR COVID-19.  What to do if you are HIGH RISK for COVID-19?  Marland Kitchen If you are having a medical emergency, call 911. . Seek medical care right away. Before you go to a doctor's office, urgent care or emergency department, call ahead and tell them about  your recent travel, contact with someone diagnosed with COVID-19, and your symptoms. You should receive instructions from your physician's office regarding next steps of care.  . When you arrive at healthcare provider, tell the healthcare staff immediately you have returned from visiting [place], [place], [place], [place] or [place]; or traveled in the [place] to [place], [place], [place], or [place]; in the last two weeks or you have been in close contact with a person diagnosed with COVID-19 in the last 2 weeks.   . Tell the health care staff about your symptoms: fever, cough and shortness of breath. . After you have been seen by a medical provider, you will be either: o Tested for (COVID-19) and discharged home on quarantine except to seek medical care if symptoms worsen, and asked to  - Stay home and avoid contact with others until you get your results (4-5 days)  - Avoid travel on public transportation if possible (such as bus, train, or airplane) or o Sent to the Emergency Department by EMS for evaluation, COVID-19 testing, and possible admission depending on your condition and test results.  What to do if you are LOW RISK for COVID-19?  Reduce your risk of any infection by using the same precautions used for avoiding the common cold or flu:  Marland Kitchen Wash your hands often with soap and warm water for at least 20 seconds.  If soap and water are not readily available, use an alcohol-based hand sanitizer with at least 60% alcohol.  . If coughing  or sneezing, cover your mouth and nose by coughing or sneezing into the elbow areas of your shirt or coat, into a tissue or into your sleeve (not your hands). . Avoid shaking hands with others and consider head nods or verbal greetings only. . Avoid touching your eyes, nose, or mouth with unwashed hands.  . Avoid close contact with people who are sick. . Avoid places or events with large numbers of people in one location, like concerts or sporting  events. . Carefully consider travel plans you have or are making. . If you are planning any travel outside or inside the [place], visit the CDC's Travelers' Health webpage for the latest health notices. . If you have some symptoms but not all symptoms, continue to monitor at home and seek medical attention if your symptoms worsen. . If you are having a medical emergency, call 911.   ADDITIONAL HEALTHCARE OPTIONS FOR PATIENTS  [facility] / e-Visit: [email or web address]         [facility]: [phone]  [facility]: [phone]                   [facility]: [phone]    

## 2019-03-30 NOTE — Progress Notes (Signed)
[facility] OFFICE PROGRESS NOTE   Diagnosis: Rectal cancer  INTERVAL HISTORY:   Hunter Walker returns for a scheduled visit.  He completed another cycle of Lonsurf beginning 03/08/2019.  Diarrhea.  He was scheduled for Avastin last week but had a "upset stomach ".  He feels well today.  He is having bowel movements.  He has occasional mild rectal bleeding and pain.  Objective:  Vital signs in last 24 hours:  Blood pressure 131/79, pulse 70, temperature 98.7 F (37.1 C), temperature source Temporal, resp. rate 18, height '6\' 1"'  (1.854 m), weight 178 lb 12.8 oz (81.1 kg), SpO2 100 %.     GI: No hepatomegaly, soft and nontender Vascular: No leg edema   Skin: Palms without erythema  Portacath/PICC-without erythema  Lab Results:  Lab Results  Component Value Date   WBC 2.1 (L) 03/30/2019   HGB 9.3 (L) 03/30/2019   HCT 28.6 (L) 03/30/2019   MCV 101.1 (H) 03/30/2019   PLT 161 03/30/2019   NEUTROABS 0.6 (L) 03/30/2019    CMP  Lab Results  Component Value Date   NA 138 03/09/2019   K 4.2 03/09/2019   CL 106 03/09/2019   CO2 25 03/09/2019   GLUCOSE 116 (H) 03/09/2019   BUN 14 03/09/2019   CREATININE 1.04 03/09/2019   CALCIUM 9.1 03/09/2019   PROT 7.9 03/09/2019   ALBUMIN 3.6 03/09/2019   AST 18 03/09/2019   ALT 10 03/09/2019   ALKPHOS 93 03/09/2019   BILITOT 0.4 03/09/2019   GFRNONAA >60 03/09/2019   GFRAA >60 03/09/2019    Lab Results  Component Value Date   CEA1 233.30 (H) 03/09/2019     Medications: I have reviewed the patient's current medications.   Assessment/Plan: 1.Rectal cancer, invasive adenocarcinoma confirmed at colonoscopy 05/22/2015, EUS staging 06/01/2015 revealed a uT3,N1 lesion at 7 cm from the anal verge  Staging CT abdomen/pelvis 05/15/2015 confirmed a mass at the rectosigmoid colon, a suspicious perirectal lymph node, and a 6 mm right liver lesion felt to most likely be a cyst  CT chest 05/30/2015 with indeterminate  pulmonary nodules and multiple liver lesions suspicious for metastases  Ultrasound 06/13/2014 with no liver lesions identified  MRI abdomen 06/21/2015 consistent with multiple liver metastases  CT biopsy of liver lesion 06/26/2015. Pathology showed metastatic adenocarcinoma consistent with colonic primary.MSI-stable, low mutation burden, no RAS or BRAFmutation  Cycle 1 FOLFOX 07/10/2015  Cycle 2 FOLFOX plus Avastin 07/24/2015 with Neulasta support  Cycle 3 FOLFOX plus Avastin 08/07/2015  Cycle 4 FOLFOX plus Avastin 08/21/2015  cycle 5 FOLFOX plus Avastin 09/04/2015  Restaging CTs 09/15/2015 revealed a decrease in the size of liver lesions, decreased rectal primary, stable/decreased size of tiny lung nodules  Cycle 6 FOLFOX plus Avastin 09/18/2015  Cycle 7 FOLFOX plus Avastin 10/02/2015  Cycle 8 FOLFOX plus Avastin 10/16/2015  Cycle 9 FOLFOX plus Avastin 11/06/2015  Cycle 10 FOLFOX plus Avastin 11/20/2015  CTs 12/07/2015-mild decrease in wall thickening at the rectum, decrease in tiny hepatic metastases, stable lung nodules, no evidence of progressive metastatic disease, new left lower lobe infiltrate  Treatment changed to every three-week Avastin with every other week Xeloda beginning 12/11/2015  Restaging CTs 04/12/2016-interval increase in size of adjacent right upper lobe pulmonary nodules and right lower lobe pulmonary nodule; unchanged 5 mm lesion right hepatic lobe; no additional visualized hepatic metastasis. Grossly unchanged wall thickening of the rectum.  Continuation of Xeloda every other week and every three-week AvastinXeloda placed on hold 05/06/2016 due to  hand-foot syndrome  Xeloda resumed 05/28/2016 1500 mg every morning and 1000 mg every afternoon 7 days on/7 days off  CT 09/27/2016-enlargement of lung nodules and new liver lesions, stable rectal mass  Cycle 1 FOLFIRI/panitumumab 10/08/2016  Cycle 2 panitumumab 10/23/2016 (FOLFIRI held secondary to  neutropenia)  Cycle 3 FOLFIRI/PANITUMUMAB 11/04/2016  Cycle 4 FOLFIRI/panitumumab 11/18/2016  Cycle 5 FOLFIRI/panitumumab 12/16/2016  Cycle 6 FOLFIRI/panitumumab 12/31/2016  CTs 01/16/2017-decreased size of pulmonary nodules and liver lesions. Decreased perirectal lymph node  Cycle 7 FOLFIRI/PANITUMUMAB 01/28/2017  Cycle 8FOLFIRI/PANITUMUMAB 02/10/2017  Cycle 9 FOLFIRI/PANITUMUMAB 02/24/2017  Cycle 10 FOLFIRI/panitumumab 03/10/2017 (irinotecan held secondary to neutropenia)  Cycle 11 FOLFIRI/panitumumab 03/24/2017  Cycle12 FOLFIRI/Panitumumab 04/07/2017  Restaging CTs 04/25/2017-stable rectal mass with adjacent 8 mm left perirectal nodal metastasis. Prior hepatic metastases not visualized on the current study. Indeterminate 16 mm enhancing lesion present in segment 6, indeterminate. Minimal residual nodularity in the right upper lobe. No new/progressive pulmonary metastases.  Maintenance Xeloda/panitumumab 04/28/2017  Restaging CTs 08/18/2017-new and enlarging bilateral lung nodules; 2 hyperattenuating lesions in the liver appear similar to prior study; progression of wall thickening in the rectum with enlarging perirectal lymph nodes.  Initiation of radiation/Xeloda 09/01/2017, completed 09/22/2017  Cycle 1 FOLFIRI/Panitumumab 10/13/2017  Cycle 2 FOLFIRI 11/03/2017  Cycle 3 FOLFIRI 11/17/2017  Cycle 4 FOLFIRI 12/01/2017  Cycle 5 FOLFIRI 12/15/2017  CTs7/26/2019-mild progression lungs and liver; significantly improved rectal wall thickening; resolution of perirectal nodal adenopathy.  Cycle 6 FOLFIRI 12/29/2017  Cycle 7 FOLFIRI 01/12/2018  Cycle 8 FOLFIRI 01/26/2018  Cycle 9 FOLFIRI 02/09/2018  Cycle 10 FOLFIRI 02/23/2018  Restaging CTs 03/06/2018-majority of the pulmonary nodules are similar in size, at least 2 of the nodules are minimally increased in size. Similar appearing lesions within the liver. Similar-appearing mild wall thickening of the rectum.  Cycle  11FOLFIRI 03/09/2018  Cycle 12 FOLFIRI 03/23/2018  Cycle 13 FOLFIRI 04/06/2018  Cycle 14 FOLFIRI 04/20/2018  Cycle 15 FOLFIRI 05/04/2018  CT 05/15/2018-no change in pulmonary nodules, mild increase in size of 2 liver lesions, stable rectal wall thickening  On study at [facility] [place] with palbociclib andUlixertinibbeginning 07/15/2018  [facility]  Restaging CTs 10/07/2018-enlargement of multiple lateral pulmonary nodules and liver lesions; redemonstrated circumferential thickening of the mid rectum with interval enlargement of an irregular nodule of the left perirectal fat.  Cycle 1 FOLFOX 10/12/2018  Cycle 2 FOLFOX 11/02/2018 (allergic reaction to oxaliplatin, oxaliplatin discontinued)  Guardant testing 11/30/2018-KRAS Q61H,EGFR amplification, MSI high not detected(NOTE:Plan for repeat guardant testing at a 43-monthinterval)  Cycle 1 Lonsurf 01/11/2019  Cycle 2 Lonsurf 02/08/2019, dose reduced  Every 2-week Avastin beginning 02/09/2019  Cycle 3 Lonsurf 03/08/2019  Cycle 4 Lonsurf 04/12/2019 2.Multiple colon polyps on the colonoscopy 05/22/2015, tubular villous adenoma and a tubular adenoma were removed 3.Anemia-likely secondary to rectal bleeding.  4.Family history of multiple cancers including breast and prostate cancer 5.Transient right arm numbness 06/20/2015 6. Port-A-Cath placement 07/06/2015 by Dr. TMarcello Moores7. Mild neutropenia secondary to chemotherapy following cycle 1 FOLFOX. Neulasta added with cycle 2. 8. Delayed nausea following chemotherapy-emend added with cycle 3 FOLFOX;Emend added with cycle 12 FOLFIRI. 9. Early oxaliplatin neuropathy 10. 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 Xeloda resumed at a reduced dose. 11. Bilateral hand weakness. Question related to previous oxaliplatin. Referred to neurology 06/17/2016. 12. Hypertension-amlodipine started 07/08/2016 13. Rash secondary to PANITUMUMAB.  14. Neutropenia  following cycle 1 FOLFIRI/PANITUMUMAB; neutropenia following cycle 4 FOLFIRI/PANITUMUMAB.  15.  Neutropenia secondary to Lonsurf-cycle 4 of Lonsurf delayed until 04/12/2019  Disposition: Hunter Walker appears stable.  The CEA was lowered on 03/09/2019.  He completed cycle 3 Lonsurf beginning 03/08/2019.  He has neutropenia today.  He will call for a fever or symptoms of an infection.  He is scheduled to begin the next cycle of Lonsurf on 04/05/2019.  This will be delayed until he returns for a repeat CBC and Avastin on 04/12/2019.  Hunter Walker will be scheduled for an office visit and Avastin on 04/27/2019.  We will dose reduce the l Lonsurf  if the white count has not recovered on 04/12/2019.  Betsy Coder, MD  03/30/2019  3:42 PM

## 2019-03-31 ENCOUNTER — Telehealth: Payer: Self-pay | Admitting: Oncology

## 2019-03-31 ENCOUNTER — Telehealth: Payer: Self-pay

## 2019-03-31 NOTE — Telephone Encounter (Signed)
I left a message regarding schedule  

## 2019-03-31 NOTE — Telephone Encounter (Signed)
Oral Oncology Patient Advocate Encounter  [place] patient assistance will expire on 06/03/19.  Met patient in [place] to complete a renewal application for [facility] Patient Support in an effort to reduce patient's out of pocket expense for Lonsurf to $0.    This application is complete and ready to be faxed when the renewal period starts.  Oral Oncology clinic will continue to follow.  [facility] Patient [place] Phone [phone] Fax [phone] 03/31/2019    8:17 AM

## 2019-04-05 ENCOUNTER — Other Ambulatory Visit: Payer: Self-pay | Admitting: Oncology

## 2019-04-05 ENCOUNTER — Encounter: Payer: Self-pay | Admitting: Oncology

## 2019-04-05 DIAGNOSIS — C20 Malignant neoplasm of rectum: Secondary | ICD-10-CM

## 2019-04-08 IMAGING — CT CT CHEST W/ CM
2 of 5 series · 12 of 36 positions shown, 15 images · IV contrast (OMNIPAQUE)
Comparison: 03/06/2018

CLINICAL DATA: Rectal cancer.

EXAM:
CT CHEST, ABDOMEN, AND PELVIS WITH CONTRAST
TECHNIQUE: Multidetector CT imaging of the chest, abdomen and pelvis was
performed following the standard protocol during bolus
administration of intravenous contrast.
CONTRAST:  100mL OMNIPAQUE IOHEXOL 300 MG/ML  SOLN

[Series 2: cap with · axial · 0.75mm/px · z∈[-669,-109]mm · 9 of 138 slices shown, 12 images]
[im 13/138  mediastinal]
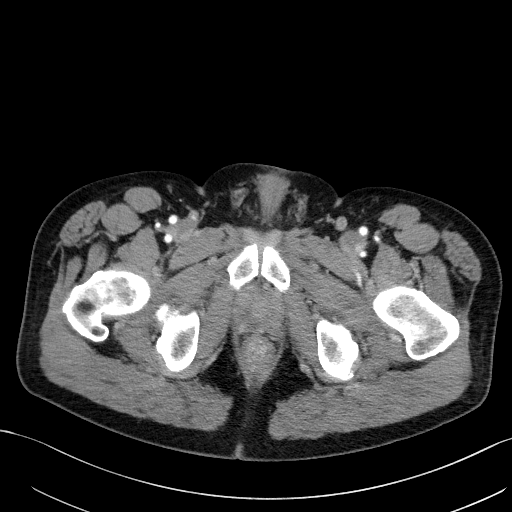
[im 13/138  lung]
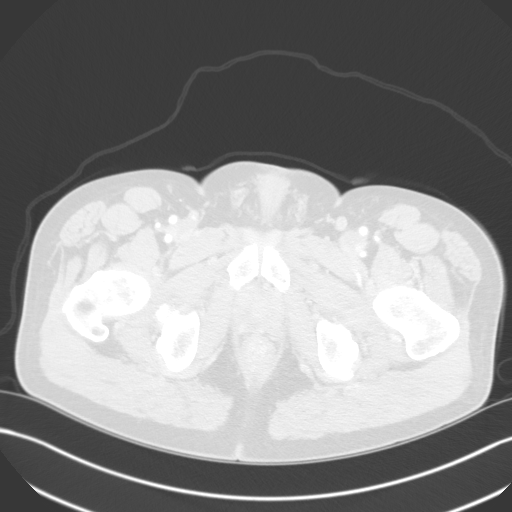
[im 25/138  lung]
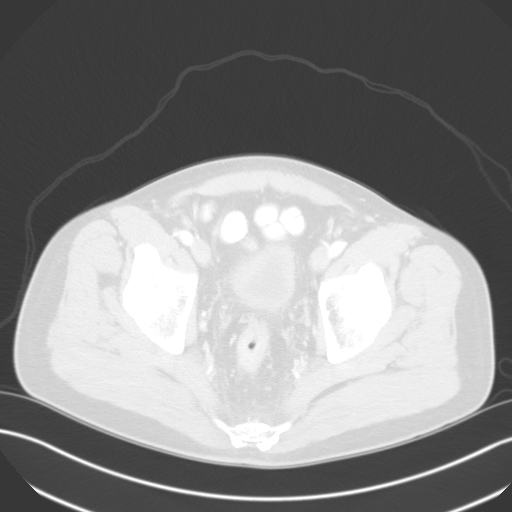
[im 38/138  lung]
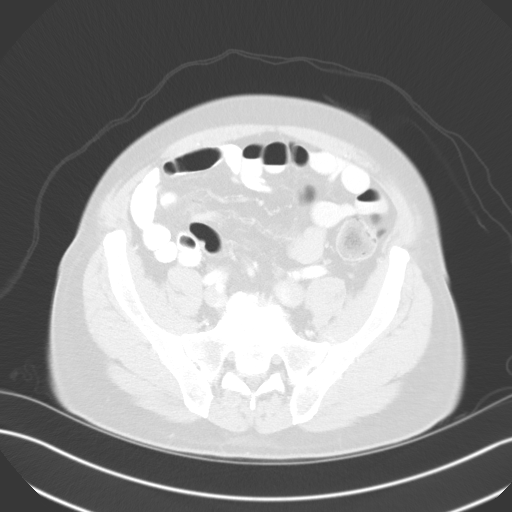
[im 50/138  lung]
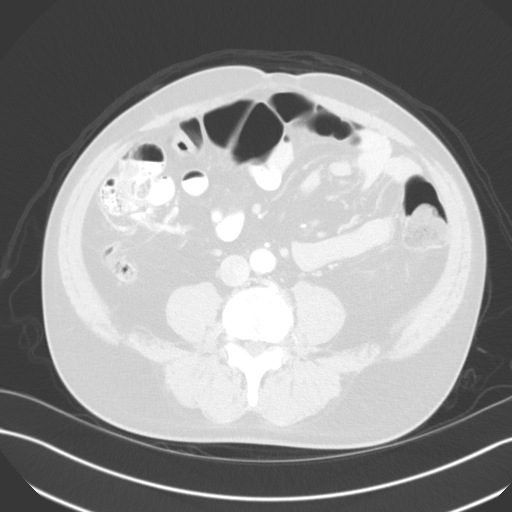
[im 75/138  mediastinal]
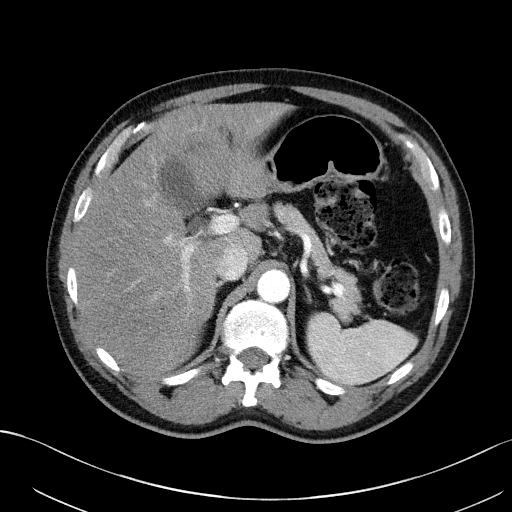
[im 75/138  lung]
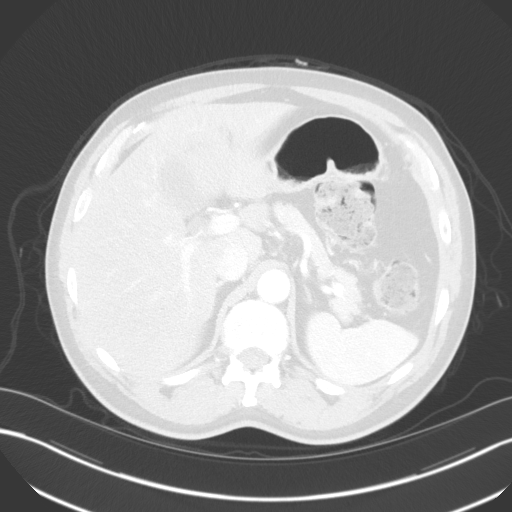
[im 88/138  lung]
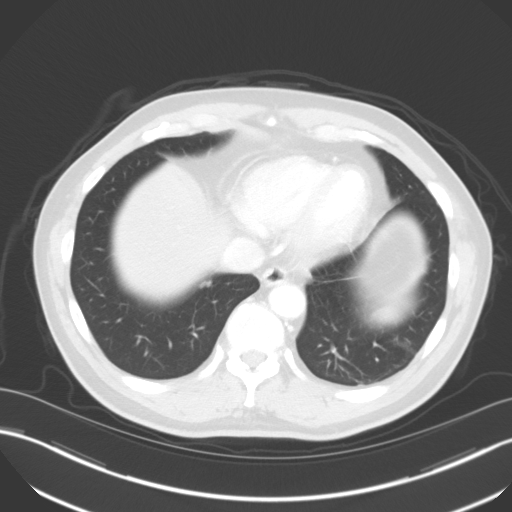
[im 100/138  lung]
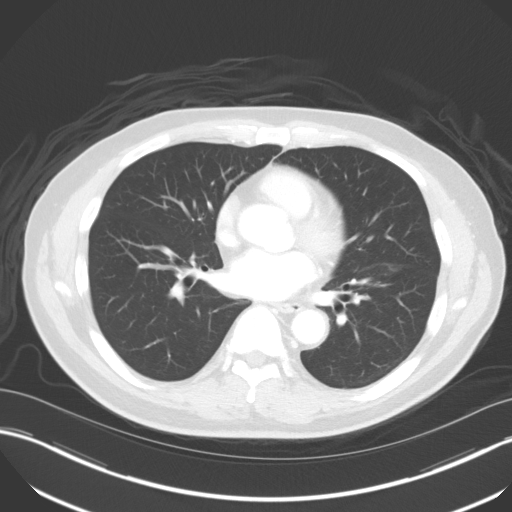
[im 113/138  lung]
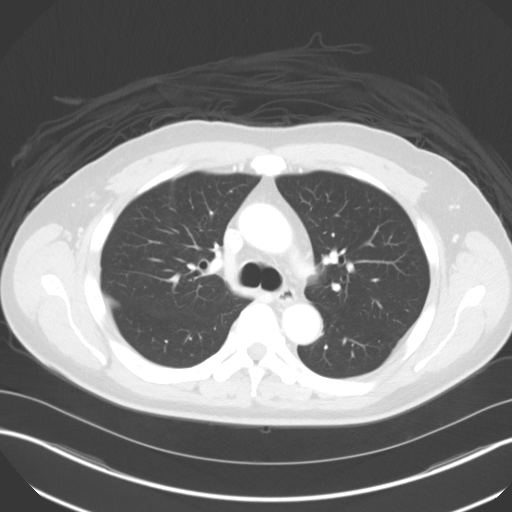
[im 125/138  mediastinal]
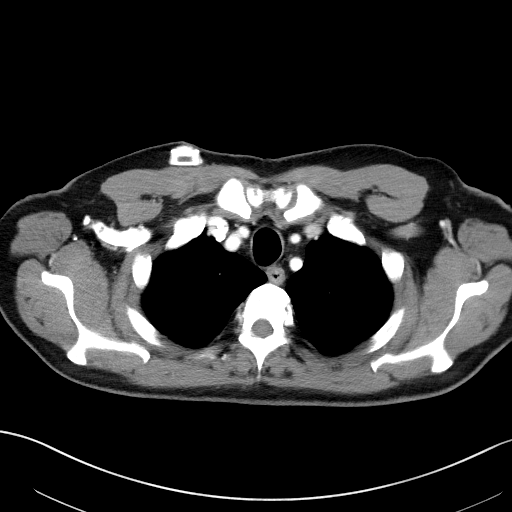
[im 125/138  lung]
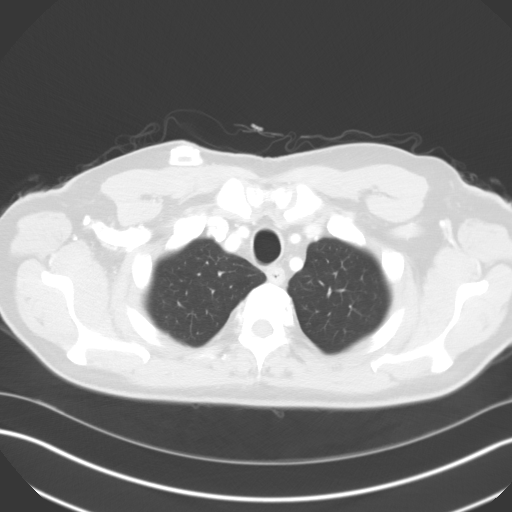

[Series 6: coronals · coronal · 0.81mm/px · 3 of 155 slices shown]
[im 31/155  lung]
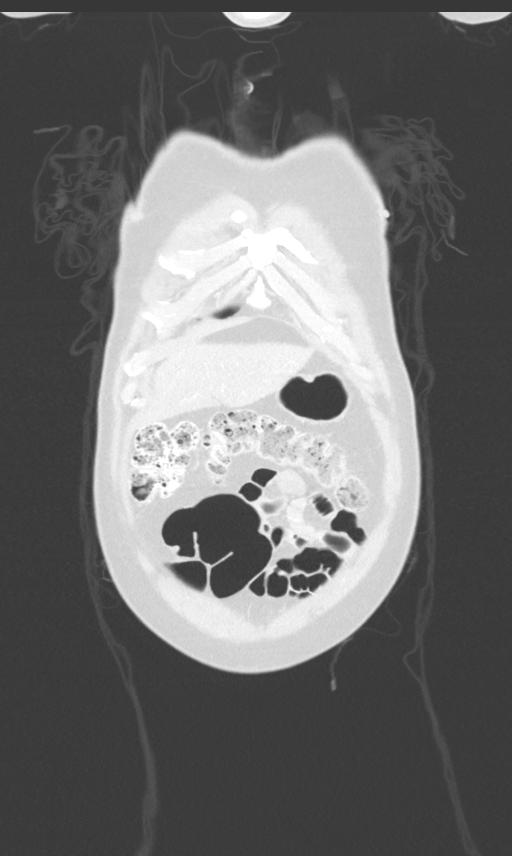
[im 62/155  lung]
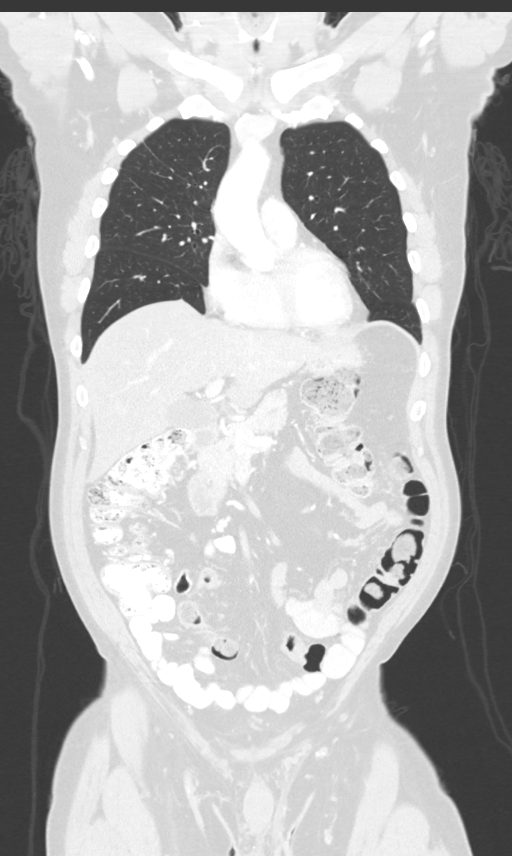
[im 93/155  lung]
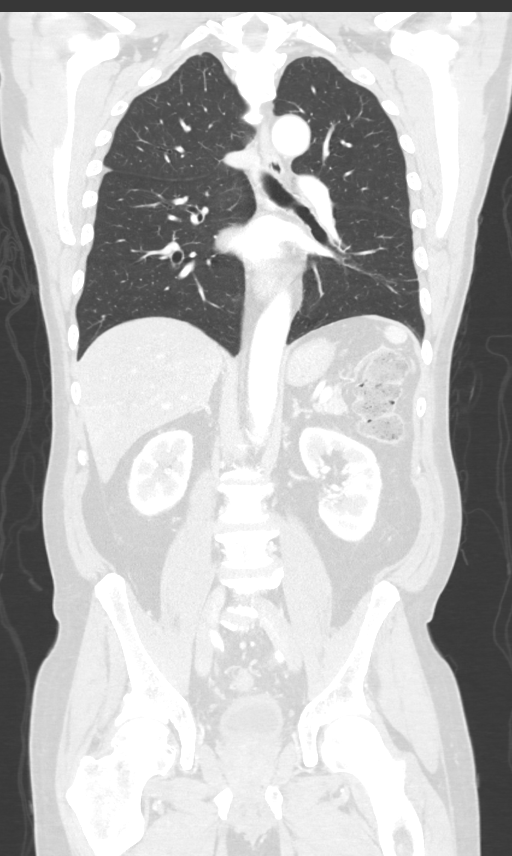

[12 of 36 positions shown; findings below may reference images not displayed]

FINDINGS: CT CHEST FINDINGS

Cardiovascular: The heart size is normal. No substantial pericardial
effusion. Atherosclerotic calcification is noted in the wall of the
thoracic aorta. Right Port-A-Cath tip positioned distal SVC.

Mediastinum/Nodes: No mediastinal lymphadenopathy. There is no hilar
lymphadenopathy. The esophagus has normal imaging features. There is
no axillary lymphadenopathy.

Lungs/Pleura: The central tracheobronchial airways are patent.
Bilateral pulmonary nodules again noted.

*Index right upper lobe nodule measured previously at 10 x 11 mm is
stable at 10 x 11 mm today (44/4).
*Index right lower lobe nodule measured previously at 8 mm is stable
at 8 mm today (105/4).
*Another index right lower lobe nodule measured previously at 7 mm
is stable at 7 mm today (113/4).
*Left lower lobe index nodule measured previously at 1.2 cm is
cm today.
*Additional scattered tiny bilateral pulmonary nodules are also
similar in the interval.

No pleural effusion on today's study. No focal airspace
consolidation.

Musculoskeletal: No worrisome lytic or sclerotic osseous
abnormality.

CT ABDOMEN PELVIS FINDINGS

Hepatobiliary:

*Rim enhancing lesion in the lateral segment left liver is mildly
progressed, measuring 3.8 x 3.4 cm today compared to 3.5 x 2.7 cm
previously.
*2nd lesion in the inferior right liver measures 2.7 x 2.9 cm today
(67/2) which compares to 2.1 x 2.3 cm previously.
*11 mm enhancing lesion identified in the anterior right liver
(65/2) is similar to prior.

There is no evidence for gallstones, gallbladder wall thickening, or
pericholecystic fluid. No intrahepatic or extrahepatic biliary
dilation.

Pancreas: No focal mass lesion. No dilatation of the main duct. No
intraparenchymal cyst. No peripancreatic edema.

Spleen: No splenomegaly. No focal mass lesion.

Adrenals/Urinary Tract: No adrenal nodule or mass. Kidneys
unremarkable. No evidence for hydroureter. The urinary bladder
appears normal for the degree of distention.

Stomach/Bowel: Stomach is nondistended. No gastric wall thickening.
No evidence of outlet obstruction. Duodenum is normally positioned
as is the ligament of Treitz. No small bowel wall thickening. No
small bowel dilatation. The terminal ileum is normal. Tip of the
appendix is dilated up to 14 mm with the lumen filled with gas and
debris (image 90/series 2). Appendiceal tip was 12 mm diameter
previously and gas filled. No periappendiceal edema or inflammation.
Circumferential wall thickening again noted in the rectum.

Vascular/Lymphatic: There is abdominal aortic atherosclerosis
without aneurysm. There is no gastrohepatic or hepatoduodenal
ligament lymphadenopathy. No intraperitoneal or retroperitoneal
lymphadenopathy. No pelvic sidewall lymphadenopathy. 6 x 13 mm left
perirectal nodule (111/2) is mildly more conspicuous than on the
prior study.

Reproductive: Prostate gland is enlarged.

Other: No intraperitoneal free fluid.

Musculoskeletal: Mixed lytic and sclerotic 2.1 cm lesion in the
right femoral head (121/2) is stable in the interval. No other
suspicious lytic or sclerotic osseous abnormality identified..
IMPRESSION: 1. No substantial interval change in bilateral pulmonary nodules. 1
particular nodule in the left base measures minimally increased in
the interval, but the remaining index nodules are unchanged.
2. Mild interval increase in size of 2 index metastases within the
liver. A third 11 mm hypervascular lesion in the right liver is
stable.
3. Similar circumferential rectal wall thickening with 6 x 13 mm
left perirectal nodule slightly more conspicuous than on the prior
study. Close attention on follow-up recommended as metastatic
disease a concern.
4. Dilated appendix measuring up to 14 mm diameter today compared to
12 mm previously. Appendix is filled with gas and debris in shows no
periappendiceal edema or inflammation. Attention on follow-up
recommended.

## 2019-04-11 ENCOUNTER — Other Ambulatory Visit: Payer: Self-pay | Admitting: Oncology

## 2019-04-12 ENCOUNTER — Inpatient Hospital Stay: Payer: Medicare HMO

## 2019-04-12 ENCOUNTER — Other Ambulatory Visit: Payer: Self-pay

## 2019-04-12 ENCOUNTER — Inpatient Hospital Stay: Payer: Medicare HMO | Attending: Nurse Practitioner

## 2019-04-12 ENCOUNTER — Telehealth: Payer: Self-pay | Admitting: *Deleted

## 2019-04-12 VITALS — BP 120/78 | HR 69 | Temp 98.5°F | Resp 18

## 2019-04-12 DIAGNOSIS — Z5112 Encounter for antineoplastic immunotherapy: Secondary | ICD-10-CM | POA: Insufficient documentation

## 2019-04-12 DIAGNOSIS — Z8042 Family history of malignant neoplasm of prostate: Secondary | ICD-10-CM | POA: Insufficient documentation

## 2019-04-12 DIAGNOSIS — Z85048 Personal history of other malignant neoplasm of rectum, rectosigmoid junction, and anus: Secondary | ICD-10-CM | POA: Insufficient documentation

## 2019-04-12 DIAGNOSIS — Z86010 Personal history of colonic polyps: Secondary | ICD-10-CM | POA: Diagnosis not present

## 2019-04-12 DIAGNOSIS — G620 Drug-induced polyneuropathy: Secondary | ICD-10-CM | POA: Insufficient documentation

## 2019-04-12 DIAGNOSIS — T451X5A Adverse effect of antineoplastic and immunosuppressive drugs, initial encounter: Secondary | ICD-10-CM | POA: Diagnosis not present

## 2019-04-12 DIAGNOSIS — Z803 Family history of malignant neoplasm of breast: Secondary | ICD-10-CM | POA: Insufficient documentation

## 2019-04-12 DIAGNOSIS — D649 Anemia, unspecified: Secondary | ICD-10-CM | POA: Insufficient documentation

## 2019-04-12 DIAGNOSIS — R160 Hepatomegaly, not elsewhere classified: Secondary | ICD-10-CM | POA: Insufficient documentation

## 2019-04-12 DIAGNOSIS — R918 Other nonspecific abnormal finding of lung field: Secondary | ICD-10-CM | POA: Insufficient documentation

## 2019-04-12 DIAGNOSIS — R21 Rash and other nonspecific skin eruption: Secondary | ICD-10-CM | POA: Insufficient documentation

## 2019-04-12 DIAGNOSIS — C20 Malignant neoplasm of rectum: Secondary | ICD-10-CM

## 2019-04-12 DIAGNOSIS — C787 Secondary malignant neoplasm of liver and intrahepatic bile duct: Secondary | ICD-10-CM | POA: Insufficient documentation

## 2019-04-12 DIAGNOSIS — I10 Essential (primary) hypertension: Secondary | ICD-10-CM | POA: Diagnosis not present

## 2019-04-12 DIAGNOSIS — Z79899 Other long term (current) drug therapy: Secondary | ICD-10-CM | POA: Insufficient documentation

## 2019-04-12 DIAGNOSIS — D701 Agranulocytosis secondary to cancer chemotherapy: Secondary | ICD-10-CM | POA: Diagnosis not present

## 2019-04-12 DIAGNOSIS — R531 Weakness: Secondary | ICD-10-CM | POA: Insufficient documentation

## 2019-04-12 DIAGNOSIS — Z95828 Presence of other vascular implants and grafts: Secondary | ICD-10-CM

## 2019-04-12 LAB — CBC WITH DIFFERENTIAL (CANCER CENTER ONLY)
Abs Immature Granulocytes: 0.01 10*3/uL (ref 0.00–0.07)
Basophils Absolute: 0 10*3/uL (ref 0.0–0.1)
Basophils Relative: 1 %
Eosinophils Absolute: 0 10*3/uL (ref 0.0–0.5)
Eosinophils Relative: 0 %
HCT: 31.9 % — ABNORMAL LOW (ref 39.0–52.0)
Hemoglobin: 10.3 g/dL — ABNORMAL LOW (ref 13.0–17.0)
Immature Granulocytes: 0 %
Lymphocytes Relative: 40 %
Lymphs Abs: 1.7 10*3/uL (ref 0.7–4.0)
MCH: 33.6 pg (ref 26.0–34.0)
MCHC: 32.3 g/dL (ref 30.0–36.0)
MCV: 103.9 fL — ABNORMAL HIGH (ref 80.0–100.0)
Monocytes Absolute: 0.4 10*3/uL (ref 0.1–1.0)
Monocytes Relative: 10 %
Neutro Abs: 2.1 10*3/uL (ref 1.7–7.7)
Neutrophils Relative %: 49 %
Platelet Count: 207 10*3/uL (ref 150–400)
RBC: 3.07 MIL/uL — ABNORMAL LOW (ref 4.22–5.81)
RDW: 21.2 % — ABNORMAL HIGH (ref 11.5–15.5)
WBC Count: 4.2 10*3/uL (ref 4.0–10.5)
nRBC: 0 % (ref 0.0–0.2)

## 2019-04-12 LAB — TOTAL PROTEIN, URINE DIPSTICK: Protein, ur: NEGATIVE mg/dL

## 2019-04-12 MED ORDER — SODIUM CHLORIDE 0.9 % IV SOLN
5.0000 mg/kg | Freq: Once | INTRAVENOUS | Status: AC
  Administered 2019-04-12: 400 mg via INTRAVENOUS
  Filled 2019-04-12: qty 16

## 2019-04-12 MED ORDER — SODIUM CHLORIDE 0.9% FLUSH
10.0000 mL | INTRAVENOUS | Status: DC | PRN
  Administered 2019-04-12: 14:00:00 10 mL
  Filled 2019-04-12: qty 10

## 2019-04-12 MED ORDER — HEPARIN SOD (PORK) LOCK FLUSH 100 UNIT/ML IV SOLN
500.0000 [IU] | Freq: Once | INTRAVENOUS | Status: AC | PRN
  Administered 2019-04-12: 500 [IU]
  Filled 2019-04-12: qty 5

## 2019-04-12 MED ORDER — SODIUM CHLORIDE 0.9% FLUSH
10.0000 mL | Freq: Once | INTRAVENOUS | Status: AC
  Administered 2019-04-12: 10 mL
  Filled 2019-04-12: qty 10

## 2019-04-12 MED ORDER — SODIUM CHLORIDE 0.9 % IV SOLN
Freq: Once | INTRAVENOUS | Status: AC
  Administered 2019-04-12: 13:00:00 via INTRAVENOUS
  Filled 2019-04-12: qty 250

## 2019-04-12 NOTE — Telephone Encounter (Signed)
-----   Message from Ladell Pier, MD sent at 04/12/2019  3:04 PM EST ----- Please call patient, neutrophil count is now normal, okay to begin next cycle of Lonsurf today

## 2019-04-12 NOTE — Patient Instructions (Signed)
[facility] Discharge Instructions for Patients Receiving Chemotherapy  Today you received the following chemotherapy agents Bevacizumab  To help prevent nausea and vomiting after your treatment, we encourage you to take your nausea medication as directed by your MD.   If you develop nausea and vomiting that is not controlled by your nausea medication, call the clinic.   BELOW ARE SYMPTOMS THAT SHOULD BE REPORTED IMMEDIATELY:  *FEVER GREATER THAN 100.5 F  *CHILLS WITH OR WITHOUT FEVER  NAUSEA AND VOMITING THAT IS NOT CONTROLLED WITH YOUR NAUSEA MEDICATION  *UNUSUAL SHORTNESS OF BREATH  *UNUSUAL BRUISING OR BLEEDING  TENDERNESS IN MOUTH AND THROAT WITH OR WITHOUT PRESENCE OF ULCERS  *URINARY PROBLEMS  *BOWEL PROBLEMS  UNUSUAL RASH Items with * indicate a potential emergency and should be followed up as soon as possible.  Feel free to call the clinic should you have any questions or concerns. The clinic phone number is [phone].  Please show the [facility] at check-in to the Emergency Department and triage nurse. Coronavirus (COVID-19) Are you at risk?  Are you at risk for the Coronavirus (COVID-19)?  To be considered HIGH RISK for Coronavirus (COVID-19), you have to meet the following criteria:  . Traveled to [place], [place], [place], [place] or [place]; or in the [place] to [place], [place], [place], or [place]; and have fever, cough, and shortness of breath within the last 2 weeks of travel OR . Been in close contact with a person diagnosed with COVID-19 within the last 2 weeks and have fever, cough, and shortness of breath . IF YOU DO NOT MEET THESE CRITERIA, YOU ARE CONSIDERED LOW RISK FOR COVID-19.  What to do if you are HIGH RISK for COVID-19?  Marland Kitchen If you are having a medical emergency, call 911. . Seek medical care right away. Before you go to a doctor's office, urgent care or emergency department, call ahead and tell them about  your recent travel, contact with someone diagnosed with COVID-19, and your symptoms. You should receive instructions from your physician's office regarding next steps of care.  . When you arrive at healthcare provider, tell the healthcare staff immediately you have returned from visiting [place], [place], [place], [place] or [place]; or traveled in the [place] to [place], [place], [place], or [place]; in the last two weeks or you have been in close contact with a person diagnosed with COVID-19 in the last 2 weeks.   . Tell the health care staff about your symptoms: fever, cough and shortness of breath. . After you have been seen by a medical provider, you will be either: o Tested for (COVID-19) and discharged home on quarantine except to seek medical care if symptoms worsen, and asked to  - Stay home and avoid contact with others until you get your results (4-5 days)  - Avoid travel on public transportation if possible (such as bus, train, or airplane) or o Sent to the Emergency Department by EMS for evaluation, COVID-19 testing, and possible admission depending on your condition and test results.  What to do if you are LOW RISK for COVID-19?  Reduce your risk of any infection by using the same precautions used for avoiding the common cold or flu:  Marland Kitchen Wash your hands often with soap and warm water for at least 20 seconds.  If soap and water are not readily available, use an alcohol-based hand sanitizer with at least 60% alcohol.  . If coughing  or sneezing, cover your mouth and nose by coughing or sneezing into the elbow areas of your shirt or coat, into a tissue or into your sleeve (not your hands). . Avoid shaking hands with others and consider head nods or verbal greetings only. . Avoid touching your eyes, nose, or mouth with unwashed hands.  . Avoid close contact with people who are sick. . Avoid places or events with large numbers of people in one location, like concerts or sporting  events. . Carefully consider travel plans you have or are making. . If you are planning any travel outside or inside the [place], visit the CDC's Travelers' Health webpage for the latest health notices. . If you have some symptoms but not all symptoms, continue to monitor at home and seek medical attention if your symptoms worsen. . If you are having a medical emergency, call 911.   ADDITIONAL HEALTHCARE OPTIONS FOR PATIENTS  [facility] / e-Visit: [email or web address]         [facility]: [phone]  [facility]: [phone]                   [facility]: [phone]    

## 2019-04-12 NOTE — Telephone Encounter (Signed)
Per Dr. Benay Spice, called and made pt aware that neutrophil count is normal and it's okay to begin next cycle of Lonsurf today. Pt verbalized understanding.

## 2019-04-12 NOTE — Progress Notes (Signed)
Per pt on 11/7 when he "wiped" after a bowel movement he saw a small amount of red blood on the toilet tissue. Pt verbalizes that he thinks it is from "straining and hemorrhoids" pt stated  he is taking stool softners and drinking plenty of water. Dr Benay Spice notified. No new orders.

## 2019-04-13 ENCOUNTER — Telehealth: Payer: Self-pay | Admitting: Pharmacist

## 2019-04-13 NOTE — Telephone Encounter (Signed)
Oral Oncology Pharmacist Encounter  Received voicemail from [facility] patient assistance program requesting treatment status of patient's Lonsurf. Per chart notes patient had been on hold due to low counts and was cleared to restart Lonsurf on 04/12/2019. I returned call to patient assistance program at [phone] and reported above information to their oncology nursing support.  Johny Drilling, PharmD, BCPS, BCOP  04/13/2019 11:42 AM Oral Oncology Clinic [phone]

## 2019-04-19 ENCOUNTER — Other Ambulatory Visit: Payer: Self-pay | Admitting: Oncology

## 2019-04-19 DIAGNOSIS — C20 Malignant neoplasm of rectum: Secondary | ICD-10-CM

## 2019-04-22 ENCOUNTER — Other Ambulatory Visit: Payer: Self-pay

## 2019-04-22 ENCOUNTER — Ambulatory Visit (INDEPENDENT_AMBULATORY_CARE_PROVIDER_SITE_OTHER): Payer: Medicare HMO | Admitting: Internal Medicine

## 2019-04-22 ENCOUNTER — Encounter: Payer: Self-pay | Admitting: Internal Medicine

## 2019-04-22 DIAGNOSIS — T451X5A Adverse effect of antineoplastic and immunosuppressive drugs, initial encounter: Secondary | ICD-10-CM

## 2019-04-22 DIAGNOSIS — I10 Essential (primary) hypertension: Secondary | ICD-10-CM

## 2019-04-22 DIAGNOSIS — G620 Drug-induced polyneuropathy: Secondary | ICD-10-CM | POA: Diagnosis not present

## 2019-04-22 NOTE — Assessment & Plan Note (Signed)
BP at goal and no hypotension. Refill amlodipine as needed.

## 2019-04-22 NOTE — Assessment & Plan Note (Signed)
Stable at this time with gabapentin. Still doing chemo currently.

## 2019-04-22 NOTE — Progress Notes (Signed)
Virtual Visit via Video Note  I connected with Hunter Walker on 04/22/19 at  9:00 AM EST by a video enabled telemedicine application and verified that I am speaking with the correct person using two identifiers.  The patient and the provider were at separate locations throughout the entire encounter.   I discussed the limitations of evaluation and management by telemedicine and the availability of in person appointments. The patient expressed understanding and agreed to proceed. The patient and the provider were the only parties present for the visit unless noted in HPI below.  History of Present Illness: The patient is a 68 y.o. man with visit for follow up HTN and chemo induced neuropathy. Denies side effects of amlodipine. BP has been at goal during infusions and at home. Denies fevers or chills. Having neuropathy in his feet and this is still bad. He has changed chemo since our last visit and this is causing some GI problems. He is under treatment for these. Denies chest pains or headaches. Overall it is stable.   Observations/Objective: Appearance: normal, breathing appears normal, casual grooming, abdomen does not appear distended, throat normal, memory normal, mental status is A and O times 3  Assessment and Plan: See problem oriented charting  Follow Up Instructions: refill meds as needed, no change to regimen today, labs reviewed with patient during visit  I discussed the assessment and treatment plan with the patient. The patient was provided an opportunity to ask questions and all were answered. The patient agreed with the plan and demonstrated an understanding of the instructions.   The patient was advised to call back or seek an in-person evaluation if the symptoms worsen or if the condition fails to improve as anticipated.  [name], MD

## 2019-04-25 ENCOUNTER — Other Ambulatory Visit: Payer: Self-pay | Admitting: Oncology

## 2019-04-27 ENCOUNTER — Encounter: Payer: Self-pay | Admitting: Nurse Practitioner

## 2019-04-27 ENCOUNTER — Other Ambulatory Visit: Payer: Self-pay

## 2019-04-27 ENCOUNTER — Inpatient Hospital Stay: Payer: Medicare HMO

## 2019-04-27 ENCOUNTER — Inpatient Hospital Stay (HOSPITAL_BASED_OUTPATIENT_CLINIC_OR_DEPARTMENT_OTHER): Payer: Medicare HMO | Admitting: Nurse Practitioner

## 2019-04-27 VITALS — BP 108/79

## 2019-04-27 VITALS — BP 123/76 | HR 70 | Temp 98.9°F | Resp 17 | Wt 175.2 lb

## 2019-04-27 DIAGNOSIS — C20 Malignant neoplasm of rectum: Secondary | ICD-10-CM

## 2019-04-27 DIAGNOSIS — D701 Agranulocytosis secondary to cancer chemotherapy: Secondary | ICD-10-CM | POA: Diagnosis not present

## 2019-04-27 DIAGNOSIS — R21 Rash and other nonspecific skin eruption: Secondary | ICD-10-CM | POA: Diagnosis not present

## 2019-04-27 DIAGNOSIS — C787 Secondary malignant neoplasm of liver and intrahepatic bile duct: Secondary | ICD-10-CM | POA: Diagnosis not present

## 2019-04-27 DIAGNOSIS — D649 Anemia, unspecified: Secondary | ICD-10-CM | POA: Diagnosis not present

## 2019-04-27 DIAGNOSIS — R160 Hepatomegaly, not elsewhere classified: Secondary | ICD-10-CM | POA: Diagnosis not present

## 2019-04-27 DIAGNOSIS — Z5112 Encounter for antineoplastic immunotherapy: Secondary | ICD-10-CM | POA: Diagnosis not present

## 2019-04-27 DIAGNOSIS — R918 Other nonspecific abnormal finding of lung field: Secondary | ICD-10-CM | POA: Diagnosis not present

## 2019-04-27 DIAGNOSIS — Z95828 Presence of other vascular implants and grafts: Secondary | ICD-10-CM

## 2019-04-27 DIAGNOSIS — G620 Drug-induced polyneuropathy: Secondary | ICD-10-CM | POA: Diagnosis not present

## 2019-04-27 LAB — CMP (CANCER CENTER ONLY)
ALT: 14 U/L (ref 0–44)
AST: 18 U/L (ref 15–41)
Albumin: 3.6 g/dL (ref 3.5–5.0)
Alkaline Phosphatase: 87 U/L (ref 38–126)
Anion gap: 7 (ref 5–15)
BUN: 16 mg/dL (ref 8–23)
CO2: 25 mmol/L (ref 22–32)
Calcium: 8.5 mg/dL — ABNORMAL LOW (ref 8.9–10.3)
Chloride: 107 mmol/L (ref 98–111)
Creatinine: 1.06 mg/dL (ref 0.61–1.24)
GFR, Est AFR Am: >60 mL/min (ref >60)
GFR, Estimated: >60 mL/min (ref >60)
Glucose, Bld: 101 mg/dL — ABNORMAL HIGH (ref 70–99)
Potassium: 4 mmol/L (ref 3.5–5.1)
Sodium: 139 mmol/L (ref 135–145)
Total Bilirubin: 0.4 mg/dL (ref 0.3–1.2)
Total Protein: 7.3 g/dL (ref 6.5–8.1)

## 2019-04-27 LAB — CBC WITH DIFFERENTIAL (CANCER CENTER ONLY)
Abs Immature Granulocytes: 0.01 10*3/uL (ref 0.00–0.07)
Basophils Absolute: 0 10*3/uL (ref 0.0–0.1)
Basophils Relative: 0 %
Eosinophils Absolute: 0 10*3/uL (ref 0.0–0.5)
Eosinophils Relative: 1 %
HCT: 27.7 % — ABNORMAL LOW (ref 39.0–52.0)
Hemoglobin: 9.1 g/dL — ABNORMAL LOW (ref 13.0–17.0)
Immature Granulocytes: 0 %
Lymphocytes Relative: 53 %
Lymphs Abs: 1.2 10*3/uL (ref 0.7–4.0)
MCH: 34.7 pg — ABNORMAL HIGH (ref 26.0–34.0)
MCHC: 32.9 g/dL (ref 30.0–36.0)
MCV: 105.7 fL — ABNORMAL HIGH (ref 80.0–100.0)
Monocytes Absolute: 0.1 10*3/uL (ref 0.1–1.0)
Monocytes Relative: 5 %
Neutro Abs: 0.9 10*3/uL — ABNORMAL LOW (ref 1.7–7.7)
Neutrophils Relative %: 41 %
Platelet Count: 160 10*3/uL (ref 150–400)
RBC: 2.62 MIL/uL — ABNORMAL LOW (ref 4.22–5.81)
RDW: 18.5 % — ABNORMAL HIGH (ref 11.5–15.5)
WBC Count: 2.3 10*3/uL — ABNORMAL LOW (ref 4.0–10.5)
nRBC: 0 % (ref 0.0–0.2)

## 2019-04-27 LAB — CEA (IN HOUSE-CHCC): CEA (CHCC-In House): 206.74 ng/mL — ABNORMAL HIGH (ref 0.00–5.00)

## 2019-04-27 MED ORDER — SODIUM CHLORIDE 0.9 % IV SOLN
Freq: Once | INTRAVENOUS | Status: AC
  Administered 2019-04-27: 14:00:00 via INTRAVENOUS
  Filled 2019-04-27: qty 250

## 2019-04-27 MED ORDER — SODIUM CHLORIDE 0.9% FLUSH
10.0000 mL | INTRAVENOUS | Status: DC | PRN
  Administered 2019-04-27: 10 mL
  Filled 2019-04-27: qty 10

## 2019-04-27 MED ORDER — SODIUM CHLORIDE 0.9% FLUSH
10.0000 mL | Freq: Once | INTRAVENOUS | Status: AC
  Administered 2019-04-27: 10 mL
  Filled 2019-04-27: qty 10

## 2019-04-27 MED ORDER — SODIUM CHLORIDE 0.9 % IV SOLN
5.0000 mg/kg | Freq: Once | INTRAVENOUS | Status: AC
  Administered 2019-04-27: 400 mg via INTRAVENOUS
  Filled 2019-04-27: qty 16

## 2019-04-27 MED ORDER — HEPARIN SOD (PORK) LOCK FLUSH 100 UNIT/ML IV SOLN
500.0000 [IU] | Freq: Once | INTRAVENOUS | Status: AC | PRN
  Administered 2019-04-27: 500 [IU]
  Filled 2019-04-27: qty 5

## 2019-04-27 MED ORDER — AMLODIPINE BESYLATE 5 MG PO TABS: 5.0000 mg | ORAL_TABLET | Freq: Every day | ORAL | 2 refills | Status: AC

## 2019-04-27 NOTE — Progress Notes (Signed)
[place] OFFICE PROGRESS NOTE   Diagnosis: Rectal cancer  INTERVAL HISTORY:   Mr. Stowers returns as scheduled.  He completed cycle 4 Lonsurf beginning 04/12/2019.  He denies nausea/vomiting.  No mouth sores.  No diarrhea.  He denies bleeding.  No shortness of breath or chest pain.  He notes less pain with bowel movements.  Objective:  Vital signs in last 24 hours:  Blood pressure 123/76, pulse 70, temperature 98.9 F (37.2 C), temperature source Temporal, resp. rate 17, weight 175 lb 3.2 oz (79.5 kg), SpO2 100 %.    HEENT: No thrush or ulcers. GI: Abdomen soft and nontender.  No hepatomegaly. Vascular: No leg edema. Neuro: Alert and oriented. Skin: No rash. Port-A-Cath without erythema.   Lab Results:  Lab Results  Component Value Date   WBC 2.3 (L) 04/27/2019   HGB 9.1 (L) 04/27/2019   HCT 27.7 (L) 04/27/2019   MCV 105.7 (H) 04/27/2019   PLT 160 04/27/2019   NEUTROABS PENDING 04/27/2019    Imaging:  No results found.  Medications: I have reviewed the patient's current medications.  Assessment/Plan: 1.Rectal cancer, invasive adenocarcinoma confirmed at colonoscopy 05/22/2015, EUS staging 06/01/2015 revealed a uT3,N1 lesion at 7 cm from the anal verge  Staging CT abdomen/pelvis 05/15/2015 confirmed a mass at the rectosigmoid colon, a suspicious perirectal lymph node, and a 6 mm right liver lesion felt to most likely be a cyst  CT chest 05/30/2015 with indeterminate pulmonary nodules and multiple liver lesions suspicious for metastases  Ultrasound 06/13/2014 with no liver lesions identified  MRI abdomen 06/21/2015 consistent with multiple liver metastases  CT biopsy of liver lesion 06/26/2015. Pathology showed metastatic adenocarcinoma consistent with colonic primary.MSI-stable, low mutation burden, no RAS or BRAFmutation  Cycle 1 FOLFOX 07/10/2015  Cycle 2 FOLFOX plus Avastin 07/24/2015 with Neulasta support  Cycle 3 FOLFOX plus  Avastin 08/07/2015  Cycle 4 FOLFOX plus Avastin 08/21/2015  cycle 5 FOLFOX plus Avastin 09/04/2015  Restaging CTs 09/15/2015 revealed a decrease in the size of liver lesions, decreased rectal primary, stable/decreased size of tiny lung nodules  Cycle 6 FOLFOX plus Avastin 09/18/2015  Cycle 7 FOLFOX plus Avastin 10/02/2015  Cycle 8 FOLFOX plus Avastin 10/16/2015  Cycle 9 FOLFOX plus Avastin 11/06/2015  Cycle 10 FOLFOX plus Avastin 11/20/2015  CTs 12/07/2015-mild decrease in wall thickening at the rectum, decrease in tiny hepatic metastases, stable lung nodules, no evidence of progressive metastatic disease, new left lower lobe infiltrate  Treatment changed to every three-week Avastin with every other week Xeloda beginning 12/11/2015  Restaging CTs 04/12/2016-interval increase in size of adjacent right upper lobe pulmonary nodules and right lower lobe pulmonary nodule; unchanged 5 mm lesion right hepatic lobe; no additional visualized hepatic metastasis. Grossly unchanged wall thickening of the rectum.  Continuation of Xeloda every other week and every three-week AvastinXeloda placed on hold 05/06/2016 due to hand-foot syndrome  Xeloda resumed 05/28/2016 1500 mg every morning and 1000 mg every afternoon 7 days on/7 days off  CT 09/27/2016-enlargement of lung nodules and new liver lesions, stable rectal mass  Cycle 1 FOLFIRI/panitumumab 10/08/2016  Cycle 2 panitumumab 10/23/2016 (FOLFIRI held secondary to neutropenia)  Cycle 3 FOLFIRI/PANITUMUMAB 11/04/2016  Cycle 4 FOLFIRI/panitumumab 11/18/2016  Cycle 5 FOLFIRI/panitumumab 12/16/2016  Cycle 6 FOLFIRI/panitumumab 12/31/2016  CTs 01/16/2017-decreased size of pulmonary nodules and liver lesions. Decreased perirectal lymph node  Cycle 7 FOLFIRI/PANITUMUMAB 01/28/2017  Cycle 8FOLFIRI/PANITUMUMAB 02/10/2017  Cycle 9 FOLFIRI/PANITUMUMAB 02/24/2017  Cycle 10 FOLFIRI/panitumumab 03/10/2017 (irinotecan held secondary to  neutropenia)  Cycle  11 FOLFIRI/panitumumab 03/24/2017  Cycle12 FOLFIRI/Panitumumab 04/07/2017  Restaging CTs 04/25/2017-stable rectal mass with adjacent 8 mm left perirectal nodal metastasis. Prior hepatic metastases not visualized on the current study. Indeterminate 16 mm enhancing lesion present in segment 6, indeterminate. Minimal residual nodularity in the right upper lobe. No new/progressive pulmonary metastases.  Maintenance Xeloda/panitumumab 04/28/2017  Restaging CTs 08/18/2017-new and enlarging bilateral lung nodules; 2 hyperattenuating lesions in the liver appear similar to prior study; progression of wall thickening in the rectum with enlarging perirectal lymph nodes.  Initiation of radiation/Xeloda 09/01/2017, completed 09/22/2017  Cycle 1 FOLFIRI/Panitumumab 10/13/2017  Cycle 2 FOLFIRI 11/03/2017  Cycle 3 FOLFIRI 11/17/2017  Cycle 4 FOLFIRI 12/01/2017  Cycle 5 FOLFIRI 12/15/2017  CTs7/26/2019-mild progression lungs and liver; significantly improved rectal wall thickening; resolution of perirectal nodal adenopathy.  Cycle 6 FOLFIRI 12/29/2017  Cycle 7 FOLFIRI 01/12/2018  Cycle 8 FOLFIRI 01/26/2018  Cycle 9 FOLFIRI 02/09/2018  Cycle 10 FOLFIRI 02/23/2018  Restaging CTs 03/06/2018-majority of the pulmonary nodules are similar in size, at least 2 of the nodules are minimally increased in size. Similar appearing lesions within the liver. Similar-appearing mild wall thickening of the rectum.  Cycle 11FOLFIRI 03/09/2018  Cycle 12 FOLFIRI 03/23/2018  Cycle 13 FOLFIRI 04/06/2018  Cycle 14 FOLFIRI 04/20/2018  Cycle 15 FOLFIRI 05/04/2018  CT 05/15/2018-no change in pulmonary nodules, mild increase in size of 2 liver lesions, stable rectal wall thickening  On study at [facility] [place] with palbociclib andUlixertinibbeginning 07/15/2018  [facility]  Restaging CTs 10/07/2018-enlargement of multiple lateral pulmonary nodules and liver lesions;  redemonstrated circumferential thickening of the mid rectum with interval enlargement of an irregular nodule of the left perirectal fat.  Cycle 1 FOLFOX 10/12/2018  Cycle 2 FOLFOX 11/02/2018 (allergic reaction to oxaliplatin, oxaliplatin discontinued)  Guardant testing 11/30/2018-KRAS Q61H,EGFR amplification, MSI high not detected(NOTE:Plan for repeat guardant testing at a 62-monthinterval)  Cycle 1 Lonsurf 01/11/2019  Cycle 2 Lonsurf 02/08/2019, dose reduced  Every 2-week Avastin beginning 02/09/2019  Cycle 3 Lonsurf 03/08/2019  Cycle 4 Lonsurf 04/12/2019 2.Multiple colon polyps on the colonoscopy 05/22/2015, tubular villous adenoma and a tubular adenoma were removed 3.Anemia-likely secondary to rectal bleeding.  4.Family history of multiple cancers including breast and prostate cancer 5.Transient right arm numbness 06/20/2015 6. Port-A-Cath placement 07/06/2015 by Dr. TMarcello Moores7. Mild neutropenia secondary to chemotherapy following cycle 1 FOLFOX. Neulasta added with cycle 2. 8. Delayed nausea following chemotherapy-emend added with cycle 3 FOLFOX;Emend added with cycle 12 FOLFIRI. 9. Early oxaliplatin neuropathy 10. 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 Xeloda resumed at a reduced dose. 11. Bilateral hand weakness. Question related to previous oxaliplatin. Referred to neurology 06/17/2016. 12. Hypertension-amlodipine started 07/08/2016 13. Rash secondary to PANITUMUMAB.  14. Neutropenia following cycle 1 FOLFIRI/PANITUMUMAB; neutropenia following cycle 4 FOLFIRI/PANITUMUMAB.  15.  Neutropenia secondary to Lonsurf-cycle 4 of Lonsurf delayed until 04/12/2019  Disposition: Mr. WJohansonappears stable.  He has completed 4 cycles of Lonsurf.  He continues Avastin every 2 weeks.  Plan to proceed with Avastin today as scheduled.  He will undergo restaging CTs prior to his next visit.  We reviewed the CBC from today.  He has mild neutropenia.  He understands to  contact the office with fever, chills, other signs of infection.  He will return for lab, follow-up, Avastin in 2 weeks.  He will contact the office in the interim as outlined above or with any other problems.  Plan reviewed with Dr. SBenay Spice    LNed CardANP/GNP-BC  04/27/2019  1:47 PM

## 2019-04-27 NOTE — Patient Instructions (Signed)

## 2019-04-27 NOTE — Patient Instructions (Signed)
[facility] Discharge Instructions for Patients Receiving Chemotherapy  Today you received the following chemotherapy agents Bevacizumab  To help prevent nausea and vomiting after your treatment, we encourage you to take your nausea medication as directed by your MD.   If you develop nausea and vomiting that is not controlled by your nausea medication, call the clinic.   BELOW ARE SYMPTOMS THAT SHOULD BE REPORTED IMMEDIATELY:  *FEVER GREATER THAN 100.5 F  *CHILLS WITH OR WITHOUT FEVER  NAUSEA AND VOMITING THAT IS NOT CONTROLLED WITH YOUR NAUSEA MEDICATION  *UNUSUAL SHORTNESS OF BREATH  *UNUSUAL BRUISING OR BLEEDING  TENDERNESS IN MOUTH AND THROAT WITH OR WITHOUT PRESENCE OF ULCERS  *URINARY PROBLEMS  *BOWEL PROBLEMS  UNUSUAL RASH Items with * indicate a potential emergency and should be followed up as soon as possible.  Feel free to call the clinic should you have any questions or concerns. The clinic phone number is [phone].  Please show the [facility] at check-in to the Emergency Department and triage nurse. Coronavirus (COVID-19) Are you at risk?  Are you at risk for the Coronavirus (COVID-19)?  To be considered HIGH RISK for Coronavirus (COVID-19), you have to meet the following criteria:  . Traveled to [place], [place], [place], [place] or [place]; or in the [place] to [place], [place], [place], or [place]; and have fever, cough, and shortness of breath within the last 2 weeks of travel OR . Been in close contact with a person diagnosed with COVID-19 within the last 2 weeks and have fever, cough, and shortness of breath . IF YOU DO NOT MEET THESE CRITERIA, YOU ARE CONSIDERED LOW RISK FOR COVID-19.  What to do if you are HIGH RISK for COVID-19?  Marland Kitchen If you are having a medical emergency, call 911. . Seek medical care right away. Before you go to a doctor's office, urgent care or emergency department, call ahead and tell them about  your recent travel, contact with someone diagnosed with COVID-19, and your symptoms. You should receive instructions from your physician's office regarding next steps of care.  . When you arrive at healthcare provider, tell the healthcare staff immediately you have returned from visiting [place], [place], [place], [place] or [place]; or traveled in the [place] to [place], [place], [place], or [place]; in the last two weeks or you have been in close contact with a person diagnosed with COVID-19 in the last 2 weeks.   . Tell the health care staff about your symptoms: fever, cough and shortness of breath. . After you have been seen by a medical provider, you will be either: o Tested for (COVID-19) and discharged home on quarantine except to seek medical care if symptoms worsen, and asked to  - Stay home and avoid contact with others until you get your results (4-5 days)  - Avoid travel on public transportation if possible (such as bus, train, or airplane) or o Sent to the Emergency Department by EMS for evaluation, COVID-19 testing, and possible admission depending on your condition and test results.  What to do if you are LOW RISK for COVID-19?  Reduce your risk of any infection by using the same precautions used for avoiding the common cold or flu:  Marland Kitchen Wash your hands often with soap and warm water for at least 20 seconds.  If soap and water are not readily available, use an alcohol-based hand sanitizer with at least 60% alcohol.  . If coughing  or sneezing, cover your mouth and nose by coughing or sneezing into the elbow areas of your shirt or coat, into a tissue or into your sleeve (not your hands). . Avoid shaking hands with others and consider head nods or verbal greetings only. . Avoid touching your eyes, nose, or mouth with unwashed hands.  . Avoid close contact with people who are sick. . Avoid places or events with large numbers of people in one location, like concerts or sporting  events. . Carefully consider travel plans you have or are making. . If you are planning any travel outside or inside the [place], visit the CDC's Travelers' Health webpage for the latest health notices. . If you have some symptoms but not all symptoms, continue to monitor at home and seek medical attention if your symptoms worsen. . If you are having a medical emergency, call 911.   ADDITIONAL HEALTHCARE OPTIONS FOR PATIENTS  [facility] / e-Visit: [email or web address]         [facility]: [phone]  [facility]: [phone]                   [facility]: [phone]    

## 2019-04-28 ENCOUNTER — Telehealth: Payer: Self-pay | Admitting: Oncology

## 2019-04-28 ENCOUNTER — Telehealth: Payer: Self-pay | Admitting: *Deleted

## 2019-04-28 NOTE — Telephone Encounter (Signed)
-----   Message from Owens Shark, NP sent at 04/27/2019  4:54 PM EST ----- Please let him know the CEA is better.  Follow-up as scheduled.

## 2019-04-28 NOTE — Telephone Encounter (Signed)
Notified pt, per Ned Card, NP, that his CEA was better and to f/u as scheduled. Pt was very appreciative and verbalized understanding.

## 2019-04-28 NOTE — Telephone Encounter (Signed)
Scheduled per los. Called and spoke with patient. Confirmed appt 

## 2019-04-30 ENCOUNTER — Telehealth: Payer: Self-pay | Admitting: Oncology

## 2019-04-30 NOTE — Telephone Encounter (Signed)
Called and spoke with patient. Confirmed 12/8 appt

## 2019-05-07 ENCOUNTER — Ambulatory Visit (HOSPITAL_COMMUNITY)
Admission: RE | Admit: 2019-05-07 | Discharge: 2019-05-07 | Disposition: A | Discharge status: Home / Self Care | Payer: Medicare HMO | Source: Ambulatory Visit | Attending: Nurse Practitioner | Admitting: Nurse Practitioner

## 2019-05-07 ENCOUNTER — Other Ambulatory Visit: Payer: Self-pay

## 2019-05-07 DIAGNOSIS — C20 Malignant neoplasm of rectum: Secondary | ICD-10-CM | POA: Insufficient documentation

## 2019-05-07 DIAGNOSIS — C787 Secondary malignant neoplasm of liver and intrahepatic bile duct: Secondary | ICD-10-CM | POA: Diagnosis not present

## 2019-05-07 MED ORDER — IOHEXOL 300 MG/ML  SOLN
100.0000 mL | Freq: Once | INTRAMUSCULAR | Status: AC | PRN
  Administered 2019-05-07: 100 mL via INTRAVENOUS

## 2019-05-09 ENCOUNTER — Other Ambulatory Visit: Payer: Self-pay | Admitting: Oncology

## 2019-05-10 ENCOUNTER — Ambulatory Visit: Payer: Medicare HMO | Admitting: Nurse Practitioner

## 2019-05-10 ENCOUNTER — Other Ambulatory Visit: Payer: Medicare HMO

## 2019-05-11 ENCOUNTER — Inpatient Hospital Stay: Payer: Medicare HMO

## 2019-05-11 ENCOUNTER — Inpatient Hospital Stay: Payer: Medicare HMO | Attending: Nurse Practitioner | Admitting: Oncology

## 2019-05-11 ENCOUNTER — Other Ambulatory Visit: Payer: Self-pay | Admitting: *Deleted

## 2019-05-11 ENCOUNTER — Inpatient Hospital Stay: Payer: Medicare HMO | Admitting: Nurse Practitioner

## 2019-05-11 ENCOUNTER — Telehealth: Payer: Self-pay | Admitting: *Deleted

## 2019-05-11 ENCOUNTER — Ambulatory Visit: Payer: Medicare HMO

## 2019-05-11 DIAGNOSIS — C20 Malignant neoplasm of rectum: Secondary | ICD-10-CM | POA: Diagnosis not present

## 2019-05-11 MED ORDER — OXYCODONE-ACETAMINOPHEN 5-325 MG PO TABS: 1.0000 | ORAL_TABLET | ORAL | 0 refills | Status: DC | PRN

## 2019-05-11 NOTE — Telephone Encounter (Addendum)
Left message to cancel appointments today. He is not feeling well--stomach hurts and rectum hurts as well. MD notified. Asking to reschedule to next week. Per Dr. Benay Spice: Needs to have video visit today. Notified scheduler. Patient agrees to this.

## 2019-05-11 NOTE — Progress Notes (Signed)
[place] that Frankey Poot has been discontinued due to disease progression.

## 2019-05-11 NOTE — Progress Notes (Signed)
[place] OFFICE VISIT PROGRESS NOTE  I connected with [name] on 05/11/19 at  4:00 PM EST by telephone and verified that I am speaking with the correct person using two identifiers.   I discussed the limitations, risks, security and privacy concerns of performing an evaluation and management service by telemedicine and the availability of in-person appointments. I also discussed with the patient that there may be a patient responsible charge related to this service. The patient expressed understanding and agreed to proceed.  Other persons participating in the visit and their role in the encounter: Wife  Patient's location: Home Provider's location: Office   Diagnosis: Rectal cancer  INTERVAL HISTORY:   Mr. Pino is seen today for a telehealth visit.  This is secondary to his medical condition and distancing with the Covid pandemic.  The visit started as a video visit, but the patient was unable to connect so we proceeded with a telephone encounter.  Mr. Cerullo completed another cycle of Lonsurf beginning 04/12/2019.  He complains of rectal urgency and pain.  The pain is worse after bowel movements.  He also has abdominal pain.  The abdominal pain is worse after eating.    Lab Results:  Lab Results  Component Value Date   WBC 2.3 (L) 04/27/2019   HGB 9.1 (L) 04/27/2019   HCT 27.7 (L) 04/27/2019   MCV 105.7 (H) 04/27/2019   PLT 160 04/27/2019   NEUTROABS 0.9 (L) 04/27/2019     Medications: I have reviewed the patient's current medications.  Assessment/Plan: 1.Rectal cancer, invasive adenocarcinoma confirmed at colonoscopy 05/22/2015, EUS staging 06/01/2015 revealed a uT3,N1 lesion at 7 cm from the anal verge  Staging CT abdomen/pelvis 05/15/2015 confirmed a mass at the rectosigmoid colon, a suspicious perirectal lymph node, and a 6 mm right liver lesion felt to most likely be a cyst  CT chest 05/30/2015 with  indeterminate pulmonary nodules and multiple liver lesions suspicious for metastases  Ultrasound 06/13/2014 with no liver lesions identified  MRI abdomen 06/21/2015 consistent with multiple liver metastases  CT biopsy of liver lesion 06/26/2015. Pathology showed metastatic adenocarcinoma consistent with colonic primary.MSI-stable, low mutation burden, no RAS or BRAFmutation  Cycle 1 FOLFOX 07/10/2015  Cycle 2 FOLFOX plus Avastin 07/24/2015 with Neulasta support  Cycle 3 FOLFOX plus Avastin 08/07/2015  Cycle 4 FOLFOX plus Avastin 08/21/2015  cycle 5 FOLFOX plus Avastin 09/04/2015  Restaging CTs 09/15/2015 revealed a decrease in the size of liver lesions, decreased rectal primary, stable/decreased size of tiny lung nodules  Cycle 6 FOLFOX plus Avastin 09/18/2015  Cycle 7 FOLFOX plus Avastin 10/02/2015  Cycle 8 FOLFOX plus Avastin 10/16/2015  Cycle 9 FOLFOX plus Avastin 11/06/2015  Cycle 10 FOLFOX plus Avastin 11/20/2015  CTs 12/07/2015-mild decrease in wall thickening at the rectum, decrease in tiny hepatic metastases, stable lung nodules, no evidence of progressive metastatic disease, new left lower lobe infiltrate  Treatment changed to every three-week Avastin with every other week Xeloda beginning 12/11/2015  Restaging CTs 04/12/2016-interval increase in size of adjacent right upper lobe pulmonary nodules and right lower lobe pulmonary nodule; unchanged 5 mm lesion right hepatic lobe; no additional visualized hepatic metastasis. Grossly unchanged wall thickening of the rectum.  Continuation of Xeloda every other week and every three-week AvastinXeloda placed on hold 05/06/2016 due to hand-foot syndrome  Xeloda resumed 05/28/2016 1500 mg every morning and 1000 mg every afternoon 7 days on/7 days off  CT 09/27/2016-enlargement of lung nodules and new liver lesions,  stable rectal mass  Cycle 1 FOLFIRI/panitumumab 10/08/2016  Cycle 2 panitumumab 10/23/2016 (FOLFIRI held  secondary to neutropenia)  Cycle 3 FOLFIRI/PANITUMUMAB 11/04/2016  Cycle 4 FOLFIRI/panitumumab 11/18/2016  Cycle 5 FOLFIRI/panitumumab 12/16/2016  Cycle 6 FOLFIRI/panitumumab 12/31/2016  CTs 01/16/2017-decreased size of pulmonary nodules and liver lesions. Decreased perirectal lymph node  Cycle 7 FOLFIRI/PANITUMUMAB 01/28/2017  Cycle 8FOLFIRI/PANITUMUMAB 02/10/2017  Cycle 9 FOLFIRI/PANITUMUMAB 02/24/2017  Cycle 10 FOLFIRI/panitumumab 03/10/2017 (irinotecan held secondary to neutropenia)  Cycle 11 FOLFIRI/panitumumab 03/24/2017  Cycle12 FOLFIRI/Panitumumab 04/07/2017  Restaging CTs 04/25/2017-stable rectal mass with adjacent 8 mm left perirectal nodal metastasis. Prior hepatic metastases not visualized on the current study. Indeterminate 16 mm enhancing lesion present in segment 6, indeterminate. Minimal residual nodularity in the right upper lobe. No new/progressive pulmonary metastases.  Maintenance Xeloda/panitumumab 04/28/2017  Restaging CTs 08/18/2017-new and enlarging bilateral lung nodules; 2 hyperattenuating lesions in the liver appear similar to prior study; progression of wall thickening in the rectum with enlarging perirectal lymph nodes.  Initiation of radiation/Xeloda 09/01/2017, completed 09/22/2017  Cycle 1 FOLFIRI/Panitumumab 10/13/2017  Cycle 2 FOLFIRI 11/03/2017  Cycle 3 FOLFIRI 11/17/2017  Cycle 4 FOLFIRI 12/01/2017  Cycle 5 FOLFIRI 12/15/2017  CTs7/26/2019-mild progression lungs and liver; significantly improved rectal wall thickening; resolution of perirectal nodal adenopathy.  Cycle 6 FOLFIRI 12/29/2017  Cycle 7 FOLFIRI 01/12/2018  Cycle 8 FOLFIRI 01/26/2018  Cycle 9 FOLFIRI 02/09/2018  Cycle 10 FOLFIRI 02/23/2018  Restaging CTs 03/06/2018-majority of the pulmonary nodules are similar in size, at least 2 of the nodules are minimally increased in size. Similar appearing lesions within the liver. Similar-appearing mild wall thickening of the rectum.   Cycle 11FOLFIRI 03/09/2018  Cycle 12 FOLFIRI 03/23/2018  Cycle 13 FOLFIRI 04/06/2018  Cycle 14 FOLFIRI 04/20/2018  Cycle 15 FOLFIRI 05/04/2018  CT 05/15/2018-no change in pulmonary nodules, mild increase in size of 2 liver lesions, stable rectal wall thickening  On study at [facility] [place] with palbociclib andUlixertinibbeginning 07/15/2018  [facility]  Restaging CTs 10/07/2018-enlargement of multiple lateral pulmonary nodules and liver lesions; redemonstrated circumferential thickening of the mid rectum with interval enlargement of an irregular nodule of the left perirectal fat.  Cycle 1 FOLFOX 10/12/2018  Cycle 2 FOLFOX 11/02/2018 (allergic reaction to oxaliplatin, oxaliplatin discontinued)  Guardant testing 11/30/2018-KRAS Q61H,EGFR amplification, MSI high not detected(NOTE:Plan for repeat guardant testing at a 52-monthinterval)  Cycle 1 Lonsurf 01/11/2019  Cycle 2 Lonsurf 02/08/2019, dose reduced  Every 2-week Avastin beginning 02/09/2019  Cycle 3 Lonsurf 03/08/2019  Cycle 4 Lonsurf 04/12/2019  CTs 05/07/2019-significant disease progression in the liver, minimal increase in pulmonary metastatic disease, increase in left perirectal nodularity, persistent wall thickening in the lower rectum 2.Multiple colon polyps on the colonoscopy 05/22/2015, tubular villous adenoma and a tubular adenoma were removed 3.Anemia-likely secondary to rectal bleeding.  4.Family history of multiple cancers including breast and prostate cancer 5.Transient right arm numbness 06/20/2015 6. Port-A-Cath placement 07/06/2015 by Dr. TMarcello Moores7. Mild neutropenia secondary to chemotherapy following cycle 1 FOLFOX. Neulasta added with cycle 2. 8. Delayed nausea following chemotherapy-emend added with cycle 3 FOLFOX;Emend added with cycle 12 FOLFIRI. 9. Early oxaliplatin neuropathy 10. 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 Xeloda resumed at a reduced dose.  11. Bilateral hand weakness. Question related to previous oxaliplatin. Referred to neurology 06/17/2016. 12. Hypertension-amlodipine started 07/08/2016 13. Rash secondary to PANITUMUMAB.  14. Neutropenia following cycle 1 FOLFIRI/PANITUMUMAB; neutropenia following cycle 4 FOLFIRI/PANITUMUMAB. 15.Neutropenia secondary to Lonsurf-cycle 4 of Lonsurf delayed until 04/12/2019   Disposition: Mr. WRingenberghas metastatic  rectal cancer.  He has completed 4 cycles of salvage therapy with Lonsurf.  There is clinical and radiologic evidence of disease progression.  I discussed treatment options with Mr. Walthall.  He understands standard options are limited given his treatment history.  He has seen Dr. Reynaldo Minium in the past.  I will refer him back to Dr. Reynaldo Minium to see if he is now a candidate for a clinical trial.  He could be a candidate for repeat Panitumumab therapy, refused by his insurance company in the past.  We discussed Regorafenib .  He will begin a trial of oxycodone for pain.  Mr. Enriques will be scheduled for a follow-up visit next week.   I discussed the assessment and treatment plan with the patient. The patient was provided an opportunity to ask questions and all were answered. The patient agreed with the plan and demonstrated an understanding of the instructions.   The patient was advised to call back or seek an in-person evaluation if the symptoms worsen or if the condition fails to improve as anticipated.  I provided 30 minutes of telephone, chart review, and documentation time during this encounter, and > 50% was spent counseling as documented under my assessment & plan.  Betsy Coder ANP/GNP-BC   05/11/2019 4:16 PM

## 2019-05-12 ENCOUNTER — Telehealth: Payer: Self-pay | Admitting: Oncology

## 2019-05-12 ENCOUNTER — Other Ambulatory Visit: Payer: Self-pay | Admitting: Oncology

## 2019-05-12 DIAGNOSIS — C20 Malignant neoplasm of rectum: Secondary | ICD-10-CM

## 2019-05-12 NOTE — Telephone Encounter (Signed)
Faxed medical records to [facility] Dr. Reynaldo Minium at [phone], Release [id number]

## 2019-05-13 ENCOUNTER — Telehealth: Payer: Self-pay

## 2019-05-13 NOTE — Telephone Encounter (Addendum)
Faxed renewal form to [facility] [facility] [place] [facility] [place] Phone [phone]  Fax [phone]  05/13/2019 10:33 AM

## 2019-05-13 NOTE — Telephone Encounter (Signed)
Per doctor's note, therapy was dc'd on 05/11/19 due to disease progression. Renewal application will be cancelled since patient no longer on medication.  [place] [facility] [place] Phone [phone] Fax [phone] 05/13/2019 2:16 PM

## 2019-05-18 ENCOUNTER — Telehealth: Payer: Self-pay | Admitting: *Deleted

## 2019-05-18 NOTE — Telephone Encounter (Addendum)
Provided patient with appointment to see Dr. Reynaldo Minium on 06/03/19 at 1 pm via video visit. He declined the in person visit scheduled for 12/21 at 4pm. Provided him the phone [phone] to call if he needs to reschedule. He is asking if he still needs to see Dr. Benay Spice on 12/18--reports his pain is much better on the oxycodone. Informed him that it is OK to cancel 12/18. Will call him on January 4th to f/u on appointment with Dr. Verlan Friends.

## 2019-05-19 ENCOUNTER — Other Ambulatory Visit: Payer: Self-pay | Admitting: Oncology

## 2019-05-19 DIAGNOSIS — C20 Malignant neoplasm of rectum: Secondary | ICD-10-CM

## 2019-05-21 ENCOUNTER — Inpatient Hospital Stay: Payer: Medicare HMO | Admitting: Oncology

## 2019-05-25 ENCOUNTER — Inpatient Hospital Stay: Payer: Medicare HMO

## 2019-05-26 ENCOUNTER — Telehealth: Payer: Self-pay | Admitting: *Deleted

## 2019-05-26 DIAGNOSIS — C20 Malignant neoplasm of rectum: Secondary | ICD-10-CM | POA: Diagnosis not present

## 2019-05-26 NOTE — Telephone Encounter (Signed)
Per Dr. Gearldine Shown request, called patient and scheduled visit for 12/24 at 11:30 am and patient requested this to be telephone visit. Will discuss one cycle of FOLFIRI in January while awaiting molecular testing results for potential entry into clinical trial.

## 2019-05-27 ENCOUNTER — Inpatient Hospital Stay (HOSPITAL_BASED_OUTPATIENT_CLINIC_OR_DEPARTMENT_OTHER): Payer: Medicare HMO | Admitting: Oncology

## 2019-05-27 DIAGNOSIS — C20 Malignant neoplasm of rectum: Secondary | ICD-10-CM | POA: Diagnosis not present

## 2019-05-27 NOTE — Progress Notes (Signed)
[place] OFFICE VISIT PROGRESS NOTE  I connected with Hunter Walker on 05/27/19 at 12 PM by telephone and verified that I am speaking with the correct person using two identifiers.   I discussed the limitations, risks, security and privacy concerns of performing an evaluation and management service by telemedicine and the availability of in-person appointments. I also discussed with the patient that there may be a patient responsible charge related to this service. The patient expressed understanding and agreed to proceed.  Patient's location: Home Provider's location: Office   Diagnosis: Rectal cancer  INTERVAL HISTORY:   Hunter Walker is seen today for a telehealth visit.  This is secondary to the Covid pandemic.  He was seen by Dr. Reynaldo Minium earlier this week for a telehealth visit.  He is scheduled to go to [place] on 06/03/2019 for repeat Guardant 360 testing.  He will qualify for a clinical trial if he does not have a RAS mutation.  Dr. Reynaldo Minium recommends resuming FOLFIRI waiting on the molecular testing.  Hunter Walker reports feeling well.  Good appetite.  He is having soft bowel movements.  He has pain only after bowel movements.  He relates the pain to an external hemorrhoid.   Lab Results:  Lab Results  Component Value Date   WBC 2.3 (L) 04/27/2019   HGB 9.1 (L) 04/27/2019   HCT 27.7 (L) 04/27/2019   MCV 105.7 (H) 04/27/2019   PLT 160 04/27/2019   NEUTROABS 0.9 (L) 04/27/2019    Medications: I have reviewed the patient's current medications.  Assessment/Plan: 1.Rectal cancer, invasive adenocarcinoma confirmed at colonoscopy 05/22/2015, EUS staging 06/01/2015 revealed a uT3,N1 lesion at 7 cm from the anal verge  Staging CT abdomen/pelvis 05/15/2015 confirmed a mass at the rectosigmoid colon, a suspicious perirectal lymph node, and a 6 mm right liver lesion felt to most likely be a cyst  CT chest 05/30/2015 with  indeterminate pulmonary nodules and multiple liver lesions suspicious for metastases  Ultrasound 06/13/2014 with no liver lesions identified  MRI abdomen 06/21/2015 consistent with multiple liver metastases  CT biopsy of liver lesion 06/26/2015. Pathology showed metastatic adenocarcinoma consistent with colonic primary.MSI-stable, low mutation burden, no RAS or BRAFmutation  Cycle 1 FOLFOX 07/10/2015  Cycle 2 FOLFOX plus Avastin 07/24/2015 with Neulasta support  Cycle 3 FOLFOX plus Avastin 08/07/2015  Cycle 4 FOLFOX plus Avastin 08/21/2015  cycle 5 FOLFOX plus Avastin 09/04/2015  Restaging CTs 09/15/2015 revealed a decrease in the size of liver lesions, decreased rectal primary, stable/decreased size of tiny lung nodules  Cycle 6 FOLFOX plus Avastin 09/18/2015  Cycle 7 FOLFOX plus Avastin 10/02/2015  Cycle 8 FOLFOX plus Avastin 10/16/2015  Cycle 9 FOLFOX plus Avastin 11/06/2015  Cycle 10 FOLFOX plus Avastin 11/20/2015  CTs 12/07/2015-mild decrease in wall thickening at the rectum, decrease in tiny hepatic metastases, stable lung nodules, no evidence of progressive metastatic disease, new left lower lobe infiltrate  Treatment changed to every three-week Avastin with every other week Xeloda beginning 12/11/2015  Restaging CTs 04/12/2016-interval increase in size of adjacent right upper lobe pulmonary nodules and right lower lobe pulmonary nodule; unchanged 5 mm lesion right hepatic lobe; no additional visualized hepatic metastasis. Grossly unchanged wall thickening of the rectum.  Continuation of Xeloda every other week and every three-week AvastinXeloda placed on hold 05/06/2016 due to hand-foot syndrome  Xeloda resumed 05/28/2016 1500 mg every morning and 1000 mg every afternoon 7 days on/7 days off  CT 09/27/2016-enlargement of lung nodules and  new liver lesions, stable rectal mass  Cycle 1 FOLFIRI/panitumumab 10/08/2016  Cycle 2 panitumumab 10/23/2016 (FOLFIRI held  secondary to neutropenia)  Cycle 3 FOLFIRI/PANITUMUMAB 11/04/2016  Cycle 4 FOLFIRI/panitumumab 11/18/2016  Cycle 5 FOLFIRI/panitumumab 12/16/2016  Cycle 6 FOLFIRI/panitumumab 12/31/2016  CTs 01/16/2017-decreased size of pulmonary nodules and liver lesions. Decreased perirectal lymph node  Cycle 7 FOLFIRI/PANITUMUMAB 01/28/2017  Cycle 8FOLFIRI/PANITUMUMAB 02/10/2017  Cycle 9 FOLFIRI/PANITUMUMAB 02/24/2017  Cycle 10 FOLFIRI/panitumumab 03/10/2017 (irinotecan held secondary to neutropenia)  Cycle 11 FOLFIRI/panitumumab 03/24/2017  Cycle12 FOLFIRI/Panitumumab 04/07/2017  Restaging CTs 04/25/2017-stable rectal mass with adjacent 8 mm left perirectal nodal metastasis. Prior hepatic metastases not visualized on the current study. Indeterminate 16 mm enhancing lesion present in segment 6, indeterminate. Minimal residual nodularity in the right upper lobe. No new/progressive pulmonary metastases.  Maintenance Xeloda/panitumumab 04/28/2017  Restaging CTs 08/18/2017-new and enlarging bilateral lung nodules; 2 hyperattenuating lesions in the liver appear similar to prior study; progression of wall thickening in the rectum with enlarging perirectal lymph nodes.  Initiation of radiation/Xeloda 09/01/2017, completed 09/22/2017  Cycle 1 FOLFIRI/Panitumumab 10/13/2017  Cycle 2 FOLFIRI 11/03/2017  Cycle 3 FOLFIRI 11/17/2017  Cycle 4 FOLFIRI 12/01/2017  Cycle 5 FOLFIRI 12/15/2017  CTs7/26/2019-mild progression lungs and liver; significantly improved rectal wall thickening; resolution of perirectal nodal adenopathy.  Cycle 6 FOLFIRI 12/29/2017  Cycle 7 FOLFIRI 01/12/2018  Cycle 8 FOLFIRI 01/26/2018  Cycle 9 FOLFIRI 02/09/2018  Cycle 10 FOLFIRI 02/23/2018  Restaging CTs 03/06/2018-majority of the pulmonary nodules are similar in size, at least 2 of the nodules are minimally increased in size. Similar appearing lesions within the liver. Similar-appearing mild wall thickening of the  rectum.  Cycle 11FOLFIRI 03/09/2018  Cycle 12 FOLFIRI 03/23/2018  Cycle 13 FOLFIRI 04/06/2018  Cycle 14 FOLFIRI 04/20/2018  Cycle 15 FOLFIRI 05/04/2018  CT 05/15/2018-no change in pulmonary nodules, mild increase in size of 2 liver lesions, stable rectal wall thickening  On study at [facility], [place] with palbociclib andUlixertinibbeginning 07/15/2018  [facility]  Restaging CTs 10/07/2018-enlargement of multiple lateral pulmonary nodules and liver lesions; redemonstrated circumferential thickening of the mid rectum with interval enlargement of an irregular nodule of the left perirectal fat.  Cycle 1 FOLFOX 10/12/2018  Cycle 2 FOLFOX 11/02/2018 (allergic reaction to oxaliplatin, oxaliplatin discontinued)  Guardant testing 11/30/2018-KRAS Q61H,EGFR amplification, MSI high not detected(NOTE:Plan for repeat guardant testing at a 59-monthinterval)  Cycle 1 Lonsurf 01/11/2019  Cycle 2 Lonsurf 02/08/2019, dose reduced  Every 2-week Avastin beginning 02/09/2019  Cycle 3 Lonsurf 03/08/2019  Cycle 4 Lonsurf 04/12/2019  CTs 05/07/2019-significant disease progression in the liver, minimal increase in pulmonary metastatic disease, increase in left perirectal nodularity, persistent wall thickening in the lower rectum 2.Multiple colon polyps on the colonoscopy 05/22/2015, tubular villous adenoma and a tubular adenoma were removed 3.Anemia-likely secondary to rectal bleeding.  4.Family history of multiple cancers including breast and prostate cancer 5.Transient right arm numbness 06/20/2015 6. Port-A-Cath placement 07/06/2015 by Dr. TMarcello Moores7. Mild neutropenia secondary to chemotherapy following cycle 1 FOLFOX. Neulasta added with cycle 2. 8. Delayed nausea following chemotherapy-emend added with cycle 3 FOLFOX;Emend added with cycle 12 FOLFIRI. 9. Early oxaliplatin neuropathy 10. 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 Xeloda resumed at a  reduced dose. 11. Bilateral hand weakness. Question related to previous oxaliplatin. Referred to neurology 06/17/2016. 12. Hypertension-amlodipine started 07/08/2016 13. Rash secondary to PANITUMUMAB.  14. Neutropenia following cycle 1 FOLFIRI/PANITUMUMAB; neutropenia following cycle 4 FOLFIRI/PANITUMUMAB. 15.Neutropenia secondary to Lonsurf-cycle 4 of Lonsurf delayed until 04/12/2019    Disposition:  Hunter Walker has metastatic rectal cancer.  He has been treated with an extensive course of systemic therapy.  Standard treatment options are limited.  He had minimal disease progression while on FOLFIRI last year.  The plan is to resume FOLFIRI with the hope of stabilizing the metastatic tumor burden while he waits on clinical trial options.  We reviewed potential toxicities associated with the FOLFIRI regimen including the chance of, diarrhea, alopecia, and hematologic toxicity.  He agrees to proceed.  He will try [facility] for the hemorrhoid.  Mr. [place] will be scheduled for an office visit and FOLFIRI on 06/06/2018.   I discussed the assessment and treatment plan with the patient. The patient was provided an opportunity to ask questions and all were answered. The patient agreed with the plan and demonstrated an understanding of the instructions.   The patient was advised to call back or seek an in-person evaluation if the symptoms worsen or if the condition fails to improve as anticipated.  I provided 25 minutes of telephone, chart review, and documentation time during this encounter, and > 50% was spent counseling as documented under my assessment & plan.  Betsy Coder ANP/GNP-BC   05/27/2019 12:01 PM

## 2019-05-27 NOTE — Progress Notes (Signed)
DISCONTINUE OFF PATHWAY REGIMEN - Colorectal   [id number]:FOLFOX (q14d):   A cycle is every 14 days:     Oxaliplatin      Leucovorin      5-Fluorouracil      5-Fluorouracil   **Always confirm dose/schedule in your pharmacy ordering system**  REASON: Disease Progression PRIOR TREATMENT: Off Pathway: FOLFOX (q14d) TREATMENT RESPONSE: Progressive Disease (PD)  START ON PATHWAY REGIMEN - Colorectal     A cycle is every 14 days:     Irinotecan      Leucovorin      Fluorouracil      Fluorouracil   **Always confirm dose/schedule in your pharmacy ordering system**  Patient Characteristics: Distant Metastases, Nonsurgical Candidate, KRAS/NRAS Wild-Type (BRAF V600 Wild-Type/Unknown), Standard Cytotoxic Therapy, Second Line Standard Cytotoxic Therapy, Bevacizumab Ineligible Tumor Location: Rectal Therapeutic Status: Distant Metastases Microsatellite/Mismatch Repair Status: MSS/pMMR BRAF Mutation Status: Wild-Type (no mutation) KRAS/NRAS Mutation Status: Wild-Type (no mutation) Preferred Therapy Approach: Standard Cytotoxic Therapy Standard Cytotoxic Line of Therapy: Second Line Standard Cytotoxic Therapy Bevacizumab Eligibility: Ineligible Intent of Therapy: Non-Curative / Palliative Intent, Discussed with Patient 

## 2019-05-27 NOTE — Progress Notes (Signed)
DISCONTINUE ON PATHWAY REGIMEN - Colorectal     A cycle is every 14 days:     Irinotecan      Leucovorin      Fluorouracil      Fluorouracil   **Always confirm dose/schedule in your pharmacy ordering system**  REASON: Other Reason PRIOR TREATMENT: [id number]: FOLFIRI TREATMENT RESPONSE: Unable to Evaluate  START ON PATHWAY REGIMEN - Colorectal     A cycle is every 14 days:     Irinotecan      Leucovorin      Fluorouracil      Fluorouracil   **Always confirm dose/schedule in your pharmacy ordering system**  Patient Characteristics: Distant Metastases, Nonsurgical Candidate, KRAS/NRAS Wild-Type (BRAF V600 Wild-Type/Unknown), Standard Cytotoxic Therapy, Second Line Standard Cytotoxic Therapy, Bevacizumab Ineligible Tumor Location: Rectal Therapeutic Status: Distant Metastases Microsatellite/Mismatch Repair Status: MSS/pMMR BRAF Mutation Status: Wild-Type (no mutation) KRAS/NRAS Mutation Status: Wild-Type (no mutation) Preferred Therapy Approach: Standard Cytotoxic Therapy Standard Cytotoxic Line of Therapy: Second Line Standard Cytotoxic Therapy Bevacizumab Eligibility: Ineligible Intent of Therapy: Non-Curative / Palliative Intent, Discussed with Patient

## 2019-06-03 DIAGNOSIS — C20 Malignant neoplasm of rectum: Secondary | ICD-10-CM | POA: Diagnosis not present

## 2019-06-07 ENCOUNTER — Telehealth: Payer: Self-pay | Admitting: *Deleted

## 2019-06-07 ENCOUNTER — Telehealth: Payer: Self-pay | Admitting: Oncology

## 2019-06-07 NOTE — Telephone Encounter (Addendum)
Called to cancel appointments on 06/09/2019 and requests to move to 1/11 or 1/12 (can only come on Monday or Tuesday). Per Dr. Benay Spice : OK. Scheduling message sent to make requested change

## 2019-06-08 ENCOUNTER — Telehealth: Payer: Self-pay | Admitting: Oncology

## 2019-06-08 NOTE — Telephone Encounter (Signed)
Called pt per 1/4 sch message - appts moved to 1/11 - waiting on chemo approval from 1/11 - pt aware.

## 2019-06-09 ENCOUNTER — Ambulatory Visit: Payer: Medicare HMO

## 2019-06-09 ENCOUNTER — Other Ambulatory Visit: Payer: Medicare HMO

## 2019-06-09 ENCOUNTER — Ambulatory Visit: Payer: Medicare HMO | Admitting: Oncology

## 2019-06-11 ENCOUNTER — Inpatient Hospital Stay: Payer: Medicare HMO

## 2019-06-11 DIAGNOSIS — C20 Malignant neoplasm of rectum: Secondary | ICD-10-CM | POA: Diagnosis not present

## 2019-06-14 ENCOUNTER — Other Ambulatory Visit: Payer: Self-pay

## 2019-06-14 ENCOUNTER — Encounter (HOSPITAL_COMMUNITY): Payer: Self-pay | Admitting: Radiology

## 2019-06-14 ENCOUNTER — Inpatient Hospital Stay: Payer: Medicare HMO

## 2019-06-14 ENCOUNTER — Inpatient Hospital Stay: Payer: Medicare HMO | Attending: Nurse Practitioner

## 2019-06-14 ENCOUNTER — Inpatient Hospital Stay (HOSPITAL_BASED_OUTPATIENT_CLINIC_OR_DEPARTMENT_OTHER): Payer: Medicare HMO | Admitting: Oncology

## 2019-06-14 DIAGNOSIS — C20 Malignant neoplasm of rectum: Secondary | ICD-10-CM | POA: Diagnosis not present

## 2019-06-14 DIAGNOSIS — Z79899 Other long term (current) drug therapy: Secondary | ICD-10-CM | POA: Insufficient documentation

## 2019-06-14 DIAGNOSIS — T451X5A Adverse effect of antineoplastic and immunosuppressive drugs, initial encounter: Secondary | ICD-10-CM | POA: Diagnosis not present

## 2019-06-14 DIAGNOSIS — R531 Weakness: Secondary | ICD-10-CM | POA: Insufficient documentation

## 2019-06-14 DIAGNOSIS — R21 Rash and other nonspecific skin eruption: Secondary | ICD-10-CM | POA: Insufficient documentation

## 2019-06-14 DIAGNOSIS — Z803 Family history of malignant neoplasm of breast: Secondary | ICD-10-CM | POA: Insufficient documentation

## 2019-06-14 DIAGNOSIS — D701 Agranulocytosis secondary to cancer chemotherapy: Secondary | ICD-10-CM | POA: Insufficient documentation

## 2019-06-14 DIAGNOSIS — Z86010 Personal history of colonic polyps: Secondary | ICD-10-CM | POA: Insufficient documentation

## 2019-06-14 DIAGNOSIS — C787 Secondary malignant neoplasm of liver and intrahepatic bile duct: Secondary | ICD-10-CM | POA: Insufficient documentation

## 2019-06-14 DIAGNOSIS — D709 Neutropenia, unspecified: Secondary | ICD-10-CM | POA: Diagnosis not present

## 2019-06-14 DIAGNOSIS — R918 Other nonspecific abnormal finding of lung field: Secondary | ICD-10-CM | POA: Insufficient documentation

## 2019-06-14 DIAGNOSIS — Z8042 Family history of malignant neoplasm of prostate: Secondary | ICD-10-CM | POA: Insufficient documentation

## 2019-06-14 DIAGNOSIS — I10 Essential (primary) hypertension: Secondary | ICD-10-CM | POA: Insufficient documentation

## 2019-06-14 DIAGNOSIS — Z95828 Presence of other vascular implants and grafts: Secondary | ICD-10-CM

## 2019-06-14 DIAGNOSIS — G620 Drug-induced polyneuropathy: Secondary | ICD-10-CM | POA: Diagnosis not present

## 2019-06-14 DIAGNOSIS — R110 Nausea: Secondary | ICD-10-CM | POA: Insufficient documentation

## 2019-06-14 DIAGNOSIS — D649 Anemia, unspecified: Secondary | ICD-10-CM | POA: Insufficient documentation

## 2019-06-14 DIAGNOSIS — Z5111 Encounter for antineoplastic chemotherapy: Secondary | ICD-10-CM | POA: Insufficient documentation

## 2019-06-14 LAB — CBC WITH DIFFERENTIAL (CANCER CENTER ONLY)
Abs Immature Granulocytes: 0.02 10*3/uL (ref 0.00–0.07)
Basophils Absolute: 0.1 10*3/uL (ref 0.0–0.1)
Basophils Relative: 1 %
Eosinophils Absolute: 0.2 10*3/uL (ref 0.0–0.5)
Eosinophils Relative: 2 %
HCT: 36.7 % — ABNORMAL LOW (ref 39.0–52.0)
Hemoglobin: 12.1 g/dL — ABNORMAL LOW (ref 13.0–17.0)
Immature Granulocytes: 0 %
Lymphocytes Relative: 30 %
Lymphs Abs: 2.4 10*3/uL (ref 0.7–4.0)
MCH: 34.4 pg — ABNORMAL HIGH (ref 26.0–34.0)
MCHC: 33 g/dL (ref 30.0–36.0)
MCV: 104.3 fL — ABNORMAL HIGH (ref 80.0–100.0)
Monocytes Absolute: 0.6 10*3/uL (ref 0.1–1.0)
Monocytes Relative: 7 %
Neutro Abs: 4.8 10*3/uL (ref 1.7–7.7)
Neutrophils Relative %: 60 %
Platelet Count: 297 10*3/uL (ref 150–400)
RBC: 3.52 MIL/uL — ABNORMAL LOW (ref 4.22–5.81)
RDW: 14.2 % (ref 11.5–15.5)
WBC Count: 8.1 10*3/uL (ref 4.0–10.5)
nRBC: 0 % (ref 0.0–0.2)

## 2019-06-14 LAB — CMP (CANCER CENTER ONLY)
ALT: 18 U/L (ref 0–44)
AST: 28 U/L (ref 15–41)
Albumin: 3.8 g/dL (ref 3.5–5.0)
Alkaline Phosphatase: 107 U/L (ref 38–126)
Anion gap: 9 (ref 5–15)
BUN: 18 mg/dL (ref 8–23)
CO2: 26 mmol/L (ref 22–32)
Calcium: 9.2 mg/dL (ref 8.9–10.3)
Chloride: 103 mmol/L (ref 98–111)
Creatinine: 1.31 mg/dL — ABNORMAL HIGH (ref 0.61–1.24)
GFR, Est AFR Am: >60 mL/min (ref >60)
GFR, Estimated: 56 mL/min — ABNORMAL LOW (ref >60)
Glucose, Bld: 89 mg/dL (ref 70–99)
Potassium: 4.3 mmol/L (ref 3.5–5.1)
Sodium: 138 mmol/L (ref 135–145)
Total Bilirubin: 0.3 mg/dL (ref 0.3–1.2)
Total Protein: 8.6 g/dL — ABNORMAL HIGH (ref 6.5–8.1)

## 2019-06-14 LAB — CEA (IN HOUSE-CHCC): CEA (CHCC-In House): 285.18 ng/mL — ABNORMAL HIGH (ref 0.00–5.00)

## 2019-06-14 LAB — TOTAL PROTEIN, URINE DIPSTICK: Protein, ur: NEGATIVE mg/dL

## 2019-06-14 MED ORDER — SODIUM CHLORIDE 0.9 % IV SOLN
Freq: Once | INTRAVENOUS | Status: AC
  Filled 2019-06-14: qty 250

## 2019-06-14 MED ORDER — PALONOSETRON HCL INJECTION 0.25 MG/5ML
INTRAVENOUS | Status: AC
  Filled 2019-06-14: qty 5

## 2019-06-14 MED ORDER — ATROPINE SULFATE 1 MG/ML IJ SOLN
0.5000 mg | Freq: Once | INTRAMUSCULAR | Status: AC | PRN
  Administered 2019-06-14: 0.5 mg via INTRAVENOUS

## 2019-06-14 MED ORDER — FLUOROURACIL CHEMO INJECTION 2.5 GM/50ML
300.0000 mg/m2 | Freq: Once | INTRAVENOUS | Status: AC
  Administered 2019-06-14: 600 mg via INTRAVENOUS
  Filled 2019-06-14: qty 12

## 2019-06-14 MED ORDER — SODIUM CHLORIDE 0.9 % IV SOLN
2463.0000 mg/m2 | INTRAVENOUS | Status: DC
  Administered 2019-06-14: 13:00:00 5000 mg via INTRAVENOUS
  Filled 2019-06-14: qty 100

## 2019-06-14 MED ORDER — DEXAMETHASONE SODIUM PHOSPHATE 10 MG/ML IJ SOLN
INTRAMUSCULAR | Status: AC
  Filled 2019-06-14: qty 1

## 2019-06-14 MED ORDER — SODIUM CHLORIDE 0.9% FLUSH
10.0000 mL | Freq: Once | INTRAVENOUS | Status: AC
  Administered 2019-06-14: 10 mL
  Filled 2019-06-14: qty 10

## 2019-06-14 MED ORDER — HEPARIN SOD (PORK) LOCK FLUSH 100 UNIT/ML IV SOLN
500.0000 [IU] | Freq: Once | INTRAVENOUS | Status: DC | PRN
  Filled 2019-06-14: qty 5

## 2019-06-14 MED ORDER — PANTOPRAZOLE SODIUM 40 MG PO TBEC: 40.0000 mg | DELAYED_RELEASE_TABLET | Freq: Every day | ORAL | 3 refills | Status: DC

## 2019-06-14 MED ORDER — SODIUM CHLORIDE 0.9 % IV SOLN
300.0000 mg/m2 | Freq: Once | INTRAVENOUS | Status: AC
  Administered 2019-06-14: 606 mg via INTRAVENOUS
  Filled 2019-06-14: qty 30.3

## 2019-06-14 MED ORDER — ATROPINE SULFATE 1 MG/ML IJ SOLN
INTRAMUSCULAR | Status: AC
  Filled 2019-06-14: qty 1

## 2019-06-14 MED ORDER — PALONOSETRON HCL INJECTION 0.25 MG/5ML
0.2500 mg | Freq: Once | INTRAVENOUS | Status: AC
  Administered 2019-06-14: 10:00:00 0.25 mg via INTRAVENOUS

## 2019-06-14 MED ORDER — DEXAMETHASONE SODIUM PHOSPHATE 10 MG/ML IJ SOLN
10.0000 mg | Freq: Once | INTRAMUSCULAR | Status: AC
  Administered 2019-06-14: 10 mg via INTRAVENOUS

## 2019-06-14 MED ORDER — SODIUM CHLORIDE 0.9 % IV SOLN
125.0000 mg/m2 | Freq: Once | INTRAVENOUS | Status: AC
  Administered 2019-06-14: 260 mg via INTRAVENOUS
  Filled 2019-06-14: qty 13

## 2019-06-14 MED ORDER — SODIUM CHLORIDE 0.9 % IV SOLN: 10.0000 mg | Freq: Once | INTRAVENOUS | Status: DC

## 2019-06-14 MED ORDER — SODIUM CHLORIDE 0.9% FLUSH
10.0000 mL | INTRAVENOUS | Status: DC | PRN
  Filled 2019-06-14: qty 10

## 2019-06-14 NOTE — Patient Instructions (Signed)
[facility] Discharge Instructions for Patients Receiving Chemotherapy  Today you received the following chemotherapy agents: Irinotecan/Leucovorin/5FU  To help prevent nausea and vomiting after your treatment, we encourage you to take your nausea medication as directed.   If you develop nausea and vomiting that is not controlled by your nausea medication, call the clinic.   BELOW ARE SYMPTOMS THAT SHOULD BE REPORTED IMMEDIATELY:  *FEVER GREATER THAN 100.5 F  *CHILLS WITH OR WITHOUT FEVER  NAUSEA AND VOMITING THAT IS NOT CONTROLLED WITH YOUR NAUSEA MEDICATION  *UNUSUAL SHORTNESS OF BREATH  *UNUSUAL BRUISING OR BLEEDING  TENDERNESS IN MOUTH AND THROAT WITH OR WITHOUT PRESENCE OF ULCERS  *URINARY PROBLEMS  *BOWEL PROBLEMS  UNUSUAL RASH Items with * indicate a potential emergency and should be followed up as soon as possible.  Feel free to call the clinic should you have any questions or concerns. The clinic phone number is [phone].  Please show the CHEMO ALERT CARD at check-in to the Emergency Department and triage nurse.   

## 2019-06-14 NOTE — Progress Notes (Signed)
Hunter Walker Male, 69 y.o., [birth year] MRN:  [id number] Phone:  [phone] (H) PCP:  [name], MD Coverage:  [facility] Medicare/[employer] Medicare Hmo Next Appt With Radiology ([facility]-US 2) 06/22/2019 at 1:00 PM  RE: US Liver Biopsy Received: Today Message Contents  Arne Cleveland, MD  [name], [name]  Ok   [place] core liver lesion   DDH       Previous Messages   ----- Message -----  From: [name] D  Sent: 06/14/2019  9:56 AM EST  To: Ir Procedure Requests  Subject: US Liver Biopsy                  Procedure: US Liver Biopsy   Reason: Rectal cancer, metastatic rectal cancer, please biopsy liver lesion   History:  CT in computer   Provider:  Ladell Pier   Contact: [phone]

## 2019-06-14 NOTE — Progress Notes (Signed)
[facility] OFFICE PROGRESS NOTE   Diagnosis: Rectal cancer  INTERVAL HISTORY:   Hunter Walker returns as scheduled.  He has rectal pain with bowel movements.  He otherwise feels well.  Good appetite.  He continues to have peripheral neuropathy symptoms.  He relates this to previous treatment with oxaliplatin. The repeat guardant 360 testing with Dr. Reynaldo Minium confirmed a K-ras alteration.  The tumor mutation burden returned at 48.  An MSI-high phenotype was not detected.  Objective:  Vital signs in last 24 hours:  Blood pressure 132/84, pulse 80, temperature 98.2 F (36.8 C), temperature source Temporal, resp. rate 16, height _0  (1.854 m), weight 176 lb 3.2 oz (79.9 kg), SpO2 99 %.     Resp: Lungs clear bilaterally Cardio: Regular rate and rhythm GI: No hepatomegaly, nontender Vascular: No leg edema, the left lower leg is slightly larger than the right side  Skin: Dry skin over the trunk  Portacath/PICC-without erythema  Lab Results:  Lab Results  Component Value Date   WBC 8.1 06/14/2019   HGB 12.1 (L) 06/14/2019   HCT 36.7 (L) 06/14/2019   MCV 104.3 (H) 06/14/2019   PLT 297 06/14/2019   NEUTROABS 4.8 06/14/2019    CMP  Lab Results  Component Value Date   NA 139 04/27/2019   K 4.0 04/27/2019   CL 107 04/27/2019   CO2 25 04/27/2019   GLUCOSE 101 (H) 04/27/2019   BUN 16 04/27/2019   CREATININE 1.06 04/27/2019   CALCIUM 8.5 (L) 04/27/2019   PROT 7.3 04/27/2019   ALBUMIN 3.6 04/27/2019   AST 18 04/27/2019   ALT 14 04/27/2019   ALKPHOS 87 04/27/2019   BILITOT 0.4 04/27/2019   GFRNONAA >60 04/27/2019   GFRAA >60 04/27/2019    Lab Results  Component Value Date   CEA1 206.74 (H) 04/27/2019     Medications: I have reviewed the patient's current medications.   Assessment/Plan: 1.Rectal cancer, invasive adenocarcinoma confirmed at colonoscopy 05/22/2015, EUS staging 06/01/2015 revealed a uT3,N1 lesion at 7 cm from the anal verge   Staging CT abdomen/pelvis 05/15/2015 confirmed a mass at the rectosigmoid colon, a suspicious perirectal lymph node, and a 6 mm right liver lesion felt to most likely be a cyst  CT chest 05/30/2015 with indeterminate pulmonary nodules and multiple liver lesions suspicious for metastases  Ultrasound 06/13/2014 with no liver lesions identified  MRI abdomen 06/21/2015 consistent with multiple liver metastases  CT biopsy of liver lesion 06/26/2015. Pathology showed metastatic adenocarcinoma consistent with colonic primary.MSI-stable, low mutation burden, no RAS or BRAFmutation  Cycle 1 FOLFOX 07/10/2015  Cycle 2 FOLFOX plus Avastin 07/24/2015 with Neulasta support  Cycle 3 FOLFOX plus Avastin 08/07/2015  Cycle 4 FOLFOX plus Avastin 08/21/2015  cycle 5 FOLFOX plus Avastin 09/04/2015  Restaging CTs 09/15/2015 revealed a decrease in the size of liver lesions, decreased rectal primary, stable/decreased size of tiny lung nodules  Cycle 6 FOLFOX plus Avastin 09/18/2015  Cycle 7 FOLFOX plus Avastin 10/02/2015  Cycle 8 FOLFOX plus Avastin 10/16/2015  Cycle 9 FOLFOX plus Avastin 11/06/2015  Cycle 10 FOLFOX plus Avastin 11/20/2015  CTs 12/07/2015-mild decrease in wall thickening at the rectum, decrease in tiny hepatic metastases, stable lung nodules, no evidence of progressive metastatic disease, new left lower lobe infiltrate  Treatment changed to every three-week Avastin with every other week Xeloda beginning 12/11/2015  Restaging CTs 04/12/2016-interval increase in size of adjacent right upper lobe pulmonary nodules and right lower lobe pulmonary nodule; unchanged 5 mm lesion right  hepatic lobe; no additional visualized hepatic metastasis. Grossly unchanged wall thickening of the rectum.  Continuation of Xeloda every other week and every three-week AvastinXeloda placed on hold 05/06/2016 due to hand-foot syndrome  Xeloda resumed 05/28/2016 1500 mg every morning and 1000 mg every  afternoon 7 days on/7 days off  CT 09/27/2016-enlargement of lung nodules and new liver lesions, stable rectal mass  Cycle 1 FOLFIRI/panitumumab 10/08/2016  Cycle 2 panitumumab 10/23/2016 (FOLFIRI held secondary to neutropenia)  Cycle 3 FOLFIRI/PANITUMUMAB 11/04/2016  Cycle 4 FOLFIRI/panitumumab 11/18/2016  Cycle 5 FOLFIRI/panitumumab 12/16/2016  Cycle 6 FOLFIRI/panitumumab 12/31/2016  CTs 01/16/2017-decreased size of pulmonary nodules and liver lesions. Decreased perirectal lymph node  Cycle 7 FOLFIRI/PANITUMUMAB 01/28/2017  Cycle 8FOLFIRI/PANITUMUMAB 02/10/2017  Cycle 9 FOLFIRI/PANITUMUMAB 02/24/2017  Cycle 10 FOLFIRI/panitumumab 03/10/2017 (irinotecan held secondary to neutropenia)  Cycle 11 FOLFIRI/panitumumab 03/24/2017  Cycle12 FOLFIRI/Panitumumab 04/07/2017  Restaging CTs 04/25/2017-stable rectal mass with adjacent 8 mm left perirectal nodal metastasis. Prior hepatic metastases not visualized on the current study. Indeterminate 16 mm enhancing lesion present in segment 6, indeterminate. Minimal residual nodularity in the right upper lobe. No new/progressive pulmonary metastases.  Maintenance Xeloda/panitumumab 04/28/2017  Restaging CTs 08/18/2017-new and enlarging bilateral lung nodules; 2 hyperattenuating lesions in the liver appear similar to prior study; progression of wall thickening in the rectum with enlarging perirectal lymph nodes.  Initiation of radiation/Xeloda 09/01/2017, completed 09/22/2017  Cycle 1 FOLFIRI/Panitumumab 10/13/2017  Cycle 2 FOLFIRI 11/03/2017  Cycle 3 FOLFIRI 11/17/2017  Cycle 4 FOLFIRI 12/01/2017  Cycle 5 FOLFIRI 12/15/2017  CTs7/26/2019-mild progression lungs and liver; significantly improved rectal wall thickening; resolution of perirectal nodal adenopathy.  Cycle 6 FOLFIRI 12/29/2017  Cycle 7 FOLFIRI 01/12/2018  Cycle 8 FOLFIRI 01/26/2018  Cycle 9 FOLFIRI 02/09/2018  Cycle 10 FOLFIRI 02/23/2018  Restaging CTs 03/06/2018-majority  of the pulmonary nodules are similar in size, at least 2 of the nodules are minimally increased in size. Similar appearing lesions within the liver. Similar-appearing mild wall thickening of the rectum.  Cycle 11FOLFIRI 03/09/2018  Cycle 12 FOLFIRI 03/23/2018  Cycle 13 FOLFIRI 04/06/2018  Cycle 14 FOLFIRI 04/20/2018  Cycle 15 FOLFIRI 05/04/2018  CT 05/15/2018-no change in pulmonary nodules, mild increase in size of 2 liver lesions, stable rectal wall thickening  On study at [facility] [place] with palbociclib andUlixertinibbeginning 07/15/2018  [facility]  Restaging CTs 10/07/2018-enlargement of multiple lateral pulmonary nodules and liver lesions; redemonstrated circumferential thickening of the mid rectum with interval enlargement of an irregular nodule of the left perirectal fat.  Cycle 1 FOLFOX 10/12/2018  Cycle 2 FOLFOX 11/02/2018 (allergic reaction to oxaliplatin, oxaliplatin discontinued)  Guardant testing 11/30/2018-KRAS Q61H,EGFR amplification, MSI high not detected(NOTE:Plan for repeat guardant testing at a 77-monthinterval)  Cycle 1 Lonsurf 01/11/2019  Cycle 2 Lonsurf 02/08/2019, dose reduced  Every 2-week Avastin beginning 02/09/2019  Cycle 3 Lonsurf 03/08/2019  Cycle 4 Lonsurf 04/12/2019  CTs 05/07/2019-significant disease progression in the liver, minimal increase in pulmonary metastatic disease, increase in left perirectal nodularity, persistent wall thickening in the lower rectum  Repeat guardant 360 testing 06/03/2019-K-ras Q61H alteration, tumor mutation burden 64, MSI-high not detected 2.Multiple colon polyps on the colonoscopy 05/22/2015, tubular villous adenoma and a tubular adenoma were removed 3.Anemia-likely secondary to rectal bleeding.  4.Family history of multiple cancers including breast and prostate cancer 5.Transient right arm numbness 06/20/2015 6. Port-A-Cath placement 07/06/2015 by Dr. TMarcello Moores7. Mild neutropenia secondary to  chemotherapy following cycle 1 FOLFOX. Neulasta added with cycle 2. 8. Delayed nausea following chemotherapy-emend added with cycle  3 FOLFOX;Emend added with cycle 12 FOLFIRI. 9. Early oxaliplatin neuropathy 10. 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 Xeloda resumed at a reduced dose. 11. Bilateral hand weakness. Question related to previous oxaliplatin. Referred to neurology 06/17/2016. 12. Hypertension-amlodipine started 07/08/2016 13. Rash secondary to PANITUMUMAB.  14. Neutropenia following cycle 1 FOLFIRI/PANITUMUMAB; neutropenia following cycle 4 FOLFIRI/PANITUMUMAB. 15.Neutropenia secondary to Lonsurf-cycle 4 of Lonsurf delayed until 04/12/2019      Disposition: [name] appears stable.  He will not be a candidate for the EGFR directed trial at [place] secondary to the persistent K-ras alteration.  Dr. Reynaldo Minium recommends proceeding with FOLFIRI.  The tumor mutation burden returned high on the recent peripheral blood guardant testing, but the tumor mutation burden was low on foundation 1 testing on a liver biopsy in 2017.  We will schedule a repeat liver biopsy to obtain tissue for repeat foundation 1 testing.  If the tumor mutation burden returns high he will be a candidate for immunotherapy.  [name] will complete treatment with FOLFIRI today.  We reviewed potential toxicities associated with FOLFIRI and G-CSF.  He agrees to proceed.  He will return for office visit and chemotherapy in 2 weeks.  Betsy Coder, MD  06/14/2019  8:49 AM

## 2019-06-15 ENCOUNTER — Telehealth: Payer: Self-pay | Admitting: Oncology

## 2019-06-15 NOTE — Telephone Encounter (Signed)
Scheduled per los. Called and spoke with patient. Confirmed appts  

## 2019-06-16 ENCOUNTER — Other Ambulatory Visit: Payer: Self-pay

## 2019-06-16 ENCOUNTER — Inpatient Hospital Stay: Payer: Medicare HMO

## 2019-06-16 VITALS — BP 112/79 | HR 76 | Temp 98.5°F | Resp 17

## 2019-06-16 DIAGNOSIS — C787 Secondary malignant neoplasm of liver and intrahepatic bile duct: Secondary | ICD-10-CM | POA: Diagnosis not present

## 2019-06-16 DIAGNOSIS — R110 Nausea: Secondary | ICD-10-CM | POA: Diagnosis not present

## 2019-06-16 DIAGNOSIS — C20 Malignant neoplasm of rectum: Secondary | ICD-10-CM

## 2019-06-16 DIAGNOSIS — D701 Agranulocytosis secondary to cancer chemotherapy: Secondary | ICD-10-CM | POA: Diagnosis not present

## 2019-06-16 DIAGNOSIS — G620 Drug-induced polyneuropathy: Secondary | ICD-10-CM | POA: Diagnosis not present

## 2019-06-16 DIAGNOSIS — T451X5A Adverse effect of antineoplastic and immunosuppressive drugs, initial encounter: Secondary | ICD-10-CM | POA: Diagnosis not present

## 2019-06-16 DIAGNOSIS — D649 Anemia, unspecified: Secondary | ICD-10-CM | POA: Diagnosis not present

## 2019-06-16 DIAGNOSIS — Z5111 Encounter for antineoplastic chemotherapy: Secondary | ICD-10-CM | POA: Diagnosis not present

## 2019-06-16 DIAGNOSIS — R918 Other nonspecific abnormal finding of lung field: Secondary | ICD-10-CM | POA: Diagnosis not present

## 2019-06-16 MED ORDER — PEGFILGRASTIM-JMDB 6 MG/0.6ML ~~LOC~~ SOSY
6.0000 mg | PREFILLED_SYRINGE | Freq: Once | SUBCUTANEOUS | Status: AC
  Administered 2019-06-16: 6 mg via SUBCUTANEOUS

## 2019-06-16 MED ORDER — HEPARIN SOD (PORK) LOCK FLUSH 100 UNIT/ML IV SOLN
500.0000 [IU] | Freq: Once | INTRAVENOUS | Status: AC | PRN
  Administered 2019-06-16: 500 [IU]
  Filled 2019-06-16: qty 5

## 2019-06-16 MED ORDER — PEGFILGRASTIM-JMDB 6 MG/0.6ML ~~LOC~~ SOSY
PREFILLED_SYRINGE | SUBCUTANEOUS | Status: AC
  Filled 2019-06-16: qty 0.6

## 2019-06-16 MED ORDER — SODIUM CHLORIDE 0.9% FLUSH
10.0000 mL | INTRAVENOUS | Status: DC | PRN
  Administered 2019-06-16: 10 mL
  Filled 2019-06-16: qty 10

## 2019-06-20 ENCOUNTER — Other Ambulatory Visit: Payer: Self-pay | Admitting: Radiology

## 2019-06-21 ENCOUNTER — Other Ambulatory Visit: Payer: Self-pay | Admitting: Radiology

## 2019-06-22 ENCOUNTER — Encounter (HOSPITAL_COMMUNITY): Payer: Self-pay

## 2019-06-22 ENCOUNTER — Ambulatory Visit (HOSPITAL_COMMUNITY)
Admission: RE | Admit: 2019-06-22 | Discharge: 2019-06-22 | Disposition: A | Discharge status: Home / Self Care | Payer: Medicare HMO | Source: Ambulatory Visit | Attending: Oncology | Admitting: Oncology

## 2019-06-22 ENCOUNTER — Other Ambulatory Visit: Payer: Self-pay

## 2019-06-22 DIAGNOSIS — C20 Malignant neoplasm of rectum: Secondary | ICD-10-CM | POA: Insufficient documentation

## 2019-06-22 DIAGNOSIS — Z803 Family history of malignant neoplasm of breast: Secondary | ICD-10-CM | POA: Insufficient documentation

## 2019-06-22 DIAGNOSIS — Z79899 Other long term (current) drug therapy: Secondary | ICD-10-CM | POA: Diagnosis not present

## 2019-06-22 DIAGNOSIS — K219 Gastro-esophageal reflux disease without esophagitis: Secondary | ICD-10-CM | POA: Insufficient documentation

## 2019-06-22 DIAGNOSIS — Z87891 Personal history of nicotine dependence: Secondary | ICD-10-CM | POA: Diagnosis not present

## 2019-06-22 DIAGNOSIS — C189 Malignant neoplasm of colon, unspecified: Secondary | ICD-10-CM | POA: Diagnosis not present

## 2019-06-22 DIAGNOSIS — Z8042 Family history of malignant neoplasm of prostate: Secondary | ICD-10-CM | POA: Diagnosis not present

## 2019-06-22 DIAGNOSIS — C801 Malignant (primary) neoplasm, unspecified: Secondary | ICD-10-CM | POA: Diagnosis not present

## 2019-06-22 DIAGNOSIS — Z8249 Family history of ischemic heart disease and other diseases of the circulatory system: Secondary | ICD-10-CM | POA: Diagnosis not present

## 2019-06-22 DIAGNOSIS — Z888 Allergy status to other drugs, medicaments and biological substances status: Secondary | ICD-10-CM | POA: Insufficient documentation

## 2019-06-22 DIAGNOSIS — Z833 Family history of diabetes mellitus: Secondary | ICD-10-CM | POA: Insufficient documentation

## 2019-06-22 DIAGNOSIS — K769 Liver disease, unspecified: Secondary | ICD-10-CM | POA: Diagnosis present

## 2019-06-22 DIAGNOSIS — C787 Secondary malignant neoplasm of liver and intrahepatic bile duct: Secondary | ICD-10-CM | POA: Insufficient documentation

## 2019-06-22 DIAGNOSIS — Z841 Family history of disorders of kidney and ureter: Secondary | ICD-10-CM | POA: Insufficient documentation

## 2019-06-22 LAB — CBC WITH DIFFERENTIAL/PLATELET
Abs Immature Granulocytes: 0.16 10*3/uL — ABNORMAL HIGH (ref 0.00–0.07)
Basophils Absolute: 0 10*3/uL (ref 0.0–0.1)
Basophils Relative: 0 %
Eosinophils Absolute: 0.1 10*3/uL (ref 0.0–0.5)
Eosinophils Relative: 1 %
HCT: 37.3 % — ABNORMAL LOW (ref 39.0–52.0)
Hemoglobin: 11.9 g/dL — ABNORMAL LOW (ref 13.0–17.0)
Immature Granulocytes: 2 %
Lymphocytes Relative: 11 %
Lymphs Abs: 1.1 10*3/uL (ref 0.7–4.0)
MCH: 34.4 pg — ABNORMAL HIGH (ref 26.0–34.0)
MCHC: 31.9 g/dL (ref 30.0–36.0)
MCV: 107.8 fL — ABNORMAL HIGH (ref 80.0–100.0)
Monocytes Absolute: 1 10*3/uL (ref 0.1–1.0)
Monocytes Relative: 10 %
Neutro Abs: 7.5 10*3/uL (ref 1.7–7.7)
Neutrophils Relative %: 76 %
Platelets: 189 10*3/uL (ref 150–400)
RBC: 3.46 MIL/uL — ABNORMAL LOW (ref 4.22–5.81)
RDW: 14.9 % (ref 11.5–15.5)
WBC: 9.9 10*3/uL (ref 4.0–10.5)
nRBC: 0 % (ref 0.0–0.2)

## 2019-06-22 LAB — PROTIME-INR
INR: 1.1 (ref 0.8–1.2)
Prothrombin Time: 13.7 seconds (ref 11.4–15.2)

## 2019-06-22 LAB — COMPREHENSIVE METABOLIC PANEL
ALT: 18 U/L (ref 0–44)
AST: 22 U/L (ref 15–41)
Albumin: 3.7 g/dL (ref 3.5–5.0)
Alkaline Phosphatase: 118 U/L (ref 38–126)
Anion gap: 7 (ref 5–15)
BUN: 14 mg/dL (ref 8–23)
CO2: 26 mmol/L (ref 22–32)
Calcium: 9 mg/dL (ref 8.9–10.3)
Chloride: 108 mmol/L (ref 98–111)
Creatinine, Ser: 0.99 mg/dL (ref 0.61–1.24)
GFR calc Af Amer: >60 mL/min (ref >60)
GFR calc non Af Amer: >60 mL/min (ref >60)
Glucose, Bld: 110 mg/dL — ABNORMAL HIGH (ref 70–99)
Potassium: 3.9 mmol/L (ref 3.5–5.1)
Sodium: 141 mmol/L (ref 135–145)
Total Bilirubin: 0.6 mg/dL (ref 0.3–1.2)
Total Protein: 7.9 g/dL (ref 6.5–8.1)

## 2019-06-22 MED ORDER — LIDOCAINE HCL 1 % IJ SOLN
INTRAMUSCULAR | Status: AC
  Filled 2019-06-22: qty 20

## 2019-06-22 MED ORDER — MIDAZOLAM HCL 2 MG/2ML IJ SOLN
INTRAMUSCULAR | Status: AC | PRN
  Administered 2019-06-22 (×2): 1 mg via INTRAVENOUS

## 2019-06-22 MED ORDER — GELATIN ABSORBABLE 12-7 MM EX MISC
CUTANEOUS | Status: AC
  Filled 2019-06-22: qty 1

## 2019-06-22 MED ORDER — FENTANYL CITRATE (PF) 100 MCG/2ML IJ SOLN
INTRAMUSCULAR | Status: AC
  Filled 2019-06-22: qty 2

## 2019-06-22 MED ORDER — FENTANYL CITRATE (PF) 100 MCG/2ML IJ SOLN
INTRAMUSCULAR | Status: AC | PRN
  Administered 2019-06-22: 50 ug via INTRAVENOUS

## 2019-06-22 MED ORDER — SODIUM CHLORIDE 0.9 % IV SOLN: INTRAVENOUS | Status: DC

## 2019-06-22 MED ORDER — MIDAZOLAM HCL 2 MG/2ML IJ SOLN
INTRAMUSCULAR | Status: AC
  Filled 2019-06-22: qty 2

## 2019-06-22 MED ORDER — LIDOCAINE HCL (PF) 1 % IJ SOLN
INTRAMUSCULAR | Status: AC | PRN
  Administered 2019-06-22: 10 mL

## 2019-06-22 NOTE — H&P (Signed)
Chief Complaint: Patient was seen in consultation today for liver biopsy at the request of Sherrill,Gary B  Referring Physician(s): Ladell Pier  Supervising Physician: Daryll Brod  Patient Status: [facility] - Out-pt  History of Present Illness: Hunter Walker is a 69 y.o. male with metastatic rectal cancer. Found to have disease progression including increasing liver masses. He is referred for image guided biopsy of liver mass. PMHx, meds, labs, imaging, allergies reviewed. Feels well, no recent fevers, chills, illness. Has been NPO today as directed.   Past Medical History:  Diagnosis Date  . Allergic rhinitis   . Cancer [facility])    rectal cancer  . GERD (gastroesophageal reflux disease)   . Hemorrhoids     Past Surgical History:  Procedure Laterality Date  . EUS N/A 06/01/2015   Procedure: LOWER ENDOSCOPIC ULTRASOUND (EUS);  Surgeon: Milus Banister, MD;  Location: [facility];  Service: Endoscopy;  Laterality: N/A;  . LIVER BIOPSY  06/26/2015  . NO PAST SURGERIES    . PORTACATH PLACEMENT Right 07/06/2015   Procedure: INSERTION PORT-A-CATH;  Surgeon: Leighton Ruff, MD;  Location: [facility];  Service: General;  Laterality: Right;    Allergies: Oxaliplatin  Medications: Prior to Admission medications   Medication Sig Start Date End Date Taking? Authorizing Provider  amLODipine (NORVASC) 5 MG tablet Take 1 tablet (5 mg total) by mouth daily. 04/27/19   Owens Shark, NP  famotidine (PEPCID) 20 MG tablet Take 1 tablet (20 mg total) by mouth 2 (two) times daily. 12/20/18   Milton Ferguson, MD  ferrous sulfate 325 (65 FE) MG tablet Take 325 mg by mouth daily with breakfast.     [provider]  gabapentin (NEURONTIN) 300 MG capsule Take 1 capsule (300 mg total) by mouth 3 (three) times daily. 10/22/18   [name], MD  ondansetron (ZOFRAN) 8 MG tablet Take 1 tablet (8 mg total) by mouth every 8 (eight) hours as needed for nausea or vomiting. Patient  not taking: Reported on 06/14/2019 05/04/18   Owens Shark, NP  oxyCODONE-acetaminophen (PERCOCET/ROXICET) 5-325 MG tablet Take 1-2 tablets by mouth every 4 (four) hours as needed for severe pain. Patient not taking: Reported on 06/14/2019 05/11/19   Ladell Pier, MD  pantoprazole (PROTONIX) 40 MG tablet Take 1 tablet (40 mg total) by mouth daily. 06/14/19   Ladell Pier, MD  polyethylene glycol (MIRALAX / Floria Raveling) packet Take 17 g by mouth daily as needed.     [provider]  prochlorperazine (COMPAZINE) 10 MG tablet TAKE 1 TABLET (10 MG TOTAL) BY MOUTH EVERY 6 (SIX) HOURS AS NEEDED FOR NAUSEA OR VOMITING. Patient not taking: Reported on 06/14/2019 02/23/19   Owens Shark, NP     Family History  Problem Relation Age of Onset  . Breast cancer Sister        x 2  . Diabetes Mother   . Kidney disease Mother   . Prostate cancer Father   . Diabetes Father   . Prostate cancer Brother   . Diabetes Sister   . Heart disease Brother   . Colon cancer Neg Hx     Social History   Socioeconomic History  . Marital status: Married    Spouse name: Not on file  . Number of children: 2  . Years of education: college  . Highest education level: Not on file  Occupational History  . Occupation: retired Pharmacist, hospital  Tobacco Use  . Smoking status: Former Smoker  Packs/day: 1.00    Years: 10.00    Pack years: 10.00    Types: Cigarettes    Quit date: 06/03/1978    Years since quitting: 41.0  . Smokeless tobacco: Never Used  Substance and Sexual Activity  . Alcohol use: Yes    Alcohol/week: 0.0 standard drinks    Comment: occ  . Drug use: No  . Sexual activity: Not on file  Other Topics Concern  . Not on file  Social History Narrative   Married, wife Sharyn Lull.  Lives in a 2 story home.     #2 daughters: Mendel Ryder and Waynard Reeds   Retired Pharmacist, hospital in 2017.  Education: college      Social Determinants of Radio broadcast assistant Strain:   . Difficulty of Paying Living  Expenses: Not on file  Food Insecurity:   . Worried About Charity fundraiser in the Last Year: Not on file  . Ran Out of Food in the Last Year: Not on file  Transportation Needs:   . Lack of Transportation (Medical): Not on file  . Lack of Transportation (Non-Medical): Not on file  Physical Activity:   . Days of Exercise per Week: Not on file  . Minutes of Exercise per Session: Not on file  Stress:   . Feeling of Stress : Not on file  Social Connections:   . Frequency of Communication with Friends and Family: Not on file  . Frequency of Social Gatherings with Friends and Family: Not on file  . Attends Religious Services: Not on file  . Active Member of Clubs or Organizations: Not on file  . Attends Archivist Meetings: Not on file  . Marital Status: Not on file     Review of Systems: A 12 point ROS discussed and pertinent positives are indicated in the HPI above.  All other systems are negative.  Review of Systems  Vital Signs: BP 124/81   Pulse 88   Temp 98.7 F (37.1 C) (Oral)   Resp 16   SpO2 100%   Physical Exam Constitutional:      Appearance: Normal appearance. He is not ill-appearing.  HENT:     Head: Normocephalic.     Mouth/Throat:     Mouth: Mucous membranes are moist.     Pharynx: Oropharynx is clear.  Cardiovascular:     Rate and Rhythm: Normal rate and regular rhythm.     Heart sounds: Normal heart sounds.  Pulmonary:     Effort: Pulmonary effort is normal. No respiratory distress.     Breath sounds: Normal breath sounds.  Abdominal:     General: Abdomen is flat. There is no distension.     Palpations: Abdomen is soft. There is no mass.     Tenderness: There is no abdominal tenderness.  Skin:    General: Skin is warm and dry.  Neurological:     General: No focal deficit present.     Mental Status: He is alert and oriented to person, place, and time.  Psychiatric:        Mood and Affect: Mood normal.        Thought Content: Thought  content normal.        Judgment: Judgment normal.     Imaging: No results found.  Labs:  CBC: Recent Labs    03/30/19 1445 04/12/19 1128 04/27/19 1311 06/14/19 0821  WBC 2.1* 4.2 2.3* 8.1  HGB 9.3* 10.3* 9.1* 12.1*  HCT 28.6* 31.9* 27.7* 36.7*  PLT 161 207 160 297    COAGS: No results for input(s): INR, APTT in the last 8760 hours.  BMP: Recent Labs    02/23/19 1411 03/09/19 1159 04/27/19 1311 06/14/19 0821  NA 136 138 139 138  K 4.3 4.2 4.0 4.3  CL 104 106 107 103  CO2 26 25 25 26   GLUCOSE 104* 116* 101* 89  BUN 15 14 16 18   CALCIUM 8.9 9.1 8.5* 9.2  CREATININE 1.25* 1.04 1.06 1.31*  GFRNONAA 59* >60 >60 56*  GFRAA >60 >60 >60 >60    LIVER FUNCTION TESTS: Recent Labs    02/23/19 1411 03/09/19 1159 04/27/19 1311 06/14/19 0821  BILITOT 0.4 0.4 0.4 0.3  AST 19 18 18 28   ALT 14 10 14 18   ALKPHOS 103 93 87 107  PROT 8.0 7.9 7.3 8.6*  ALBUMIN 3.8 3.6 3.6 3.8    TUMOR MARKERS: No results for input(s): AFPTM, CEA, CA199, CHROMGRNA in the last 8760 hours.  Assessment and Plan: Metastatic rectal cancer with enlarging liver lesions. For US guided biopsy Labs pending Risks and benefits of liver bx was discussed with the patient and/or patient's family including, but not limited to bleeding, infection, damage to adjacent structures or low yield requiring additional tests.  All of the questions were answered and there is agreement to proceed.  Consent signed and in chart.    Thank you for this interesting consult.  I greatly enjoyed meeting KENNEN PLEASANTS and look forward to participating in their care.  A copy of this report was sent to the requesting provider on this date.  Electronically Signed: Ascencion Dike, PA-C 06/22/2019, 12:23 PM   I spent a total of 20 minutes in face to face in clinical consultation, greater than 50% of which was counseling/coordinating care for liver bx

## 2019-06-22 NOTE — Discharge Instructions (Signed)
Urgent Needs, On call IR MD [phone] (day of procedure)  Dressing - May remove bandaid and shower in 24 hours.  Replace with new bandaid as necessary.  Keep site clean and dry.    Liver Biopsy, Care After These instructions give you information about how to care for yourself after your procedure. Your health care provider may also give you more specific instructions. If you have problems or questions, contact your health care provider. What can I expect after the procedure? After your procedure, it is common to have:  Pain and soreness in the area where the biopsy was done.  Bruising around the area where the biopsy was done.  Sleepiness and fatigue for 1-2 days. Follow these instructions at home: Medicines  Take over-the-counter and prescription medicines only as told by your health care provider.  If you were prescribed an antibiotic medicine, take it as told by your health care provider. Do not stop taking the antibiotic even if you start to feel better.  Do not take medicines such as aspirin and ibuprofen unless your health care provider tells you to take them. These medicines thin your blood and can increase the risk of bleeding.  If you are taking prescription pain medicine, take actions to prevent or treat constipation. Your health care provider may recommend that you: ? Drink enough fluid to keep your urine pale yellow. ? Eat foods that are high in fiber, such as fresh fruits and vegetables, whole grains, and beans. ? Limit foods that are high in fat and processed sugars, such as fried or sweet foods. ? Take an over-the-counter or prescription medicine for constipation. Incision care  Follow instructions from your health care provider about how to take care of your incision. Make sure you: ? Wash your hands with soap and water before you change your bandage (dressing). If soap and water are not available, use hand sanitizer. ? Change your dressing as told by your health  care provider. ? Leave stitches (sutures), skin glue, or adhesive strips in place. These skin closures may need to stay in place for 2 weeks or longer. If adhesive strip edges start to loosen and curl up, you may trim the loose edges. Do not remove adhesive strips completely unless your health care provider tells you to do that.  Check your incision area every day for signs of infection. Check for: ? Redness, swelling, or pain. ? Fluid or blood. ? Warmth. ? Pus or a bad smell.  Do not take baths, swim, or use a hot tub until your health care provider says it is okay to do so. Activity   Rest at home for 1-2 days, or as directed by your health care provider. ? Avoid sitting for a long time without moving. Get up to take short walks every 1-2 hours. This is important to improve blood flow and breathing. Ask for help if you feel weak or unsteady.  Return to your normal activities as told by your health care provider. Ask your health care provider what activities are safe for you.  Do not drive or use heavy machinery while taking prescription pain medicine.  Do not lift anything that is heavier than 10 lb (4.5 kg), or the limit that your health care provider tells you, until he or she says that it is safe.  Do not play contact sports for 2 weeks after the procedure. General instructions  Do not drink alcohol in the first week after the procedure.  Have someone stay with  you for at least 24 hours after the procedure.  It is your responsibility to obtain your test results. Ask your health care provider, or the department that is doing the test: ? When will my results be ready? ? How will I get my results? ? What are my treatment options? ? What other tests do I need? ? What are my next steps?  Keep all follow-up visits as told by your health care provider. This is important. Contact a health care provider if:  You have increased bleeding from an incision, resulting in more than a  small spot of blood.  You have redness, swelling, or increasing pain in any incisions.  You notice a discharge or a bad smell coming from any of your incisions.  You have a fever or chills. Get help right away if:  You develop swelling, bloating, or pain in your abdomen.  You become dizzy or faint.  You develop a rash.  You have nausea or you vomit.  You faint, or you have shortness of breath or difficulty breathing.  You develop chest pain.  You have problems with your speech or vision.  You have trouble with your balance or moving your arms or legs. Summary  After the liver biopsy, it is common to have pain, soreness, and bruising in the area, as well as sleepiness and fatigue.  Take over-the-counter and prescription medicines only as told by your health care provider.  Follow instructions from your health care provider about how to care for your incision. Check the incision area daily for signs of infection. This information is not intended to replace advice given to you by your health care provider. Make sure you discuss any questions you have with your health care provider. Document Revised: 07/13/2018 Document Reviewed: 05/30/2017 Elsevier Patient Education  [employer].   Moderate Conscious Sedation, Adult, Care After These instructions provide you with information about caring for yourself after your procedure. Your health care provider may also give you more specific instructions. Your treatment has been planned according to current medical practices, but problems sometimes occur. Call your health care provider if you have any problems or questions after your procedure. What can I expect after the procedure? After your procedure, it is common:  To feel sleepy for several hours.  To feel clumsy and have poor balance for several hours.  To have poor judgment for several hours.  To vomit if you eat too soon. Follow these instructions at home: For at least  24 hours after the procedure:  Do not: ? Participate in activities where you could fall or become injured. ? Drive. ? Use heavy machinery. ? Drink alcohol. ? Take sleeping pills or medicines that cause drowsiness. ? Make important decisions or sign legal documents. ? Take care of children on your own.  Rest. Eating and drinking  Follow the diet recommended by your health care provider.  If you vomit: ? Drink water, juice, or soup when you can drink without vomiting. ? Make sure you have little or no nausea before eating solid foods. General instructions  Have a responsible adult stay with you until you are awake and alert.  Take over-the-counter and prescription medicines only as told by your health care provider.  If you smoke, do not smoke without supervision.  Keep all follow-up visits as told by your health care provider. This is important. Contact a health care provider if:  You keep feeling nauseous or you keep vomiting.  You feel light-headed.  You develop a rash.  You have a fever. Get help right away if:  You have trouble breathing. This information is not intended to replace advice given to you by your health care provider. Make sure you discuss any questions you have with your health care provider. Document Revised: 05/02/2017 Document Reviewed: 09/09/2015 Elsevier Patient Education  2020 [employer].

## 2019-06-22 NOTE — Procedures (Signed)
MET COLON CA  S/P US LIVER MET BX  No comp Stable ebl min Path pending Full report in pacs  

## 2019-06-23 LAB — SURGICAL PATHOLOGY

## 2019-06-24 ENCOUNTER — Telehealth: Payer: Self-pay | Admitting: *Deleted

## 2019-06-24 ENCOUNTER — Telehealth: Payer: Self-pay | Admitting: Oncology

## 2019-06-24 NOTE — Telephone Encounter (Addendum)
Reporting he his having intermittent pain, with movement at biopsy site. Pain can be rated 7-8/10. Taking tylenol. Was told by IR not to take his oxycodone for this pain. Asking to change his 1/25 treatment to 2/1 or 2/8. Per Dr. Benay Spice: OK to take oxycodone for biopsy site pain. Prefers to proceed w/tx on 1/25, but if patient wants to delay her advises to be treated on 2/1 and not wait till 2/8. Notified him OK to take oxycodone for pain and to call for increased pain or swelling in area. He agrees to be treated on 07/05/19. High priority scheduling message sent for change

## 2019-06-24 NOTE — Telephone Encounter (Signed)
Called pt per 1/20 sch message = r/s appt from 1/25 to 2/1 per sch message - unable to reach pt at both numbers . Left message on mobile number

## 2019-06-28 ENCOUNTER — Inpatient Hospital Stay: Payer: Medicare HMO

## 2019-06-28 ENCOUNTER — Inpatient Hospital Stay: Payer: Medicare HMO | Admitting: Nurse Practitioner

## 2019-07-04 ENCOUNTER — Other Ambulatory Visit: Payer: Self-pay | Admitting: Oncology

## 2019-07-05 ENCOUNTER — Encounter: Payer: Self-pay | Admitting: Nurse Practitioner

## 2019-07-05 ENCOUNTER — Inpatient Hospital Stay: Payer: Medicare HMO | Attending: Nurse Practitioner

## 2019-07-05 ENCOUNTER — Inpatient Hospital Stay (HOSPITAL_BASED_OUTPATIENT_CLINIC_OR_DEPARTMENT_OTHER): Payer: Medicare HMO | Admitting: Nurse Practitioner

## 2019-07-05 ENCOUNTER — Inpatient Hospital Stay: Payer: Medicare HMO

## 2019-07-05 ENCOUNTER — Other Ambulatory Visit: Payer: Self-pay

## 2019-07-05 VITALS — BP 100/70 | HR 84 | Temp 98.2°F | Resp 16 | Wt 169.9 lb

## 2019-07-05 DIAGNOSIS — Z86010 Personal history of colonic polyps: Secondary | ICD-10-CM | POA: Insufficient documentation

## 2019-07-05 DIAGNOSIS — R918 Other nonspecific abnormal finding of lung field: Secondary | ICD-10-CM | POA: Diagnosis not present

## 2019-07-05 DIAGNOSIS — I10 Essential (primary) hypertension: Secondary | ICD-10-CM | POA: Insufficient documentation

## 2019-07-05 DIAGNOSIS — R110 Nausea: Secondary | ICD-10-CM | POA: Insufficient documentation

## 2019-07-05 DIAGNOSIS — G620 Drug-induced polyneuropathy: Secondary | ICD-10-CM | POA: Insufficient documentation

## 2019-07-05 DIAGNOSIS — C787 Secondary malignant neoplasm of liver and intrahepatic bile duct: Secondary | ICD-10-CM | POA: Insufficient documentation

## 2019-07-05 DIAGNOSIS — Z5111 Encounter for antineoplastic chemotherapy: Secondary | ICD-10-CM | POA: Diagnosis not present

## 2019-07-05 DIAGNOSIS — C20 Malignant neoplasm of rectum: Secondary | ICD-10-CM

## 2019-07-05 DIAGNOSIS — R5381 Other malaise: Secondary | ICD-10-CM | POA: Diagnosis not present

## 2019-07-05 DIAGNOSIS — D649 Anemia, unspecified: Secondary | ICD-10-CM | POA: Diagnosis not present

## 2019-07-05 DIAGNOSIS — Z79899 Other long term (current) drug therapy: Secondary | ICD-10-CM | POA: Diagnosis not present

## 2019-07-05 DIAGNOSIS — R531 Weakness: Secondary | ICD-10-CM | POA: Insufficient documentation

## 2019-07-05 DIAGNOSIS — R160 Hepatomegaly, not elsewhere classified: Secondary | ICD-10-CM | POA: Insufficient documentation

## 2019-07-05 DIAGNOSIS — D701 Agranulocytosis secondary to cancer chemotherapy: Secondary | ICD-10-CM | POA: Diagnosis not present

## 2019-07-05 DIAGNOSIS — Z5189 Encounter for other specified aftercare: Secondary | ICD-10-CM | POA: Insufficient documentation

## 2019-07-05 DIAGNOSIS — T451X5A Adverse effect of antineoplastic and immunosuppressive drugs, initial encounter: Secondary | ICD-10-CM | POA: Diagnosis not present

## 2019-07-05 DIAGNOSIS — Z8042 Family history of malignant neoplasm of prostate: Secondary | ICD-10-CM | POA: Diagnosis not present

## 2019-07-05 DIAGNOSIS — Z803 Family history of malignant neoplasm of breast: Secondary | ICD-10-CM | POA: Diagnosis not present

## 2019-07-05 DIAGNOSIS — Z95828 Presence of other vascular implants and grafts: Secondary | ICD-10-CM

## 2019-07-05 DIAGNOSIS — R21 Rash and other nonspecific skin eruption: Secondary | ICD-10-CM | POA: Diagnosis not present

## 2019-07-05 LAB — CMP (CANCER CENTER ONLY)
ALT: 14 U/L (ref 0–44)
AST: 24 U/L (ref 15–41)
Albumin: 3.2 g/dL — ABNORMAL LOW (ref 3.5–5.0)
Alkaline Phosphatase: 108 U/L (ref 38–126)
Anion gap: 10 (ref 5–15)
BUN: 12 mg/dL (ref 8–23)
CO2: 25 mmol/L (ref 22–32)
Calcium: 9.2 mg/dL (ref 8.9–10.3)
Chloride: 105 mmol/L (ref 98–111)
Creatinine: 1.11 mg/dL (ref 0.61–1.24)
GFR, Est AFR Am: >60 mL/min (ref >60)
GFR, Estimated: >60 mL/min (ref >60)
Glucose, Bld: 122 mg/dL — ABNORMAL HIGH (ref 70–99)
Potassium: 4.1 mmol/L (ref 3.5–5.1)
Sodium: 140 mmol/L (ref 135–145)
Total Bilirubin: <0.2 mg/dL — ABNORMAL LOW (ref 0.3–1.2)
Total Protein: 7.9 g/dL (ref 6.5–8.1)

## 2019-07-05 LAB — CBC WITH DIFFERENTIAL (CANCER CENTER ONLY)
Abs Immature Granulocytes: 0.05 10*3/uL (ref 0.00–0.07)
Basophils Absolute: 0.1 10*3/uL (ref 0.0–0.1)
Basophils Relative: 0 %
Eosinophils Absolute: 0.1 10*3/uL (ref 0.0–0.5)
Eosinophils Relative: 1 %
HCT: 33.5 % — ABNORMAL LOW (ref 39.0–52.0)
Hemoglobin: 10.8 g/dL — ABNORMAL LOW (ref 13.0–17.0)
Immature Granulocytes: 0 %
Lymphocytes Relative: 10 %
Lymphs Abs: 1.4 10*3/uL (ref 0.7–4.0)
MCH: 33.1 pg (ref 26.0–34.0)
MCHC: 32.2 g/dL (ref 30.0–36.0)
MCV: 102.8 fL — ABNORMAL HIGH (ref 80.0–100.0)
Monocytes Absolute: 0.9 10*3/uL (ref 0.1–1.0)
Monocytes Relative: 7 %
Neutro Abs: 10.7 10*3/uL — ABNORMAL HIGH (ref 1.7–7.7)
Neutrophils Relative %: 82 %
Platelet Count: 276 10*3/uL (ref 150–400)
RBC: 3.26 MIL/uL — ABNORMAL LOW (ref 4.22–5.81)
RDW: 15.6 % — ABNORMAL HIGH (ref 11.5–15.5)
WBC Count: 13.2 10*3/uL — ABNORMAL HIGH (ref 4.0–10.5)
nRBC: 0 % (ref 0.0–0.2)

## 2019-07-05 LAB — CEA (IN HOUSE-CHCC): CEA (CHCC-In House): 186.69 ng/mL — ABNORMAL HIGH (ref 0.00–5.00)

## 2019-07-05 MED ORDER — PALONOSETRON HCL INJECTION 0.25 MG/5ML
INTRAVENOUS | Status: AC
  Filled 2019-07-05: qty 5

## 2019-07-05 MED ORDER — DEXAMETHASONE SODIUM PHOSPHATE 10 MG/ML IJ SOLN
INTRAMUSCULAR | Status: AC
  Filled 2019-07-05: qty 1

## 2019-07-05 MED ORDER — PALONOSETRON HCL INJECTION 0.25 MG/5ML
0.2500 mg | Freq: Once | INTRAVENOUS | Status: AC
  Administered 2019-07-05: 0.25 mg via INTRAVENOUS

## 2019-07-05 MED ORDER — SODIUM CHLORIDE 0.9% FLUSH
10.0000 mL | Freq: Once | INTRAVENOUS | Status: AC
  Administered 2019-07-05: 10 mL
  Filled 2019-07-05: qty 10

## 2019-07-05 MED ORDER — DEXAMETHASONE SODIUM PHOSPHATE 10 MG/ML IJ SOLN
10.0000 mg | Freq: Once | INTRAMUSCULAR | Status: AC
  Administered 2019-07-05: 10 mg via INTRAVENOUS

## 2019-07-05 MED ORDER — SODIUM CHLORIDE 0.9 % IV SOLN
2463.0000 mg/m2 | INTRAVENOUS | Status: DC
  Administered 2019-07-05: 5000 mg via INTRAVENOUS
  Filled 2019-07-05: qty 100

## 2019-07-05 MED ORDER — FLUOROURACIL CHEMO INJECTION 2.5 GM/50ML
300.0000 mg/m2 | Freq: Once | INTRAVENOUS | Status: AC
  Administered 2019-07-05: 600 mg via INTRAVENOUS
  Filled 2019-07-05: qty 12

## 2019-07-05 MED ORDER — ATROPINE SULFATE 1 MG/ML IJ SOLN
0.5000 mg | Freq: Once | INTRAMUSCULAR | Status: AC | PRN
  Administered 2019-07-05: 0.5 mg via INTRAVENOUS

## 2019-07-05 MED ORDER — SODIUM CHLORIDE 0.9 % IV SOLN
125.0000 mg/m2 | Freq: Once | INTRAVENOUS | Status: AC
  Administered 2019-07-05: 260 mg via INTRAVENOUS
  Filled 2019-07-05: qty 13

## 2019-07-05 MED ORDER — ATROPINE SULFATE 1 MG/ML IJ SOLN
INTRAMUSCULAR | Status: AC
  Filled 2019-07-05: qty 1

## 2019-07-05 MED ORDER — OXYCODONE-ACETAMINOPHEN 5-325 MG PO TABS: 1.0000 | ORAL_TABLET | ORAL | 0 refills | Status: DC | PRN

## 2019-07-05 MED ORDER — SODIUM CHLORIDE 0.9 % IV SOLN
Freq: Once | INTRAVENOUS | Status: AC
  Filled 2019-07-05: qty 250

## 2019-07-05 MED ORDER — SODIUM CHLORIDE 0.9 % IV SOLN
300.0000 mg/m2 | Freq: Once | INTRAVENOUS | Status: AC
  Administered 2019-07-05: 606 mg via INTRAVENOUS
  Filled 2019-07-05: qty 30.3

## 2019-07-05 NOTE — Progress Notes (Addendum)
**Hunter Hunter** Hunter Hunter   Diagnosis: Rectal cancer  INTERVAL HISTORY:   Hunter Hunter returns as scheduled.  He completed cycle 1 FOLFIRI 06/14/2019.  He has had a few episodes of nausea/vomiting.  No mouth sores.  No diarrhea.  Overall rectal pain is better.  He reports appetite was diminished following the liver biopsy and is now beginning to improve.  He had significant pain for about 1 week after the biopsy.  The pain has resolved.  No lightheadedness or dizziness.  Objective:  Vital signs in last 24 hours:  Blood pressure 100/70, pulse 84, temperature 98.2 F (36.8 C), temperature source Temporal, resp. rate 16, weight 169 lb 14.4 oz (77.1 kg), SpO2 100 %.    HEENT: No thrush or ulcers.  Tongue appears dry. GI: Abdomen is mildly distended.  Nontender.  No hepatomegaly. Vascular: No leg edema.  Skin: Palms without erythema. Port-A-Cath without erythema.   Lab Results:  Lab Results  Component Value Date   WBC 13.2 (H) 07/05/2019   HGB 10.8 (L) 07/05/2019   HCT 33.5 (L) 07/05/2019   MCV 102.8 (H) 07/05/2019   PLT 276 07/05/2019   NEUTROABS 10.7 (H) 07/05/2019    Imaging:  No results found.  Medications: I have reviewed the patient's current medications.  Assessment/Plan: 1.Rectal cancer, invasive adenocarcinoma confirmed at colonoscopy 05/22/2015, EUS staging 06/01/2015 revealed a uT3,N1 lesion at 7 cm from the anal verge  Staging CT abdomen/pelvis 05/15/2015 confirmed a mass at the rectosigmoid colon, a suspicious perirectal lymph node, and a 6 mm right liver lesion felt to most likely be a cyst  CT chest 05/30/2015 with indeterminate pulmonary nodules and multiple liver lesions suspicious for metastases  Ultrasound 06/13/2014 with no liver lesions identified  MRI abdomen 06/21/2015 consistent with multiple liver metastases  CT biopsy of liver lesion 06/26/2015. Pathology showed metastatic adenocarcinoma consistent with colonic  primary.MSI-stable, low mutation burden, no RAS or BRAFmutation  Cycle 1 FOLFOX 07/10/2015  Cycle 2 FOLFOX plus Avastin 07/24/2015 with Neulasta support  Cycle 3 FOLFOX plus Avastin 08/07/2015  Cycle 4 FOLFOX plus Avastin 08/21/2015  cycle 5 FOLFOX plus Avastin 09/04/2015  Restaging CTs 09/15/2015 revealed a decrease in the size of liver lesions, decreased rectal primary, stable/decreased size of tiny lung nodules  Cycle 6 FOLFOX plus Avastin 09/18/2015  Cycle 7 FOLFOX plus Avastin 10/02/2015  Cycle 8 FOLFOX plus Avastin 10/16/2015  Cycle 9 FOLFOX plus Avastin 11/06/2015  Cycle 10 FOLFOX plus Avastin 11/20/2015  CTs 12/07/2015-mild decrease in wall thickening at the rectum, decrease in tiny hepatic metastases, stable lung nodules, no evidence of progressive metastatic disease, new left lower lobe infiltrate  Treatment changed to every three-week Avastin with every other week Xeloda beginning 12/11/2015  Restaging CTs 04/12/2016-interval increase in size of adjacent right upper lobe pulmonary nodules and right lower lobe pulmonary nodule; unchanged 5 mm lesion right hepatic lobe; no additional visualized hepatic metastasis. Grossly unchanged wall thickening of the rectum.  Continuation of Xeloda every other week and every three-week AvastinXeloda placed on hold 05/06/2016 due to hand-foot syndrome  Xeloda resumed 05/28/2016 1500 mg every morning and 1000 mg every afternoon 7 days on/7 days off  CT 09/27/2016-enlargement of lung nodules and new liver lesions, stable rectal mass  Cycle 1 FOLFIRI/panitumumab 10/08/2016  Cycle 2 panitumumab 10/23/2016 (FOLFIRI held secondary to neutropenia)  Cycle 3 FOLFIRI/PANITUMUMAB 11/04/2016  Cycle 4 FOLFIRI/panitumumab 11/18/2016  Cycle 5 FOLFIRI/panitumumab 12/16/2016  Cycle 6 FOLFIRI/panitumumab 12/31/2016  CTs 01/16/2017-decreased size of pulmonary nodules and  liver lesions. Decreased perirectal lymph node  Cycle 7  FOLFIRI/PANITUMUMAB 01/28/2017  Cycle 8FOLFIRI/PANITUMUMAB 02/10/2017  Cycle 9 FOLFIRI/PANITUMUMAB 02/24/2017  Cycle 10 FOLFIRI/panitumumab 03/10/2017 (irinotecan held secondary to neutropenia)  Cycle 11 FOLFIRI/panitumumab 03/24/2017  Cycle12 FOLFIRI/Panitumumab 04/07/2017  Restaging CTs 04/25/2017-stable rectal mass with adjacent 8 mm left perirectal nodal metastasis. Prior hepatic metastases not visualized on the current study. Indeterminate 16 mm enhancing lesion present in segment 6, indeterminate. Minimal residual nodularity in the right upper lobe. No new/progressive pulmonary metastases.  Maintenance Xeloda/panitumumab 04/28/2017  Restaging CTs 08/18/2017-new and enlarging bilateral lung nodules; 2 hyperattenuating lesions in the liver appear similar to prior study; progression of wall thickening in the rectum with enlarging perirectal lymph nodes.  Initiation of radiation/Xeloda 09/01/2017, completed 09/22/2017  Cycle 1 FOLFIRI/Panitumumab 10/13/2017  Cycle 2 FOLFIRI 11/03/2017  Cycle 3 FOLFIRI 11/17/2017  Cycle 4 FOLFIRI 12/01/2017  Cycle 5 FOLFIRI 12/15/2017  CTs7/26/2019-mild progression lungs and liver; significantly improved rectal wall thickening; resolution of perirectal nodal adenopathy.  Cycle 6 FOLFIRI 12/29/2017  Cycle 7 FOLFIRI 01/12/2018  Cycle 8 FOLFIRI 01/26/2018  Cycle 9 FOLFIRI 02/09/2018  Cycle 10 FOLFIRI 02/23/2018  Restaging CTs 03/06/2018-majority of the pulmonary nodules are similar in size, at least 2 of the nodules are minimally increased in size. Similar appearing lesions within the liver. Similar-appearing mild wall thickening of the rectum.  Cycle 11FOLFIRI 03/09/2018  Cycle 12 FOLFIRI 03/23/2018  Cycle 13 FOLFIRI 04/06/2018  Cycle 14 FOLFIRI 04/20/2018  Cycle 15 FOLFIRI 05/04/2018  CT 05/15/2018-no change in pulmonary nodules, mild increase in size of 2 liver lesions, stable rectal wall thickening  On study at [facility], [place] with  palbociclib andUlixertinibbeginning 07/15/2018  [facility]  Restaging CTs 10/07/2018-enlargement of multiple lateral pulmonary nodules and liver lesions; redemonstrated circumferential thickening of the mid rectum with interval enlargement of an irregular nodule of the left perirectal fat.  Cycle 1 FOLFOX 10/12/2018  Cycle 2 FOLFOX 11/02/2018 (allergic reaction to oxaliplatin, oxaliplatin discontinued)  Guardant testing 11/30/2018-KRAS Q61H,EGFR amplification, MSI high not detected(Hunter:Plan for repeat guardant testing at a 49-monthinterval)  Cycle 1 Lonsurf 01/11/2019  Cycle 2 Lonsurf 02/08/2019, dose reduced  Every 2-week Avastin beginning 02/09/2019  Cycle 3 Lonsurf 03/08/2019  Cycle 4 Lonsurf 04/12/2019  CTs 05/07/2019-significant disease progression in the liver, minimal increase in pulmonary metastatic disease, increase in left perirectal nodularity, persistent wall thickening in the lower rectum  Repeat guardant 360 testing 06/03/2019-K-ras Q61H alteration, tumor mutation burden 64, MSI-high not detected  Cycle 1 FOLFIRI 06/14/2019  Liver biopsy 06/22/2019-metastatic adenocarcinoma  Cycle 2 FOLFIRI 07/05/2019 2.Multiple colon polyps on the colonoscopy 05/22/2015, tubular villous adenoma and a tubular adenoma were removed 3.Anemia-likely secondary to rectal bleeding.  4.Family history of multiple cancers including breast and prostate cancer 5.Transient right arm numbness 06/20/2015 6. Port-A-Cath placement 07/06/2015 by Dr. TMarcello Moores7. Mild neutropenia secondary to chemotherapy following cycle 1 FOLFOX. Neulasta added with cycle 2. 8. Delayed nausea following chemotherapy-emend added with cycle 3 FOLFOX;Emend added with cycle 12 FOLFIRI. 9. Early oxaliplatin neuropathy 10. 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 Xeloda resumed at a reduced dose. 11. Bilateral hand weakness. Question related to previous oxaliplatin. Referred to  neurology 06/17/2016. 12. Hypertension-amlodipine started 07/08/2016 13. Rash secondary to PANITUMUMAB.  14. Neutropenia following cycle 1 FOLFIRI/PANITUMUMAB; neutropenia following cycle 4 FOLFIRI/PANITUMUMAB. 15.Neutropenia secondary to Lonsurf-cycle 4 of Lonsurf delayed until 04/12/2019    Disposition: Mr. WWinrowappears stable.  He has completed 1 cycle of FOLFIRI.  Plan to proceed with cycle 2  today as scheduled.  We discussed the liver biopsy result from 06/22/2019.  He understands the biopsy shows metastatic cancer.  Foundation 1 result is pending.  We discussed the overall plan which is to consider immunotherapy if the tumor mutation burden returns high.  We reviewed the CBC and chemistry panel from today.  Labs are adequate to proceed as above.  He has mild hypotension, asymptomatic.  He will discontinue his blood pressure medication and increase fluid intake.  He will return for lab, follow-up, cycle 3 FOLFIRI in 2 weeks.  He will contact the office in the interim with any problems.  Patient seen with Dr. Benay Spice.  Ned Card ANP/GNP-BC   07/05/2019  11:43 AM This was a shared visit with Ned Card.  Hunter Hunter has completed 1 cycle of salvage therapy with FOLFIRI.  He will complete cycle 2 today.  We were waiting on results from foundation 1 testing biopsy.  The plan is to proceed with immunotherapy if the foundation 1 test confirms a high tumor burden.  Hunter Manson, MD

## 2019-07-05 NOTE — Patient Instructions (Signed)
[facility] Discharge Instructions for Patients Receiving Chemotherapy  Today you received the following chemotherapy agents: Irinotecan/Leucovorin/5FU  To help prevent nausea and vomiting after your treatment, we encourage you to take your nausea medication as directed.   If you develop nausea and vomiting that is not controlled by your nausea medication, call the clinic.   BELOW ARE SYMPTOMS THAT SHOULD BE REPORTED IMMEDIATELY:  *FEVER GREATER THAN 100.5 F  *CHILLS WITH OR WITHOUT FEVER  NAUSEA AND VOMITING THAT IS NOT CONTROLLED WITH YOUR NAUSEA MEDICATION  *UNUSUAL SHORTNESS OF BREATH  *UNUSUAL BRUISING OR BLEEDING  TENDERNESS IN MOUTH AND THROAT WITH OR WITHOUT PRESENCE OF ULCERS  *URINARY PROBLEMS  *BOWEL PROBLEMS  UNUSUAL RASH Items with * indicate a potential emergency and should be followed up as soon as possible.  Feel free to call the clinic should you have any questions or concerns. The clinic phone number is [phone].  Please show the CHEMO ALERT CARD at check-in to the Emergency Department and triage nurse.   

## 2019-07-05 NOTE — Patient Instructions (Signed)

## 2019-07-07 ENCOUNTER — Other Ambulatory Visit: Payer: Self-pay

## 2019-07-07 ENCOUNTER — Telehealth: Payer: Self-pay | Admitting: Oncology

## 2019-07-07 ENCOUNTER — Inpatient Hospital Stay: Payer: Medicare HMO

## 2019-07-07 VITALS — BP 101/66 | HR 86 | Temp 98.9°F | Resp 18

## 2019-07-07 DIAGNOSIS — R110 Nausea: Secondary | ICD-10-CM | POA: Diagnosis not present

## 2019-07-07 DIAGNOSIS — D701 Agranulocytosis secondary to cancer chemotherapy: Secondary | ICD-10-CM | POA: Diagnosis not present

## 2019-07-07 DIAGNOSIS — R160 Hepatomegaly, not elsewhere classified: Secondary | ICD-10-CM | POA: Diagnosis not present

## 2019-07-07 DIAGNOSIS — C20 Malignant neoplasm of rectum: Secondary | ICD-10-CM | POA: Diagnosis not present

## 2019-07-07 DIAGNOSIS — C787 Secondary malignant neoplasm of liver and intrahepatic bile duct: Secondary | ICD-10-CM | POA: Diagnosis not present

## 2019-07-07 DIAGNOSIS — Z5111 Encounter for antineoplastic chemotherapy: Secondary | ICD-10-CM | POA: Diagnosis not present

## 2019-07-07 DIAGNOSIS — D649 Anemia, unspecified: Secondary | ICD-10-CM | POA: Diagnosis not present

## 2019-07-07 DIAGNOSIS — R918 Other nonspecific abnormal finding of lung field: Secondary | ICD-10-CM | POA: Diagnosis not present

## 2019-07-07 DIAGNOSIS — Z5189 Encounter for other specified aftercare: Secondary | ICD-10-CM | POA: Diagnosis not present

## 2019-07-07 MED ORDER — PEGFILGRASTIM-JMDB 6 MG/0.6ML ~~LOC~~ SOSY
6.0000 mg | PREFILLED_SYRINGE | Freq: Once | SUBCUTANEOUS | Status: AC
  Administered 2019-07-07: 6 mg via SUBCUTANEOUS

## 2019-07-07 MED ORDER — SODIUM CHLORIDE 0.9% FLUSH
10.0000 mL | INTRAVENOUS | Status: DC | PRN
  Administered 2019-07-07: 10 mL
  Filled 2019-07-07: qty 10

## 2019-07-07 MED ORDER — PEGFILGRASTIM-JMDB 6 MG/0.6ML ~~LOC~~ SOSY
PREFILLED_SYRINGE | SUBCUTANEOUS | Status: AC
  Filled 2019-07-07: qty 0.6

## 2019-07-07 MED ORDER — HEPARIN SOD (PORK) LOCK FLUSH 100 UNIT/ML IV SOLN
500.0000 [IU] | Freq: Once | INTRAVENOUS | Status: AC | PRN
  Administered 2019-07-07: 500 [IU]
  Filled 2019-07-07: qty 5

## 2019-07-07 NOTE — Telephone Encounter (Signed)
Scheduled per los. Called and spoke with patient. Confirmed appt 

## 2019-07-07 NOTE — Patient Instructions (Signed)

## 2019-07-08 ENCOUNTER — Encounter (HOSPITAL_COMMUNITY): Payer: Self-pay | Admitting: Oncology

## 2019-07-18 ENCOUNTER — Other Ambulatory Visit: Payer: Self-pay | Admitting: Oncology

## 2019-07-19 ENCOUNTER — Inpatient Hospital Stay: Payer: Medicare HMO

## 2019-07-19 ENCOUNTER — Other Ambulatory Visit: Payer: Self-pay

## 2019-07-19 ENCOUNTER — Inpatient Hospital Stay: Payer: Medicare HMO | Admitting: Nutrition

## 2019-07-19 ENCOUNTER — Inpatient Hospital Stay (HOSPITAL_BASED_OUTPATIENT_CLINIC_OR_DEPARTMENT_OTHER): Payer: Medicare HMO | Admitting: Oncology

## 2019-07-19 VITALS — BP 124/84 | HR 90 | Temp 98.3°F | Resp 17 | Ht 73.0 in | Wt 169.8 lb

## 2019-07-19 DIAGNOSIS — Z5111 Encounter for antineoplastic chemotherapy: Secondary | ICD-10-CM | POA: Diagnosis not present

## 2019-07-19 DIAGNOSIS — C20 Malignant neoplasm of rectum: Secondary | ICD-10-CM

## 2019-07-19 DIAGNOSIS — Z95828 Presence of other vascular implants and grafts: Secondary | ICD-10-CM

## 2019-07-19 DIAGNOSIS — R160 Hepatomegaly, not elsewhere classified: Secondary | ICD-10-CM | POA: Diagnosis not present

## 2019-07-19 DIAGNOSIS — D701 Agranulocytosis secondary to cancer chemotherapy: Secondary | ICD-10-CM | POA: Diagnosis not present

## 2019-07-19 DIAGNOSIS — R110 Nausea: Secondary | ICD-10-CM | POA: Diagnosis not present

## 2019-07-19 DIAGNOSIS — R918 Other nonspecific abnormal finding of lung field: Secondary | ICD-10-CM | POA: Diagnosis not present

## 2019-07-19 DIAGNOSIS — D649 Anemia, unspecified: Secondary | ICD-10-CM | POA: Diagnosis not present

## 2019-07-19 DIAGNOSIS — C787 Secondary malignant neoplasm of liver and intrahepatic bile duct: Secondary | ICD-10-CM | POA: Diagnosis not present

## 2019-07-19 DIAGNOSIS — Z5189 Encounter for other specified aftercare: Secondary | ICD-10-CM | POA: Diagnosis not present

## 2019-07-19 LAB — CBC WITH DIFFERENTIAL (CANCER CENTER ONLY)
Abs Immature Granulocytes: 0.14 10*3/uL — ABNORMAL HIGH (ref 0.00–0.07)
Basophils Absolute: 0.1 10*3/uL (ref 0.0–0.1)
Basophils Relative: 0 %
Eosinophils Absolute: 0.2 10*3/uL (ref 0.0–0.5)
Eosinophils Relative: 1 %
HCT: 34.2 % — ABNORMAL LOW (ref 39.0–52.0)
Hemoglobin: 10.9 g/dL — ABNORMAL LOW (ref 13.0–17.0)
Immature Granulocytes: 1 %
Lymphocytes Relative: 12 %
Lymphs Abs: 1.6 10*3/uL (ref 0.7–4.0)
MCH: 32 pg (ref 26.0–34.0)
MCHC: 31.9 g/dL (ref 30.0–36.0)
MCV: 100.3 fL — ABNORMAL HIGH (ref 80.0–100.0)
Monocytes Absolute: 1 10*3/uL (ref 0.1–1.0)
Monocytes Relative: 7 %
Neutro Abs: 11 10*3/uL — ABNORMAL HIGH (ref 1.7–7.7)
Neutrophils Relative %: 79 %
Platelet Count: 274 10*3/uL (ref 150–400)
RBC: 3.41 MIL/uL — ABNORMAL LOW (ref 4.22–5.81)
RDW: 16.4 % — ABNORMAL HIGH (ref 11.5–15.5)
WBC Count: 14 10*3/uL — ABNORMAL HIGH (ref 4.0–10.5)
nRBC: 0 % (ref 0.0–0.2)

## 2019-07-19 LAB — CMP (CANCER CENTER ONLY)
ALT: 12 U/L (ref 0–44)
AST: 19 U/L (ref 15–41)
Albumin: 3.3 g/dL — ABNORMAL LOW (ref 3.5–5.0)
Alkaline Phosphatase: 139 U/L — ABNORMAL HIGH (ref 38–126)
Anion gap: 8 (ref 5–15)
BUN: 9 mg/dL (ref 8–23)
CO2: 26 mmol/L (ref 22–32)
Calcium: 9 mg/dL (ref 8.9–10.3)
Chloride: 106 mmol/L (ref 98–111)
Creatinine: 0.99 mg/dL (ref 0.61–1.24)
GFR, Est AFR Am: >60 mL/min (ref >60)
GFR, Estimated: >60 mL/min (ref >60)
Glucose, Bld: 107 mg/dL — ABNORMAL HIGH (ref 70–99)
Potassium: 3.9 mmol/L (ref 3.5–5.1)
Sodium: 140 mmol/L (ref 135–145)
Total Bilirubin: 0.3 mg/dL (ref 0.3–1.2)
Total Protein: 7.9 g/dL (ref 6.5–8.1)

## 2019-07-19 LAB — CEA (IN HOUSE-CHCC): CEA (CHCC-In House): 202.35 ng/mL — ABNORMAL HIGH (ref 0.00–5.00)

## 2019-07-19 MED ORDER — DEXAMETHASONE SODIUM PHOSPHATE 10 MG/ML IJ SOLN
10.0000 mg | Freq: Once | INTRAMUSCULAR | Status: AC
  Administered 2019-07-19: 10 mg via INTRAVENOUS

## 2019-07-19 MED ORDER — SODIUM CHLORIDE 0.9 % IV SOLN
Freq: Once | INTRAVENOUS | Status: AC
  Filled 2019-07-19: qty 250

## 2019-07-19 MED ORDER — ATROPINE SULFATE 1 MG/ML IJ SOLN
0.5000 mg | Freq: Once | INTRAMUSCULAR | Status: AC | PRN
  Administered 2019-07-19: 0.5 mg via INTRAVENOUS

## 2019-07-19 MED ORDER — SODIUM CHLORIDE 0.9 % IV SOLN
125.0000 mg/m2 | Freq: Once | INTRAVENOUS | Status: AC
  Administered 2019-07-19: 260 mg via INTRAVENOUS
  Filled 2019-07-19: qty 13

## 2019-07-19 MED ORDER — DEXAMETHASONE SODIUM PHOSPHATE 10 MG/ML IJ SOLN
INTRAMUSCULAR | Status: AC
  Filled 2019-07-19: qty 1

## 2019-07-19 MED ORDER — PALONOSETRON HCL INJECTION 0.25 MG/5ML
INTRAVENOUS | Status: AC
  Filled 2019-07-19: qty 5

## 2019-07-19 MED ORDER — SODIUM CHLORIDE 0.9% FLUSH
10.0000 mL | Freq: Once | INTRAVENOUS | Status: AC
  Administered 2019-07-19: 10 mL
  Filled 2019-07-19: qty 10

## 2019-07-19 MED ORDER — PALONOSETRON HCL INJECTION 0.25 MG/5ML
0.2500 mg | Freq: Once | INTRAVENOUS | Status: AC
  Administered 2019-07-19: 0.25 mg via INTRAVENOUS

## 2019-07-19 MED ORDER — ATROPINE SULFATE 1 MG/ML IJ SOLN
INTRAMUSCULAR | Status: AC
  Filled 2019-07-19: qty 1

## 2019-07-19 MED ORDER — FLUOROURACIL CHEMO INJECTION 2.5 GM/50ML
300.0000 mg/m2 | Freq: Once | INTRAVENOUS | Status: AC
  Administered 2019-07-19: 600 mg via INTRAVENOUS
  Filled 2019-07-19: qty 12

## 2019-07-19 MED ORDER — SODIUM CHLORIDE 0.9 % IV SOLN
300.0000 mg/m2 | Freq: Once | INTRAVENOUS | Status: AC
  Administered 2019-07-19: 606 mg via INTRAVENOUS
  Filled 2019-07-19: qty 30.3

## 2019-07-19 MED ORDER — SODIUM CHLORIDE 0.9 % IV SOLN
2475.0000 mg/m2 | INTRAVENOUS | Status: DC
  Administered 2019-07-19: 5000 mg via INTRAVENOUS
  Filled 2019-07-19: qty 100

## 2019-07-19 NOTE — Progress Notes (Signed)
Brief nutrition follow-up completed with patient during infusion for rectal cancer. Patient reports that he had decreased appetite after biopsy. He reports he is eating much better. He has occasional nausea and vomiting. His weight is stable over the last 2 weeks and was documented as 169.8 pounds today February 15. He denies questions or concerns at this time.  Educated patient to continue strategies for increased calories and protein to minimize weight loss. Educated to add oral nutrition supplements if oral intake of foods decreases. Patient has my contact information for questions or concerns.  **Disclaimer: This note was dictated with voice recognition software. Similar sounding words can inadvertently be transcribed and this note may contain transcription errors which may not have been corrected upon publication of note.**

## 2019-07-19 NOTE — Patient Instructions (Signed)
[facility] Discharge Instructions for Patients Receiving Chemotherapy  Today you received the following chemotherapy agents :  Irinotecan, Leucovorin, Fluorouracil.  To help prevent nausea and vomiting after your treatment, we encourage you to take your nausea medication as prescribed.   If you develop nausea and vomiting that is not controlled by your nausea medication, call the clinic.   BELOW ARE SYMPTOMS THAT SHOULD BE REPORTED IMMEDIATELY:  *FEVER GREATER THAN 100.5 F  *CHILLS WITH OR WITHOUT FEVER  NAUSEA AND VOMITING THAT IS NOT CONTROLLED WITH YOUR NAUSEA MEDICATION  *UNUSUAL SHORTNESS OF BREATH  *UNUSUAL BRUISING OR BLEEDING  TENDERNESS IN MOUTH AND THROAT WITH OR WITHOUT PRESENCE OF ULCERS  *URINARY PROBLEMS  *BOWEL PROBLEMS  UNUSUAL RASH Items with * indicate a potential emergency and should be followed up as soon as possible.  Feel free to call the clinic should you have any questions or concerns. The clinic phone number is [phone].  Please show the CHEMO ALERT CARD at check-in to the Emergency Department and triage nurse.   

## 2019-07-19 NOTE — Progress Notes (Signed)
[facility] OFFICE PROGRESS NOTE   Diagnosis: Rectal cancer  INTERVAL HISTORY:   Mr. Stracener completed another cycle of FOLFIRI on 07/05/2019.  He reports intermittent dysphagia.  He has rectal pain only with bowel movements.  No significant diarrhea.  He has malaise over the week following chemotherapy.  Objective:  Vital signs in last 24 hours:  Blood pressure 124/84, pulse 90, temperature 98.3 F (36.8 C), temperature source Temporal, resp. rate 17, height '6\' 1"'  (1.854 m), weight 169 lb 12.8 oz (77 kg), SpO2 100 %.    HEENT: No thrush or ulcers  GI: Nontender, no mass, no hepatosplenomegaly Vascular: No leg edema  Skin: Palms without erythema  Portacath/PICC-without erythema  Lab Results:  Lab Results  Component Value Date   WBC 14.0 (H) 07/19/2019   HGB 10.9 (L) 07/19/2019   HCT 34.2 (L) 07/19/2019   MCV 100.3 (H) 07/19/2019   PLT 274 07/19/2019   NEUTROABS 11.0 (H) 07/19/2019    CMP  Lab Results  Component Value Date   NA 140 07/19/2019   K 3.9 07/19/2019   CL 106 07/19/2019   CO2 26 07/19/2019   GLUCOSE 107 (H) 07/19/2019   BUN 9 07/19/2019   CREATININE 0.99 07/19/2019   CALCIUM 9.0 07/19/2019   PROT 7.9 07/19/2019   ALBUMIN 3.3 (L) 07/19/2019   AST 19 07/19/2019   ALT 12 07/19/2019   ALKPHOS 139 (H) 07/19/2019   BILITOT 0.3 07/19/2019   GFRNONAA >60 07/19/2019   GFRAA >60 07/19/2019    Lab Results  Component Value Date   CEA1 202.35 (H) 07/19/2019     Medications: I have reviewed the patient's current medications.   Assessment/Plan:  1.Rectal cancer, invasive adenocarcinoma confirmed at colonoscopy 05/22/2015, EUS staging 06/01/2015 revealed a uT3,N1 lesion at 7 cm from the anal verge  Staging CT abdomen/pelvis 05/15/2015 confirmed a mass at the rectosigmoid colon, a suspicious perirectal lymph node, and a 6 mm right liver lesion felt to most likely be a cyst  CT chest 05/30/2015 with indeterminate pulmonary nodules and  multiple liver lesions suspicious for metastases  Ultrasound 06/13/2014 with no liver lesions identified  MRI abdomen 06/21/2015 consistent with multiple liver metastases  CT biopsy of liver lesion 06/26/2015. Pathology showed metastatic adenocarcinoma consistent with colonic primary.MSI-stable, low mutation burden, no RAS or BRAFmutation  Cycle 1 FOLFOX 07/10/2015  Cycle 2 FOLFOX plus Avastin 07/24/2015 with Neulasta support  Cycle 3 FOLFOX plus Avastin 08/07/2015  Cycle 4 FOLFOX plus Avastin 08/21/2015  cycle 5 FOLFOX plus Avastin 09/04/2015  Restaging CTs 09/15/2015 revealed a decrease in the size of liver lesions, decreased rectal primary, stable/decreased size of tiny lung nodules  Cycle 6 FOLFOX plus Avastin 09/18/2015  Cycle 7 FOLFOX plus Avastin 10/02/2015  Cycle 8 FOLFOX plus Avastin 10/16/2015  Cycle 9 FOLFOX plus Avastin 11/06/2015  Cycle 10 FOLFOX plus Avastin 11/20/2015  CTs 12/07/2015-mild decrease in wall thickening at the rectum, decrease in tiny hepatic metastases, stable lung nodules, no evidence of progressive metastatic disease, new left lower lobe infiltrate  Treatment changed to every three-week Avastin with every other week Xeloda beginning 12/11/2015  Restaging CTs 04/12/2016-interval increase in size of adjacent right upper lobe pulmonary nodules and right lower lobe pulmonary nodule; unchanged 5 mm lesion right hepatic lobe; no additional visualized hepatic metastasis. Grossly unchanged wall thickening of the rectum.  Continuation of Xeloda every other week and every three-week AvastinXeloda placed on hold 05/06/2016 due to hand-foot syndrome  Xeloda resumed 05/28/2016 1500 mg every  morning and 1000 mg every afternoon 7 days on/7 days off  CT 09/27/2016-enlargement of lung nodules and new liver lesions, stable rectal mass  Cycle 1 FOLFIRI/panitumumab 10/08/2016  Cycle 2 panitumumab 10/23/2016 (FOLFIRI held secondary to neutropenia)  Cycle 3  FOLFIRI/PANITUMUMAB 11/04/2016  Cycle 4 FOLFIRI/panitumumab 11/18/2016  Cycle 5 FOLFIRI/panitumumab 12/16/2016  Cycle 6 FOLFIRI/panitumumab 12/31/2016  CTs 01/16/2017-decreased size of pulmonary nodules and liver lesions. Decreased perirectal lymph node  Cycle 7 FOLFIRI/PANITUMUMAB 01/28/2017  Cycle 8FOLFIRI/PANITUMUMAB 02/10/2017  Cycle 9 FOLFIRI/PANITUMUMAB 02/24/2017  Cycle 10 FOLFIRI/panitumumab 03/10/2017 (irinotecan held secondary to neutropenia)  Cycle 11 FOLFIRI/panitumumab 03/24/2017  Cycle12 FOLFIRI/Panitumumab 04/07/2017  Restaging CTs 04/25/2017-stable rectal mass with adjacent 8 mm left perirectal nodal metastasis. Prior hepatic metastases not visualized on the current study. Indeterminate 16 mm enhancing lesion present in segment 6, indeterminate. Minimal residual nodularity in the right upper lobe. No new/progressive pulmonary metastases.  Maintenance Xeloda/panitumumab 04/28/2017  Restaging CTs 08/18/2017-new and enlarging bilateral lung nodules; 2 hyperattenuating lesions in the liver appear similar to prior study; progression of wall thickening in the rectum with enlarging perirectal lymph nodes.  Initiation of radiation/Xeloda 09/01/2017, completed 09/22/2017  Cycle 1 FOLFIRI/Panitumumab 10/13/2017  Cycle 2 FOLFIRI 11/03/2017  Cycle 3 FOLFIRI 11/17/2017  Cycle 4 FOLFIRI 12/01/2017  Cycle 5 FOLFIRI 12/15/2017  CTs7/26/2019-mild progression lungs and liver; significantly improved rectal wall thickening; resolution of perirectal nodal adenopathy.  Cycle 6 FOLFIRI 12/29/2017  Cycle 7 FOLFIRI 01/12/2018  Cycle 8 FOLFIRI 01/26/2018  Cycle 9 FOLFIRI 02/09/2018  Cycle 10 FOLFIRI 02/23/2018  Restaging CTs 03/06/2018-majority of the pulmonary nodules are similar in size, at least 2 of the nodules are minimally increased in size. Similar appearing lesions within the liver. Similar-appearing mild wall thickening of the rectum.  Cycle 11FOLFIRI 03/09/2018  Cycle  12 FOLFIRI 03/23/2018  Cycle 13 FOLFIRI 04/06/2018  Cycle 14 FOLFIRI 04/20/2018  Cycle 15 FOLFIRI 05/04/2018  CT 05/15/2018-no change in pulmonary nodules, mild increase in size of 2 liver lesions, stable rectal wall thickening  On study at [facility], [place] with palbociclib andUlixertinibbeginning 07/15/2018  [facility]  Restaging CTs 10/07/2018-enlargement of multiple lateral pulmonary nodules and liver lesions; redemonstrated circumferential thickening of the mid rectum with interval enlargement of an irregular nodule of the left perirectal fat.  Cycle 1 FOLFOX 10/12/2018  Cycle 2 FOLFOX 11/02/2018 (allergic reaction to oxaliplatin, oxaliplatin discontinued)  Guardant testing 11/30/2018-KRAS Q61H,EGFR amplification, MSI high not detected(NOTE:Plan for repeat guardant testing at a 63-monthinterval)  Cycle 1 Lonsurf 01/11/2019  Cycle 2 Lonsurf 02/08/2019, dose reduced  Every 2-week Avastin beginning 02/09/2019  Cycle 3 Lonsurf 03/08/2019  Cycle 4 Lonsurf 04/12/2019  CTs 05/07/2019-significant disease progression in the liver, minimal increase in pulmonary metastatic disease, increase in left perirectal nodularity, persistent wall thickening in the lower rectum  Repeat guardant 360 testing 06/03/2019-K-ras Q61H alteration, tumor mutation burden 64, MSI-high not detected  Cycle 1 FOLFIRI 06/14/2019  Liver biopsy 06/22/2019-metastatic adenocarcinoma, Foundation 1- KRASQ61 TMB 13, MSS EGFR amplification  Cycle 2 FOLFIRI 07/05/2019  Cycle 3 FOLFIRI 07/19/2019 2.Multiple colon polyps on the colonoscopy 05/22/2015, tubular villous adenoma and a tubular adenoma were removed 3.Anemia-likely secondary to rectal bleeding.  4.Family history of multiple cancers including breast and prostate cancer 5.Transient right arm numbness 06/20/2015 6. Port-A-Cath placement 07/06/2015 by Dr. TMarcello Moores7. Mild neutropenia secondary to chemotherapy following cycle 1 FOLFOX. Neulasta added  with cycle 2. 8. Delayed nausea following chemotherapy-emend added with cycle 3 FOLFOX;Emend added with cycle 12 FOLFIRI. 9. Early oxaliplatin neuropathy 10.  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 Xeloda resumed at a reduced dose. 11. Bilateral hand weakness. Question related to previous oxaliplatin. Referred to neurology 06/17/2016. 12. Hypertension-amlodipine started 07/08/2016 13. Rash secondary to PANITUMUMAB.  14. Neutropenia following cycle 1 FOLFIRI/PANITUMUMAB; neutropenia following cycle 4 FOLFIRI/PANITUMUMAB. 15.Neutropenia secondary to Lonsurf-cycle 4 of Lonsurf delayed until 04/12/2019   Disposition: Mr. Guajardo appears stable.  He completed 2 cycles of salvage therapy with FOLFIRI.  We will follow up on the CEA from today.  The Foundation 1 testing confirmed a mildly elevated tumor mutation burden at 13.  The plan is to treat with single agent pembrolizumab if he progresses on FOLFIRI.  He will be scheduled for a restaging CT evaluation after  Mr. Bunton will return for an office visit and chemotherapy in 2 weeks.   Betsy Coder, MD  07/19/2019  12:55 PM

## 2019-07-21 ENCOUNTER — Telehealth: Payer: Self-pay | Admitting: Oncology

## 2019-07-21 ENCOUNTER — Other Ambulatory Visit: Payer: Self-pay

## 2019-07-21 ENCOUNTER — Inpatient Hospital Stay: Payer: Medicare HMO

## 2019-07-21 VITALS — BP 107/75 | HR 88 | Temp 99.0°F | Resp 18

## 2019-07-21 DIAGNOSIS — Z95828 Presence of other vascular implants and grafts: Secondary | ICD-10-CM

## 2019-07-21 DIAGNOSIS — C787 Secondary malignant neoplasm of liver and intrahepatic bile duct: Secondary | ICD-10-CM | POA: Diagnosis not present

## 2019-07-21 DIAGNOSIS — D649 Anemia, unspecified: Secondary | ICD-10-CM | POA: Diagnosis not present

## 2019-07-21 DIAGNOSIS — C20 Malignant neoplasm of rectum: Secondary | ICD-10-CM | POA: Diagnosis not present

## 2019-07-21 DIAGNOSIS — Z5111 Encounter for antineoplastic chemotherapy: Secondary | ICD-10-CM | POA: Diagnosis not present

## 2019-07-21 DIAGNOSIS — Z5189 Encounter for other specified aftercare: Secondary | ICD-10-CM | POA: Diagnosis not present

## 2019-07-21 DIAGNOSIS — R110 Nausea: Secondary | ICD-10-CM | POA: Diagnosis not present

## 2019-07-21 DIAGNOSIS — D701 Agranulocytosis secondary to cancer chemotherapy: Secondary | ICD-10-CM | POA: Diagnosis not present

## 2019-07-21 DIAGNOSIS — R918 Other nonspecific abnormal finding of lung field: Secondary | ICD-10-CM | POA: Diagnosis not present

## 2019-07-21 DIAGNOSIS — R160 Hepatomegaly, not elsewhere classified: Secondary | ICD-10-CM | POA: Diagnosis not present

## 2019-07-21 MED ORDER — PEGFILGRASTIM-JMDB 6 MG/0.6ML ~~LOC~~ SOSY
PREFILLED_SYRINGE | SUBCUTANEOUS | Status: AC
  Filled 2019-07-21: qty 0.6

## 2019-07-21 MED ORDER — PEGFILGRASTIM-JMDB 6 MG/0.6ML ~~LOC~~ SOSY
6.0000 mg | PREFILLED_SYRINGE | Freq: Once | SUBCUTANEOUS | Status: AC
  Administered 2019-07-21: 6 mg via SUBCUTANEOUS

## 2019-07-21 MED ORDER — SODIUM CHLORIDE 0.9% FLUSH
10.0000 mL | Freq: Once | INTRAVENOUS | Status: AC
  Administered 2019-07-21: 10 mL
  Filled 2019-07-21: qty 10

## 2019-07-21 MED ORDER — HEPARIN SOD (PORK) LOCK FLUSH 100 UNIT/ML IV SOLN
500.0000 [IU] | Freq: Once | INTRAVENOUS | Status: AC
  Administered 2019-07-21: 500 [IU]
  Filled 2019-07-21: qty 5

## 2019-07-21 NOTE — Telephone Encounter (Signed)
Scheduled per los. Called, not able to leave msg. Mailed printout  

## 2019-07-21 NOTE — Patient Instructions (Signed)
Central Line, Adult A central line is a thin, flexible tube (catheter) that is put in your vein. It can be used to:  Give you medicine.  Give you food and nutrients. Follow these instructions at home: Caring for the tube   Follow instructions from your doctor about: ? Flushing the tube with saline solution. ? Cleaning the tube and the area around it.  Only flush with clean (sterile) supplies. The supplies should be from your doctor, a pharmacy, or another place that your doctor recommends.  Before you flush the tube or clean the area around the tube: ? Wash your hands with soap and water. If you cannot use soap and water, use hand sanitizer. ? Clean the central line hub with rubbing alcohol. Caring for your skin  Keep the area where the tube was put in clean and dry.  Every day, and when changing the bandage, check the skin around the central line for: ? Redness, swelling, or pain. ? Fluid or blood. ? Warmth. ? Pus. ? A bad smell. General instructions  Keep the tube clamped, unless it is being used.  Keep your supplies in a clean, dry location.  If you or someone else accidentally pulls on the tube, make sure: ? The bandage (dressing) is okay. ? There is no bleeding. ? The tube has not been pulled out.  Do not use scissors or sharp objects near the tube.  Do not swim or let the tube soak in a tub.  Ask your doctor what activities are safe for you. Your doctor may tell you not to lift anything or move your arm too much.  Take over-the-counter and prescription medicines only as told by your doctor.  Change bandages as told by your doctor.  Keep your bandage dry. If a bandage gets wet, have it changed right away.  Keep all follow-up visits as told by your doctor. This is important. Throwing away supplies  Throw away any syringes in a trash (disposal) container that is only for sharp items (sharps container). You can buy a sharps container from a pharmacy, or you  can make one by using an empty hard plastic bottle with a cover.  Place any used bandages or infusion bags into a plastic bag. Throw that bag in the trash. Contact a doctor if:  You have any of these where the tube was put in: ? Redness, swelling, or pain. ? Fluid or blood. ? A warm feeling. ? Pus or a bad smell. Get help right away if:  You have: ? A fever. ? Chills. ? Trouble getting enough air (shortness of breath). ? Trouble breathing. ? Pain in your chest. ? Swelling in your neck, face, chest, or arm.  You are coughing.  You feel your heart beating fast or skipping beats.  You feel dizzy or you pass out (faint).  There are red lines coming from where the tube was put in.  The area where the tube was put in is bleeding and the bleeding will not stop.  Your tube is hard to flush.  You do not get a blood return from the tube.  The tube gets loose or comes out.  The tube has a hole or a tear.  The tube leaks. Summary  A central line is a thin, flexible tube (catheter) that is put in your vein. It can be used to take blood for lab tests or to give you medicine.  Follow instructions from your doctor about flushing and cleaning the   tube.  Keep the area where the tube was put in clean and dry.  Ask your doctor what activities are safe for you. This information is not intended to replace advice given to you by your health care provider. Make sure you discuss any questions you have with your health care provider. Document Revised: 09/09/2018 Document Reviewed: 06/06/2016 Elsevier Patient Education  2020 Elsevier Inc. Pegfilgrastim injection What is this medicine? PEGFILGRASTIM (PEG fil gra stim) is a long-acting granulocyte colony-stimulating factor that stimulates the growth of neutrophils, a type of white blood cell important in the body's fight against infection. It is used to reduce the incidence of fever and infection in patients with certain types of cancer who  are receiving chemotherapy that affects the bone marrow, and to increase survival after being exposed to high doses of radiation. This medicine may be used for other purposes; ask your health care provider or pharmacist if you have questions. COMMON BRAND NAME(S): Fulphila, Neulasta, UDENYCA, Ziextenzo What should I tell my health care provider before I take this medicine? They need to know if you have any of these conditions:  kidney disease  latex allergy  ongoing radiation therapy  sickle cell disease  skin reactions to acrylic adhesives (On-Body Injector only)  an unusual or allergic reaction to pegfilgrastim, filgrastim, other medicines, foods, dyes, or preservatives  pregnant or trying to get pregnant  breast-feeding How should I use this medicine? This medicine is for injection under the skin. If you get this medicine at home, you will be taught how to prepare and give the pre-filled syringe or how to use the On-body Injector. Refer to the patient Instructions for Use for detailed instructions. Use exactly as directed. Tell your healthcare provider immediately if you suspect that the On-body Injector may not have performed as intended or if you suspect the use of the On-body Injector resulted in a missed or partial dose. It is important that you put your used needles and syringes in a special sharps container. Do not put them in a trash can. If you do not have a sharps container, call your pharmacist or healthcare provider to get one. Talk to your pediatrician regarding the use of this medicine in children. While this drug may be prescribed for selected conditions, precautions do apply. Overdosage: If you think you have taken too much of this medicine contact a poison control center or emergency room at once. NOTE: This medicine is only for you. Do not share this medicine with others. What if I miss a dose? It is important not to miss your dose. Call your doctor or health care  professional if you miss your dose. If you miss a dose due to an On-body Injector failure or leakage, a new dose should be administered as soon as possible using a single prefilled syringe for manual use. What may interact with this medicine? Interactions have not been studied. Give your health care provider a list of all the medicines, herbs, non-prescription drugs, or dietary supplements you use. Also tell them if you smoke, drink alcohol, or use illegal drugs. Some items may interact with your medicine. This list may not describe all possible interactions. Give your health care provider a list of all the medicines, herbs, non-prescription drugs, or dietary supplements you use. Also tell them if you smoke, drink alcohol, or use illegal drugs. Some items may interact with your medicine. What should I watch for while using this medicine? You may need blood work done while you are   taking this medicine. If you are going to need a MRI, CT scan, or other procedure, tell your doctor that you are using this medicine (On-Body Injector only). What side effects may I notice from receiving this medicine? Side effects that you should report to your doctor or health care professional as soon as possible:  allergic reactions like skin rash, itching or hives, swelling of the face, lips, or tongue  back pain  dizziness  fever  pain, redness, or irritation at site where injected  pinpoint red spots on the skin  red or dark-brown urine  shortness of breath or breathing problems  stomach or side pain, or pain at the shoulder  swelling  tiredness  trouble passing urine or change in the amount of urine Side effects that usually do not require medical attention (report to your doctor or health care professional if they continue or are bothersome):  bone pain  muscle pain This list may not describe all possible side effects. Call your doctor for medical advice about side effects. You may report side  effects to FDA at 1-800-FDA-1088. Where should I keep my medicine? Keep out of the reach of children. If you are using this medicine at home, you will be instructed on how to store it. Throw away any unused medicine after the expiration date on the label. NOTE: This sheet is a summary. It may not cover all possible information. If you have questions about this medicine, talk to your doctor, pharmacist, or health care provider.  2020 Elsevier/Gold Standard (2017-08-25 16:57:08)  

## 2019-07-24 ENCOUNTER — Ambulatory Visit: Payer: Medicare HMO | Attending: Internal Medicine

## 2019-07-24 DIAGNOSIS — Z23 Encounter for immunization: Secondary | ICD-10-CM | POA: Insufficient documentation

## 2019-07-24 NOTE — Progress Notes (Signed)
   Covid-19 Vaccination Clinic  Name:  KHAIL ZAMAN    MRN: [id number] DOB: [birth year]  07/24/2019  Mr. Dory was observed post Covid-19 immunization for 15 minutes without incidence. He was provided with Vaccine Information Sheet and instruction to access the V-Safe system.   Mr. Dieleman was instructed to call 911 with any severe reactions post vaccine: Marland Kitchen Difficulty breathing  . Swelling of your face and throat  . A fast heartbeat  . A bad rash all over your body  . Dizziness and weakness    Immunizations Administered    Name Date Dose VIS Date Route   Pfizer COVID-19 Vaccine 07/24/2019  9:40 AM 0.3 mL 05/14/2019 Intramuscular   Manufacturer: [place]   Lot: [id number]   [place]: [id number]

## 2019-07-31 ENCOUNTER — Other Ambulatory Visit: Payer: Self-pay | Admitting: Oncology

## 2019-08-02 ENCOUNTER — Inpatient Hospital Stay: Payer: Medicare HMO

## 2019-08-02 ENCOUNTER — Inpatient Hospital Stay: Payer: Medicare HMO | Attending: Nurse Practitioner | Admitting: Nurse Practitioner

## 2019-08-02 ENCOUNTER — Encounter: Payer: Self-pay | Admitting: Nurse Practitioner

## 2019-08-02 ENCOUNTER — Other Ambulatory Visit: Payer: Self-pay

## 2019-08-02 VITALS — BP 118/76 | HR 84 | Temp 98.5°F | Resp 17 | Ht 73.0 in | Wt 169.3 lb

## 2019-08-02 DIAGNOSIS — R918 Other nonspecific abnormal finding of lung field: Secondary | ICD-10-CM | POA: Insufficient documentation

## 2019-08-02 DIAGNOSIS — R531 Weakness: Secondary | ICD-10-CM | POA: Insufficient documentation

## 2019-08-02 DIAGNOSIS — C787 Secondary malignant neoplasm of liver and intrahepatic bile duct: Secondary | ICD-10-CM | POA: Diagnosis not present

## 2019-08-02 DIAGNOSIS — T451X5A Adverse effect of antineoplastic and immunosuppressive drugs, initial encounter: Secondary | ICD-10-CM | POA: Insufficient documentation

## 2019-08-02 DIAGNOSIS — C20 Malignant neoplasm of rectum: Secondary | ICD-10-CM

## 2019-08-02 DIAGNOSIS — L271 Localized skin eruption due to drugs and medicaments taken internally: Secondary | ICD-10-CM | POA: Insufficient documentation

## 2019-08-02 DIAGNOSIS — D709 Neutropenia, unspecified: Secondary | ICD-10-CM | POA: Insufficient documentation

## 2019-08-02 DIAGNOSIS — R110 Nausea: Secondary | ICD-10-CM | POA: Insufficient documentation

## 2019-08-02 DIAGNOSIS — K649 Unspecified hemorrhoids: Secondary | ICD-10-CM | POA: Insufficient documentation

## 2019-08-02 DIAGNOSIS — D701 Agranulocytosis secondary to cancer chemotherapy: Secondary | ICD-10-CM | POA: Insufficient documentation

## 2019-08-02 DIAGNOSIS — Z5189 Encounter for other specified aftercare: Secondary | ICD-10-CM | POA: Diagnosis not present

## 2019-08-02 DIAGNOSIS — Z803 Family history of malignant neoplasm of breast: Secondary | ICD-10-CM | POA: Diagnosis not present

## 2019-08-02 DIAGNOSIS — I10 Essential (primary) hypertension: Secondary | ICD-10-CM | POA: Diagnosis not present

## 2019-08-02 DIAGNOSIS — R197 Diarrhea, unspecified: Secondary | ICD-10-CM | POA: Insufficient documentation

## 2019-08-02 DIAGNOSIS — G620 Drug-induced polyneuropathy: Secondary | ICD-10-CM | POA: Insufficient documentation

## 2019-08-02 DIAGNOSIS — R21 Rash and other nonspecific skin eruption: Secondary | ICD-10-CM | POA: Diagnosis not present

## 2019-08-02 DIAGNOSIS — Z86010 Personal history of colonic polyps: Secondary | ICD-10-CM | POA: Diagnosis not present

## 2019-08-02 DIAGNOSIS — Z5111 Encounter for antineoplastic chemotherapy: Secondary | ICD-10-CM | POA: Insufficient documentation

## 2019-08-02 DIAGNOSIS — R109 Unspecified abdominal pain: Secondary | ICD-10-CM | POA: Diagnosis not present

## 2019-08-02 DIAGNOSIS — D649 Anemia, unspecified: Secondary | ICD-10-CM | POA: Diagnosis not present

## 2019-08-02 DIAGNOSIS — Z79899 Other long term (current) drug therapy: Secondary | ICD-10-CM | POA: Insufficient documentation

## 2019-08-02 DIAGNOSIS — Z8042 Family history of malignant neoplasm of prostate: Secondary | ICD-10-CM | POA: Diagnosis not present

## 2019-08-02 LAB — CBC WITH DIFFERENTIAL (CANCER CENTER ONLY)
Abs Immature Granulocytes: 0.13 10*3/uL — ABNORMAL HIGH (ref 0.00–0.07)
Basophils Absolute: 0 10*3/uL (ref 0.0–0.1)
Basophils Relative: 0 %
Eosinophils Absolute: 0.1 10*3/uL (ref 0.0–0.5)
Eosinophils Relative: 0 %
HCT: 32 % — ABNORMAL LOW (ref 39.0–52.0)
Hemoglobin: 10.1 g/dL — ABNORMAL LOW (ref 13.0–17.0)
Immature Granulocytes: 1 %
Lymphocytes Relative: 14 %
Lymphs Abs: 2 10*3/uL (ref 0.7–4.0)
MCH: 32 pg (ref 26.0–34.0)
MCHC: 31.6 g/dL (ref 30.0–36.0)
MCV: 101.3 fL — ABNORMAL HIGH (ref 80.0–100.0)
Monocytes Absolute: 1 10*3/uL (ref 0.1–1.0)
Monocytes Relative: 8 %
Neutro Abs: 10.5 10*3/uL — ABNORMAL HIGH (ref 1.7–7.7)
Neutrophils Relative %: 77 %
Platelet Count: 268 10*3/uL (ref 150–400)
RBC: 3.16 MIL/uL — ABNORMAL LOW (ref 4.22–5.81)
RDW: 17.4 % — ABNORMAL HIGH (ref 11.5–15.5)
WBC Count: 13.7 10*3/uL — ABNORMAL HIGH (ref 4.0–10.5)
nRBC: 0 % (ref 0.0–0.2)

## 2019-08-02 LAB — CMP (CANCER CENTER ONLY)
ALT: 12 U/L (ref 0–44)
AST: 24 U/L (ref 15–41)
Albumin: 3.1 g/dL — ABNORMAL LOW (ref 3.5–5.0)
Alkaline Phosphatase: 148 U/L — ABNORMAL HIGH (ref 38–126)
Anion gap: 7 (ref 5–15)
BUN: 11 mg/dL (ref 8–23)
CO2: 26 mmol/L (ref 22–32)
Calcium: 8.6 mg/dL — ABNORMAL LOW (ref 8.9–10.3)
Chloride: 107 mmol/L (ref 98–111)
Creatinine: 1.18 mg/dL (ref 0.61–1.24)
GFR, Est AFR Am: >60 mL/min (ref >60)
GFR, Estimated: >60 mL/min (ref >60)
Glucose, Bld: 101 mg/dL — ABNORMAL HIGH (ref 70–99)
Potassium: 4 mmol/L (ref 3.5–5.1)
Sodium: 140 mmol/L (ref 135–145)
Total Bilirubin: 0.2 mg/dL — ABNORMAL LOW (ref 0.3–1.2)
Total Protein: 7.5 g/dL (ref 6.5–8.1)

## 2019-08-02 LAB — CEA (IN HOUSE-CHCC): CEA (CHCC-In House): 207.51 ng/mL — ABNORMAL HIGH (ref 0.00–5.00)

## 2019-08-02 MED ORDER — PALONOSETRON HCL INJECTION 0.25 MG/5ML
0.2500 mg | Freq: Once | INTRAVENOUS | Status: AC
  Administered 2019-08-02: 0.25 mg via INTRAVENOUS

## 2019-08-02 MED ORDER — SODIUM CHLORIDE 0.9 % IV SOLN
300.0000 mg/m2 | Freq: Once | INTRAVENOUS | Status: AC
  Administered 2019-08-02: 606 mg via INTRAVENOUS
  Filled 2019-08-02: qty 30.3

## 2019-08-02 MED ORDER — ATROPINE SULFATE 1 MG/ML IJ SOLN
INTRAMUSCULAR | Status: AC
  Filled 2019-08-02: qty 1

## 2019-08-02 MED ORDER — FLUOROURACIL CHEMO INJECTION 2.5 GM/50ML
300.0000 mg/m2 | Freq: Once | INTRAVENOUS | Status: AC
  Administered 2019-08-02: 600 mg via INTRAVENOUS
  Filled 2019-08-02: qty 12

## 2019-08-02 MED ORDER — PANTOPRAZOLE SODIUM 40 MG PO TBEC: 40.0000 mg | DELAYED_RELEASE_TABLET | Freq: Every day | ORAL | 3 refills | Status: AC

## 2019-08-02 MED ORDER — PALONOSETRON HCL INJECTION 0.25 MG/5ML
INTRAVENOUS | Status: AC
  Filled 2019-08-02: qty 5

## 2019-08-02 MED ORDER — SODIUM CHLORIDE 0.9% FLUSH
10.0000 mL | INTRAVENOUS | Status: DC | PRN
  Administered 2019-08-02: 10 mL
  Filled 2019-08-02: qty 10

## 2019-08-02 MED ORDER — ATROPINE SULFATE 1 MG/ML IJ SOLN
0.5000 mg | Freq: Once | INTRAMUSCULAR | Status: AC | PRN
  Administered 2019-08-02: 0.5 mg via INTRAVENOUS

## 2019-08-02 MED ORDER — DEXAMETHASONE SODIUM PHOSPHATE 10 MG/ML IJ SOLN
10.0000 mg | Freq: Once | INTRAMUSCULAR | Status: AC
  Administered 2019-08-02: 10 mg via INTRAVENOUS

## 2019-08-02 MED ORDER — SODIUM CHLORIDE 0.9 % IV SOLN
2475.0000 mg/m2 | INTRAVENOUS | Status: DC
  Administered 2019-08-02: 5000 mg via INTRAVENOUS
  Filled 2019-08-02: qty 100

## 2019-08-02 MED ORDER — SODIUM CHLORIDE 0.9 % IV SOLN
Freq: Once | INTRAVENOUS | Status: AC
  Filled 2019-08-02: qty 250

## 2019-08-02 MED ORDER — DEXAMETHASONE SODIUM PHOSPHATE 10 MG/ML IJ SOLN
INTRAMUSCULAR | Status: AC
  Filled 2019-08-02: qty 1

## 2019-08-02 MED ORDER — SODIUM CHLORIDE 0.9 % IV SOLN
125.0000 mg/m2 | Freq: Once | INTRAVENOUS | Status: AC
  Administered 2019-08-02: 260 mg via INTRAVENOUS
  Filled 2019-08-02: qty 13

## 2019-08-02 NOTE — Patient Instructions (Signed)
Tunneled Central Venous Catheter Flushing Guide  It is important to flush your tunneled central venous catheter each time you use it, both before and after you use it. Flushing your catheter will help prevent it from clogging. What are the risks? Risks may include:  Infection.  Air getting into the catheter and bloodstream. Supplies needed:  A clean pair of gloves.  A disinfecting wipe. Use an alcohol wipe, chlorhexidine wipe, or iodine wipe as told by your health care provider.  A 10 mL syringe that has been prefilled with saline solution.  An empty 10 mL syringe, if a substance called heparin was injected into your catheter. How to flush your catheter When you flush your catheter, make sure you follow any specific instructions from your health care provider or the manufacturer. These are general guidelines. Flushing your catheter before use If there is heparin in your catheter: 1. Wash your hands with soap and water. 2. Put on gloves. 3. Scrub the injection cap for a minimum of 15 seconds with a disinfecting wipe. 4. Unclamp the catheter. 5. Attach the empty syringe to the injection cap. 6. Pull the syringe plunger back and withdraw 10 mL of blood. 7. Place the syringe into an appropriate waste container. 8. Scrub the injection cap for 15 seconds with a disinfecting wipe. 9. Attach the prefilled syringe to the injection cap. 10. Flush the catheter by pushing the plunger forward until all the liquid from the syringe is in the catheter. 11. Remove the syringe from the injection cap. 12. Clamp the catheter. If there is no heparin in your catheter: 1. Wash your hands with soap and water. 2. Put on gloves. 3. Scrub the injection cap for 15 seconds with a disinfecting wipe. 4. Unclamp the catheter. 5. Attach the prefilled syringe to the injection cap. 6. Flush the catheter by pushing the plunger forward until 5 mL of the liquid from the syringe is in the catheter. 7. Pull back on  the syringe until you see blood in the catheter. 8. If you have been asked to collect any blood, follow your health care provider's instructions. Otherwise, flush the catheter with the rest of the solution from the syringe. 9. Remove the syringe from the injection cap. 10. Clamp the catheter.  Flushing your catheter after use 1. Wash your hands with soap and water. 2. Put on gloves. 3. Scrub the injection cap for 15 seconds with a disinfecting wipe. 4. Unclamp the catheter. 5. Attach the prefilled syringe to the injection cap. 6. Flush the catheter by pushing the plunger forward until all of the liquid from the syringe is in the catheter. 7. Remove the syringe from the injection cap. 8. Clamp the catheter. Problems and solutions  If blood cannot be completely cleared from the injection cap, you may need to have the injection cap replaced.  If the catheter is difficult to flush, use the pulsing method. The pulsing method involves pushing only a few milliliters of solution into the catheter at a time and pausing between pushes.  If you do not see blood in the catheter when you pull back on the syringe, change your body position, such as by raising your arms above your head. Take a deep breath and cough. Then, pull back on the syringe. If you still do not see blood, flush the catheter with a small amount of solution. Then, change positions again and take a breath or cough. Pull back on the syringe again. If you still do not see   blood, finish flushing the catheter and contact your health care provider. Do not use your catheter until your health care provider says it is okay. General tips  Have someone help you flush your catheter, if possible.  Do not force fluid through your catheter.  Do not use a syringe that is larger or smaller than 10 mL. Using a smaller syringe can make the catheter burst.  Do not use your catheter without flushing it first if it has heparin in it. Contact a health  care provider if:  You cannot see any blood in the catheter when you flush it before using it.  Your catheter is difficult to flush. Get help right away if:  You cannot flush the catheter.  The catheter leaks when you flush it or when there is fluid in it.  There are cracks or breaks in the catheter. Summary  It is important to flush your tunneled central venous catheter each time you use it, both before and after you use it.  Scrub the injection cap for 15 seconds with a disinfecting wipe before and after you flush it.  When you flush your catheter, make sure you follow any specific instructions from your health care provider or the manufacturer.  Get help right away if you cannot flush the catheter. This information is not intended to replace advice given to you by your health care provider. Make sure you discuss any questions you have with your health care provider. Document Revised: 02/12/2019 Document Reviewed: 08/05/2018 Elsevier Patient Education  2020 Elsevier Inc.  

## 2019-08-02 NOTE — Progress Notes (Signed)
Hunter Walker OFFICE PROGRESS NOTE   Diagnosis: Rectal cancer  INTERVAL HISTORY:   Hunter Walker returns as scheduled.  He completed cycle 3 FOLFIRI 07/19/2019.  He denies nausea/vomiting.  No mouth sores.  No diarrhea.  He intermittently notes increased pain after a bowel movement.  He has a small amount of blood with bowel movements periodically.  He wonders if the blood is related to hemorrhoids.  Objective:  Vital signs in last 24 hours:  Blood pressure 118/76, pulse 84, temperature 98.5 F (36.9 C), temperature source Temporal, resp. rate 17, height 6' 1" (1.854 m), weight 169 lb 4.8 oz (76.8 kg), SpO2 100 %.    HEENT: No thrush or ulcers. GI: Abdomen soft and nontender.  No hepatomegaly. Vascular: No leg edema. Neuro: Alert and oriented. Skin: Palms without erythema. Port-A-Cath without erythema.   Lab Results:  Lab Results  Component Value Date   WBC 13.7 (H) 08/02/2019   HGB 10.1 (L) 08/02/2019   HCT 32.0 (L) 08/02/2019   MCV 101.3 (H) 08/02/2019   PLT 268 08/02/2019   NEUTROABS PENDING 08/02/2019    Imaging:  No results found.  Medications: I have reviewed the patient's current medications.  Assessment/Plan: 1.Rectal cancer, invasive adenocarcinoma confirmed at colonoscopy 05/22/2015, EUS staging 06/01/2015 revealed a uT3,N1 lesion at 7 cm from the anal verge  Staging CT abdomen/pelvis 05/15/2015 confirmed a mass at the rectosigmoid colon, a suspicious perirectal lymph node, and a 6 mm right liver lesion felt to most likely be a cyst  CT chest 05/30/2015 with indeterminate pulmonary nodules and multiple liver lesions suspicious for metastases  Ultrasound 06/13/2014 with no liver lesions identified  MRI abdomen 06/21/2015 consistent with multiple liver metastases  CT biopsy of liver lesion 06/26/2015. Pathology showed metastatic adenocarcinoma consistent with colonic primary.MSI-stable, low mutation burden, no RAS or BRAFmutation  Cycle  1 FOLFOX 07/10/2015  Cycle 2 FOLFOX plus Avastin 07/24/2015 with Neulasta support  Cycle 3 FOLFOX plus Avastin 08/07/2015  Cycle 4 FOLFOX plus Avastin 08/21/2015  cycle 5 FOLFOX plus Avastin 09/04/2015  Restaging CTs 09/15/2015 revealed a decrease in the size of liver lesions, decreased rectal primary, stable/decreased size of tiny lung nodules  Cycle 6 FOLFOX plus Avastin 09/18/2015  Cycle 7 FOLFOX plus Avastin 10/02/2015  Cycle 8 FOLFOX plus Avastin 10/16/2015  Cycle 9 FOLFOX plus Avastin 11/06/2015  Cycle 10 FOLFOX plus Avastin 11/20/2015  CTs 12/07/2015-mild decrease in wall thickening at the rectum, decrease in tiny hepatic metastases, stable lung nodules, no evidence of progressive metastatic disease, new left lower lobe infiltrate  Treatment changed to every three-week Avastin with every other week Xeloda beginning 12/11/2015  Restaging CTs 04/12/2016-interval increase in size of adjacent right upper lobe pulmonary nodules and right lower lobe pulmonary nodule; unchanged 5 mm lesion right hepatic lobe; no additional visualized hepatic metastasis. Grossly unchanged wall thickening of the rectum.  Continuation of Xeloda every other week and every three-week AvastinXeloda placed on hold 05/06/2016 due to hand-foot syndrome  Xeloda resumed 05/28/2016 1500 mg every morning and 1000 mg every afternoon 7 days on/7 days off  CT 09/27/2016-enlargement of lung nodules and new liver lesions, stable rectal mass  Cycle 1 FOLFIRI/panitumumab 10/08/2016  Cycle 2 panitumumab 10/23/2016 (FOLFIRI held secondary to neutropenia)  Cycle 3 FOLFIRI/PANITUMUMAB 11/04/2016  Cycle 4 FOLFIRI/panitumumab 11/18/2016  Cycle 5 FOLFIRI/panitumumab 12/16/2016  Cycle 6 FOLFIRI/panitumumab 12/31/2016  CTs 01/16/2017-decreased size of pulmonary nodules and liver lesions. Decreased perirectal lymph node  Cycle 7 FOLFIRI/PANITUMUMAB 01/28/2017  Cycle 8FOLFIRI/PANITUMUMAB 02/10/2017    Cycle 9  FOLFIRI/PANITUMUMAB 02/24/2017  Cycle 10 FOLFIRI/panitumumab 03/10/2017 (irinotecan held secondary to neutropenia)  Cycle 11 FOLFIRI/panitumumab 03/24/2017  Cycle12 FOLFIRI/Panitumumab 04/07/2017  Restaging CTs 04/25/2017-stable rectal mass with adjacent 8 mm left perirectal nodal metastasis. Prior hepatic metastases not visualized on the current study. Indeterminate 16 mm enhancing lesion present in segment 6, indeterminate. Minimal residual nodularity in the right upper lobe. No new/progressive pulmonary metastases.  Maintenance Xeloda/panitumumab 04/28/2017  Restaging CTs 08/18/2017-new and enlarging bilateral lung nodules; 2 hyperattenuating lesions in the liver appear similar to prior study; progression of wall thickening in the rectum with enlarging perirectal lymph nodes.  Initiation of radiation/Xeloda 09/01/2017, completed 09/22/2017  Cycle 1 FOLFIRI/Panitumumab 10/13/2017  Cycle 2 FOLFIRI 11/03/2017  Cycle 3 FOLFIRI 11/17/2017  Cycle 4 FOLFIRI 12/01/2017  Cycle 5 FOLFIRI 12/15/2017  CTs7/26/2019-mild progression lungs and liver; significantly improved rectal wall thickening; resolution of perirectal nodal adenopathy.  Cycle 6 FOLFIRI 12/29/2017  Cycle 7 FOLFIRI 01/12/2018  Cycle 8 FOLFIRI 01/26/2018  Cycle 9 FOLFIRI 02/09/2018  Cycle 10 FOLFIRI 02/23/2018  Restaging CTs 03/06/2018-majority of the pulmonary nodules are similar in size, at least 2 of the nodules are minimally increased in size. Similar appearing lesions within the liver. Similar-appearing mild wall thickening of the rectum.  Cycle 11FOLFIRI 03/09/2018  Cycle 12 FOLFIRI 03/23/2018  Cycle 13 FOLFIRI 04/06/2018  Cycle 14 FOLFIRI 04/20/2018  Cycle 15 FOLFIRI 05/04/2018  CT 05/15/2018-no change in pulmonary nodules, mild increase in size of 2 liver lesions, stable rectal wall thickening  On study at [facility], [facility] [id number] with palbociclib andUlixertinibbeginning 07/15/2018  [facility]  [facility]  Restaging CTs 10/07/2018-enlargement of multiple lateral pulmonary nodules and liver lesions; redemonstrated circumferential thickening of the mid rectum with interval enlargement of an irregular nodule of the left perirectal fat.  Cycle 1 FOLFOX 10/12/2018  Cycle 2 FOLFOX 11/02/2018 (allergic reaction to oxaliplatin, oxaliplatin discontinued)  Guardant testing 11/30/2018-KRAS Q61H,EGFR amplification, MSI high not detected(NOTE:Plan for repeat guardant testing at a 3-month interval)  Cycle 1 Lonsurf 01/11/2019  Cycle 2 Lonsurf 02/08/2019, dose reduced  Every 2-week Avastin beginning 02/09/2019  Cycle 3 Lonsurf 03/08/2019  Cycle 4 Lonsurf 04/12/2019  CTs 05/07/2019-significant disease progression in the liver, minimal increase in pulmonary metastatic disease, increase in left perirectal nodularity, persistent wall thickening in the lower rectum  Repeat guardant 360 testing 06/03/2019-K-rasQ61Halteration, tumor mutation burden 64, MSI-high not detected  Cycle 1 FOLFIRI 06/14/2019  Liver biopsy 06/22/2019-metastatic adenocarcinoma, Foundation 1- KRASQ61 TMB 13, MSS EGFR amplification  Cycle 2 FOLFIRI 07/05/2019  Cycle 3 FOLFIRI 07/19/2019  Cycle 4 FOLFIRI 08/02/2019 2.Multiple colon polyps on the colonoscopy 05/22/2015, tubular villous adenoma and a tubular adenoma were removed 3.Anemia-likely secondary to rectal bleeding.  4.Family history of multiple cancers including breast and prostate cancer 5.Transient right arm numbness 06/20/2015 6. Port-A-Cath placement 07/06/2015 by Dr. Thomas 7. Mild neutropenia secondary to chemotherapy following cycle 1 FOLFOX. Neulasta added with cycle 2. 8. Delayed nausea following chemotherapy-emend added with cycle 3 FOLFOX;Emend added with cycle 12 FOLFIRI. 9. Early oxaliplatin neuropathy 10. 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 Xeloda resumed at a reduced dose. 11. Bilateral hand weakness. Question related to  previous oxaliplatin. Referred to neurology 06/17/2016. 12. Hypertension-amlodipine started 07/08/2016 13. Rash secondary to PANITUMUMAB.  14. Neutropenia following cycle 1 FOLFIRI/PANITUMUMAB; neutropenia following cycle 4 FOLFIRI/PANITUMUMAB. 15.Neutropenia secondary to Lonsurf-cycle 4 of Lonsurf delayed until 04/12/2019  Disposition: [name] appears stable.  He has completed 3 cycles of FOLFIRI.  He is tolerating chemotherapy well.  Plan to   proceed with cycle 4 today as scheduled.  We reviewed the CBC from today.  Counts adequate to proceed with treatment.  He will return for lab, follow-up, FOLFIRI in 2 weeks.  He will contact the office in the interim with any problems.    Ned Card ANP/GNP-BC   08/02/2019  11:44 AM

## 2019-08-02 NOTE — Patient Instructions (Signed)
[facility] Discharge Instructions for Patients Receiving Chemotherapy  Today you received the following chemotherapy agents :  Irinotecan, Leucovorin, Fluorouracil.  To help prevent nausea and vomiting after your treatment, we encourage you to take your nausea medication as prescribed.   If you develop nausea and vomiting that is not controlled by your nausea medication, call the clinic.   BELOW ARE SYMPTOMS THAT SHOULD BE REPORTED IMMEDIATELY:  *FEVER GREATER THAN 100.5 F  *CHILLS WITH OR WITHOUT FEVER  NAUSEA AND VOMITING THAT IS NOT CONTROLLED WITH YOUR NAUSEA MEDICATION  *UNUSUAL SHORTNESS OF BREATH  *UNUSUAL BRUISING OR BLEEDING  TENDERNESS IN MOUTH AND THROAT WITH OR WITHOUT PRESENCE OF ULCERS  *URINARY PROBLEMS  *BOWEL PROBLEMS  UNUSUAL RASH Items with * indicate a potential emergency and should be followed up as soon as possible.  Feel free to call the clinic should you have any questions or concerns. The clinic phone number is [phone].  Please show the CHEMO ALERT CARD at check-in to the Emergency Department and triage nurse.   

## 2019-08-03 ENCOUNTER — Telehealth: Payer: Self-pay | Admitting: Oncology

## 2019-08-03 NOTE — Telephone Encounter (Signed)
Scheduled per 3/1 los. Called and spoke with patient. Confirmed appt  

## 2019-08-04 ENCOUNTER — Inpatient Hospital Stay: Payer: Medicare HMO

## 2019-08-04 ENCOUNTER — Other Ambulatory Visit: Payer: Self-pay

## 2019-08-04 VITALS — BP 122/72 | HR 82 | Temp 98.2°F | Resp 18

## 2019-08-04 DIAGNOSIS — D649 Anemia, unspecified: Secondary | ICD-10-CM | POA: Diagnosis not present

## 2019-08-04 DIAGNOSIS — Z5111 Encounter for antineoplastic chemotherapy: Secondary | ICD-10-CM | POA: Diagnosis not present

## 2019-08-04 DIAGNOSIS — R918 Other nonspecific abnormal finding of lung field: Secondary | ICD-10-CM | POA: Diagnosis not present

## 2019-08-04 DIAGNOSIS — R197 Diarrhea, unspecified: Secondary | ICD-10-CM | POA: Diagnosis not present

## 2019-08-04 DIAGNOSIS — D709 Neutropenia, unspecified: Secondary | ICD-10-CM | POA: Diagnosis not present

## 2019-08-04 DIAGNOSIS — C20 Malignant neoplasm of rectum: Secondary | ICD-10-CM | POA: Diagnosis not present

## 2019-08-04 DIAGNOSIS — Z5189 Encounter for other specified aftercare: Secondary | ICD-10-CM | POA: Diagnosis not present

## 2019-08-04 DIAGNOSIS — K649 Unspecified hemorrhoids: Secondary | ICD-10-CM | POA: Diagnosis not present

## 2019-08-04 DIAGNOSIS — C787 Secondary malignant neoplasm of liver and intrahepatic bile duct: Secondary | ICD-10-CM | POA: Diagnosis not present

## 2019-08-04 MED ORDER — PEGFILGRASTIM-JMDB 6 MG/0.6ML ~~LOC~~ SOSY
6.0000 mg | PREFILLED_SYRINGE | Freq: Once | SUBCUTANEOUS | Status: AC
  Administered 2019-08-04: 6 mg via SUBCUTANEOUS

## 2019-08-04 MED ORDER — SODIUM CHLORIDE 0.9% FLUSH
10.0000 mL | INTRAVENOUS | Status: DC | PRN
  Administered 2019-08-04: 10 mL
  Filled 2019-08-04: qty 10

## 2019-08-04 MED ORDER — PEGFILGRASTIM-JMDB 6 MG/0.6ML ~~LOC~~ SOSY
PREFILLED_SYRINGE | SUBCUTANEOUS | Status: AC
  Filled 2019-08-04: qty 0.6

## 2019-08-04 MED ORDER — HEPARIN SOD (PORK) LOCK FLUSH 100 UNIT/ML IV SOLN
500.0000 [IU] | Freq: Once | INTRAVENOUS | Status: AC | PRN
  Administered 2019-08-04: 500 [IU]
  Filled 2019-08-04: qty 5

## 2019-08-04 NOTE — Patient Instructions (Signed)

## 2019-08-15 ENCOUNTER — Other Ambulatory Visit: Payer: Self-pay | Admitting: Oncology

## 2019-08-16 ENCOUNTER — Ambulatory Visit: Payer: Medicare HMO | Attending: Internal Medicine

## 2019-08-16 ENCOUNTER — Inpatient Hospital Stay: Payer: Medicare HMO

## 2019-08-16 ENCOUNTER — Inpatient Hospital Stay: Payer: Medicare HMO | Admitting: Nurse Practitioner

## 2019-08-16 ENCOUNTER — Telehealth: Payer: Self-pay

## 2019-08-16 DIAGNOSIS — Z23 Encounter for immunization: Secondary | ICD-10-CM

## 2019-08-16 NOTE — Progress Notes (Signed)
   Covid-19 Vaccination Clinic  Name:  SEM Walker    MRN: [id number] DOB: 02-22-1951  08/16/2019  Hunter Walker was observed post Covid-19 immunization for 15 minutes without incident. He was provided with Vaccine Information Sheet and instruction to access the V-Safe system.   Hunter Walker was instructed to call 911 with any severe reactions post vaccine: Marland Kitchen Difficulty breathing  . Swelling of face and throat  . A fast heartbeat  . A bad rash all over body  . Dizziness and weakness   Immunizations Administered    Name Date Dose VIS Date Route   Pfizer COVID-19 Vaccine 08/16/2019  8:47 AM 0.3 mL 05/14/2019 Intramuscular   Manufacturer: [place]   Lot: [id number]   [place]: [id number]

## 2019-08-16 NOTE — Telephone Encounter (Signed)
Patient called and requested that all appointments be canceled for today. Patient stated that he just got his covid injection this morning and is afraid that he will have a reaction and would like to reschedule for next week. Schedule message sent

## 2019-08-17 ENCOUNTER — Other Ambulatory Visit: Payer: Medicare HMO

## 2019-08-17 ENCOUNTER — Ambulatory Visit: Payer: Medicare HMO | Admitting: Oncology

## 2019-08-17 ENCOUNTER — Ambulatory Visit: Payer: Medicare HMO

## 2019-08-18 ENCOUNTER — Inpatient Hospital Stay: Payer: Medicare HMO

## 2019-08-25 ENCOUNTER — Inpatient Hospital Stay: Payer: Medicare HMO | Admitting: Nurse Practitioner

## 2019-08-25 ENCOUNTER — Inpatient Hospital Stay: Payer: Medicare HMO

## 2019-08-25 ENCOUNTER — Telehealth: Payer: Self-pay | Admitting: Oncology

## 2019-08-25 NOTE — Telephone Encounter (Signed)
Scheduled appt per 3/24 sch message - pt is aware of new appt date and time

## 2019-08-27 ENCOUNTER — Inpatient Hospital Stay: Payer: Medicare HMO

## 2019-08-30 ENCOUNTER — Inpatient Hospital Stay: Payer: Medicare HMO

## 2019-08-30 ENCOUNTER — Other Ambulatory Visit: Payer: Self-pay

## 2019-08-30 ENCOUNTER — Telehealth: Payer: Self-pay | Admitting: *Deleted

## 2019-08-30 ENCOUNTER — Inpatient Hospital Stay (HOSPITAL_BASED_OUTPATIENT_CLINIC_OR_DEPARTMENT_OTHER): Payer: Medicare HMO | Admitting: Oncology

## 2019-08-30 ENCOUNTER — Telehealth: Payer: Self-pay | Admitting: Oncology

## 2019-08-30 VITALS — BP 120/70 | HR 70 | Temp 98.3°F | Resp 16 | Ht 73.0 in | Wt 165.4 lb

## 2019-08-30 DIAGNOSIS — C20 Malignant neoplasm of rectum: Secondary | ICD-10-CM

## 2019-08-30 DIAGNOSIS — D649 Anemia, unspecified: Secondary | ICD-10-CM | POA: Diagnosis not present

## 2019-08-30 DIAGNOSIS — R197 Diarrhea, unspecified: Secondary | ICD-10-CM | POA: Diagnosis not present

## 2019-08-30 DIAGNOSIS — D709 Neutropenia, unspecified: Secondary | ICD-10-CM | POA: Diagnosis not present

## 2019-08-30 DIAGNOSIS — C787 Secondary malignant neoplasm of liver and intrahepatic bile duct: Secondary | ICD-10-CM | POA: Diagnosis not present

## 2019-08-30 DIAGNOSIS — K649 Unspecified hemorrhoids: Secondary | ICD-10-CM | POA: Diagnosis not present

## 2019-08-30 DIAGNOSIS — R918 Other nonspecific abnormal finding of lung field: Secondary | ICD-10-CM | POA: Diagnosis not present

## 2019-08-30 DIAGNOSIS — Z95828 Presence of other vascular implants and grafts: Secondary | ICD-10-CM

## 2019-08-30 DIAGNOSIS — Z5189 Encounter for other specified aftercare: Secondary | ICD-10-CM | POA: Diagnosis not present

## 2019-08-30 DIAGNOSIS — Z5111 Encounter for antineoplastic chemotherapy: Secondary | ICD-10-CM | POA: Diagnosis not present

## 2019-08-30 LAB — CMP (CANCER CENTER ONLY)
ALT: 15 U/L (ref 0–44)
AST: 33 U/L (ref 15–41)
Albumin: 3.1 g/dL — ABNORMAL LOW (ref 3.5–5.0)
Alkaline Phosphatase: 143 U/L — ABNORMAL HIGH (ref 38–126)
Anion gap: 10 (ref 5–15)
BUN: 14 mg/dL (ref 8–23)
CO2: 24 mmol/L (ref 22–32)
Calcium: 9.7 mg/dL (ref 8.9–10.3)
Chloride: 104 mmol/L (ref 98–111)
Creatinine: 1.23 mg/dL (ref 0.61–1.24)
GFR, Est AFR Am: >60 mL/min (ref >60)
GFR, Estimated: >60 mL/min (ref >60)
Glucose, Bld: 157 mg/dL — ABNORMAL HIGH (ref 70–99)
Potassium: 4.4 mmol/L (ref 3.5–5.1)
Sodium: 138 mmol/L (ref 135–145)
Total Bilirubin: 0.4 mg/dL (ref 0.3–1.2)
Total Protein: 8.3 g/dL — ABNORMAL HIGH (ref 6.5–8.1)

## 2019-08-30 LAB — CEA (IN HOUSE-CHCC): CEA (CHCC-In House): 361.1 ng/mL — ABNORMAL HIGH (ref 0.00–5.00)

## 2019-08-30 LAB — CBC WITH DIFFERENTIAL (CANCER CENTER ONLY)
Abs Immature Granulocytes: 0.06 10*3/uL (ref 0.00–0.07)
Basophils Absolute: 0.1 10*3/uL (ref 0.0–0.1)
Basophils Relative: 1 %
Eosinophils Absolute: 0.1 10*3/uL (ref 0.0–0.5)
Eosinophils Relative: 1 %
HCT: 31.8 % — ABNORMAL LOW (ref 39.0–52.0)
Hemoglobin: 10 g/dL — ABNORMAL LOW (ref 13.0–17.0)
Immature Granulocytes: 0 %
Lymphocytes Relative: 8 %
Lymphs Abs: 1.4 10*3/uL (ref 0.7–4.0)
MCH: 30.9 pg (ref 26.0–34.0)
MCHC: 31.4 g/dL (ref 30.0–36.0)
MCV: 98.1 fL (ref 80.0–100.0)
Monocytes Absolute: 1 10*3/uL (ref 0.1–1.0)
Monocytes Relative: 6 %
Neutro Abs: 14.6 10*3/uL — ABNORMAL HIGH (ref 1.7–7.7)
Neutrophils Relative %: 84 %
Platelet Count: 345 10*3/uL (ref 150–400)
RBC: 3.24 MIL/uL — ABNORMAL LOW (ref 4.22–5.81)
RDW: 18.5 % — ABNORMAL HIGH (ref 11.5–15.5)
WBC Count: 17.3 10*3/uL — ABNORMAL HIGH (ref 4.0–10.5)
nRBC: 0 % (ref 0.0–0.2)

## 2019-08-30 MED ORDER — SODIUM CHLORIDE 0.9% FLUSH
10.0000 mL | INTRAVENOUS | Status: DC | PRN
  Administered 2019-08-30: 10 mL via INTRAVENOUS
  Filled 2019-08-30: qty 10

## 2019-08-30 MED ORDER — HEPARIN SOD (PORK) LOCK FLUSH 100 UNIT/ML IV SOLN
500.0000 [IU] | Freq: Once | INTRAVENOUS | Status: AC
  Administered 2019-08-30: 500 [IU] via INTRAVENOUS
  Filled 2019-08-30: qty 5

## 2019-08-30 MED ORDER — SODIUM CHLORIDE 0.9% FLUSH
10.0000 mL | Freq: Once | INTRAVENOUS | Status: AC
  Administered 2019-08-30: 10 mL
  Filled 2019-08-30: qty 10

## 2019-08-30 NOTE — Progress Notes (Signed)
Hunter Walker   Diagnosis: Rectal cancer  INTERVAL HISTORY:   Hunter Walker returns for a scheduled visit.  He was last treated with FOLFIRI on 08/02/2019.  He reports diarrhea for several weeks following chemotherapy.  The diarrhea has improved.  He continues to have pain with bowel movements.  He had one episode of rectal bleeding 3 weeks ago.  He feels "weak".  He has intermittent abdominal pain.  Hunter Walker says he would like to change treatment as he does not feel the FOLFIRI is helping.  His appetite has diminished.  No nausea or vomiting.  He has received both doses of the COVID-19 vaccine.  Objective:  Vital signs in last 24 hours:  Blood pressure 120/70, pulse 70, temperature 98.3 F (36.8 C), resp. rate 16, height _0  (1.854 m), weight 165 lb 6.4 oz (75 kg), SpO2 100 %.     Resp: Lungs clear bilaterally Cardio: Regular rate and rhythm GI: No hepatomegaly, no mass, nontender, no apparent ascites Vascular: No leg edema  Skin: Dryness over the back  Portacath/PICC-without erythema  Lab Results:  Lab Results  Component Value Date   WBC 17.3 (H) 08/30/2019   HGB 10.0 (L) 08/30/2019   HCT 31.8 (L) 08/30/2019   MCV 98.1 08/30/2019   PLT 345 08/30/2019   NEUTROABS 14.6 (H) 08/30/2019    CMP  Lab Results  Component Value Date   NA 138 08/30/2019   K 4.4 08/30/2019   CL 104 08/30/2019   CO2 24 08/30/2019   GLUCOSE 157 (H) 08/30/2019   BUN 14 08/30/2019   CREATININE 1.23 08/30/2019   CALCIUM 9.7 08/30/2019   PROT 8.3 (H) 08/30/2019   ALBUMIN 3.1 (L) 08/30/2019   AST 33 08/30/2019   ALT 15 08/30/2019   ALKPHOS 143 (H) 08/30/2019   BILITOT 0.4 08/30/2019   GFRNONAA >60 08/30/2019   GFRAA >60 08/30/2019    Lab Results  Component Value Date   CEA1 207.51 (H) 08/02/2019    Medications: I have reviewed the patient's current medications.   Assessment/Plan: 1.  Rectal cancer, invasive adenocarcinoma confirmed at  colonoscopy 05/22/2015, EUS staging 06/01/2015 revealed a uT3,N1 lesion at 7 cm from the anal verge  Staging CT abdomen/pelvis 05/15/2015 confirmed a mass at the rectosigmoid colon, a suspicious perirectal lymph node, and a 6 mm right liver lesion felt to most likely be a cyst  CT chest 05/30/2015 with indeterminate pulmonary nodules and multiple liver lesions suspicious for metastases  Ultrasound 06/13/2014 with no liver lesions identified  MRI abdomen 06/21/2015 consistent with multiple liver metastases  CT biopsy of liver lesion 06/26/2015. Pathology showed metastatic adenocarcinoma consistent with colonic primary.MSI-stable, low mutation burden, no RAS or BRAFmutation  Cycle 1 FOLFOX 07/10/2015  Cycle 2 FOLFOX plus Avastin 07/24/2015 with Neulasta support  Cycle 3 FOLFOX plus Avastin 08/07/2015  Cycle 4 FOLFOX plus Avastin 08/21/2015  cycle 5 FOLFOX plus Avastin 09/04/2015  Restaging CTs 09/15/2015 revealed a decrease in the size of liver lesions, decreased rectal primary, stable/decreased size of tiny lung nodules  Cycle 6 FOLFOX plus Avastin 09/18/2015  Cycle 7 FOLFOX plus Avastin 10/02/2015  Cycle 8 FOLFOX plus Avastin 10/16/2015  Cycle 9 FOLFOX plus Avastin 11/06/2015  Cycle 10 FOLFOX plus Avastin 11/20/2015  CTs 12/07/2015-mild decrease in wall thickening at the rectum, decrease in tiny hepatic metastases, stable lung nodules, no evidence of progressive metastatic disease, new left lower lobe infiltrate  Treatment changed to every three-week Avastin with every other week  Xeloda beginning 12/11/2015  Restaging CTs 04/12/2016-interval increase in size of adjacent right upper lobe pulmonary nodules and right lower lobe pulmonary nodule; unchanged 5 mm lesion right hepatic lobe; no additional visualized hepatic metastasis. Grossly unchanged wall thickening of the rectum.  Continuation of Xeloda every other week and every three-week AvastinXeloda placed on hold  05/06/2016 due to hand-foot syndrome  Xeloda resumed 05/28/2016 1500 mg every morning and 1000 mg every afternoon 7 days on/7 days off  CT 09/27/2016-enlargement of lung nodules and new liver lesions, stable rectal mass  Cycle 1 FOLFIRI/panitumumab 10/08/2016  Cycle 2 panitumumab 10/23/2016 (FOLFIRI held secondary to neutropenia)  Cycle 3 FOLFIRI/PANITUMUMAB 11/04/2016  Cycle 4 FOLFIRI/panitumumab 11/18/2016  Cycle 5 FOLFIRI/panitumumab 12/16/2016  Cycle 6 FOLFIRI/panitumumab 12/31/2016  CTs 01/16/2017-decreased size of pulmonary nodules and liver lesions. Decreased perirectal lymph node  Cycle 7 FOLFIRI/PANITUMUMAB 01/28/2017  Cycle 8FOLFIRI/PANITUMUMAB 02/10/2017  Cycle 9 FOLFIRI/PANITUMUMAB 02/24/2017  Cycle 10 FOLFIRI/panitumumab 03/10/2017 (irinotecan held secondary to neutropenia)  Cycle 11 FOLFIRI/panitumumab 03/24/2017  Cycle12 FOLFIRI/Panitumumab 04/07/2017  Restaging CTs 04/25/2017-stable rectal mass with adjacent 8 mm left perirectal nodal metastasis. Prior hepatic metastases not visualized on the current study. Indeterminate 16 mm enhancing lesion present in segment 6, indeterminate. Minimal residual nodularity in the right upper lobe. No new/progressive pulmonary metastases.  Maintenance Xeloda/panitumumab 04/28/2017  Restaging CTs 08/18/2017-new and enlarging bilateral lung nodules; 2 hyperattenuating lesions in the liver appear similar to prior study; progression of wall thickening in the rectum with enlarging perirectal lymph nodes.  Initiation of radiation/Xeloda 09/01/2017, completed 09/22/2017  Cycle 1 FOLFIRI/Panitumumab 10/13/2017  Cycle 2 FOLFIRI 11/03/2017  Cycle 3 FOLFIRI 11/17/2017  Cycle 4 FOLFIRI 12/01/2017  Cycle 5 FOLFIRI 12/15/2017  CTs7/26/2019-mild progression lungs and liver; significantly improved rectal wall thickening; resolution of perirectal nodal adenopathy.  Cycle 6 FOLFIRI 12/29/2017  Cycle 7 FOLFIRI 01/12/2018  Cycle 8  FOLFIRI 01/26/2018  Cycle 9 FOLFIRI 02/09/2018  Cycle 10 FOLFIRI 02/23/2018  Restaging CTs 03/06/2018-majority of the pulmonary nodules are similar in size, at least 2 of the nodules are minimally increased in size. Similar appearing lesions within the liver. Similar-appearing mild wall thickening of the rectum.  Cycle 11FOLFIRI 03/09/2018  Cycle 12 FOLFIRI 03/23/2018  Cycle 13 FOLFIRI 04/06/2018  Cycle 14 FOLFIRI 04/20/2018  Cycle 15 FOLFIRI 05/04/2018  CT 05/15/2018-no change in pulmonary nodules, mild increase in size of 2 liver lesions, stable rectal wall thickening  On study at [facility] [place] with palbociclib andUlixertinibbeginning 07/15/2018  [facility]  Restaging CTs 10/07/2018-enlargement of multiple lateral pulmonary nodules and liver lesions; redemonstrated circumferential thickening of the mid rectum with interval enlargement of an irregular nodule of the left perirectal fat.  Cycle 1 FOLFOX 10/12/2018  Cycle 2 FOLFOX 11/02/2018 (allergic reaction to oxaliplatin, oxaliplatin discontinued)  Guardant testing 11/30/2018-KRAS Q61H,EGFR amplification, MSI high not detected(Walker:Plan for repeat guardant testing at a 58-monthinterval)  Cycle 1 Lonsurf 01/11/2019  Cycle 2 Lonsurf 02/08/2019, dose reduced  Every 2-week Avastin beginning 02/09/2019  Cycle 3 Lonsurf 03/08/2019  Cycle 4 Lonsurf 04/12/2019  CTs 05/07/2019-significant disease progression in the liver, minimal increase in pulmonary metastatic disease, increase in left perirectal nodularity, persistent wall thickening in the lower rectum  Repeat guardant 360 testing 06/03/2019-K-rasQ61Halteration, tumor mutation burden 64, MSI-high not detected  Cycle 1 FOLFIRI 06/14/2019  Liver biopsy 06/22/2019-metastatic adenocarcinoma, Foundation 1- KRASQ61 TMB 13, MSS EGFR amplification  Cycle 2 FOLFIRI 07/05/2019  Cycle 3 FOLFIRI 07/19/2019  Cycle 4 FOLFIRI 08/02/2019 2.Multiple colon polyps on the  colonoscopy 05/22/2015, tubular  villous adenoma and a tubular adenoma were removed 3.Anemia-likely secondary to rectal bleeding.  4.Family history of multiple cancers including breast and prostate cancer 5.Transient right arm numbness 06/20/2015 6. Port-A-Cath placement 07/06/2015 by Dr. Marcello Moores 7. Mild neutropenia secondary to chemotherapy following cycle 1 FOLFOX. Neulasta added with cycle 2. 8. Delayed nausea following chemotherapy-emend added with cycle 3 FOLFOX;Emend added with cycle 12 FOLFIRI. 9. Early oxaliplatin neuropathy 10. 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 Xeloda resumed at a reduced dose. 11. Bilateral hand weakness. Question related to previous oxaliplatin. Referred to neurology 06/17/2016. 12. Hypertension-amlodipine started 07/08/2016 13. Rash secondary to PANITUMUMAB.  14. Neutropenia following cycle 1 FOLFIRI/PANITUMUMAB; neutropenia following cycle 4 FOLFIRI/PANITUMUMAB. 15.Neutropenia secondary to Lonsurf-cycle 4 of Lonsurf delayed until 04/12/2019    Disposition: Hunter Walker has experienced a decline in his performance status over the past several weeks.  He has completed 4 cycles of FOLFIRI since resuming systemic therapy in January.  We will follow up on the CEA from today.  We decided to place FOLFIRI on hold.  He will undergo a restaging CT this week.  The tumor mutation burden returned at 38 on the liver biopsy from 06/22/2019.  The plan is to begin pembrolizumab therapy on 09/06/2019.  We reviewed potential toxicities associated with pembrolizumab including the chance for a rash, diarrhea, pneumonitis, hepatitis, and other autoimmune toxicities.  He agrees to proceed.  Hunter Walker will continue a stool softener and MiraLAX as needed for constipation.  The abdominal pain may be related to metastatic disease involving the liver or obstructive symptoms from the rectal mass.  Betsy Coder, MD  08/30/2019  9:02 AM

## 2019-08-30 NOTE — Patient Instructions (Addendum)
Pembrolizumab injection What is this medicine? PEMBROLIZUMAB (pem broe liz ue mab) is a monoclonal antibody. It is used to treat certain types of cancer. This medicine may be used for other purposes; ask your health care provider or pharmacist if you have questions. COMMON BRAND NAME(S): Keytruda What should I tell my health care provider before I take this medicine? They need to know if you have any of these conditions:  diabetes  immune system problems  inflammatory bowel disease  liver disease  lung or breathing disease  lupus  received or scheduled to receive an organ transplant or a stem-cell transplant that uses donor stem cells  an unusual or allergic reaction to pembrolizumab, other medicines, foods, dyes, or preservatives  pregnant or trying to get pregnant  breast-feeding How should I use this medicine? This medicine is for infusion into a vein. It is given by a health care professional in a hospital or clinic setting. A special MedGuide will be given to you before each treatment. Be sure to read this information carefully each time. Talk to your pediatrician regarding the use of this medicine in children. While this drug may be prescribed for children as young as 6 months for selected conditions, precautions do apply. Overdosage: If you think you have taken too much of this medicine contact a poison control center or emergency room at once. NOTE: This medicine is only for you. Do not share this medicine with others. What if I miss a dose? It is important not to miss your dose. Call your doctor or health care professional if you are unable to keep an appointment. What may interact with this medicine? Interactions have not been studied. Give your health care provider a list of all the medicines, herbs, non-prescription drugs, or dietary supplements you use. Also tell them if you smoke, drink alcohol, or use illegal drugs. Some items may interact with your medicine. This  list may not describe all possible interactions. Give your health care provider a list of all the medicines, herbs, non-prescription drugs, or dietary supplements you use. Also tell them if you smoke, drink alcohol, or use illegal drugs. Some items may interact with your medicine. What should I watch for while using this medicine? Your condition will be monitored carefully while you are receiving this medicine. You may need blood work done while you are taking this medicine. Do not become pregnant while taking this medicine or for 4 months after stopping it. Women should inform their doctor if they wish to become pregnant or think they might be pregnant. There is a potential for serious side effects to an unborn child. Talk to your health care professional or pharmacist for more information. Do not breast-feed an infant while taking this medicine or for 4 months after the last dose. What side effects may I notice from receiving this medicine? Side effects that you should report to your doctor or health care professional as soon as possible:  allergic reactions like skin rash, itching or hives, swelling of the face, lips, or tongue  bloody or black, tarry  breathing problems  changes in vision  chest pain  chills  confusion  constipation  cough  diarrhea  dizziness or feeling faint or lightheaded  fast or irregular heartbeat  fever  flushing  joint pain  low blood counts - this medicine may decrease the number of white blood cells, red blood cells and platelets. You may be at increased risk for infections and bleeding.  muscle pain  muscle   weakness  pain, tingling, numbness in the hands or feet  persistent headache  redness, blistering, peeling or loosening of the skin, including inside the mouth  signs and symptoms of high blood sugar such as dizziness; dry mouth; dry skin; fruity breath; nausea; stomach pain; increased hunger or thirst; increased urination  signs  and symptoms of kidney injury like trouble passing urine or change in the amount of urine  signs and symptoms of liver injury like dark urine, light-colored stools, loss of appetite, nausea, right upper belly pain, yellowing of the eyes or skin  sweating  swollen lymph nodes  weight loss Side effects that usually do not require medical attention (report to your doctor or health care professional if they continue or are bothersome):  decreased appetite  hair loss  muscle pain  tiredness This list may not describe all possible side effects. Call your doctor for medical advice about side effects. You may report side effects to FDA at 1-800-FDA-1088. Where should I keep my medicine? This drug is given in a hospital or clinic and will not be stored at home. NOTE: This sheet is a summary. It may not cover all possible information. If you have questions about this medicine, talk to your doctor, pharmacist, or health care provider.  2020 Elsevier/Gold Standard (2019-03-26 18:07:58)  

## 2019-08-30 NOTE — Telephone Encounter (Signed)
Notified managed care for need for PA on CT scan and Keytruda to begin on 09/06/19

## 2019-08-30 NOTE — Telephone Encounter (Signed)
Scheduled per los. Gave avs and calendar  

## 2019-08-31 NOTE — Progress Notes (Signed)
Pharmacist Chemotherapy Monitoring - Follow Up Assessment    I verify that I have reviewed each item in the below checklist:  . Regimen for the patient is scheduled for the appropriate day and plan matches scheduled date. Marland Kitchen Appropriate non-routine labs are ordered dependent on drug ordered. . If applicable, additional medications reviewed and ordered per protocol based on lifetime cumulative doses and/or treatment regimen.   Plan for follow-up and/or issues identified: No . I-vent associated with next due treatment: No . MD and/or nursing notified: No  Elison Worrel D 08/31/2019 2:46 PM

## 2019-09-01 ENCOUNTER — Inpatient Hospital Stay: Payer: Medicare HMO

## 2019-09-05 ENCOUNTER — Other Ambulatory Visit: Payer: Self-pay | Admitting: Oncology

## 2019-09-06 ENCOUNTER — Inpatient Hospital Stay: Payer: Medicare HMO | Admitting: Nurse Practitioner

## 2019-09-06 ENCOUNTER — Telehealth: Payer: Self-pay | Admitting: *Deleted

## 2019-09-06 ENCOUNTER — Ambulatory Visit (HOSPITAL_COMMUNITY): Payer: Medicare HMO

## 2019-09-06 ENCOUNTER — Inpatient Hospital Stay: Payer: Medicare HMO

## 2019-09-06 NOTE — Telephone Encounter (Signed)
Left VM reporting N/V "all day". Had to cancel his CT scan today and rescheduled for 09/13/19.  Called him back at 1545: reports he stopped vomiting at ~ 1 pm and feels better. Bowels are still moving. Offered appointment for tomorrow and he declined. He agrees to call tomorrow if not feeling better. Per Dr. Benay Spice: Needs OV and pembrolizumab on 09/09/19--high priority scheduling message sent.

## 2019-09-08 ENCOUNTER — Telehealth: Payer: Self-pay | Admitting: Oncology

## 2019-09-08 ENCOUNTER — Telehealth: Payer: Self-pay | Admitting: *Deleted

## 2019-09-08 NOTE — Telephone Encounter (Signed)
Called pt to confirm appts on 4/16 - pt aware

## 2019-09-08 NOTE — Telephone Encounter (Signed)
Called to report he is not able to come tomorrow for appointment due to wife needing him to drive her to her COVID vaccine at around the same time. He agrees to come 09/10/19 instead. Scheduling message sent to make this change. Informed him that the CT is a baseline, but he needs to get started on tx soon per Dr. Benay Spice and tx few days prior to scan will not impact the result.

## 2019-09-09 ENCOUNTER — Ambulatory Visit: Payer: Medicare HMO | Admitting: Nurse Practitioner

## 2019-09-09 ENCOUNTER — Ambulatory Visit: Payer: Medicare HMO

## 2019-09-13 ENCOUNTER — Other Ambulatory Visit: Payer: Self-pay

## 2019-09-13 ENCOUNTER — Encounter (HOSPITAL_COMMUNITY): Payer: Self-pay

## 2019-09-13 ENCOUNTER — Ambulatory Visit (HOSPITAL_COMMUNITY)
Admission: RE | Admit: 2019-09-13 | Discharge: 2019-09-13 | Disposition: A | Discharge status: Home / Self Care | Payer: Medicare HMO | Source: Ambulatory Visit | Attending: Oncology | Admitting: Oncology

## 2019-09-13 DIAGNOSIS — C20 Malignant neoplasm of rectum: Secondary | ICD-10-CM

## 2019-09-13 DIAGNOSIS — C787 Secondary malignant neoplasm of liver and intrahepatic bile duct: Secondary | ICD-10-CM | POA: Diagnosis not present

## 2019-09-13 MED ORDER — IOHEXOL 300 MG/ML  SOLN
100.0000 mL | Freq: Once | INTRAMUSCULAR | Status: AC | PRN
  Administered 2019-09-13: 08:00:00 100 mL via INTRAVENOUS

## 2019-09-13 MED ORDER — SODIUM CHLORIDE (PF) 0.9 % IJ SOLN
INTRAMUSCULAR | Status: AC
  Filled 2019-09-13: qty 50

## 2019-09-13 NOTE — Progress Notes (Signed)
Pharmacist Chemotherapy Monitoring - Initial Assessment    Anticipated start date: 09/17/2019   Regimen:  . Are orders appropriate based on the patient's diagnosis, regimen, and cycle? Yes . Does the plan date match the patient's scheduled date? Yes . Is the sequencing of drugs appropriate? Yes . Are the premedications appropriate for the patient's regimen? Yes . Prior Authorization for treatment is: Approved o If applicable, is the correct biosimilar selected based on the patient's insurance? not applicable  Organ Function and Labs: Marland Kitchen Are dose adjustments needed based on the patient's renal function, hepatic function, or hematologic function? Yes . Are appropriate labs ordered prior to the start of patient's treatment? Yes . Other organ system assessment, if indicated: N/A . The following baseline labs, if indicated, have been ordered: pembrolizumab: baseline TSH +/- T4  Dose Assessment: . Are the drug doses appropriate? Yes . Are the following correct: o Drug concentrations Yes o IV fluid compatible with drug Yes o Administration routes Yes o Timing of therapy Yes . If applicable, does the patient have documented access for treatment and/or plans for port-a-cath placement? yes . If applicable, have lifetime cumulative doses been properly documented and assessed? not applicable Lifetime Dose Tracking  . Oxaliplatin: 1,049.182 mg/m2 (2,110 mg) = 174.86 % of the maximum lifetime dose of 600 mg/m2  o   Toxicity Monitoring/Prevention: . The patient has the following take home antiemetics prescribed: N/A . The patient has the following take home medications prescribed: N/A . Medication allergies and previous infusion related reactions, if applicable, have been reviewed and addressed. No . The patient's current medication list has been assessed for drug-drug interactions with their chemotherapy regimen. no significant drug-drug interactions were identified on review.  Order  Review: . Are the treatment plan orders signed? Yes . Is the patient scheduled to see a provider prior to their treatment? Yes  I verify that I have reviewed each item in the above checklist and answered each question accordingly.  Willodean Leven D 09/13/2019 2:44 PM

## 2019-09-16 ENCOUNTER — Telehealth: Payer: Self-pay | Admitting: Oncology

## 2019-09-16 NOTE — Telephone Encounter (Signed)
Scheduled appt per 4/15 sch message- lvm for pt

## 2019-09-17 ENCOUNTER — Inpatient Hospital Stay: Payer: Medicare HMO

## 2019-09-17 ENCOUNTER — Other Ambulatory Visit: Payer: Self-pay

## 2019-09-17 ENCOUNTER — Encounter: Payer: Self-pay | Admitting: Internal Medicine

## 2019-09-17 ENCOUNTER — Inpatient Hospital Stay: Payer: Medicare HMO | Attending: Nurse Practitioner | Admitting: Oncology

## 2019-09-17 ENCOUNTER — Ambulatory Visit (INDEPENDENT_AMBULATORY_CARE_PROVIDER_SITE_OTHER): Payer: Medicare HMO | Admitting: Internal Medicine

## 2019-09-17 VITALS — BP 125/89 | HR 73 | Temp 98.5°F | Resp 18 | Ht 73.0 in | Wt 163.5 lb

## 2019-09-17 DIAGNOSIS — Z803 Family history of malignant neoplasm of breast: Secondary | ICD-10-CM | POA: Diagnosis not present

## 2019-09-17 DIAGNOSIS — Z86010 Personal history of colonic polyps: Secondary | ICD-10-CM | POA: Diagnosis not present

## 2019-09-17 DIAGNOSIS — Z79899 Other long term (current) drug therapy: Secondary | ICD-10-CM | POA: Insufficient documentation

## 2019-09-17 DIAGNOSIS — H6122 Impacted cerumen, left ear: Secondary | ICD-10-CM | POA: Diagnosis not present

## 2019-09-17 DIAGNOSIS — I10 Essential (primary) hypertension: Secondary | ICD-10-CM | POA: Diagnosis not present

## 2019-09-17 DIAGNOSIS — R110 Nausea: Secondary | ICD-10-CM | POA: Insufficient documentation

## 2019-09-17 DIAGNOSIS — Z8042 Family history of malignant neoplasm of prostate: Secondary | ICD-10-CM | POA: Diagnosis not present

## 2019-09-17 DIAGNOSIS — C7800 Secondary malignant neoplasm of unspecified lung: Secondary | ICD-10-CM | POA: Insufficient documentation

## 2019-09-17 DIAGNOSIS — T451X5A Adverse effect of antineoplastic and immunosuppressive drugs, initial encounter: Secondary | ICD-10-CM | POA: Diagnosis not present

## 2019-09-17 DIAGNOSIS — Z923 Personal history of irradiation: Secondary | ICD-10-CM | POA: Diagnosis not present

## 2019-09-17 DIAGNOSIS — D701 Agranulocytosis secondary to cancer chemotherapy: Secondary | ICD-10-CM | POA: Insufficient documentation

## 2019-09-17 DIAGNOSIS — H6120 Impacted cerumen, unspecified ear: Secondary | ICD-10-CM | POA: Insufficient documentation

## 2019-09-17 DIAGNOSIS — G620 Drug-induced polyneuropathy: Secondary | ICD-10-CM | POA: Insufficient documentation

## 2019-09-17 DIAGNOSIS — C20 Malignant neoplasm of rectum: Secondary | ICD-10-CM

## 2019-09-17 DIAGNOSIS — R21 Rash and other nonspecific skin eruption: Secondary | ICD-10-CM | POA: Diagnosis not present

## 2019-09-17 DIAGNOSIS — R531 Weakness: Secondary | ICD-10-CM | POA: Diagnosis not present

## 2019-09-17 DIAGNOSIS — Z95828 Presence of other vascular implants and grafts: Secondary | ICD-10-CM

## 2019-09-17 DIAGNOSIS — Z5112 Encounter for antineoplastic immunotherapy: Secondary | ICD-10-CM | POA: Diagnosis not present

## 2019-09-17 DIAGNOSIS — R6881 Early satiety: Secondary | ICD-10-CM | POA: Diagnosis not present

## 2019-09-17 DIAGNOSIS — C787 Secondary malignant neoplasm of liver and intrahepatic bile duct: Secondary | ICD-10-CM | POA: Insufficient documentation

## 2019-09-17 LAB — CBC WITH DIFFERENTIAL (CANCER CENTER ONLY)
Abs Immature Granulocytes: 0.03 10*3/uL (ref 0.00–0.07)
Basophils Absolute: 0.1 10*3/uL (ref 0.0–0.1)
Basophils Relative: 1 %
Eosinophils Absolute: 0.2 10*3/uL (ref 0.0–0.5)
Eosinophils Relative: 2 %
HCT: 31.2 % — ABNORMAL LOW (ref 39.0–52.0)
Hemoglobin: 9.6 g/dL — ABNORMAL LOW (ref 13.0–17.0)
Immature Granulocytes: 0 %
Lymphocytes Relative: 14 %
Lymphs Abs: 1.6 10*3/uL (ref 0.7–4.0)
MCH: 29.4 pg (ref 26.0–34.0)
MCHC: 30.8 g/dL (ref 30.0–36.0)
MCV: 95.7 fL (ref 80.0–100.0)
Monocytes Absolute: 0.7 10*3/uL (ref 0.1–1.0)
Monocytes Relative: 6 %
Neutro Abs: 8.6 10*3/uL — ABNORMAL HIGH (ref 1.7–7.7)
Neutrophils Relative %: 77 %
Platelet Count: 309 10*3/uL (ref 150–400)
RBC: 3.26 MIL/uL — ABNORMAL LOW (ref 4.22–5.81)
RDW: 18.3 % — ABNORMAL HIGH (ref 11.5–15.5)
WBC Count: 11.2 10*3/uL — ABNORMAL HIGH (ref 4.0–10.5)
nRBC: 0 % (ref 0.0–0.2)

## 2019-09-17 LAB — CMP (CANCER CENTER ONLY)
ALT: 14 U/L (ref 0–44)
AST: 31 U/L (ref 15–41)
Albumin: 3.1 g/dL — ABNORMAL LOW (ref 3.5–5.0)
Alkaline Phosphatase: 125 U/L (ref 38–126)
Anion gap: 10 (ref 5–15)
BUN: 10 mg/dL (ref 8–23)
CO2: 23 mmol/L (ref 22–32)
Calcium: 9 mg/dL (ref 8.9–10.3)
Chloride: 103 mmol/L (ref 98–111)
Creatinine: 1.1 mg/dL (ref 0.61–1.24)
GFR, Est AFR Am: >60 mL/min (ref >60)
GFR, Estimated: >60 mL/min (ref >60)
Glucose, Bld: 124 mg/dL — ABNORMAL HIGH (ref 70–99)
Potassium: 4 mmol/L (ref 3.5–5.1)
Sodium: 136 mmol/L (ref 135–145)
Total Bilirubin: 0.5 mg/dL (ref 0.3–1.2)
Total Protein: 8.5 g/dL — ABNORMAL HIGH (ref 6.5–8.1)

## 2019-09-17 LAB — TSH: TSH: 0.839 u[IU]/mL (ref 0.320–4.118)

## 2019-09-17 MED ORDER — HEPARIN SOD (PORK) LOCK FLUSH 100 UNIT/ML IV SOLN
500.0000 [IU] | Freq: Once | INTRAVENOUS | Status: AC | PRN
  Administered 2019-09-17: 10:00:00 500 [IU]
  Filled 2019-09-17: qty 5

## 2019-09-17 MED ORDER — SODIUM CHLORIDE 0.9% FLUSH
10.0000 mL | INTRAVENOUS | Status: DC | PRN
  Administered 2019-09-17: 10:00:00 10 mL
  Filled 2019-09-17: qty 10

## 2019-09-17 MED ORDER — SODIUM CHLORIDE 0.9% FLUSH
10.0000 mL | Freq: Once | INTRAVENOUS | Status: AC
  Administered 2019-09-17: 10 mL
  Filled 2019-09-17: qty 10

## 2019-09-17 MED ORDER — SODIUM CHLORIDE 0.9 % IV SOLN
200.0000 mg | Freq: Once | INTRAVENOUS | Status: AC
  Administered 2019-09-17: 200 mg via INTRAVENOUS
  Filled 2019-09-17: qty 8

## 2019-09-17 MED ORDER — SODIUM CHLORIDE 0.9 % IV SOLN
Freq: Once | INTRAVENOUS | Status: AC
  Filled 2019-09-17: qty 250

## 2019-09-17 NOTE — Patient Instructions (Signed)
[facility] Discharge Instructions for Patients Receiving Chemotherapy  Today you received the following chemotherapy agents keytruda.  To help prevent nausea and vomiting after your treatment, we encourage you to take your nausea medication.   If you develop nausea and vomiting that is not controlled by your nausea medication, call the clinic.   BELOW ARE SYMPTOMS THAT SHOULD BE REPORTED IMMEDIATELY:  *FEVER GREATER THAN 100.5 F  *CHILLS WITH OR WITHOUT FEVER  NAUSEA AND VOMITING THAT IS NOT CONTROLLED WITH YOUR NAUSEA MEDICATION  *UNUSUAL SHORTNESS OF BREATH  *UNUSUAL BRUISING OR BLEEDING  TENDERNESS IN MOUTH AND THROAT WITH OR WITHOUT PRESENCE OF ULCERS  *URINARY PROBLEMS  *BOWEL PROBLEMS  UNUSUAL RASH Items with * indicate a potential emergency and should be followed up as soon as possible.  Feel free to call the clinic should you have any questions or concerns. The clinic phone number is [phone].  Please show the [facility] at check-in to the Emergency Department and triage nurse.  Pembrolizumab injection What is this medicine? PEMBROLIZUMAB (pem broe liz ue mab) is a monoclonal antibody. It is used to treat certain types of cancer. This medicine may be used for other purposes; ask your health care provider or pharmacist if you have questions. COMMON BRAND NAME(S): Keytruda What should I tell my health care provider before I take this medicine? They need to know if you have any of these conditions:  diabetes  immune system problems  inflammatory bowel disease  liver disease  lung or breathing disease  lupus  received or scheduled to receive an organ transplant or a stem-cell transplant that uses donor stem cells  an unusual or allergic reaction to pembrolizumab, other medicines, foods, dyes, or preservatives  pregnant or trying to get pregnant  breast-feeding How should I use this medicine? This medicine is for infusion  into a vein. It is given by a health care professional in a hospital or clinic setting. A special MedGuide will be given to you before each treatment. Be sure to read this information carefully each time. Talk to your pediatrician regarding the use of this medicine in children. While this drug may be prescribed for children as young as 6 months for selected conditions, precautions do apply. Overdosage: If you think you have taken too much of this medicine contact a poison control center or emergency room at once. NOTE: This medicine is only for you. Do not share this medicine with others. What if I miss a dose? It is important not to miss your dose. Call your doctor or health care professional if you are unable to keep an appointment. What may interact with this medicine? Interactions have not been studied. Give your health care provider a list of all the medicines, herbs, non-prescription drugs, or dietary supplements you use. Also tell them if you smoke, drink alcohol, or use illegal drugs. Some items may interact with your medicine. This list may not describe all possible interactions. Give your health care provider a list of all the medicines, herbs, non-prescription drugs, or dietary supplements you use. Also tell them if you smoke, drink alcohol, or use illegal drugs. Some items may interact with your medicine. What should I watch for while using this medicine? Your condition will be monitored carefully while you are receiving this medicine. You may need blood work done while you are taking this medicine. Do not become pregnant while taking this medicine or for 4 months after stopping it. Women should inform their  doctor if they wish to become pregnant or think they might be pregnant. There is a potential for serious side effects to an unborn child. Talk to your health care professional or pharmacist for more information. Do not breast-feed an infant while taking this medicine or for 4 months  after the last dose. What side effects may I notice from receiving this medicine? Side effects that you should report to your doctor or health care professional as soon as possible:  allergic reactions like skin rash, itching or hives, swelling of the face, lips, or tongue  bloody or black, tarry  breathing problems  changes in vision  chest pain  chills  confusion  constipation  cough  diarrhea  dizziness or feeling faint or lightheaded  fast or irregular heartbeat  fever  flushing  joint pain  low blood counts - this medicine may decrease the number of white blood cells, red blood cells and platelets. You may be at increased risk for infections and bleeding.  muscle pain  muscle weakness  pain, tingling, numbness in the hands or feet  persistent headache  redness, blistering, peeling or loosening of the skin, including inside the mouth  signs and symptoms of high blood sugar such as dizziness; dry mouth; dry skin; fruity breath; nausea; stomach pain; increased hunger or thirst; increased urination  signs and symptoms of kidney injury like trouble passing urine or change in the amount of urine  signs and symptoms of liver injury like dark urine, light-colored stools, loss of appetite, nausea, right upper belly pain, yellowing of the eyes or skin  sweating  swollen lymph nodes  weight loss Side effects that usually do not require medical attention (report to your doctor or health care professional if they continue or are bothersome):  decreased appetite  hair loss  muscle pain  tiredness This list may not describe all possible side effects. Call your doctor for medical advice about side effects. You may report side effects to FDA at 1-800-FDA-1088. Where should I keep my medicine? This drug is given in a hospital or clinic and will not be stored at home. NOTE: This sheet is a summary. It may not cover all possible information. If you have questions  about this medicine, talk to your doctor, pharmacist, or health care provider.  2020 Elsevier/Gold Standard (2019-03-26 18:07:58)

## 2019-09-17 NOTE — Assessment & Plan Note (Signed)
Wax removed from left ear during visit tolerated without complications. Hearing restored prior to leaving office.

## 2019-09-17 NOTE — Progress Notes (Signed)
First time [place]. Pt. Tolerated without issue. Nutrition provided.

## 2019-09-17 NOTE — Progress Notes (Signed)
   Subjective:   Patient ID: Hunter Walker, male    DOB: [birth year], 69 y.o.   MRN: [id number]  HPI The patient is a 69 YO man coming in for wax causing hearing loss. More left ear, right is fairly normal. He is having some fullness in the ear but no pain. Denies fevers or chills. No sinus problems. Has had both covid-19 vaccines. Tried water in the ear which did not help.   Review of Systems  Constitutional: Negative.   HENT: Positive for hearing loss.   Eyes: Negative.   Respiratory: Negative for cough, chest tightness and shortness of breath.   Cardiovascular: Negative for chest pain, palpitations and leg swelling.  Gastrointestinal: Negative for abdominal distention, abdominal pain, constipation, diarrhea, nausea and vomiting.  Musculoskeletal: Negative.   Skin: Negative.   Neurological: Negative.   Psychiatric/Behavioral: Negative.     Objective:  Physical Exam Constitutional:      Appearance: He is well-developed.  HENT:     Head: Normocephalic and atraumatic.     Comments:  left ear canal impacted with copious hard wax, examination post ear lavage canal is clear and no bleeding or complications noted.  Cardiovascular:     Rate and Rhythm: Normal rate and regular rhythm.  Pulmonary:     Effort: Pulmonary effort is normal. No respiratory distress.     Breath sounds: Normal breath sounds. No wheezing or rales.  Abdominal:     General: Bowel sounds are normal. There is no distension.     Palpations: Abdomen is soft.     Tenderness: There is no abdominal tenderness. There is no rebound.  Musculoskeletal:     Cervical back: Normal range of motion.  Skin:    General: Skin is warm and dry.  Neurological:     Mental Status: He is alert and oriented to person, place, and time.     Coordination: Coordination normal.     Vitals:   09/17/19 1508  BP: 114/72  Pulse: 78  Temp: 98.4 F (36.9 C)  SpO2: 100%  Weight: 165 lb (74.8 kg)  Height: 6\' 1"  (1.854 m)    This  visit occurred during the SARS-CoV-2 public health emergency.  Safety protocols were in place, including screening questions prior to the visit, additional usage of staff PPE, and extensive cleaning of exam room while observing appropriate contact time as indicated for disinfecting solutions.   Assessment & Plan:

## 2019-09-17 NOTE — Progress Notes (Signed)
Hunter Walker   Diagnosis: Rectal cancer  INTERVAL HISTORY:   Hunter Walker returns as scheduled.  He has pain at the rectum.  This chiefly occurs in the evening when he has a bowel movement.  He reports early satiety.  Objective:  Vital signs in last 24 hours:  Blood pressure 125/89, pulse 73, temperature 98.5 F (36.9 C), temperature source Oral, resp. rate 18, height '6\' 1"'  (1.854 m), weight 163 lb 8 oz (74.2 kg), SpO2 100 %.    GI: The liver edge is palpable in the medial right subcostal area, nontender Vascular: No leg edema    Portacath/PICC-without erythema  Lab Results:  Lab Results  Component Value Date   WBC 11.2 (H) 09/17/2019   HGB 9.6 (L) 09/17/2019   HCT 31.2 (L) 09/17/2019   MCV 95.7 09/17/2019   PLT 309 09/17/2019   NEUTROABS 8.6 (H) 09/17/2019    CMP  Lab Results  Component Value Date   NA 136 09/17/2019   K 4.0 09/17/2019   CL 103 09/17/2019   CO2 23 09/17/2019   GLUCOSE 124 (H) 09/17/2019   BUN 10 09/17/2019   CREATININE 1.10 09/17/2019   CALCIUM 9.0 09/17/2019   PROT 8.5 (H) 09/17/2019   ALBUMIN 3.1 (L) 09/17/2019   AST 31 09/17/2019   ALT 14 09/17/2019   ALKPHOS 125 09/17/2019   BILITOT 0.5 09/17/2019   GFRNONAA >60 09/17/2019   GFRAA >60 09/17/2019    Lab Results  Component Value Date   CEA1 361.10 (H) 08/30/2019    Lab Results  Component Value Date   INR 1.1 06/22/2019    Imaging:  CT images 09/13/2019-reviewed Medications: I have reviewed the patient's current medications.   Assessment/Plan: .  Rectal cancer, invasive adenocarcinoma confirmed at colonoscopy 05/22/2015, EUS staging 06/01/2015 revealed a uT3,N1 lesion at 7 cm from the anal verge  Staging CT abdomen/pelvis 05/15/2015 confirmed a mass at the rectosigmoid colon, a suspicious perirectal lymph node, and a 6 mm right liver lesion felt to most likely be a cyst  CT chest 05/30/2015 with indeterminate pulmonary nodules and  multiple liver lesions suspicious for metastases  Ultrasound 06/13/2014 with no liver lesions identified  MRI abdomen 06/21/2015 consistent with multiple liver metastases  CT biopsy of liver lesion 06/26/2015. Pathology showed metastatic adenocarcinoma consistent with colonic primary.MSI-stable, low mutation burden, no RAS or BRAFmutation  Cycle 1 FOLFOX 07/10/2015  Cycle 2 FOLFOX plus Avastin 07/24/2015 with Neulasta support  Cycle 3 FOLFOX plus Avastin 08/07/2015  Cycle 4 FOLFOX plus Avastin 08/21/2015  cycle 5 FOLFOX plus Avastin 09/04/2015  Restaging CTs 09/15/2015 revealed a decrease in the size of liver lesions, decreased rectal primary, stable/decreased size of tiny lung nodules  Cycle 6 FOLFOX plus Avastin 09/18/2015  Cycle 7 FOLFOX plus Avastin 10/02/2015  Cycle 8 FOLFOX plus Avastin 10/16/2015  Cycle 9 FOLFOX plus Avastin 11/06/2015  Cycle 10 FOLFOX plus Avastin 11/20/2015  CTs 12/07/2015-mild decrease in wall thickening at the rectum, decrease in tiny hepatic metastases, stable lung nodules, no evidence of progressive metastatic disease, new left lower lobe infiltrate  Treatment changed to every three-week Avastin with every other week Xeloda beginning 12/11/2015  Restaging CTs 04/12/2016-interval increase in size of adjacent right upper lobe pulmonary nodules and right lower lobe pulmonary nodule; unchanged 5 mm lesion right hepatic lobe; no additional visualized hepatic metastasis. Grossly unchanged wall thickening of the rectum.  Continuation of Xeloda every other week and every three-week AvastinXeloda placed on hold 05/06/2016 due  to hand-foot syndrome  Xeloda resumed 05/28/2016 1500 mg every morning and 1000 mg every afternoon 7 days on/7 days off  CT 09/27/2016-enlargement of lung nodules and new liver lesions, stable rectal mass  Cycle 1 FOLFIRI/panitumumab 10/08/2016  Cycle 2 panitumumab 10/23/2016 (FOLFIRI held secondary to neutropenia)  Cycle 3  FOLFIRI/PANITUMUMAB 11/04/2016  Cycle 4 FOLFIRI/panitumumab 11/18/2016  Cycle 5 FOLFIRI/panitumumab 12/16/2016  Cycle 6 FOLFIRI/panitumumab 12/31/2016  CTs 01/16/2017-decreased size of pulmonary nodules and liver lesions. Decreased perirectal lymph node  Cycle 7 FOLFIRI/PANITUMUMAB 01/28/2017  Cycle 8FOLFIRI/PANITUMUMAB 02/10/2017  Cycle 9 FOLFIRI/PANITUMUMAB 02/24/2017  Cycle 10 FOLFIRI/panitumumab 03/10/2017 (irinotecan held secondary to neutropenia)  Cycle 11 FOLFIRI/panitumumab 03/24/2017  Cycle12 FOLFIRI/Panitumumab 04/07/2017  Restaging CTs 04/25/2017-stable rectal mass with adjacent 8 mm left perirectal nodal metastasis. Prior hepatic metastases not visualized on the current study. Indeterminate 16 mm enhancing lesion present in segment 6, indeterminate. Minimal residual nodularity in the right upper lobe. No new/progressive pulmonary metastases.  Maintenance Xeloda/panitumumab 04/28/2017  Restaging CTs 08/18/2017-new and enlarging bilateral lung nodules; 2 hyperattenuating lesions in the liver appear similar to prior study; progression of wall thickening in the rectum with enlarging perirectal lymph nodes.  Initiation of radiation/Xeloda 09/01/2017, completed 09/22/2017  Cycle 1 FOLFIRI/Panitumumab 10/13/2017  Cycle 2 FOLFIRI 11/03/2017  Cycle 3 FOLFIRI 11/17/2017  Cycle 4 FOLFIRI 12/01/2017  Cycle 5 FOLFIRI 12/15/2017  CTs7/26/2019-mild progression lungs and liver; significantly improved rectal wall thickening; resolution of perirectal nodal adenopathy.  Cycle 6 FOLFIRI 12/29/2017  Cycle 7 FOLFIRI 01/12/2018  Cycle 8 FOLFIRI 01/26/2018  Cycle 9 FOLFIRI 02/09/2018  Cycle 10 FOLFIRI 02/23/2018  Restaging CTs 03/06/2018-majority of the pulmonary nodules are similar in size, at least 2 of the nodules are minimally increased in size. Similar appearing lesions within the liver. Similar-appearing mild wall thickening of the rectum.  Cycle 11FOLFIRI 03/09/2018  Cycle  12 FOLFIRI 03/23/2018  Cycle 13 FOLFIRI 04/06/2018  Cycle 14 FOLFIRI 04/20/2018  Cycle 15 FOLFIRI 05/04/2018  CT 05/15/2018-no change in pulmonary nodules, mild increase in size of 2 liver lesions, stable rectal wall thickening  On study at [facility], [place] with palbociclib andUlixertinibbeginning 07/15/2018  [facility]  Restaging CTs 10/07/2018-enlargement of multiple lateral pulmonary nodules and liver lesions; redemonstrated circumferential thickening of the mid rectum with interval enlargement of an irregular nodule of the left perirectal fat.  Cycle 1 FOLFOX 10/12/2018  Cycle 2 FOLFOX 11/02/2018 (allergic reaction to oxaliplatin, oxaliplatin discontinued)  Guardant testing 11/30/2018-KRAS Q61H,EGFR amplification, MSI high not detected(Walker:Plan for repeat guardant testing at a 37-monthinterval)  Cycle 1 Lonsurf 01/11/2019  Cycle 2 Lonsurf 02/08/2019, dose reduced  Every 2-week Avastin beginning 02/09/2019  Cycle 3 Lonsurf 03/08/2019  Cycle 4 Lonsurf 04/12/2019  CTs 05/07/2019-significant disease progression in the liver, minimal increase in pulmonary metastatic disease, increase in left perirectal nodularity, persistent wall thickening in the lower rectum  Repeat guardant 360 testing 06/03/2019-K-rasQ61Halteration, tumor mutation burden 64, MSI-high not detected  Cycle 1 FOLFIRI 06/14/2019  Liver biopsy 06/22/2019-metastatic adenocarcinoma, Foundation 1- [id number] TMB 13, MSS EGFR amplification  Cycle 2 FOLFIRI 07/05/2019  Cycle 3 FOLFIRI 07/19/2019  Cycle 4 FOLFIRI 08/02/2019  CT 09/13/2019-increased size of lung and liver metastases, increased rectal wall thickening, progressive left perirectal nodal metastasis  Cycle 1 pembrolizumab 09/17/2019 2.Multiple colon polyps on the colonoscopy 05/22/2015, tubular villous adenoma and a tubular adenoma were removed 3.Anemia-likely secondary to rectal bleeding.  4.Family history of multiple cancers including breast  and prostate cancer 5.Transient right arm numbness 06/20/2015 6. Port-A-Cath placement 07/06/2015 by Dr.  Thomas 7. Mild neutropenia secondary to chemotherapy following cycle 1 FOLFOX. Neulasta added with cycle 2. 8. Delayed nausea following chemotherapy-emend added with cycle 3 FOLFOX;Emend added with cycle 12 FOLFIRI. 9. Early oxaliplatin neuropathy 10. 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 Xeloda resumed at a reduced dose. 11. Bilateral hand weakness. Question related to previous oxaliplatin. Referred to neurology 06/17/2016. 12. Hypertension-amlodipine started 07/08/2016 13. Rash secondary to PANITUMUMAB.  14. Neutropenia following cycle 1 FOLFIRI/PANITUMUMAB; neutropenia following cycle 4 FOLFIRI/PANITUMUMAB. 15.Neutropenia secondary to Lonsurf-cycle 4 of Lonsurf delayed until 04/12/2019     Disposition: Mr. Luft has metastatic rectal cancer.  The restaging CT confirms disease progression.  He will begin treatment with pembrolizumab today.  We reviewed potential toxicities associated with pembrolizumab again today.  He agrees to proceed.  He will continue oxycodone as needed for pain.  Mr. Millirons will return for an office and lab visit in 3 weeks.  I discussed the CT findings with him.  I reviewed the CT images.  Betsy Coder, MD  09/17/2019  2:57 PM

## 2019-09-20 ENCOUNTER — Ambulatory Visit: Payer: Medicare HMO | Admitting: Internal Medicine

## 2019-09-20 NOTE — Addendum Note (Signed)
Addended by: Otilio Miu on: 09/20/2019 10:35 AM   Modules accepted: Orders

## 2019-09-20 NOTE — Progress Notes (Signed)
Wax removal in left ear. Patient tolerated well

## 2019-09-24 ENCOUNTER — Other Ambulatory Visit: Payer: Self-pay | Admitting: Nurse Practitioner

## 2019-09-24 DIAGNOSIS — C20 Malignant neoplasm of rectum: Secondary | ICD-10-CM

## 2019-09-24 MED ORDER — OXYCODONE-ACETAMINOPHEN 5-325 MG PO TABS: 1.0000 | ORAL_TABLET | ORAL | 0 refills | Status: DC | PRN

## 2019-09-28 ENCOUNTER — Other Ambulatory Visit: Payer: Self-pay | Admitting: *Deleted

## 2019-09-28 MED ORDER — BISACODYL 5 MG PO TBEC: 5.0000 mg | DELAYED_RELEASE_TABLET | Freq: Every day | ORAL | 5 refills | Status: AC | PRN

## 2019-09-28 NOTE — Progress Notes (Signed)
Received refill request for Dulcolax 5 mg tablets from [facility]. Refill approved.

## 2019-10-05 NOTE — Progress Notes (Signed)
Pharmacist Chemotherapy Monitoring - Follow Up Assessment    I verify that I have reviewed each item in the below checklist:  . Regimen for the patient is scheduled for the appropriate day and plan matches scheduled date. Marland Kitchen Appropriate non-routine labs are ordered dependent on drug ordered. . If applicable, additional medications reviewed and ordered per protocol based on lifetime cumulative doses and/or treatment regimen.   Plan for follow-up and/or issues identified: No . I-vent associated with next due treatment: No . MD and/or nursing notified: No  Hunter Walker 10/05/2019 2:26 PM

## 2019-10-10 ENCOUNTER — Other Ambulatory Visit: Payer: Self-pay | Admitting: Oncology

## 2019-10-11 ENCOUNTER — Inpatient Hospital Stay: Payer: Medicare HMO

## 2019-10-11 ENCOUNTER — Other Ambulatory Visit: Payer: Self-pay

## 2019-10-11 ENCOUNTER — Inpatient Hospital Stay: Payer: Medicare HMO | Attending: Nurse Practitioner | Admitting: Nurse Practitioner

## 2019-10-11 ENCOUNTER — Encounter: Payer: Self-pay | Admitting: Nurse Practitioner

## 2019-10-11 VITALS — BP 117/83 | HR 95 | Temp 98.5°F | Resp 18 | Ht 73.0 in | Wt 158.7 lb

## 2019-10-11 DIAGNOSIS — C20 Malignant neoplasm of rectum: Secondary | ICD-10-CM | POA: Diagnosis not present

## 2019-10-11 DIAGNOSIS — Z803 Family history of malignant neoplasm of breast: Secondary | ICD-10-CM | POA: Insufficient documentation

## 2019-10-11 DIAGNOSIS — R21 Rash and other nonspecific skin eruption: Secondary | ICD-10-CM | POA: Diagnosis not present

## 2019-10-11 DIAGNOSIS — R531 Weakness: Secondary | ICD-10-CM | POA: Diagnosis not present

## 2019-10-11 DIAGNOSIS — G620 Drug-induced polyneuropathy: Secondary | ICD-10-CM | POA: Diagnosis not present

## 2019-10-11 DIAGNOSIS — R918 Other nonspecific abnormal finding of lung field: Secondary | ICD-10-CM | POA: Insufficient documentation

## 2019-10-11 DIAGNOSIS — Z8042 Family history of malignant neoplasm of prostate: Secondary | ICD-10-CM | POA: Insufficient documentation

## 2019-10-11 DIAGNOSIS — D701 Agranulocytosis secondary to cancer chemotherapy: Secondary | ICD-10-CM | POA: Insufficient documentation

## 2019-10-11 DIAGNOSIS — I10 Essential (primary) hypertension: Secondary | ICD-10-CM | POA: Diagnosis not present

## 2019-10-11 DIAGNOSIS — Z79899 Other long term (current) drug therapy: Secondary | ICD-10-CM | POA: Insufficient documentation

## 2019-10-11 DIAGNOSIS — D649 Anemia, unspecified: Secondary | ICD-10-CM | POA: Insufficient documentation

## 2019-10-11 DIAGNOSIS — Z5112 Encounter for antineoplastic immunotherapy: Secondary | ICD-10-CM | POA: Diagnosis not present

## 2019-10-11 DIAGNOSIS — Z95828 Presence of other vascular implants and grafts: Secondary | ICD-10-CM

## 2019-10-11 DIAGNOSIS — T451X5A Adverse effect of antineoplastic and immunosuppressive drugs, initial encounter: Secondary | ICD-10-CM | POA: Insufficient documentation

## 2019-10-11 DIAGNOSIS — C787 Secondary malignant neoplasm of liver and intrahepatic bile duct: Secondary | ICD-10-CM | POA: Diagnosis not present

## 2019-10-11 LAB — CBC WITH DIFFERENTIAL (CANCER CENTER ONLY)
Abs Immature Granulocytes: 0.03 10*3/uL (ref 0.00–0.07)
Basophils Absolute: 0.1 10*3/uL (ref 0.0–0.1)
Basophils Relative: 1 %
Eosinophils Absolute: 0.1 10*3/uL (ref 0.0–0.5)
Eosinophils Relative: 1 %
HCT: 32.6 % — ABNORMAL LOW (ref 39.0–52.0)
Hemoglobin: 10.2 g/dL — ABNORMAL LOW (ref 13.0–17.0)
Immature Granulocytes: 0 %
Lymphocytes Relative: 16 %
Lymphs Abs: 1.7 10*3/uL (ref 0.7–4.0)
MCH: 29 pg (ref 26.0–34.0)
MCHC: 31.3 g/dL (ref 30.0–36.0)
MCV: 92.6 fL (ref 80.0–100.0)
Monocytes Absolute: 0.8 10*3/uL (ref 0.1–1.0)
Monocytes Relative: 7 %
Neutro Abs: 8.1 10*3/uL — ABNORMAL HIGH (ref 1.7–7.7)
Neutrophils Relative %: 75 %
Platelet Count: 319 10*3/uL (ref 150–400)
RBC: 3.52 MIL/uL — ABNORMAL LOW (ref 4.22–5.81)
RDW: 17.6 % — ABNORMAL HIGH (ref 11.5–15.5)
WBC Count: 10.7 10*3/uL — ABNORMAL HIGH (ref 4.0–10.5)
nRBC: 0 % (ref 0.0–0.2)

## 2019-10-11 LAB — CMP (CANCER CENTER ONLY)
ALT: 14 U/L (ref 0–44)
AST: 43 U/L — ABNORMAL HIGH (ref 15–41)
Albumin: 3.1 g/dL — ABNORMAL LOW (ref 3.5–5.0)
Alkaline Phosphatase: 160 U/L — ABNORMAL HIGH (ref 38–126)
Anion gap: 13 (ref 5–15)
BUN: 12 mg/dL (ref 8–23)
CO2: 22 mmol/L (ref 22–32)
Calcium: 9 mg/dL (ref 8.9–10.3)
Chloride: 101 mmol/L (ref 98–111)
Creatinine: 1.44 mg/dL — ABNORMAL HIGH (ref 0.61–1.24)
GFR, Est AFR Am: 58 mL/min — ABNORMAL LOW (ref >60)
GFR, Estimated: 50 mL/min — ABNORMAL LOW (ref >60)
Glucose, Bld: 122 mg/dL — ABNORMAL HIGH (ref 70–99)
Potassium: 4.5 mmol/L (ref 3.5–5.1)
Sodium: 136 mmol/L (ref 135–145)
Total Bilirubin: 0.6 mg/dL (ref 0.3–1.2)
Total Protein: 8.8 g/dL — ABNORMAL HIGH (ref 6.5–8.1)

## 2019-10-11 MED ORDER — SODIUM CHLORIDE 0.9 % IV SOLN
Freq: Once | INTRAVENOUS | Status: AC
  Filled 2019-10-11: qty 250

## 2019-10-11 MED ORDER — PROCHLORPERAZINE MALEATE 10 MG PO TABS: ORAL_TABLET | ORAL | 2 refills | Status: AC

## 2019-10-11 MED ORDER — HEPARIN SOD (PORK) LOCK FLUSH 100 UNIT/ML IV SOLN
500.0000 [IU] | Freq: Once | INTRAVENOUS | Status: AC | PRN
  Administered 2019-10-11: 500 [IU]
  Filled 2019-10-11: qty 5

## 2019-10-11 MED ORDER — SODIUM CHLORIDE 0.9% FLUSH
10.0000 mL | INTRAVENOUS | Status: DC | PRN
  Administered 2019-10-11: 10 mL
  Filled 2019-10-11: qty 10

## 2019-10-11 MED ORDER — SODIUM CHLORIDE 0.9% FLUSH
10.0000 mL | Freq: Once | INTRAVENOUS | Status: AC
  Administered 2019-10-11: 10 mL
  Filled 2019-10-11: qty 10

## 2019-10-11 MED ORDER — SODIUM CHLORIDE 0.9 % IV SOLN
200.0000 mg | Freq: Once | INTRAVENOUS | Status: AC
  Administered 2019-10-11: 200 mg via INTRAVENOUS
  Filled 2019-10-11: qty 8

## 2019-10-11 NOTE — Progress Notes (Signed)
[place] OFFICE PROGRESS NOTE   Diagnosis: Rectal cancer  INTERVAL HISTORY:   Hunter Walker returns as scheduled.  He completed cycle 1 pembrolizumab 09/17/2019.  He had diarrhea for a few days.  He took an antidiarrheal medication and the diarrhea resolved.  He is having intermittent nausea, periodic vomiting.  He ran out of his nausea medication.  No skin rash.  He has pain at the rectum with a bowel movement.  Intermittent pain at the low abdomen.  Objective:  Vital signs in last 24 hours:  Blood pressure 117/83, pulse 95, temperature 98.5 F (36.9 C), temperature source Temporal, resp. rate 18, height '6\' 1"'  (1.854 m), weight 158 lb 11.2 oz (72 kg), SpO2 98 %.    HEENT: No thrush or ulcers. Resp: Lungs clear bilaterally. Cardio: Regular rate and rhythm. GI: Abdomen soft.  Liver palpable medial subcostal area.  Nontender. Vascular: No leg edema. Neuro: Alert and oriented. Skin: Skin turgor intact. Port-A-Cath without erythema.  Lab Results:  Lab Results  Component Value Date   WBC 10.7 (H) 10/11/2019   HGB 10.2 (L) 10/11/2019   HCT 32.6 (L) 10/11/2019   MCV 92.6 10/11/2019   PLT 319 10/11/2019   NEUTROABS 8.1 (H) 10/11/2019    Imaging:  No results found.  Medications: I have reviewed the patient's current medications.  Assessment/Plan: .  Rectal cancer, invasive adenocarcinoma confirmed at colonoscopy 05/22/2015, EUS staging 06/01/2015 revealed a uT3,N1 lesion at 7 cm from the anal verge  Staging CT abdomen/pelvis 05/15/2015 confirmed a mass at the rectosigmoid colon, a suspicious perirectal lymph node, and a 6 mm right liver lesion felt to most likely be a cyst  CT chest 05/30/2015 with indeterminate pulmonary nodules and multiple liver lesions suspicious for metastases  Ultrasound 06/13/2014 with no liver lesions identified  MRI abdomen 06/21/2015 consistent with multiple liver metastases  CT biopsy of liver lesion 06/26/2015. Pathology  showed metastatic adenocarcinoma consistent with colonic primary.MSI-stable, low mutation burden, no RAS or BRAFmutation  Cycle 1 FOLFOX 07/10/2015  Cycle 2 FOLFOX plus Avastin 07/24/2015 with Neulasta support  Cycle 3 FOLFOX plus Avastin 08/07/2015  Cycle 4 FOLFOX plus Avastin 08/21/2015  cycle 5 FOLFOX plus Avastin 09/04/2015  Restaging CTs 09/15/2015 revealed a decrease in the size of liver lesions, decreased rectal primary, stable/decreased size of tiny lung nodules  Cycle 6 FOLFOX plus Avastin 09/18/2015  Cycle 7 FOLFOX plus Avastin 10/02/2015  Cycle 8 FOLFOX plus Avastin 10/16/2015  Cycle 9 FOLFOX plus Avastin 11/06/2015  Cycle 10 FOLFOX plus Avastin 11/20/2015  CTs 12/07/2015-mild decrease in wall thickening at the rectum, decrease in tiny hepatic metastases, stable lung nodules, no evidence of progressive metastatic disease, new left lower lobe infiltrate  Treatment changed to every three-week Avastin with every other week Xeloda beginning 12/11/2015  Restaging CTs 04/12/2016-interval increase in size of adjacent right upper lobe pulmonary nodules and right lower lobe pulmonary nodule; unchanged 5 mm lesion right hepatic lobe; no additional visualized hepatic metastasis. Grossly unchanged wall thickening of the rectum.  Continuation of Xeloda every other week and every three-week AvastinXeloda placed on hold 05/06/2016 due to hand-foot syndrome  Xeloda resumed 05/28/2016 1500 mg every morning and 1000 mg every afternoon 7 days on/7 days off  CT 09/27/2016-enlargement of lung nodules and new liver lesions, stable rectal mass  Cycle 1 FOLFIRI/panitumumab 10/08/2016  Cycle 2 panitumumab 10/23/2016 (FOLFIRI held secondary to neutropenia)  Cycle 3 FOLFIRI/PANITUMUMAB 11/04/2016  Cycle 4 FOLFIRI/panitumumab 11/18/2016  Cycle 5 FOLFIRI/panitumumab 12/16/2016  Cycle 6  FOLFIRI/panitumumab 12/31/2016  CTs 01/16/2017-decreased size of pulmonary nodules and liver  lesions. Decreased perirectal lymph node  Cycle 7 FOLFIRI/PANITUMUMAB 01/28/2017  Cycle 8FOLFIRI/PANITUMUMAB 02/10/2017  Cycle 9 FOLFIRI/PANITUMUMAB 02/24/2017  Cycle 10 FOLFIRI/panitumumab 03/10/2017 (irinotecan held secondary to neutropenia)  Cycle 11 FOLFIRI/panitumumab 03/24/2017  Cycle12 FOLFIRI/Panitumumab 04/07/2017  Restaging CTs 04/25/2017-stable rectal mass with adjacent 8 mm left perirectal nodal metastasis. Prior hepatic metastases not visualized on the current study. Indeterminate 16 mm enhancing lesion present in segment 6, indeterminate. Minimal residual nodularity in the right upper lobe. No new/progressive pulmonary metastases.  Maintenance Xeloda/panitumumab 04/28/2017  Restaging CTs 08/18/2017-new and enlarging bilateral lung nodules; 2 hyperattenuating lesions in the liver appear similar to prior study; progression of wall thickening in the rectum with enlarging perirectal lymph nodes.  Initiation of radiation/Xeloda 09/01/2017, completed 09/22/2017  Cycle 1 FOLFIRI/Panitumumab 10/13/2017  Cycle 2 FOLFIRI 11/03/2017  Cycle 3 FOLFIRI 11/17/2017  Cycle 4 FOLFIRI 12/01/2017  Cycle 5 FOLFIRI 12/15/2017  CTs7/26/2019-mild progression lungs and liver; significantly improved rectal wall thickening; resolution of perirectal nodal adenopathy.  Cycle 6 FOLFIRI 12/29/2017  Cycle 7 FOLFIRI 01/12/2018  Cycle 8 FOLFIRI 01/26/2018  Cycle 9 FOLFIRI 02/09/2018  Cycle 10 FOLFIRI 02/23/2018  Restaging CTs 03/06/2018-majority of the pulmonary nodules are similar in size, at least 2 of the nodules are minimally increased in size. Similar appearing lesions within the liver. Similar-appearing mild wall thickening of the rectum.  Cycle 11FOLFIRI 03/09/2018  Cycle 12 FOLFIRI 03/23/2018  Cycle 13 FOLFIRI 04/06/2018  Cycle 14 FOLFIRI 04/20/2018  Cycle 15 FOLFIRI 05/04/2018  CT 05/15/2018-no change in pulmonary nodules, mild increase in size of 2 liver lesions, stable rectal  wall thickening  On study at [facility], [place] with palbociclib andUlixertinibbeginning 07/15/2018  [facility]  Restaging CTs 10/07/2018-enlargement of multiple lateral pulmonary nodules and liver lesions; redemonstrated circumferential thickening of the mid rectum with interval enlargement of an irregular nodule of the left perirectal fat.  Cycle 1 FOLFOX 10/12/2018  Cycle 2 FOLFOX 11/02/2018 (allergic reaction to oxaliplatin, oxaliplatin discontinued)  Guardant testing 11/30/2018-KRAS Q61H,EGFR amplification, MSI high not detected(NOTE:Plan for repeat guardant testing at a 9-monthinterval)  Cycle 1 Lonsurf 01/11/2019  Cycle 2 Lonsurf 02/08/2019, dose reduced  Every 2-week Avastin beginning 02/09/2019  Cycle 3 Lonsurf 03/08/2019  Cycle 4 Lonsurf 04/12/2019  CTs 05/07/2019-significant disease progression in the liver, minimal increase in pulmonary metastatic disease, increase in left perirectal nodularity, persistent wall thickening in the lower rectum  Repeat guardant 360 testing 06/03/2019-K-rasQ61Halteration, tumor mutation burden 64, MSI-high not detected  Cycle 1 FOLFIRI 06/14/2019  Liver biopsy 06/22/2019-metastatic adenocarcinoma, Foundation 15-[id number] 13, MSS EGFRamplification  Cycle 2 FOLFIRI 07/05/2019  Cycle 3 FOLFIRI 07/19/2019  Cycle 4 FOLFIRI 08/02/2019  CT 09/13/2019-increased size of lung and liver metastases, increased rectal wall thickening, progressive left perirectal nodal metastasis  Cycle 1 pembrolizumab 09/17/2019  Cycle 2 pembrolizumab 10/11/2019 2.Multiple colon polyps on the colonoscopy 05/22/2015, tubular villous adenoma and a tubular adenoma were removed 3.Anemia-likely secondary to rectal bleeding.  4.Family history of multiple cancers including breast and prostate cancer 5.Transient right arm numbness 06/20/2015 6. Port-A-Cath placement 07/06/2015 by Dr. TMarcello Moores7. Mild neutropenia secondary to chemotherapy following cycle 1  FOLFOX. Neulasta added with cycle 2. 8. Delayed nausea following chemotherapy-emend added with cycle 3 FOLFOX;Emend added with cycle 12 FOLFIRI. 9. Early oxaliplatin neuropathy 10. 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 Xeloda resumed at a reduced dose. 11. Bilateral hand weakness. Question related to previous oxaliplatin. Referred to neurology 06/17/2016. 12. Hypertension-amlodipine  started 07/08/2016 13. Rash secondary to PANITUMUMAB.  14. Neutropenia following cycle 1 FOLFIRI/PANITUMUMAB; neutropenia following cycle 4 FOLFIRI/PANITUMUMAB. 15.Neutropenia secondary to Lonsurf-cycle 4 of Lonsurf delayed until 04/12/2019   Disposition: Hunter Walker appears stable.  He has completed 1 cycle of pembrolizumab.  Plan to proceed with cycle 2 today as scheduled.  We reviewed the CBC and chemistry panel from today.  Labs adequate to proceed.  Creatinine is slightly higher than baseline.  He may be mildly dehydrated.  He will increase fluid intake.  He will return for lab, follow-up, cycle 3 pembrolizumab in 3 weeks.  He will contact the office in the interim with any problems.  Plan reviewed with Dr. Benay Spice.      Ned Card ANP/GNP-BC   10/11/2019  10:20 AM

## 2019-10-11 NOTE — Patient Instructions (Signed)
[facility] Discharge Instructions for Patients Receiving Chemotherapy  Today you received the following chemotherapy agents keytruda.  To help prevent nausea and vomiting after your treatment, we encourage you to take your nausea medication.   If you develop nausea and vomiting that is not controlled by your nausea medication, call the clinic.   BELOW ARE SYMPTOMS THAT SHOULD BE REPORTED IMMEDIATELY:  *FEVER GREATER THAN 100.5 F  *CHILLS WITH OR WITHOUT FEVER  NAUSEA AND VOMITING THAT IS NOT CONTROLLED WITH YOUR NAUSEA MEDICATION  *UNUSUAL SHORTNESS OF BREATH  *UNUSUAL BRUISING OR BLEEDING  TENDERNESS IN MOUTH AND THROAT WITH OR WITHOUT PRESENCE OF ULCERS  *URINARY PROBLEMS  *BOWEL PROBLEMS  UNUSUAL RASH Items with * indicate a potential emergency and should be followed up as soon as possible.  Feel free to call the clinic should you have any questions or concerns. The clinic phone number is [phone].  Please show the [facility] at check-in to the Emergency Department and triage nurse.  Pembrolizumab injection What is this medicine? PEMBROLIZUMAB (pem broe liz ue mab) is a monoclonal antibody. It is used to treat certain types of cancer. This medicine may be used for other purposes; ask your health care provider or pharmacist if you have questions. COMMON BRAND NAME(S): Keytruda What should I tell my health care provider before I take this medicine? They need to know if you have any of these conditions:  diabetes  immune system problems  inflammatory bowel disease  liver disease  lung or breathing disease  lupus  received or scheduled to receive an organ transplant or a stem-cell transplant that uses donor stem cells  an unusual or allergic reaction to pembrolizumab, other medicines, foods, dyes, or preservatives  pregnant or trying to get pregnant  breast-feeding How should I use this medicine? This medicine is for infusion  into a vein. It is given by a health care professional in a hospital or clinic setting. A special MedGuide will be given to you before each treatment. Be sure to read this information carefully each time. Talk to your pediatrician regarding the use of this medicine in children. While this drug may be prescribed for children as young as 6 months for selected conditions, precautions do apply. Overdosage: If you think you have taken too much of this medicine contact a poison control center or emergency room at once. NOTE: This medicine is only for you. Do not share this medicine with others. What if I miss a dose? It is important not to miss your dose. Call your doctor or health care professional if you are unable to keep an appointment. What may interact with this medicine? Interactions have not been studied. Give your health care provider a list of all the medicines, herbs, non-prescription drugs, or dietary supplements you use. Also tell them if you smoke, drink alcohol, or use illegal drugs. Some items may interact with your medicine. This list may not describe all possible interactions. Give your health care provider a list of all the medicines, herbs, non-prescription drugs, or dietary supplements you use. Also tell them if you smoke, drink alcohol, or use illegal drugs. Some items may interact with your medicine. What should I watch for while using this medicine? Your condition will be monitored carefully while you are receiving this medicine. You may need blood work done while you are taking this medicine. Do not become pregnant while taking this medicine or for 4 months after stopping it. Women should inform their  doctor if they wish to become pregnant or think they might be pregnant. There is a potential for serious side effects to an unborn child. Talk to your health care professional or pharmacist for more information. Do not breast-feed an infant while taking this medicine or for 4 months  after the last dose. What side effects may I notice from receiving this medicine? Side effects that you should report to your doctor or health care professional as soon as possible:  allergic reactions like skin rash, itching or hives, swelling of the face, lips, or tongue  bloody or black, tarry  breathing problems  changes in vision  chest pain  chills  confusion  constipation  cough  diarrhea  dizziness or feeling faint or lightheaded  fast or irregular heartbeat  fever  flushing  joint pain  low blood counts - this medicine may decrease the number of white blood cells, red blood cells and platelets. You may be at increased risk for infections and bleeding.  muscle pain  muscle weakness  pain, tingling, numbness in the hands or feet  persistent headache  redness, blistering, peeling or loosening of the skin, including inside the mouth  signs and symptoms of high blood sugar such as dizziness; dry mouth; dry skin; fruity breath; nausea; stomach pain; increased hunger or thirst; increased urination  signs and symptoms of kidney injury like trouble passing urine or change in the amount of urine  signs and symptoms of liver injury like dark urine, light-colored stools, loss of appetite, nausea, right upper belly pain, yellowing of the eyes or skin  sweating  swollen lymph nodes  weight loss Side effects that usually do not require medical attention (report to your doctor or health care professional if they continue or are bothersome):  decreased appetite  hair loss  muscle pain  tiredness This list may not describe all possible side effects. Call your doctor for medical advice about side effects. You may report side effects to FDA at 1-800-FDA-1088. Where should I keep my medicine? This drug is given in a hospital or clinic and will not be stored at home. NOTE: This sheet is a summary. It may not cover all possible information. If you have questions  about this medicine, talk to your doctor, pharmacist, or health care provider.  2020 Elsevier/Gold Standard (2019-03-26 18:07:58)

## 2019-10-11 NOTE — Patient Instructions (Signed)

## 2019-10-22 ENCOUNTER — Other Ambulatory Visit: Payer: Self-pay | Admitting: Nurse Practitioner

## 2019-10-22 ENCOUNTER — Telehealth: Payer: Self-pay | Admitting: *Deleted

## 2019-10-22 DIAGNOSIS — C20 Malignant neoplasm of rectum: Secondary | ICD-10-CM

## 2019-10-22 MED ORDER — OXYCODONE-ACETAMINOPHEN 5-325 MG PO TABS: 1.0000 | ORAL_TABLET | ORAL | 0 refills | Status: DC | PRN

## 2019-10-22 NOTE — Telephone Encounter (Signed)
Requesting refill on his oxycodone/apap. Also wanted MD aware of feeing much more tired as well as his pain is worsening in intensity now. Having more gas and epigastric pain. Confirmed he is taking the pantoprazole 40 mg daily.

## 2019-10-26 NOTE — Progress Notes (Signed)
Pharmacist Chemotherapy Monitoring - Follow Up Assessment    I verify that I have reviewed each item in the below checklist:  . Regimen for the patient is scheduled for the appropriate day and plan matches scheduled date. Marland Kitchen Appropriate non-routine labs are ordered dependent on drug ordered. . If applicable, additional medications reviewed and ordered per protocol based on lifetime cumulative doses and/or treatment regimen.   Plan for follow-up and/or issues identified: No . I-vent associated with next due treatment: No . MD and/or nursing notified: No  Saydi Kobel K 10/26/2019 3:46 PM

## 2019-11-01 ENCOUNTER — Other Ambulatory Visit: Payer: Self-pay | Admitting: Oncology

## 2019-11-02 ENCOUNTER — Inpatient Hospital Stay: Payer: Medicare HMO

## 2019-11-02 ENCOUNTER — Other Ambulatory Visit: Payer: Medicare HMO

## 2019-11-02 ENCOUNTER — Telehealth: Payer: Self-pay | Admitting: *Deleted

## 2019-11-02 ENCOUNTER — Inpatient Hospital Stay: Payer: Medicare HMO | Admitting: Nurse Practitioner

## 2019-11-02 ENCOUNTER — Ambulatory Visit: Payer: Medicare HMO | Admitting: Nurse Practitioner

## 2019-11-02 ENCOUNTER — Inpatient Hospital Stay: Payer: Medicare HMO | Admitting: Nutrition

## 2019-11-02 NOTE — Telephone Encounter (Signed)
Wife reports that he declines to come in today for treatment or even for office visit. He is having more abdominal pain with some tenderness to touch on the left abdomen. Had some nausea and vomited x 1 last night--none today. He has small bowel movements that look like pebbles and is passing gas OK. Denies any abdominal firmness or swelling. He had stopped taking all his bowel mediations. Instructed wife to get him back on his Miralax bid for now and give him a dulcolax tablet today. Can also try 1/2 bottle of magnesium citrate. Reminded them that the more pain med he takes the more likely to be constipated. He wishes to reschedule for next week. Informed her that RN will call and check on him tomorrow. Scheduling message sent.

## 2019-11-03 ENCOUNTER — Telehealth: Payer: Self-pay | Admitting: Oncology

## 2019-11-03 NOTE — Telephone Encounter (Signed)
Scheduled per 6/1 sch messsage. Pt aware of appts on 6/10.

## 2019-11-05 NOTE — Progress Notes (Signed)

## 2019-11-09 ENCOUNTER — Other Ambulatory Visit: Payer: Self-pay | Admitting: Nurse Practitioner

## 2019-11-09 DIAGNOSIS — C20 Malignant neoplasm of rectum: Secondary | ICD-10-CM

## 2019-11-09 MED ORDER — OXYCODONE-ACETAMINOPHEN 5-325 MG PO TABS: 1.0000 | ORAL_TABLET | ORAL | 0 refills | Status: AC | PRN

## 2019-11-10 ENCOUNTER — Other Ambulatory Visit: Payer: Medicare HMO

## 2019-11-10 ENCOUNTER — Ambulatory Visit: Payer: Medicare HMO | Admitting: Oncology

## 2019-11-11 ENCOUNTER — Inpatient Hospital Stay: Payer: Medicare HMO | Admitting: Nurse Practitioner

## 2019-11-11 ENCOUNTER — Telehealth: Payer: Self-pay

## 2019-11-11 ENCOUNTER — Inpatient Hospital Stay: Payer: Medicare HMO

## 2019-11-11 ENCOUNTER — Inpatient Hospital Stay: Payer: Medicare HMO | Attending: Nurse Practitioner

## 2019-11-11 NOTE — Telephone Encounter (Signed)
Left voicemail for patient to call back [facility] 

## 2019-11-11 NOTE — Telephone Encounter (Signed)
Spoke with pt's wife. She states that her husband does not want to come to [facility] at this time for appts. She states that he is losing weight, has a decreased appetite, has a hard time to remember to take his pain medication and and is having increased issues for standing for periods of time for showers. She is interested in a telephone office visit from MD Benay Spice and a referral for home nursing care/home health. This RN informed her that MD Benay Spice will be notified of her concerns and that MD Sherrill's office will f/u with her. Pt's wife appreciative of call back

## 2019-11-12 ENCOUNTER — Telehealth: Payer: Self-pay | Admitting: Oncology

## 2019-11-12 NOTE — Telephone Encounter (Signed)
Scheduled appt per 6/10 sch message - pt wife is aware of appts.

## 2019-11-15 ENCOUNTER — Telehealth: Payer: Self-pay | Admitting: *Deleted

## 2019-11-15 ENCOUNTER — Inpatient Hospital Stay (HOSPITAL_BASED_OUTPATIENT_CLINIC_OR_DEPARTMENT_OTHER): Payer: Medicare HMO | Admitting: Oncology

## 2019-11-15 DIAGNOSIS — C20 Malignant neoplasm of rectum: Secondary | ICD-10-CM | POA: Diagnosis not present

## 2019-11-15 MED ORDER — MORPHINE SULFATE (CONCENTRATE) 20 MG/ML PO SOLN: 10.0000 mg | ORAL | 0 refills | Status: AC | PRN

## 2019-11-15 NOTE — Telephone Encounter (Signed)
Called Hospice referral in to [facility] w[facility]. Metastatic rectal cancer, DNR. MD has ordered Roxanol for the home. Dr. Benay Spice will be attending. They will contact patient to schedule a visit today.

## 2019-11-15 NOTE — Progress Notes (Signed)
[place] OFFICE VISIT PROGRESS NOTE  I connected with on 11/15/19 at 12:00 PM EDT by telephone and verified that I am speaking with the correct person using two identifiers.   I discussed the limitations, risks, security and privacy concerns of performing an evaluation and management service by telemedicine and the availability of in-person appointments. I also discussed with the patient that there may be a patient responsible charge related to this service. The patient expressed understanding and agreed to proceed.  Other persons participating in the visit and their role in the encounter: Wife  Patient's location: Home Provider's location: Office   Diagnosis: Rectal cancer  INTERVAL HISTORY:   Hunter Walker is seen today for a telehealth visit.  This is due to his current medical condition, the Covid pandemic, and his personal preference. Hunter Walker was last treated with pembrolizumab on 10/11/2019.  He complains of anorexia, generalized weakness, lower abdominal pain, and pain with bowel movements.  His stools are liquid and "Crohn's ".  He has intermittent nausea and vomiting. Hunter Walker reports staying in bed for the majority of the day.  He is able to go to the bathroom on his own, but cannot dress himself.  He is dependent on his family to bring him food.    Lab Results:  Lab Results  Component Value Date   WBC 10.7 (H) 10/11/2019   HGB 10.2 (L) 10/11/2019   HCT 32.6 (L) 10/11/2019   MCV 92.6 10/11/2019   PLT 319 10/11/2019   NEUTROABS 8.1 (H) 10/11/2019     Medications: I have reviewed the patient's current medications.  Assessment/Plan:  1.  Rectal cancer, invasive adenocarcinoma confirmed at colonoscopy 05/22/2015, EUS staging 06/01/2015 revealed a uT3,N1 lesion at 7 cm from the anal verge  Staging CT abdomen/pelvis 05/15/2015 confirmed a mass at the rectosigmoid colon, a suspicious perirectal lymph node, and a  6 mm right liver lesion felt to most likely be a cyst  CT chest 05/30/2015 with indeterminate pulmonary nodules and multiple liver lesions suspicious for metastases  Ultrasound 06/13/2014 with no liver lesions identified  MRI abdomen 06/21/2015 consistent with multiple liver metastases  CT biopsy of liver lesion 06/26/2015. Pathology showed metastatic adenocarcinoma consistent with colonic primary.MSI-stable, low mutation burden, no RAS or BRAFmutation  Cycle 1 FOLFOX 07/10/2015  Cycle 2 FOLFOX plus Avastin 07/24/2015 with Neulasta support  Cycle 3 FOLFOX plus Avastin 08/07/2015  Cycle 4 FOLFOX plus Avastin 08/21/2015  cycle 5 FOLFOX plus Avastin 09/04/2015  Restaging CTs 09/15/2015 revealed a decrease in the size of liver lesions, decreased rectal primary, stable/decreased size of tiny lung nodules  Cycle 6 FOLFOX plus Avastin 09/18/2015  Cycle 7 FOLFOX plus Avastin 10/02/2015  Cycle 8 FOLFOX plus Avastin 10/16/2015  Cycle 9 FOLFOX plus Avastin 11/06/2015  Cycle 10 FOLFOX plus Avastin 11/20/2015  CTs 12/07/2015-mild decrease in wall thickening at the rectum, decrease in tiny hepatic metastases, stable lung nodules, no evidence of progressive metastatic disease, new left lower lobe infiltrate  Treatment changed to every three-week Avastin with every other week Xeloda beginning 12/11/2015  Restaging CTs 04/12/2016-interval increase in size of adjacent right upper lobe pulmonary nodules and right lower lobe pulmonary nodule; unchanged 5 mm lesion right hepatic lobe; no additional visualized hepatic metastasis. Grossly unchanged wall thickening of the rectum.  Continuation of Xeloda every other week and every three-week AvastinXeloda placed on hold 05/06/2016 due to hand-foot syndrome  Xeloda resumed 05/28/2016 1500 mg every morning and 1000 mg  every afternoon 7 days on/7 days off  CT 09/27/2016-enlargement of lung nodules and new liver lesions, stable rectal mass  Cycle 1  FOLFIRI/panitumumab 10/08/2016  Cycle 2 panitumumab 10/23/2016 (FOLFIRI held secondary to neutropenia)  Cycle 3 FOLFIRI/PANITUMUMAB 11/04/2016  Cycle 4 FOLFIRI/panitumumab 11/18/2016  Cycle 5 FOLFIRI/panitumumab 12/16/2016  Cycle 6 FOLFIRI/panitumumab 12/31/2016  CTs 01/16/2017-decreased size of pulmonary nodules and liver lesions. Decreased perirectal lymph node  Cycle 7 FOLFIRI/PANITUMUMAB 01/28/2017  Cycle 8FOLFIRI/PANITUMUMAB 02/10/2017  Cycle 9 FOLFIRI/PANITUMUMAB 02/24/2017  Cycle 10 FOLFIRI/panitumumab 03/10/2017 (irinotecan held secondary to neutropenia)  Cycle 11 FOLFIRI/panitumumab 03/24/2017  Cycle12 FOLFIRI/Panitumumab 04/07/2017  Restaging CTs 04/25/2017-stable rectal mass with adjacent 8 mm left perirectal nodal metastasis. Prior hepatic metastases not visualized on the current study. Indeterminate 16 mm enhancing lesion present in segment 6, indeterminate. Minimal residual nodularity in the right upper lobe. No new/progressive pulmonary metastases.  Maintenance Xeloda/panitumumab 04/28/2017  Restaging CTs 08/18/2017-new and enlarging bilateral lung nodules; 2 hyperattenuating lesions in the liver appear similar to prior study; progression of wall thickening in the rectum with enlarging perirectal lymph nodes.  Initiation of radiation/Xeloda 09/01/2017, completed 09/22/2017  Cycle 1 FOLFIRI/Panitumumab 10/13/2017  Cycle 2 FOLFIRI 11/03/2017  Cycle 3 FOLFIRI 11/17/2017  Cycle 4 FOLFIRI 12/01/2017  Cycle 5 FOLFIRI 12/15/2017  CTs7/26/2019-mild progression lungs and liver; significantly improved rectal wall thickening; resolution of perirectal nodal adenopathy.  Cycle 6 FOLFIRI 12/29/2017  Cycle 7 FOLFIRI 01/12/2018  Cycle 8 FOLFIRI 01/26/2018  Cycle 9 FOLFIRI 02/09/2018  Cycle 10 FOLFIRI 02/23/2018  Restaging CTs 03/06/2018-majority of the pulmonary nodules are similar in size, at least 2 of the nodules are minimally increased in size. Similar appearing  lesions within the liver. Similar-appearing mild wall thickening of the rectum.  Cycle 11FOLFIRI 03/09/2018  Cycle 12 FOLFIRI 03/23/2018  Cycle 13 FOLFIRI 04/06/2018  Cycle 14 FOLFIRI 04/20/2018  Cycle 15 FOLFIRI 05/04/2018  CT 05/15/2018-no change in pulmonary nodules, mild increase in size of 2 liver lesions, stable rectal wall thickening  On study at [facility], [place] with palbociclib andUlixertinibbeginning 07/15/2018  [facility]  Restaging CTs 10/07/2018-enlargement of multiple lateral pulmonary nodules and liver lesions; redemonstrated circumferential thickening of the mid rectum with interval enlargement of an irregular nodule of the left perirectal fat.  Cycle 1 FOLFOX 10/12/2018  Cycle 2 FOLFOX 11/02/2018 (allergic reaction to oxaliplatin, oxaliplatin discontinued)  Guardant testing 11/30/2018-KRAS Q61H,EGFR amplification, MSI high not detected(NOTE:Plan for repeat guardant testing at a 61-monthinterval)  Cycle 1 Lonsurf 01/11/2019  Cycle 2 Lonsurf 02/08/2019, dose reduced  Every 2-week Avastin beginning 02/09/2019  Cycle 3 Lonsurf 03/08/2019  Cycle 4 Lonsurf 04/12/2019  CTs 05/07/2019-significant disease progression in the liver, minimal increase in pulmonary metastatic disease, increase in left perirectal nodularity, persistent wall thickening in the lower rectum  Repeat guardant 360 testing 06/03/2019-K-rasQ61Halteration, tumor mutation burden 64, MSI-high not detected  Cycle 1 FOLFIRI 06/14/2019  Liver biopsy 06/22/2019-metastatic adenocarcinoma, Foundation 15-[id number] 13, MSS EGFRamplification  Cycle 2 FOLFIRI 07/05/2019  Cycle 3 FOLFIRI 07/19/2019  Cycle 4 FOLFIRI 08/02/2019  CT 09/13/2019-increased size of lung and liver metastases, increased rectal wall thickening, progressive left perirectal nodal metastasis  Cycle 1 pembrolizumab 09/17/2019  Cycle 2 pembrolizumab 10/11/2019 2.Multiple colon polyps on the colonoscopy 05/22/2015, tubular  villous adenoma and a tubular adenoma were removed 3.Anemia-likely secondary to rectal bleeding.  4.Family history of multiple cancers including breast and prostate cancer 5.Transient right arm numbness 06/20/2015 6. Port-A-Cath placement 07/06/2015 by Dr. TMarcello Moores7. Mild neutropenia secondary to chemotherapy following cycle 1 FOLFOX.  Neulasta added with cycle 2. 8. Delayed nausea following chemotherapy-emend added with cycle 3 FOLFOX;Emend added with cycle 12 FOLFIRI. 9. Early oxaliplatin neuropathy 10. 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 Xeloda resumed at a reduced dose. 11. Bilateral hand weakness. Question related to previous oxaliplatin. Referred to neurology 06/17/2016. 12. Hypertension-amlodipine started 07/08/2016 13. Rash secondary to PANITUMUMAB.  14. Neutropenia following cycle 1 FOLFIRI/PANITUMUMAB; neutropenia following cycle 4 FOLFIRI/PANITUMUMAB. 15.Neutropenia secondary to Lonsurf-cycle 4 of Lonsurf delayed until 04/12/2019    Disposition: Hunter Walker has metastatic rectal cancer.  He has completed 2 treatments with pembrolizumab.  His clinical status has declined.  Hunter Walker has a poor performance status.  This is likely secondary to progression of the metastatic rectal cancer.  I discussed treatment options with Hunter Walker and his wife.  He agrees to a comfort care approach.  He will enroll in Home hospice care.  We discussed CPR and ACLS issues.  He will be placed on a no CODE BLUE status.  Hunter Walker has pain that is not relieved with oxycodone.  He would like a liquid pain medication.  We prescribed Roxanol.  Hunter Walker will be scheduled for a video visit in approximately 2 weeks.   I discussed the assessment and treatment plan with the patient. The patient was provided an opportunity to ask questions and all were answered. The patient agreed with the plan and demonstrated an understanding of the instructions.   The  patient was advised to call back or seek an in-person evaluation if the symptoms worsen or if the condition fails to improve as anticipated.  I provided 25 minutes of video and documentation time during this encounter, and > 50% was spent counseling as documented under my assessment & plan.  Betsy Coder ANP/GNP-BC   11/15/2019 12:21 PM

## 2020-01-02 DEATH — deceased

## 2020-05-15 IMAGING — US US BIOPSY CORE LIVER
1 series · 13 of 25 positions shown · non-contrast
Comparison: none

INDICATION: Metastatic colon cancer to the liver

[Series 1: us biopsy core liver · 13 of 27 slices shown]
[im 1/27]
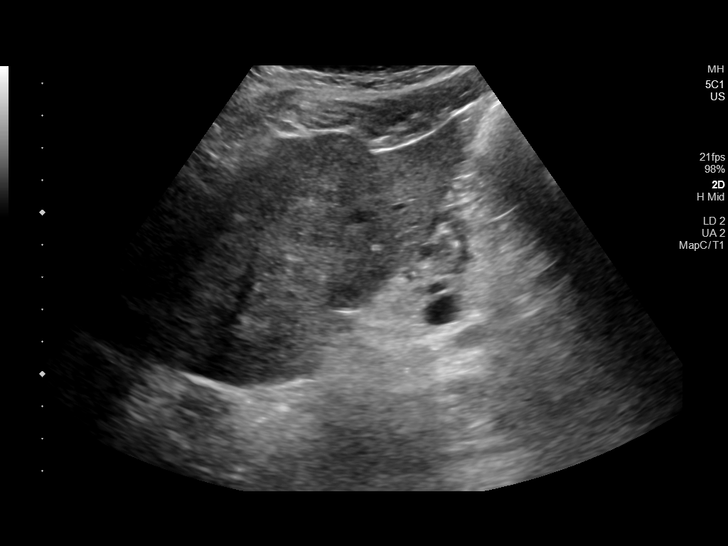
[im 3/27]
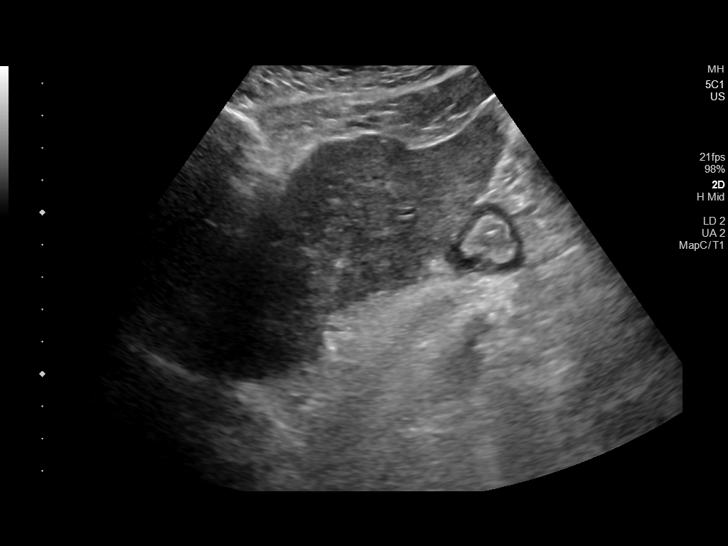
[im 5/27]
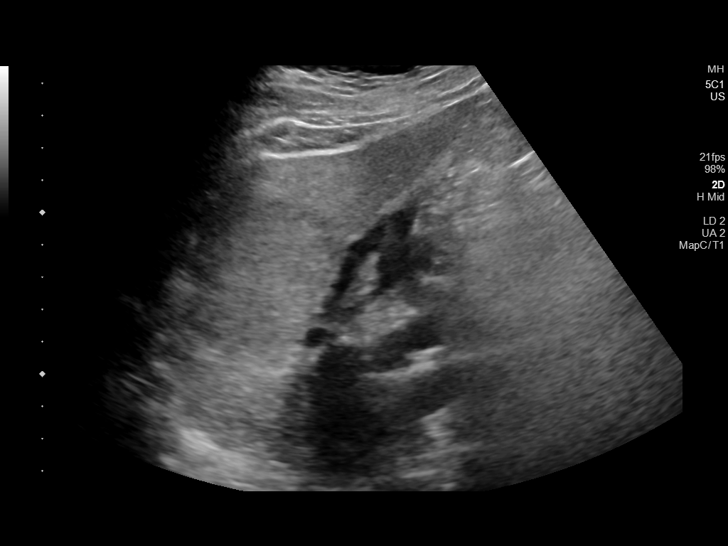
[im 7/27]
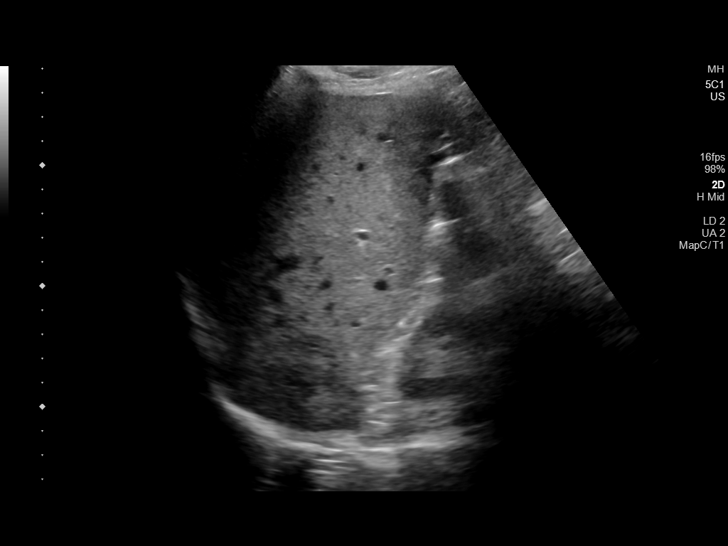
[im 9/27]
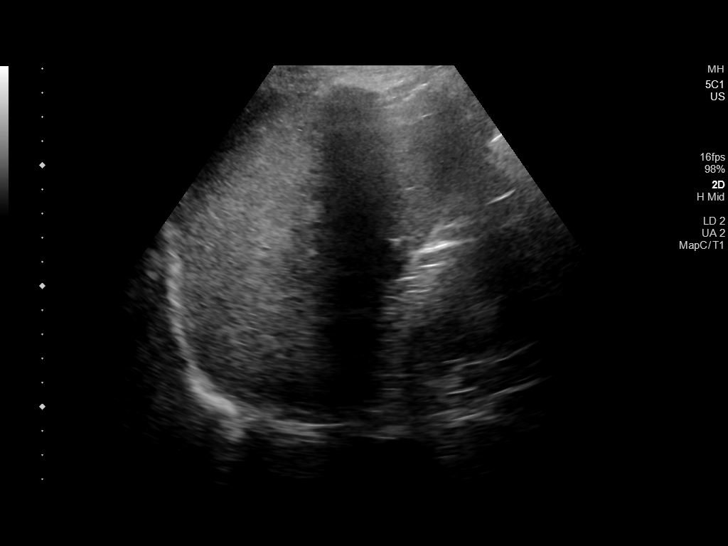
[im 11/27]
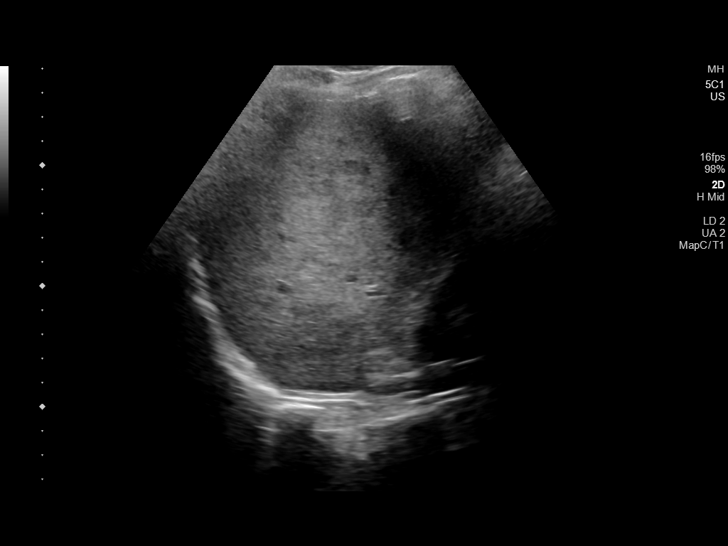
[im 14/27]
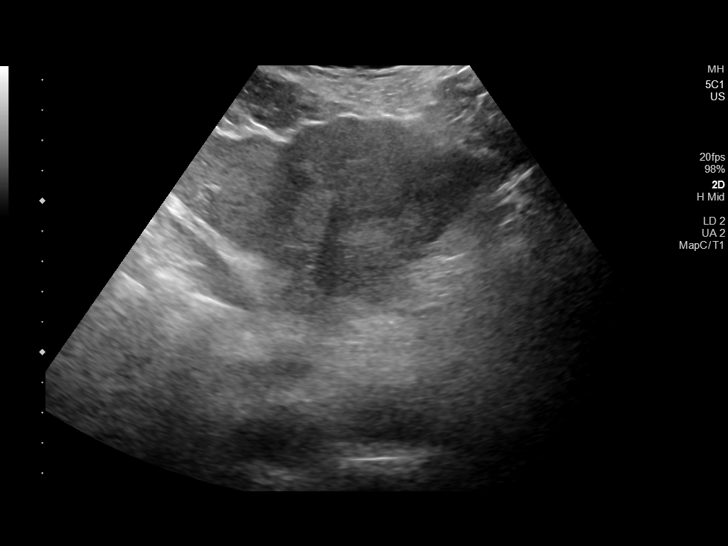
[im 16/27]
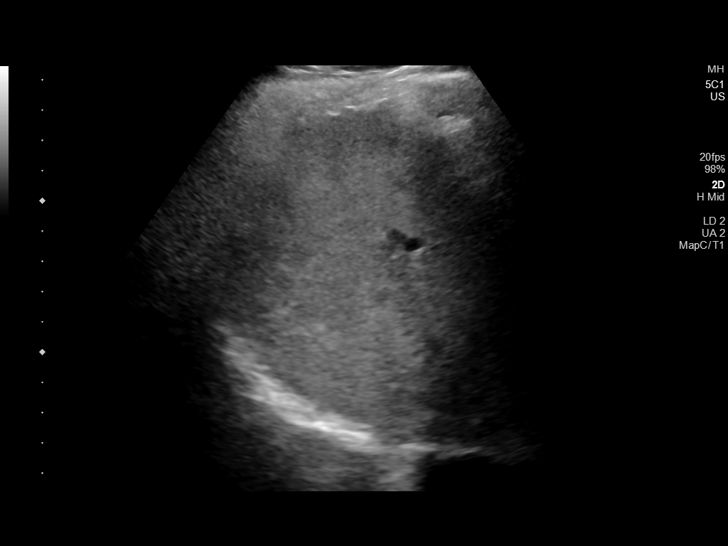
[im 18/27]
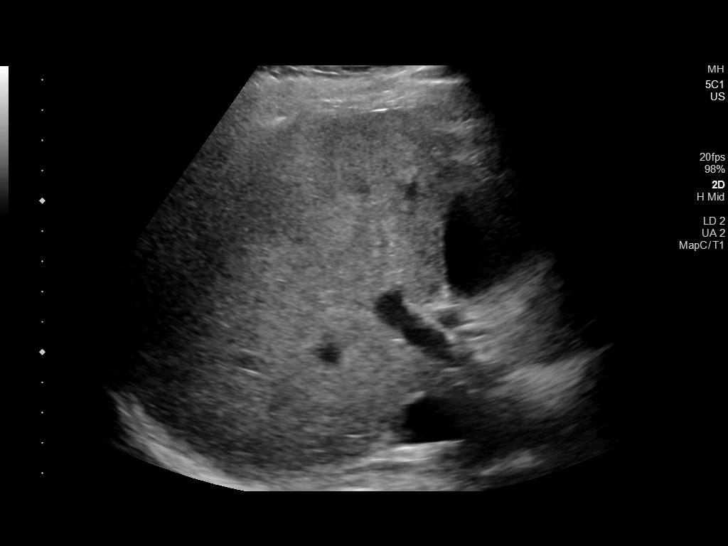
[im 20/27]
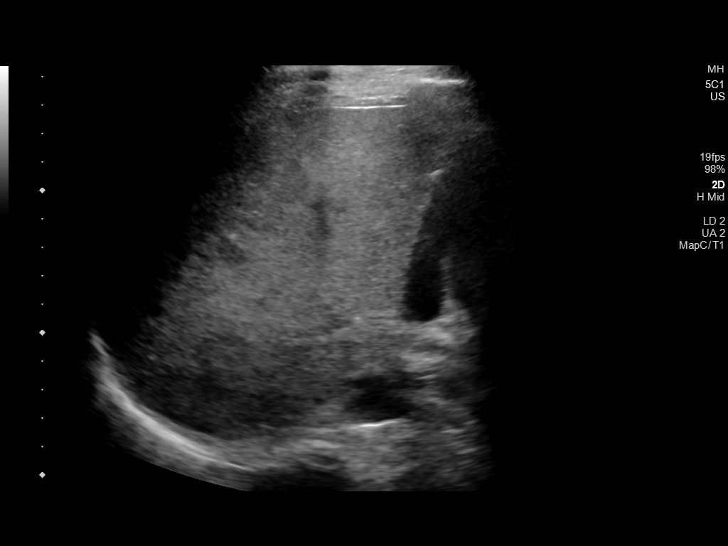
[im 22/27]
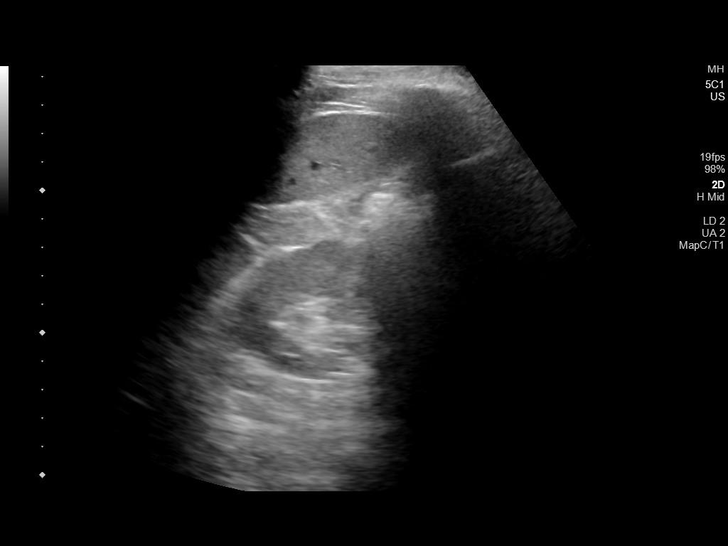
[im 24/27]
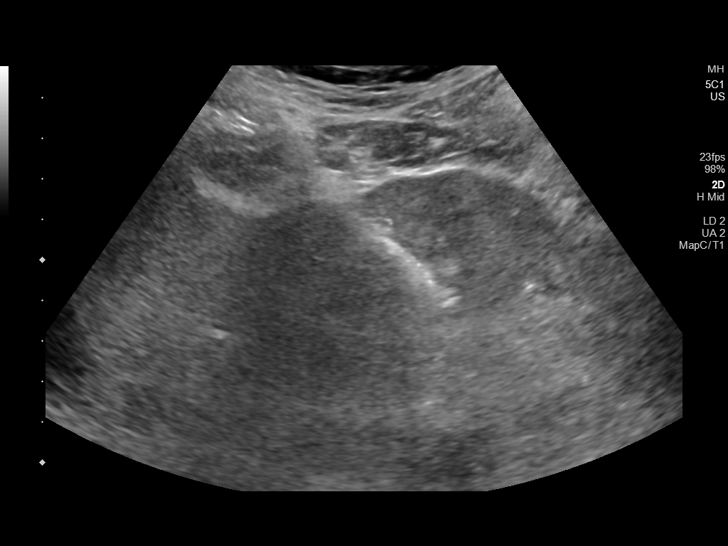
[im 27/27]
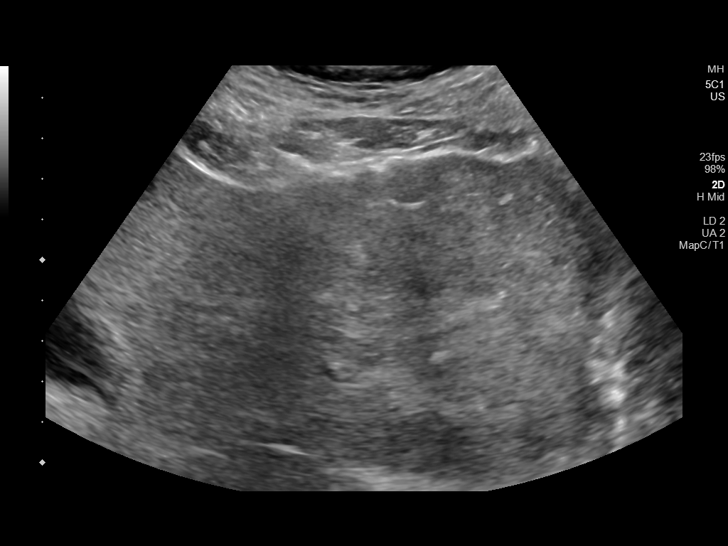

[13 of 25 positions shown; findings below may reference images not displayed]

EXAM:
ULTRASOUND GUIDED CORE BIOPSY OF LEFT HEPATIC METASTASIS

MEDICATIONS:
1% lidocaine

ANESTHESIA/SEDATION:
Versed 2.0mg IV; Fentanyl 06mcg IV;

Moderate Sedation Time:  12 minutes

The patient was continuously monitored during the procedure by the
interventional radiology nurse under my direct supervision.

FLUOROSCOPY TIME:  Fluoroscopy Time: None.

COMPLICATIONS:
None immediate.

PROCEDURE:
The procedure, risks, benefits, and alternatives were explained to
the patient. Questions regarding the procedure were encouraged and
answered. The patient understands and consents to the procedure.

Previous imaging reviewed. Preliminary ultrasound performed. The
left hepatic lobe lateral segment mass was localized and marked for
a subxiphoid approach.

Under sterile conditions and local anesthesia, a 17 gauge 6.8 cm
access was advanced from a subxiphoid approach into the lateral left
hepatic lesion. Needle position confirmed for documentation with
ultrasound. Images obtained for documentation. 3 18 gauge core
biopsies obtained. Samples placed in formalin. Needle tract occluded
with Gel-Foam. Patient tolerated biopsy well. No immediate
complication.
FINDINGS: Imaging confirms needle placed in the left hepatic metastasis for
core biopsy
IMPRESSION: Successful ultrasound left hepatic metastasis 18 gauge core biopsies
